# Patient Record
Sex: Male | Born: 1965 | Race: Black or African American | Hispanic: No | Smoking: Former smoker
Health system: Southern US, Community
[De-identification: ages and names within clinical notes are randomized; demographics above are authoritative.]

## PROBLEM LIST (undated history)

## (undated) DIAGNOSIS — I890 Lymphedema, not elsewhere classified: Secondary | ICD-10-CM

## (undated) DIAGNOSIS — I340 Nonrheumatic mitral (valve) insufficiency: Secondary | ICD-10-CM

## (undated) DIAGNOSIS — G4733 Obstructive sleep apnea (adult) (pediatric): Secondary | ICD-10-CM

## (undated) DIAGNOSIS — E785 Hyperlipidemia, unspecified: Secondary | ICD-10-CM

## (undated) DIAGNOSIS — Z954 Presence of other heart-valve replacement: Secondary | ICD-10-CM

## (undated) DIAGNOSIS — I4891 Unspecified atrial fibrillation: Secondary | ICD-10-CM

## (undated) DIAGNOSIS — J449 Chronic obstructive pulmonary disease, unspecified: Secondary | ICD-10-CM

## (undated) DIAGNOSIS — I252 Old myocardial infarction: Secondary | ICD-10-CM

## (undated) DIAGNOSIS — I87099 Postthrombotic syndrome with other complications of unspecified lower extremity: Secondary | ICD-10-CM

## (undated) DIAGNOSIS — I82409 Acute embolism and thrombosis of unspecified deep veins of unspecified lower extremity: Secondary | ICD-10-CM

## (undated) DIAGNOSIS — Z91030 Bee allergy status: Secondary | ICD-10-CM

## (undated) DIAGNOSIS — I5031 Acute diastolic (congestive) heart failure: Secondary | ICD-10-CM

## (undated) DIAGNOSIS — N179 Acute kidney failure, unspecified: Secondary | ICD-10-CM

## (undated) DIAGNOSIS — I2510 Atherosclerotic heart disease of native coronary artery without angina pectoris: Secondary | ICD-10-CM

## (undated) DIAGNOSIS — R911 Solitary pulmonary nodule: Secondary | ICD-10-CM

## (undated) HISTORY — DX: Atherosclerotic heart disease of native coronary artery without angina pectoris: I25.10

## (undated) HISTORY — DX: Postthrombotic syndrome with other complications of unspecified lower extremity: I87.099

## (undated) HISTORY — DX: Chronic obstructive pulmonary disease, unspecified: J44.9

## (undated) HISTORY — DX: Obstructive sleep apnea (adult) (pediatric): G47.33

## (undated) HISTORY — DX: Acute diastolic (congestive) heart failure: I50.31

## (undated) HISTORY — PX: COLONOSCOPY W/ BIOPSIES: SHX1374

## (undated) HISTORY — DX: Unspecified atrial fibrillation: I48.91

## (undated) HISTORY — DX: Hyperlipidemia, unspecified: E78.5

## (undated) HISTORY — DX: Bee allergy status: Z91.030

## (undated) HISTORY — DX: Solitary pulmonary nodule: R91.1

## (undated) HISTORY — DX: Nonrheumatic mitral (valve) insufficiency: I34.0

## (undated) HISTORY — DX: Acute embolism and thrombosis of unspecified deep veins of unspecified lower extremity: I82.409

## (undated) HISTORY — DX: Old myocardial infarction: I25.2

---

## 1998-03-28 ENCOUNTER — Encounter: Admission: RE | Admit: 1998-03-28 | Discharge: 1998-03-28 | Payer: Self-pay | Admitting: Internal Medicine

## 1998-10-31 ENCOUNTER — Encounter: Admission: RE | Admit: 1998-10-31 | Discharge: 1998-10-31 | Payer: Self-pay | Admitting: Internal Medicine

## 1998-12-05 ENCOUNTER — Encounter: Admission: RE | Admit: 1998-12-05 | Discharge: 1998-12-05 | Payer: Self-pay | Admitting: Internal Medicine

## 1999-07-24 ENCOUNTER — Encounter: Admission: RE | Admit: 1999-07-24 | Discharge: 1999-07-24 | Payer: Self-pay | Admitting: Internal Medicine

## 1999-12-07 ENCOUNTER — Encounter: Admission: RE | Admit: 1999-12-07 | Discharge: 1999-12-07 | Payer: Self-pay | Admitting: Internal Medicine

## 2000-01-23 ENCOUNTER — Emergency Department (HOSPITAL_COMMUNITY): Admission: EM | Admit: 2000-01-23 | Discharge: 2000-01-23 | Payer: Self-pay | Admitting: Emergency Medicine

## 2000-02-02 ENCOUNTER — Encounter: Admission: RE | Admit: 2000-02-02 | Discharge: 2000-02-02 | Payer: Self-pay | Admitting: Hematology and Oncology

## 2000-07-05 ENCOUNTER — Encounter: Admission: RE | Admit: 2000-07-05 | Discharge: 2000-07-05 | Payer: Self-pay | Admitting: Internal Medicine

## 2000-08-08 ENCOUNTER — Encounter: Admission: RE | Admit: 2000-08-08 | Discharge: 2000-08-08 | Payer: Self-pay | Admitting: Internal Medicine

## 2000-09-20 ENCOUNTER — Ambulatory Visit (HOSPITAL_COMMUNITY): Admission: RE | Admit: 2000-09-20 | Discharge: 2000-09-20 | Payer: Self-pay | Admitting: *Deleted

## 2000-11-28 ENCOUNTER — Encounter: Admission: RE | Admit: 2000-11-28 | Discharge: 2000-12-22 | Payer: Self-pay | Admitting: Internal Medicine

## 2001-06-23 ENCOUNTER — Encounter: Admission: RE | Admit: 2001-06-23 | Discharge: 2001-06-23 | Payer: Self-pay | Admitting: Internal Medicine

## 2001-06-30 ENCOUNTER — Ambulatory Visit (HOSPITAL_COMMUNITY): Admission: RE | Admit: 2001-06-30 | Discharge: 2001-06-30 | Payer: Self-pay | Admitting: Internal Medicine

## 2001-07-18 ENCOUNTER — Encounter: Admission: RE | Admit: 2001-07-18 | Discharge: 2001-07-18 | Payer: Self-pay | Admitting: Internal Medicine

## 2001-07-26 ENCOUNTER — Encounter: Admission: RE | Admit: 2001-07-26 | Discharge: 2001-07-26 | Payer: Self-pay | Admitting: Internal Medicine

## 2002-03-05 ENCOUNTER — Encounter: Admission: RE | Admit: 2002-03-05 | Discharge: 2002-03-05 | Payer: Self-pay | Admitting: Internal Medicine

## 2002-04-18 ENCOUNTER — Inpatient Hospital Stay (HOSPITAL_COMMUNITY): Admission: AD | Admit: 2002-04-18 | Discharge: 2002-04-20 | Payer: Self-pay | Admitting: Internal Medicine

## 2002-04-18 ENCOUNTER — Encounter: Admission: RE | Admit: 2002-04-18 | Discharge: 2002-04-18 | Payer: Self-pay | Admitting: Internal Medicine

## 2002-04-30 ENCOUNTER — Encounter: Admission: RE | Admit: 2002-04-30 | Discharge: 2002-04-30 | Payer: Self-pay | Admitting: Internal Medicine

## 2002-05-04 ENCOUNTER — Encounter: Admission: RE | Admit: 2002-05-04 | Discharge: 2002-07-17 | Payer: Self-pay | Admitting: Infectious Diseases

## 2002-11-12 ENCOUNTER — Encounter: Admission: RE | Admit: 2002-11-12 | Discharge: 2002-11-12 | Payer: Self-pay | Admitting: Internal Medicine

## 2003-11-21 ENCOUNTER — Encounter: Admission: RE | Admit: 2003-11-21 | Discharge: 2003-11-21 | Payer: Self-pay | Admitting: Internal Medicine

## 2004-03-02 ENCOUNTER — Encounter: Admission: RE | Admit: 2004-03-02 | Discharge: 2004-03-02 | Payer: Self-pay | Admitting: Internal Medicine

## 2004-07-24 ENCOUNTER — Encounter: Admission: RE | Admit: 2004-07-24 | Discharge: 2004-07-24 | Payer: Self-pay | Admitting: Internal Medicine

## 2005-01-22 ENCOUNTER — Ambulatory Visit: Payer: Self-pay | Admitting: Internal Medicine

## 2005-02-26 ENCOUNTER — Ambulatory Visit (HOSPITAL_BASED_OUTPATIENT_CLINIC_OR_DEPARTMENT_OTHER): Admission: RE | Admit: 2005-02-26 | Discharge: 2005-02-26 | Payer: Self-pay | Admitting: Internal Medicine

## 2005-03-07 ENCOUNTER — Ambulatory Visit: Payer: Self-pay | Admitting: Internal Medicine

## 2005-11-02 ENCOUNTER — Ambulatory Visit: Payer: Self-pay | Admitting: Internal Medicine

## 2006-03-10 ENCOUNTER — Ambulatory Visit: Payer: Self-pay | Admitting: Hospitalist

## 2006-04-04 ENCOUNTER — Ambulatory Visit (HOSPITAL_BASED_OUTPATIENT_CLINIC_OR_DEPARTMENT_OTHER): Admission: RE | Admit: 2006-04-04 | Discharge: 2006-04-04 | Payer: Self-pay | Admitting: General Surgery

## 2006-04-05 ENCOUNTER — Encounter (INDEPENDENT_AMBULATORY_CARE_PROVIDER_SITE_OTHER): Payer: Self-pay | Admitting: Specialist

## 2006-12-14 ENCOUNTER — Telehealth (INDEPENDENT_AMBULATORY_CARE_PROVIDER_SITE_OTHER): Payer: Self-pay | Admitting: Hospitalist

## 2006-12-14 DIAGNOSIS — F17200 Nicotine dependence, unspecified, uncomplicated: Secondary | ICD-10-CM

## 2006-12-14 DIAGNOSIS — G4733 Obstructive sleep apnea (adult) (pediatric): Secondary | ICD-10-CM

## 2006-12-14 DIAGNOSIS — I87099 Postthrombotic syndrome with other complications of unspecified lower extremity: Secondary | ICD-10-CM

## 2006-12-14 DIAGNOSIS — I872 Venous insufficiency (chronic) (peripheral): Secondary | ICD-10-CM | POA: Insufficient documentation

## 2006-12-14 DIAGNOSIS — E66811 Obesity, class 1: Secondary | ICD-10-CM | POA: Insufficient documentation

## 2006-12-14 DIAGNOSIS — E669 Obesity, unspecified: Secondary | ICD-10-CM

## 2006-12-14 DIAGNOSIS — L723 Sebaceous cyst: Secondary | ICD-10-CM

## 2006-12-14 HISTORY — DX: Postthrombotic syndrome with other complications of unspecified lower extremity: I87.099

## 2006-12-14 HISTORY — DX: Obstructive sleep apnea (adult) (pediatric): G47.33

## 2007-06-12 ENCOUNTER — Ambulatory Visit: Payer: Self-pay | Admitting: Hospitalist

## 2007-06-12 LAB — CONVERTED CEMR LAB
ALT: 14 units/L (ref 0–53)
AST: 16 units/L (ref 0–37)
Albumin: 3.9 g/dL (ref 3.5–5.2)
Alkaline Phosphatase: 61 units/L (ref 39–117)
BUN: 11 mg/dL (ref 6–23)
CO2: 22 meq/L (ref 19–32)
Calcium: 8.7 mg/dL (ref 8.4–10.5)
Chloride: 104 meq/L (ref 96–112)
Cholesterol: 174 mg/dL (ref 0–200)
Creatinine, Ser: 0.89 mg/dL (ref 0.40–1.50)
Glucose, Bld: 81 mg/dL (ref 70–99)
HDL: 42 mg/dL (ref >39)
LDL Cholesterol: 114 mg/dL — ABNORMAL HIGH (ref 0–99)
Potassium: 4.4 meq/L (ref 3.5–5.3)
Sodium: 139 meq/L (ref 135–145)
Total Bilirubin: 0.7 mg/dL (ref 0.3–1.2)
Total CHOL/HDL Ratio: 4.1
Total Protein: 6.3 g/dL (ref 6.0–8.3)
Triglycerides: 89 mg/dL (ref <150)
VLDL: 18 mg/dL (ref 0–40)

## 2008-07-31 ENCOUNTER — Telehealth: Payer: Self-pay | Admitting: Internal Medicine

## 2008-10-10 ENCOUNTER — Encounter (INDEPENDENT_AMBULATORY_CARE_PROVIDER_SITE_OTHER): Payer: Self-pay | Admitting: *Deleted

## 2008-10-10 ENCOUNTER — Ambulatory Visit: Payer: Self-pay | Admitting: *Deleted

## 2009-05-15 ENCOUNTER — Emergency Department (HOSPITAL_COMMUNITY): Admission: EM | Admit: 2009-05-15 | Discharge: 2009-05-15 | Payer: Self-pay | Admitting: Emergency Medicine

## 2010-02-27 ENCOUNTER — Ambulatory Visit: Payer: Self-pay | Admitting: Internal Medicine

## 2010-03-06 ENCOUNTER — Ambulatory Visit: Payer: Self-pay | Admitting: Internal Medicine

## 2010-03-09 LAB — CONVERTED CEMR LAB
Cholesterol: 180 mg/dL (ref 0–200)
HDL: 48 mg/dL (ref >39)
LDL Cholesterol: 123 mg/dL — ABNORMAL HIGH (ref 0–99)
Total CHOL/HDL Ratio: 3.8
Triglycerides: 44 mg/dL (ref <150)
VLDL: 9 mg/dL (ref 0–40)

## 2010-09-04 ENCOUNTER — Ambulatory Visit: Payer: Self-pay | Admitting: Internal Medicine

## 2010-12-31 NOTE — Assessment & Plan Note (Signed)
Summary: EST-CK/FU/MEDS/CFB   Vital Signs:  Patient profile:   45 year old male Height:      68 inches (172.72 cm) Weight:      270 pounds (122.73 kg) BMI:     41.20 Temp:     97.8 degrees F (36.56 degrees C) oral Pulse rate:   64 / minute BP sitting:   120 / 74  (right arm)  Vitals Entered By: Angelina Ok RN (September 04, 2010 11:22 AM) Is Patient Diabetic? No Pain Assessment Patient in pain? no      Nutritional Status BMI of > 30 = obese  Have you ever been in a relationship where you felt threatened, hurt or afraid?No   Does patient need assistance? Functional Status Self care Ambulation Normal Comments Check up.  Recheck leg.   Primary Care Provider:  Bethel Born MD   History of Present Illness: 45 y/o man wit hHLD,chiornic lymphedema with h/o cellutlits comes to the clinc for follow up  no cmplaints. has not had cellulitis in a while.   Depression History:      The patient denies a depressed mood most of the day and a diminished interest in his usual daily activities.         Preventive Screening-Counseling & Management  Alcohol-Tobacco     Smoking Status: current     Smoking Cessation Counseling: yes     Year Started: 2005 / one pack every two and half days  Comments: Cutting back.  Current Medications (verified): 1)  Futuro Firm Compression Hose  Misc (Elastic Bandages & Supports) .... Compression Stockings, One Medi Open Toe Calf, 40-50 Mm Hg Size 7 and One Medi Open Toe Calf Size 6, 30-40 Mm Hg 2)  Amoxicillin 875 Mg Tabs (Amoxicillin) .... Take One Pill Two Times A Day X 10 Days  Allergies (verified): No Known Drug Allergies  Review of Systems  The patient denies anorexia, fever, weight loss, weight gain, vision loss, decreased hearing, hoarseness, chest pain, syncope, dyspnea on exertion, peripheral edema, prolonged cough, headaches, hemoptysis, abdominal pain, melena, hematochezia, severe indigestion/heartburn, hematuria, incontinence,  genital sores, muscle weakness, suspicious skin lesions, transient blindness, difficulty walking, depression, unusual weight change, abnormal bleeding, enlarged lymph nodes, angioedema, breast masses, and testicular masses.    Physical Exam  General:  NAD Head:  normocephalic and atraumatic.   Ears:  External ear exam shows no significant lesions or deformities.  Otoscopic examination reveals clear canals, tympanic membranes are intact bilaterally without bulging, retraction, inflammation or discharge. Hearing is grossly normal bilaterally.R ear normal and L ear normal.   Nose:  no external deformity.   Neck:  No deformities, masses, or tenderness noted. Lungs:  Normal respiratory effort, chest expands symmetrically. Lungs are clear to auscultation, no crackles or wheezes. Heart:  Normal rate and regular rhythm. S1 and S2 normal without gallop, murmur, click, rub or other extra sounds. Abdomen:  Bowel sounds positive,abdomen soft and non-tender without masses, organomegaly or hernias noted. Msk:  No deformity or scoliosis noted of thoracic or lumbar spine.   Pulses:  R radial normal and L radial normal.   Extremities:  Left lower leg chronic skin changes. Dilated anterior vein on left shin. Slightly larger LLE than RLE. Swollen toes on left foot associated with prior DVT. No current erythema or palpable warmness.  has hose stocking on beth legs Neurologic:  alert & oriented X3, cranial nerves II-XII intact, strength normal in all extremities, sensation intact to light touch, sensation intact to pinprick,  and gait normal.     Impression & Recommendations:  Problem # 1:  OBSTRUCTIVE SLEEP APNEA (ICD-327.23) conitnue to wear the mask. no compaints  Problem # 2:  HYPERLIPIDEMIA (ICD-272.4) not any Rx. acceptable LDL.   plan -FLP next visit ( not fasting today) - diet control and lifestyle changes ( counselled)   Problem # 3:  LYMPHEDEMA (ICD-457.1) conitnue TED hose. no infection at  this time  Complete Medication List: 1)  Futuro Firm Compression Hose Misc (Elastic bandages & supports) .... Compression stockings, one medi open toe calf, 40-50 mm hg size 7 and one medi open toe calf size 6, 30-40 mm hg 2)  Amoxicillin 875 Mg Tabs (Amoxicillin) .... Take one pill two times a day x 10 days  Patient Instructions: 1)  Please schedule a follow-up appointment as needed. 2)  Stop Smoking Tips: Choose a Quit date. Cut down before the Quit date. decide what you will do as a substitute when you feel the urge to smoke(gum,toothpick,exercise). 3)  It is important that you exercise regularly at least 20 minutes 5 times a week. If you develop chest pain, have severe difficulty breathing, or feel very tired , stop exercising immediately and seek medical attention.  Prevention & Chronic Care Immunizations   Influenza vaccine: Historical  (11/02/2005)   Influenza vaccine deferral: Deferred  (02/27/2010)    Tetanus booster: Not documented   Td booster deferral: Refused  (09/04/2010)    Pneumococcal vaccine: Not documented  Other Screening   Smoking status: current  (09/04/2010)   Smoking cessation counseling: yes  (09/04/2010)  Lipids   Total Cholesterol: 180  (03/06/2010)   LDL: 123  (03/06/2010)   LDL Direct: Not documented   HDL: 48  (03/06/2010)   Triglycerides: 44  (03/06/2010)    SGOT (AST): 16  (06/12/2007)   SGPT (ALT): 14  (06/12/2007)   Alkaline phosphatase: 61  (06/12/2007)   Total bilirubin: 0.7  (06/12/2007)    Lipid flowsheet reviewed?: Yes   Progress toward LDL goal: Unchanged  Self-Management Support :   Personal Goals (by the next clinic visit) :      Personal LDL goal: 100  (02/27/2010)    Patient will work on the following items until the next clinic visit to reach self-care goals:     Medications and monitoring: take my medicines every day, bring all of my medications to every visit  (09/04/2010)     Eating: drink diet soda or water instead of  juice or soda, eat more vegetables, use fresh or frozen vegetables, eat foods that are low in salt, eat baked foods instead of fried foods, eat fruit for snacks and desserts, limit or avoid alcohol  (09/04/2010)     Activity: take a 30 minute walk every day  (09/04/2010)    Lipid self-management support: Written self-care plan, Education handout, Pre-printed educational material, Resources for patients handout  (09/04/2010)   Lipid self-care plan printed.   Lipid education handout printed      Resource handout printed.   Nursing Instructions: Give Flu vaccine today     Vital Signs:  Patient profile:   45 year old male Height:      68 inches (172.72 cm) Weight:      270 pounds (122.73 kg) BMI:     41.20 Temp:     97.8 degrees F (36.56 degrees C) oral Pulse rate:   64 / minute BP sitting:   120 / 74  (right arm)  Vitals Entered By: Venita Sheffield  Herbin RN (September 04, 2010 11:22 AM)

## 2010-12-31 NOTE — Assessment & Plan Note (Signed)
Summary: ACUTE-OUT OF MEDICATIONS/NEEDS NEEDS NEW MASK FOR CI PAP MACH...   Vital Signs:  Patient profile:   45 year old male Height:      68 inches (172.72 cm) Weight:      276.2 pounds (123.41 kg) BMI:     42.15 Temp:     96.9 degrees F (36.06 degrees C) oral Pulse rate:   61 / minute BP sitting:   123 / 75  (right arm) Cuff size:   LAREGE  Vitals Entered By: Theotis Barrio NT II (February 27, 2010 2:41 PM) CC: MEDICATION REFILL   /  NEED RX FOR TED HOSE FOR A YEAR Is Patient Diabetic? No Pain Assessment Patient in pain? no      Nutritional Status BMI of > 30 = obese  Have you ever been in a relationship where you felt threatened, hurt or afraid?No   Does patient need assistance? Functional Status Self care Ambulation Normal Comments MEDICATION REFILL AND NEED RX FOR TED HOSE FOR A YEAR   Primary Care Provider:  Bethel Born MD  CC:  MEDICATION REFILL   /  NEED RX FOR TED HOSE FOR A YEAR.  History of Present Illness: 45 yr old is here after long hiatrus for his yearly prescriptions. No complains.  Reports no problem with cellulitis.  He has cut down on smoking but not ready to quit. He is going on vacation to [place] [employer] and he likes to have antibiotic with him in case his cellulitis flares up. He also needs his yearly supply of stocking for leg edema.   Preventive Screening-Counseling & Management  Alcohol-Tobacco     Smoking Status: current     Smoking Cessation Counseling: yes     Year Started: 2005 / one pack every two and half days  Caffeine-Diet-Exercise     Does Patient Exercise: yes     Type of exercise: mowing /walking     Times/week: 2  Current Medications (verified): 1)  Futuro Firm Compression Hose  Misc (Elastic Bandages & Supports) .... Compression Stockings, One Medi Open Toe Calf, 40-50 Mm Hg Size 7 and One Medi Open Toe Calf Size 6, 30-40 Mm Hg 2)  Amoxicillin 875 Mg Tabs (Amoxicillin) .... Take One Pill Two Times A Day X 10 Days  Allergies  (verified): No Known Drug Allergies  Past History:  Past Medical History: Last updated: 12/14/2006 DVT, hx of - left leg - 1993 -  s/p recurrent cellulitis left leg -  s/p chronic lymphedema -  wears compression stockings Hyperlipidemia- diet controlled right foot callus tinea pedis -recurrent. Uses clotrimazole 1%BID x 14-21 days when recurs tobacco abuse- quit 2000 but relapsed and now one pack every 2 1/2 d. Couldn't afford Wellbutrin in past obesity- Ht 5'8 and wt 260- BMI 39.5 obstructive sleep apnea on CPAP sebaceous/epidermoid cyst anterior chest wall 1.5cm- [facility], [facility] 4/07  Risk Factors: Exercise: yes (02/27/2010)  Risk Factors: Smoking Status: current (02/27/2010)  Review of Systems      See HPI  Physical Exam  General:  NAD Head:  normocephalic and atraumatic.   Eyes:  No corneal or conjunctival inflammation noted. EOMI. Perrla.  Vision grossly normal. Ears:  External ear exam shows no significant lesions or deformities.  Otoscopic examination reveals clear canals, tympanic membranes are intact bilaterally without bulging, retraction, inflammation or discharge. Hearing is grossly normal bilaterally. Mouth:  Oral mucosa and oropharynx without lesions or exudates.  Teeth in good repair. Neck:  No deformities, masses, or tenderness  noted. Lungs:  Normal respiratory effort, chest expands symmetrically. Lungs are clear to auscultation, no crackles or wheezes. Heart:  Normal rate and regular rhythm. S1 and S2 normal without gallop, murmur, click, rub or other extra sounds. Abdomen:  Bowel sounds positive,abdomen soft and non-tender without masses, organomegaly or hernias noted. Msk:  No deformity or scoliosis noted of thoracic or lumbar spine.   Extremities:  Left lower leg chronic skin changes. Dilated anterior vein on left shin. Slightly larger LLE than RLE. Swollen toes on left foot associated with prior DVT. No current erythema or palpable warmness. Skin:  erythema  and papular rash particularly over the left side of his back and on his left arm where he was stung by hornets. No sign of infection.  Psych:  Cognition and judgment appear intact. Alert and cooperative with normal attention span and concentration. No apparent delusions, illusions, hallucinations   Impression & Recommendations:  Problem # 1:  CELLULITIS, LEG, LEFT (ICD-682.6) will give amoxicillin prescription for "in case of cellulitis" while away on vacation needs. NO sings of ongoing cellulitis right now.  His updated medication list for this problem includes:    Amoxicillin 875 Mg Tabs (Amoxicillin) .Marland Kitchen... Take one pill two times a day x 10 days  Problem # 2:  TOBACCO ABUSE (ICD-305.1) Assessment: Improved could not afford chantix. Never used. He smokes 8 cig a day. He known he needs to quit cold [place]. but he is not ready.  The following medications were removed from the medication list:    Chantix Starting Month Pak 0.5 Mg X 11 & 1 Mg X 42 Misc (Varenicline tartrate)    Chantix Continuing Month Pak 1 Mg Tabs (Varenicline tartrate)  Problem # 3:  OBSTRUCTIVE SLEEP APNEA (ICD-327.23) wears mask every day. going to refit on this week.   Problem # 4:  HYPERLIPIDEMIA (ICD-272.4) will recheck ldl, he may need to go on statin if still elevated.  Future Orders: T-Lipid Profile [phone]) ... 03/06/2010  Problem # 5:  OBESITY (ICD-278.00) talked about various complication related to obesity and methods to decrease it. He voices understanding.   Complete Medication List: 1)  Futuro Firm Compression Hose Misc (Elastic bandages & supports) .... Compression stockings, one medi open toe calf, 40-50 mm hg size 7 and one medi open toe calf size 6, 30-40 mm hg 2)  Amoxicillin 875 Mg Tabs (Amoxicillin) .... Take one pill two times a day x 10 days  Patient Instructions: 1)  Please schedule a follow-up appointment in 6 months. 2)  Get your fasting lipid panel done on coming friday. Dont eat  or drink any food after midnight of thursday.  3)  Tobacco is very bad for your health and your loved ones! You Should stop smoking!. 4)  Stop Smoking Tips: Choose a Quit date. Cut down before the Quit date. decide what you will do as a substitute when you feel the urge to smoke(gum,toothpick,exercise). Prescriptions: FUTURO FIRM COMPRESSION HOSE  MISC (ELASTIC BANDAGES & SUPPORTS) compression stockings, one Medi open toe calf, 40-50 mm Hg size 7 and one Medi open toe calf size 6, 30-40 mm Hg  #1 yr supply x 0   Entered and Authorized by:   Clerance Lav MD   Signed by:   Clerance Lav MD on 02/27/2010   Method used:   Print then Give to Patient   RxID:   [id number] AMOXICILLIN 875 MG TABS (AMOXICILLIN) Take one pill two times a day x 10 days  #20 x 1  Entered and Authorized by:   Clerance Lav MD   Signed by:   Clerance Lav MD on 02/27/2010   Method used:   Electronically to        [facility] [address] [phone]* (retail)       [address]       [place], [place]  [address]       Ph: [phone] or [phone]       Fax: [phone]   RxID:   [id number] FUTURO FIRM COMPRESSION HOSE  MISC (ELASTIC BANDAGES & SUPPORTS) compression stockings, one Medi open toe calf, 40-50 mm Hg size 7 and one Medi open toe calf size 6, 30-40 mm Hg  #1 yr supply x 0   Entered and Authorized by:   Clerance Lav MD   Signed by:   Clerance Lav MD on 02/27/2010   Method used:   Faxed to ...       [facility] [address] [phone]* (retail)       [address]       [place], [place]  [address]       Ph: [phone] or [phone]       Fax: [phone]   RxID:   [id number]  Process Orders Check Orders Results:     [facility]: ABN not required for this insurance Tests Sent for requisitioning (February 27, 2010 4:44 PM):     03/06/2010: [facility] -- T-Lipid Profile [id number] (signed)    Prevention & Chronic  Care Immunizations   Influenza vaccine: Historical  (11/02/2005)   Influenza vaccine deferral: Deferred  (02/27/2010)    Tetanus booster: Not documented   Td booster deferral: Deferred  (02/27/2010)    Pneumococcal vaccine: Not documented  Other Screening   Smoking status: current  (02/27/2010)   Smoking cessation counseling: yes  (02/27/2010)  Lipids   Total Cholesterol: 174  (06/12/2007)   LDL: 114  (06/12/2007)   LDL Direct: Not documented   HDL: 42  (06/12/2007)   Triglycerides: 89  (06/12/2007)    SGOT (AST): 16  (06/12/2007)   SGPT (ALT): 14  (06/12/2007)   Alkaline phosphatase: 61  (06/12/2007)   Total bilirubin: 0.7  (06/12/2007)    Lipid flowsheet reviewed?: Yes   Progress toward LDL goal: Unchanged  Self-Management Support :   Personal Goals (by the next clinic visit) :      Personal LDL goal: 100  (02/27/2010)    Patient will work on the following items until the next clinic visit to reach self-care goals:     Eating: drink diet soda or water instead of juice or soda, eat more vegetables, use fresh or frozen vegetables, eat foods that are low in salt, eat baked foods instead of fried foods, eat fruit for snacks and desserts, limit or avoid alcohol  (02/27/2010)     Activity: take a 30 minute walk every day  (02/27/2010)    Lipid self-management support: Resources for patients handout, Written self-care plan  (02/27/2010)   Lipid self-care plan printed.      Resource handout printed.

## 2011-01-11 ENCOUNTER — Encounter: Payer: Self-pay | Admitting: Internal Medicine

## 2011-04-16 NOTE — Op Note (Signed)
NAME:  Oscar Brown, Oscar Brown NO.:  [id number]   MEDICAL RECORD NO.:  [id number]          PATIENT TYPE:  AMB   LOCATION:  DSC                          FACILITY:  [facility]   PHYSICIAN:  Angelia Mould. Derrell Lolling, M.D.DATE OF BIRTH:  November 02, 1966   DATE OF PROCEDURE:  04/04/2006  DATE OF DISCHARGE:                                 OPERATIVE REPORT      Angelia Mould. Derrell Lolling, M.D.  Electronically Signed     HMI/MEDQ  D:  04/04/2006  T:  04/05/2006  Job:  [id number]

## 2011-04-16 NOTE — Op Note (Signed)
NAME:  Oscar Brown, Oscar Brown NO.:  [id number]   MEDICAL RECORD NO.:  [id number]          PATIENT TYPE:  AMB   LOCATION:  DSC                          FACILITY:  [facility]   PHYSICIAN:  Angelia Mould. Derrell Lolling, M.D.DATE OF BIRTH:  November 27, 1966   DATE OF PROCEDURE:  04/04/2006  DATE OF DISCHARGE:                                 OPERATIVE REPORT   PREOPERATIVE DIAGNOSIS:  A 1.5 cm soft tissue mass of the right anterior  chest wall.   POSTOPERATIVE DIAGNOSIS:  A 1.5 cm soft tissue mass of the right anterior  chest wall.   OPERATION PERFORMED:  Excision of a 1.5 cm soft tissue mass, right anterior  chest wall.   SURGEON:  Angelia Mould. Derrell Lolling, M.D.   ANESTHESIA:  Local.   INDICATIONS FOR PROCEDURE:  The patient is a 45 year old black man who has  developed an enlarging soft tissue mass of the right anterior chest wall  overlying the pectoralis major muscle.  It is becoming irritated, but has  never become infected.  On exam, this is a 1.5 cm smooth, rounded, firm,  cystic-appearing lesion on the anterior chest wall consistent with an  epidermoid cyst.  He wanted to have this excised and he was brought to the  operating room electively.   DESCRIPTION OF PROCEDURE:  The patient was brought to the minor procedure  room at [facility]. He was placed supine on the operating  table and the right chest was prepped and draped in sterile fashion.  1%  Xylocaine with epinephrine was used as local infiltration anesthetic.  A  transverse elliptical incision was made and the soft tissue mass was  dissected away from the surrounding subcutaneous adipose tissue without any  problem.  The mass was sent for routine histology.  Hemostasis was  excellent.  The wound was irrigated with saline.  The skin was closed with  multiple interrupted sutures of 3-0 and 4-0 nylon.  Clean bandages were  placed and the patient was taken to recovery room in stable condition.   ESTIMATED BLOOD  LOSS:  About 2 mL.   COMPLICATIONS:  None.   Sponge, needle and instrument counts were correct.      Angelia Mould. Derrell Lolling, M.D.  Electronically Signed     HMI/MEDQ  D:  04/04/2006  T:  04/05/2006  Job:  [id number]   cc:   Eliseo Gum, M.D.  Fax: [phone]

## 2011-04-16 NOTE — Discharge Summary (Signed)
[facility]  Patient:    Oscar Brown, Oscar Brown Visit Number: [id number] MRN: [id number]          Service Type: MED Location: [id number] Attending Physician:  Karn Pickler Dictated by:   Jennette Kettle, M.D. Admit Date:  04/18/2002 Discharge Date: 04/20/2002                             Discharge Summary  DISCHARGE DIAGNOSES: 1. Left lower extremity cellulitis. 2. History of chronic left edema, left greater than right dating back    to the 1980s, likely predisposing the patient to #1 above. 3. History of deep venous thrombosis in the left lower extremity approximately    two years ago. 4. History of recurrent cellulitis in the left lower extremity approximately    two years ago. 5. History of left lower leg fracture as a teenager. 6. History of right wrist fracture as a teenager. 7. Leukocytosis secondary to #1 during current hospitalization.  DISCHARGE MEDICATIONS: 1. Augmentin 875 mg 1 tablet p.o. b.i.d. to complete a 10-day course. 2. Tylenol 325 mg 1-2 tablets q.6h. p.r.n. pain.  FOLLOWUP:  The patient has a followup appointment with his primary care doctor, Dr. Debara Pickett, on Monday, June 2, at 2 p.m.  At this time, further re-evaluation of the patients left lower extremity cellulitis/lymphedema.  If Dr. Sharl Ma is not available, the patient may be seen in [facility] with Dr. Jennette Kettle.  Follow up the patients pending blood cultures at the time of discharge.  PROCEDURES/DIAGNOSTIC STUDIES:  On Apr 18, 2002, the patient had lower extremity Dopplers which were negative for DVT, SVT, or Baker cyst.  He was noted to have an incidental finding of a superficial inguinal lymph node enlargement in the groin area.  ADMISSION HISTORY AND PHYSICAL:  Briefly, Oscar Brown is a 45 year old African American male with a history of chronic lymphedema, recurrent DVTs, left lower extremity cellulitis, who presented on May 21, to the [facility] with a 3-4  day history of increased swelling, erythema, and warmth associated with subjective fever at home.  The patient denied any nausea, vomiting, headache, vision changes, abdominal discomfort, or trauma to his lower extremity.  The patient had no associated chest pain or shortness of breath.  The patient did suggest he had positive chills at home.  The has a history of a broken left lower extremity fracture as a teenager, but no recent trauma to that area.  ADMISSION LABORATORIES:  The patients CBC on admission revealed a white count of 26.3, hemoglobin 14.5, platelets of 166 with a left shift at 95% neutrophils with an absolute neutrophil count of 24.9.  The patients electrolytes revealed a sodium of 137, potassium 3.6, chloride 102, bicarb 25, glucose 116, BUN 8, creatinine 1.3.  The patients LFTs revealed a bilirubin 0.9, alkaline phosphatase 53, AST 24, ALT 18, protein 6.0, albumin 3.5, calcium 9.2.  The patient did have blood cultures at the time of admission x2, which were subsequently negative, yet pending evaluation.  HOSPITAL COURSE: #1 - LEFT LOWER EXTREMITY CELLULITIS:  The patient was initially admitted from the [facility] for further treatment of the patients cellulitis.  The patient is nOT a diabetic.  The patient did have lower extremity Dopplers performed with the results being negative for DVT.  The patient was placed on Ancef and later changed to p.o. Augmentin prior to discharge.  The patient did have a white blood cell count elevated  at 26,300 with a left shift.  Blood cultures were obtained x2.  These were subsequently negative, yet pending final evaluation. The patients CBC, at the time of discharge, had a decreased white cell count of 13,700.  The patient initially had a temperature upon admission to the hospital at 102.7.  This later defervesced and the patient was afebrile for 24-48 hours prior to discharge.  The patient was able to ambulate without any assistance.  The  patient was discharged home on Augmentin with followup in the internal medicine clinic with the patient primary care doctor.  If the patients primary care doctor is not available, he may be seen in [facility] by Dr. Erich Montane.  DISCHARGE LABORATORIES:  The patient had electrolytes, at the time of discharge, revealing sodium 140, potassium 3.9, chloride 106, bicarb 27, glucose 113, BUN 9, creatinine 1.0, calcium of 9.3.  The patients CBC revealed a white count of 13.7, hemoglobin 14.4, platelet count of 153. Dictated by:   Jennette Kettle, M.D. Attending Physician:  Karn Pickler DD:  04/20/02 TD:  04/24/02 Job: [id number]

## 2011-04-16 NOTE — Procedures (Signed)
NAME:  Oscar Brown, Oscar Brown NO.:  [id number]   MEDICAL RECORD NO.:  [id number]          PATIENT TYPE:  OUT   LOCATION:  SLEEP CENTER                 FACILITY:  [facility]   PHYSICIAN:  Clinton D. Maple Hudson, M.D. DATE OF BIRTH:  02-23-1966   DATE OF STUDY:  02/26/2005                              NOCTURNAL POLYSOMNOGRAM   DATE OF STUDY:  February 26, 2005   REFERRING PHYSICIAN:  Dr. Lonia Blood   INDICATION FOR STUDY:  Hypersomnia with sleep apnea.  Epworth Sleepiness  Score 14/24, BMI 39, weight 258 pounds.   SLEEP ARCHITECTURE:  Total sleep time 398 minutes with sleep efficiency 90%.  Stage I was 9%, stage II 65%, stages III and IV 7%, REM was 19% of total  sleep time.  Sleep latency 25 minutes, REM latency 131 minutes.  Awake after  sleep onset 18 minutes, arousal index 49.   RESPIRATORY DATA:  Split-study protocol.  Respiratory disturbance index  (RDI) 19.2 obstructive events per hour indicating severe obstructive sleep  apnea/hypopnea before CPAP.  This included 234 obstructive apneas and 17  hypopneas before CPAP.  Events were not positional.  REM RDI 12.5.  CPAP was  titrated to 12 CWP.  Reviewing tracings it appears that 11 CWP is probably  adequate with less potential for nasal congestion.  At 11 CWP the RDI was  7.4 per hour.  A medium ResMed ComfortGel Mask was used with heated  humidifier.   OXYGEN DATA:  Very loud snoring with oxygen desaturation to a nadir of 75%  before CPAP.  After CPAP control oxygen saturation held 94-98% on room air.   CARDIAC DATA:  Sinus rhythm with sinus bradycardia occasionally into the  40s.   MOVEMENT/PARASOMNIA:  A total of 56 limb jerks were recorded of which 26  were associated with arousal or awakening for a periodic limb movement with  arousal index of 3.9 per hour which is mildly increased.   IMPRESSION/RECOMMENDATION:  1.  Severe obstructive sleep apnea/hypopnea syndrome, respiratory      disturbance index 19.2 per hour with  loud snoring and oxygen      desaturation to 75%.  2.  Successful continuous positive airway pressure titration to 11 CWP,      respiratory disturbance index 7.4 per hour, using a medium ResMed      ComfortGel mask with heated humidifier.  3.  Sinus rhythm with sinus bradycardia.  4.  Mild periodic limb movement with arousal, 3.9 per hour.      CDY/MEDQ  D:  03/07/2005 09:42:41  T:  03/07/2005 11:48:48  Job:  [id number]

## 2011-12-31 ENCOUNTER — Encounter: Payer: Self-pay | Admitting: Internal Medicine

## 2012-01-03 ENCOUNTER — Encounter: Payer: Self-pay | Admitting: Internal Medicine

## 2012-01-03 ENCOUNTER — Ambulatory Visit (INDEPENDENT_AMBULATORY_CARE_PROVIDER_SITE_OTHER): Payer: Self-pay | Admitting: Internal Medicine

## 2012-01-03 VITALS — BP 127/77 | HR 56 | Temp 97.8°F | Ht 68.0 in | Wt 261.5 lb

## 2012-01-03 DIAGNOSIS — I890 Lymphedema, not elsewhere classified: Secondary | ICD-10-CM

## 2012-01-03 DIAGNOSIS — F17200 Nicotine dependence, unspecified, uncomplicated: Secondary | ICD-10-CM

## 2012-01-03 DIAGNOSIS — E789 Disorder of lipoprotein metabolism, unspecified: Secondary | ICD-10-CM

## 2012-01-03 DIAGNOSIS — E785 Hyperlipidemia, unspecified: Secondary | ICD-10-CM

## 2012-01-03 MED ORDER — FUTURO FIRM COMPRESSION HOSE MISC: Status: DC

## 2012-01-03 NOTE — Patient Instructions (Signed)
Follow up in 8-12 months Will call you with blood work results if something is abnormal. If you do not hear from me, assume everything is ok

## 2012-01-03 NOTE — Assessment & Plan Note (Signed)
Smoking cessation advised. Patient is currently in the process of quitting. Nicotine alternatives offered but refused at this visit. Continue to encourage

## 2012-01-03 NOTE — Progress Notes (Signed)
Patient ID: Oscar Brown, male   DOB: 11-25-1966, 46 y.o.   MRN: [id number]  46 y/o m with history of chronic venous insufficiency in the left leg after a DVT in 1993, hyperlipidemia, obstructive sleep apnea on CPAP and smoking comes to the clinic for followup. No new complaints today. Requests a refill on compression stocking prescription.  Physical exam   General Appearance:     Filed Vitals:   01/03/12 1459  BP: 127/77  Pulse: 56  Temp: 97.8 F (36.6 C)  TempSrc: Oral  Height: 5\' 8"  (1.727 m)  Weight: 261 lb 8 oz (118.616 kg)  SpO2: 99%     Alert, cooperative, no distress, appears stated age  Head:    Normocephalic, without obvious abnormality, atraumatic  Eyes:    PERRL, conjunctiva/corneas clear, EOM's intact, fundi    benign, both eyes       Neck:   Supple, symmetrical, trachea midline, no adenopathy;       thyroid:  No enlargement/tenderness/nodules; no carotid   bruit or JVD  Lungs:     Clear to auscultation bilaterally, respirations unlabored  Chest wall:    No tenderness or deformity  Heart:    Regular rate and rhythm, S1 and S2 normal, no murmur, rub   or gallop  Abdomen:     Soft, non-tender, bowel sounds active all four quadrants,    no masses, no organomegaly  Extremities:   compression stockings in bilateral extremity with minimal edema at around the knee area. no skin breakdown   Pulses:   2+ and symmetric all extremities  Skin:   Skin color, texture, turgor normal, no rashes or lesions  Neurologic:  nonfocal grossly  Review of system Constitutional: Denies fever, chills, diaphoresis, appetite change and fatigue.  HEENT: Denies photophobia, eye pain, redness, hearing loss, ear pain, congestion, sore throat, rhinorrhea, sneezing, mouth sores, trouble swallowing, neck pain, neck stiffness and tinnitus.   Respiratory: Denies SOB, DOE, cough, chest tightness,  and wheezing.   Cardiovascular: Denies chest pain, palpitations and leg swelling.  Gastrointestinal:  Denies nausea, vomiting, abdominal pain, diarrhea, constipation, blood in stool and abdominal distention.  Genitourinary: Denies dysuria, urgency, frequency, hematuria, flank pain and difficulty urinating.  Musculoskeletal: Denies myalgias, back pain, joint swelling, arthralgias and gait problem.  Skin: Denies pallor, rash and wound.  Neurological: Denies dizziness, seizures, syncope, weakness, light-headedness, numbness and headaches.  Hematological: Denies adenopathy. Easy bruising, personal or family bleeding history  Psychiatric/Behavioral: Denies suicidal ideation, mood changes, confusion, nervousness, sleep disturbance and agitation

## 2012-01-03 NOTE — Assessment & Plan Note (Addendum)
Check lipid profile and LFTs(if need to start on statin). No treatment currently.

## 2012-01-03 NOTE — Assessment & Plan Note (Signed)
Well controlled  Continue current treatment plan 

## 2012-01-04 LAB — COMPREHENSIVE METABOLIC PANEL
ALT: 10 U/L (ref 0–53)
AST: 15 U/L (ref 0–37)
Albumin: 4 g/dL (ref 3.5–5.2)
Alkaline Phosphatase: 60 U/L (ref 39–117)
BUN: 15 mg/dL (ref 6–23)
CO2: 21 mEq/L (ref 19–32)
Calcium: 9.4 mg/dL (ref 8.4–10.5)
Chloride: 107 mEq/L (ref 96–112)
Creat: 0.85 mg/dL (ref 0.50–1.35)
Glucose, Bld: 86 mg/dL (ref 70–99)
Potassium: 4.1 mEq/L (ref 3.5–5.3)
Sodium: 139 mEq/L (ref 135–145)
Total Bilirubin: 0.5 mg/dL (ref 0.3–1.2)
Total Protein: 6.2 g/dL (ref 6.0–8.3)

## 2012-01-04 LAB — LIPID PANEL
Cholesterol: 155 mg/dL (ref 0–200)
HDL: 42 mg/dL (ref >39)
LDL Cholesterol: 102 mg/dL — ABNORMAL HIGH (ref 0–99)
Total CHOL/HDL Ratio: 3.7 Ratio
Triglycerides: 55 mg/dL (ref <150)
VLDL: 11 mg/dL (ref 0–40)

## 2012-03-29 ENCOUNTER — Telehealth: Payer: Self-pay | Admitting: *Deleted

## 2012-03-29 DIAGNOSIS — G4733 Obstructive sleep apnea (adult) (pediatric): Secondary | ICD-10-CM

## 2012-03-29 NOTE — Telephone Encounter (Signed)
Pt is asking for a Rx for a new CPAP machine.  His machine is making a popping sound and will not lite up.  He brough his machine to [facility] and they need a Rx so insurance will cover the cost.  Pt uses [facility] I need to fax rx in  Pt # [phone]

## 2012-10-06 ENCOUNTER — Encounter: Payer: Self-pay | Admitting: Internal Medicine

## 2012-10-06 ENCOUNTER — Ambulatory Visit (INDEPENDENT_AMBULATORY_CARE_PROVIDER_SITE_OTHER): Payer: 59 | Admitting: Internal Medicine

## 2012-10-06 VITALS — BP 135/86 | HR 61 | Temp 97.6°F | Wt 259.9 lb

## 2012-10-06 DIAGNOSIS — Z23 Encounter for immunization: Secondary | ICD-10-CM

## 2012-10-06 DIAGNOSIS — G4733 Obstructive sleep apnea (adult) (pediatric): Secondary | ICD-10-CM

## 2012-10-06 DIAGNOSIS — I059 Rheumatic mitral valve disease, unspecified: Secondary | ICD-10-CM

## 2012-10-06 DIAGNOSIS — I340 Nonrheumatic mitral (valve) insufficiency: Secondary | ICD-10-CM

## 2012-10-06 DIAGNOSIS — I82409 Acute embolism and thrombosis of unspecified deep veins of unspecified lower extremity: Secondary | ICD-10-CM

## 2012-10-06 DIAGNOSIS — E669 Obesity, unspecified: Secondary | ICD-10-CM

## 2012-10-06 DIAGNOSIS — Z0000 Encounter for general adult medical examination without abnormal findings: Secondary | ICD-10-CM | POA: Insufficient documentation

## 2012-10-06 DIAGNOSIS — L0390 Cellulitis, unspecified: Secondary | ICD-10-CM

## 2012-10-06 DIAGNOSIS — I890 Lymphedema, not elsewhere classified: Secondary | ICD-10-CM

## 2012-10-06 HISTORY — DX: Nonrheumatic mitral (valve) insufficiency: I34.0

## 2012-10-06 MED ORDER — FUTURO FIRM COMPRESSION HOSE MISC: 1.0000 [IU] | Freq: Every day | Status: DC

## 2012-10-06 MED ORDER — AMOXICILLIN-POT CLAVULANATE 875-125 MG PO TABS: 1.0000 | ORAL_TABLET | Freq: Two times a day (BID) | ORAL | Status: AC

## 2012-10-06 NOTE — Assessment & Plan Note (Signed)
His chronic left lower extremity edema is stable. He does an excellent job managing his disease by consistent use of his compression stockings. We will continue with the current prescription for his compression stockings.

## 2012-10-06 NOTE — Assessment & Plan Note (Signed)
This was found incidentally on exam today. He denies ever being told he has had a murmur in the past. As it is a 2/6 murmur and there are no additional heart sounds we will follow this clinically. If he were to develop any changes in the cardiac exam in the future or any symptoms that would be consistent with mitral regurgitation, we would refer him for an echocardiogram at that time.

## 2012-10-06 NOTE — Assessment & Plan Note (Signed)
Oscar Brown was given his annual flu exam today.

## 2012-10-06 NOTE — Assessment & Plan Note (Signed)
He states he is compliant with his nocturnal nasal CPAP and that his symptoms are well-controlled with this therapy. We will continue the nocturnal nasal CPAP at 11 cm H2O and follow his symptoms.

## 2012-10-06 NOTE — Assessment & Plan Note (Signed)
He has slowly been losing weight over the last year and a half. He was congratulated on his progress and was encouraged to continue working on his diet.

## 2012-10-06 NOTE — Patient Instructions (Signed)
It was nice to meet you.  I look forward to taking care of you for years to come  1) I renewed your amoxicillin in case you have a flare in your leg.  2) I rewrote for a new set of compression stockings.  3) We gave you a flu shot today.  4) I will see you in 1 year. Sooner if necessary.

## 2012-10-06 NOTE — Progress Notes (Signed)
  Subjective:    Patient ID: Oscar Brown, male    DOB: 12-25-1965, 46 y.o.   MRN: [id number]  HPI  Oscar Brown presents today for his annual visit. He is without any acute complaints. He requests that I write a new prescription for his compression stockings at 40-50 mmHg, size 7 for the left leg and 30-40 mmHg size 6 for the right leg. He also requests a prescription for Augmentin 875 mg by mouth twice a day in case he were to have a flare of his recurrent left lower extremity cellulitis.  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 He has not had any recent episodes of cellulitis or recurrent deep venous thrombosis.  Obstructive sleep apnea He states he is compliant with his nocturnal nasal CPAP at 11 cm H2O. The symptoms are well-controlled.  Obesity He's been watching his diet and his weight has actually decreased by 16 pounds over the last year and a half. He was encouraged to continue this slow progressive weight loss.  Tobacco abuse Oscar Brown successfully quit smoking one month ago. He was congratulated on this great feat and was encouraged to continue smoking cessation as it has a positive impact on his health. He was asked if he had any cravings or with nicotine withdrawal. He denied any such symptoms and was not interested in any pharmacologic therapy at this time.  Preventive health care Oscar Brown was interested in getting a flu vaccine today.  Review of Systems  Constitutional: Negative for activity change, appetite change and unexpected weight change.  Respiratory: Negative for apnea, chest tightness and shortness of breath.   Cardiovascular: Positive for leg swelling. Negative for chest pain and palpitations.  Gastrointestinal: Negative for abdominal pain.  Musculoskeletal: Negative for myalgias, back pain and arthralgias.  Skin: Negative for rash and wound.  Psychiatric/Behavioral:  Negative for behavioral problems, confusion, sleep disturbance, dysphoric mood and agitation. The patient is not nervous/anxious.       Objective:   Physical Exam  Nursing note and vitals reviewed. Constitutional: He is oriented to person, place, and time. He appears well-developed and well-nourished. No distress.  HENT:  Head: Normocephalic and atraumatic.  Eyes: Conjunctivae normal are normal. No scleral icterus.  Neck: Normal range of motion. Neck supple. No thyromegaly present.  Cardiovascular: Normal rate and regular rhythm.  Exam reveals no gallop and no friction rub.   Murmur heard.  Systolic murmur is present with a grade of 2/6       Murmur at LLSB radiating to the axilla consistent with a murmur of mitral regurgitation  Pulmonary/Chest: Effort normal and breath sounds normal. No respiratory distress. He has no wheezes. He has no rales.  Abdominal: Soft. Bowel sounds are normal. He exhibits no distension. There is no tenderness. There is no rebound and no guarding.  Musculoskeletal: Normal range of motion. He exhibits edema. He exhibits no tenderness.  Neurological: He is alert and oriented to person, place, and time. He exhibits normal muscle tone. Coordination normal.  Skin: Skin is warm and dry. No rash noted. He is not diaphoretic. No erythema.  Psychiatric: He has a normal mood and affect. His behavior is normal. Judgment and thought content normal.      Assessment & Plan:   Please see problem oriented charting.

## 2014-06-04 ENCOUNTER — Emergency Department (HOSPITAL_COMMUNITY)
Admission: EM | Admit: 2014-06-04 | Discharge: 2014-06-05 | Disposition: A | Discharge status: Home / Self Care | Payer: Managed Care, Other (non HMO) | Attending: Emergency Medicine | Admitting: Emergency Medicine

## 2014-06-04 ENCOUNTER — Encounter (HOSPITAL_COMMUNITY): Payer: Self-pay | Admitting: Emergency Medicine

## 2014-06-04 DIAGNOSIS — Z86718 Personal history of other venous thrombosis and embolism: Secondary | ICD-10-CM | POA: Insufficient documentation

## 2014-06-04 DIAGNOSIS — Z862 Personal history of diseases of the blood and blood-forming organs and certain disorders involving the immune mechanism: Secondary | ICD-10-CM | POA: Insufficient documentation

## 2014-06-04 DIAGNOSIS — N453 Epididymo-orchitis: Secondary | ICD-10-CM | POA: Insufficient documentation

## 2014-06-04 DIAGNOSIS — N452 Orchitis: Secondary | ICD-10-CM

## 2014-06-04 DIAGNOSIS — Z9981 Dependence on supplemental oxygen: Secondary | ICD-10-CM | POA: Insufficient documentation

## 2014-06-04 DIAGNOSIS — Z79899 Other long term (current) drug therapy: Secondary | ICD-10-CM | POA: Insufficient documentation

## 2014-06-04 DIAGNOSIS — Z8639 Personal history of other endocrine, nutritional and metabolic disease: Secondary | ICD-10-CM | POA: Insufficient documentation

## 2014-06-04 DIAGNOSIS — Z87891 Personal history of nicotine dependence: Secondary | ICD-10-CM | POA: Insufficient documentation

## 2014-06-04 DIAGNOSIS — G4733 Obstructive sleep apnea (adult) (pediatric): Secondary | ICD-10-CM | POA: Insufficient documentation

## 2014-06-04 DIAGNOSIS — K4090 Unilateral inguinal hernia, without obstruction or gangrene, not specified as recurrent: Secondary | ICD-10-CM | POA: Insufficient documentation

## 2014-06-04 MED ORDER — OXYCODONE-ACETAMINOPHEN 5-325 MG PO TABS
1.0000 | ORAL_TABLET | Freq: Once | ORAL | Status: AC
  Administered 2014-06-04: 1 via ORAL
  Filled 2014-06-04: qty 1

## 2014-06-04 NOTE — ED Notes (Signed)
Patient here with complaint of right groin pain with correlating swelling of scrotum. Patient states that it first occurred last week. The groin pain started and then the right side of is scrotum became swollen and tender. The swelling relieved without intervention after a day or so. The pain has returned at this time and patient states that his scrotum feels like its beginning to swell again.

## 2014-06-05 ENCOUNTER — Emergency Department (HOSPITAL_COMMUNITY): Payer: Managed Care, Other (non HMO)

## 2014-06-05 LAB — URINALYSIS, ROUTINE W REFLEX MICROSCOPIC
BILIRUBIN URINE: NEGATIVE
GLUCOSE, UA: NEGATIVE mg/dL
HGB URINE DIPSTICK: NEGATIVE
KETONES UR: NEGATIVE mg/dL
LEUKOCYTES UA: NEGATIVE
Nitrite: NEGATIVE
PH: 7 (ref 5.0–8.0)
PROTEIN: NEGATIVE mg/dL
Specific Gravity, Urine: 1.021 (ref 1.005–1.030)
Urobilinogen, UA: 0.2 mg/dL (ref 0.0–1.0)

## 2014-06-05 MED ORDER — HYDROCODONE-ACETAMINOPHEN 5-325 MG PO TABS: 1.0000 | ORAL_TABLET | ORAL | Status: DC | PRN

## 2014-06-05 MED ORDER — CEFTRIAXONE SODIUM 250 MG IJ SOLR
250.0000 mg | Freq: Once | INTRAMUSCULAR | Status: AC
Start: 2014-06-05 — End: 2014-06-05
  Administered 2014-06-05: 250 mg via INTRAMUSCULAR
  Filled 2014-06-05: qty 250

## 2014-06-05 MED ORDER — LIDOCAINE HCL (PF) 1 % IJ SOLN
2.0000 mL | Freq: Once | INTRAMUSCULAR | Status: AC
  Administered 2014-06-05: 2 mL
  Filled 2014-06-05: qty 5

## 2014-06-05 MED ORDER — DOXYCYCLINE HYCLATE 100 MG PO CAPS: 100.0000 mg | ORAL_CAPSULE | Freq: Two times a day (BID) | ORAL | Status: DC

## 2014-06-05 NOTE — ED Provider Notes (Signed)
CSN: [id number]     Arrival date & time 06/04/14  2044 History   First MD Initiated Contact with Patient 06/04/14 2359     Chief Complaint  Patient presents with  . Groin Pain    (Consider location/radiation/quality/duration/timing/severity/associated sxs/prior Treatment) HPI Comments: 48 year old male with a history of DVT and OSA presents to the emergency department for right groin swelling. Patient states that he noticed a swelling in his right hemiscrotum last week. He states that this swelling improved 2 days ago, but worsened yesterday. Symptoms associated with tenderness to his right testicle. Patient states that pain is worse with activity and palpation to his right hemiscrotum. Patient denies associated fever, abdominal pain, nausea or vomiting, scrotal redness, penile redness or swelling, penile discharge, dysuria or hematuria, or rashes.  Patient is a 48 y.o. male presenting with groin pain. The history is provided by the patient. No language interpreter was used.  Groin Pain This is a new problem. Episode onset: 1 week ago. Resolved 2 days ago, but worsened again today. The problem has been gradually worsening. Pertinent negatives include no abdominal pain, chest pain, diaphoresis, fever, nausea, rash, urinary symptoms or vomiting. Exacerbated by: palpation to R hemiscrotum. He has tried nothing for the symptoms.    Past Medical History  Diagnosis Date  . DVT (deep venous thrombosis)     1993, s/p chronic lymphadenopathy  . Obstructive sleep apnea     on CPAP  . Lipidemia    History reviewed. No pertinent past surgical history. History reviewed. No pertinent family history. History  Substance Use Topics  . Smoking status: Former Smoker -- 0.10 packs/day    Types: Cigarettes    Quit date: 09/05/2012  . Smokeless tobacco: Never Used     Comment: slowing down on his own  . Alcohol Use: No    Review of Systems  Constitutional: Negative for fever and diaphoresis.   Cardiovascular: Negative for chest pain.  Gastrointestinal: Negative for nausea, vomiting and abdominal pain.  Skin: Negative for rash.     Allergies  Review of patient's allergies indicates no known allergies.  Home Medications   Prior to Admission medications   Medication Sig Start Date End Date Taking? Authorizing Provider  Elastic Bandages & Supports (FUTURO FIRM COMPRESSION HOSE) MISC 1 Units by Percutaneous route daily. One medi open toe cal 40-50 mm Hg size 7 and one medi open toe calf 30-40 mmg Hg, sizxe 6 10/06/12  Yes Karren Cobble, MD  doxycycline (VIBRAMYCIN) 100 MG capsule Take 1 capsule (100 mg total) by mouth 2 (two) times daily. 06/05/14   Antonietta Breach, PA-C  HYDROcodone-acetaminophen (NORCO/VICODIN) 5-325 MG per tablet Take 1 tablet by mouth every 4 (four) hours as needed for moderate pain or severe pain. 06/05/14   Antonietta Breach, PA-C   BP 120/77  Pulse 49  Temp(Src) 99 F (37.2 C) (Oral)  Resp 18  SpO2 99%  Physical Exam  Nursing note and vitals reviewed. Constitutional: He is oriented to person, place, and time. He appears well-developed and well-nourished. No distress.  Nontoxic/nonseptic appearing  HENT:  Head: Normocephalic and atraumatic.  Eyes: Conjunctivae and EOM are normal. No scleral icterus.  Neck: Normal range of motion.  Cardiovascular: Normal rate, regular rhythm and intact distal pulses.   Pulses:      Radial pulses are 2+ on the right side.  Pulmonary/Chest: Effort normal. No respiratory distress. He has no wheezes.  Abdominal: Soft. There is no tenderness. There is no rebound and no guarding.  A hernia is present. Hernia confirmed positive in the right inguinal area. Hernia confirmed negative in the left inguinal area.  Soft obese abdomen without tenderness, peritoneal signs, or masses.  Genitourinary: Penis normal. Right testis shows swelling and tenderness. Right testis is descended. Left testis shows no mass, no swelling and no tenderness. Left  testis is descended. Uncircumcised. No penile tenderness. No discharge found.  Chaperoned GU exam significant for enlarged R testicle with associated TTP. No redness, induration, or heat to touch.  Musculoskeletal: Normal range of motion.  Neurological: He is alert and oriented to person, place, and time. He exhibits normal muscle tone. Coordination normal.  GCS 15. Patient moves extremities without ataxia.  Skin: Skin is warm and dry. No rash noted. He is not diaphoretic. No erythema. No pallor.  Psychiatric: He has a normal mood and affect. His behavior is normal.    ED Course  Procedures (including critical care time) Labs Review Labs Reviewed  URINALYSIS, ROUTINE W REFLEX MICROSCOPIC    Imaging Review US Scrotum  06/05/2014   CLINICAL DATA:  Right-sided groin pain and scrotal swelling.  EXAM: ULTRASOUND OF SCROTUM  TECHNIQUE: Complete ultrasound examination of the testicles, epididymis, and other scrotal structures was performed.  COMPARISON:  None.  FINDINGS: Right testicle  Measurements: 4.9 x 2.7 x 3.8 cm. Diffusely heterogeneous and hypervascular appearance to the right testis. No discrete fluid collection or mass is identified. However, orchitis or infiltrating tumor should be excluded.  Left testicle  Measurements: 4.8 x 2.6 x 2.4 cm. No mass or microlithiasis visualized.  Right epididymis:  Normal in size and appearance.  Left epididymis:  Normal in size and appearance.  Hydrocele:  Small right hydrocele.  Varicocele:  Small bilateral varicoceles are present.  Heterogeneous mobile structure in the right inguinal canal increasing with Valsalva. This is consistent with right inguinal hernia containing bowel.  IMPRESSION: 1. Heterogeneous hypervascular appearance to the right testis. This could represent infiltrating tumor or orchitis. Correlation with physical examination is recommended. If this is clinically consistent with orchitis, recommend followup after treatment of the acute process.  Otherwise, infiltrating process or neoplasm should be considered. 2. Right inguinal hernia containing bowel. 3. Small bilateral varicoceles.  Small right hydrocele.   Electronically Signed   By: Lucienne Capers M.D.   On: 06/05/2014 01:12   [place] Art/ven Flow Abd Pelv Doppler  06/05/2014   CLINICAL DATA:  Right-sided groin pain and scrotal swelling.  EXAM: ULTRASOUND OF SCROTUM  TECHNIQUE: Complete ultrasound examination of the testicles, epididymis, and other scrotal structures was performed.  COMPARISON:  None.  FINDINGS: Right testicle  Measurements: 4.9 x 2.7 x 3.8 cm. Diffusely heterogeneous and hypervascular appearance to the right testis. No discrete fluid collection or mass is identified. However, orchitis or infiltrating tumor should be excluded.  Left testicle  Measurements: 4.8 x 2.6 x 2.4 cm. No mass or microlithiasis visualized.  Right epididymis:  Normal in size and appearance.  Left epididymis:  Normal in size and appearance.  Hydrocele:  Small right hydrocele.  Varicocele:  Small bilateral varicoceles are present.  Heterogeneous mobile structure in the right inguinal canal increasing with Valsalva. This is consistent with right inguinal hernia containing bowel.  IMPRESSION: 1. Heterogeneous hypervascular appearance to the right testis. This could represent infiltrating tumor or orchitis. Correlation with physical examination is recommended. If this is clinically consistent with orchitis, recommend followup after treatment of the acute process. Otherwise, infiltrating process or neoplasm should be considered. 2. Right inguinal hernia containing bowel.  3. Small bilateral varicoceles.  Small right hydrocele.   Electronically Signed   By: Lucienne Capers M.D.   On: 06/05/2014 01:12     EKG Interpretation None      MDM   Final diagnoses:  Orchitis of right testicle  Unilateral inguinal hernia without obstruction or gangrene, recurrence not specified    48 year old male presents to the  emergency department for right groin pain and swelling of his right hemiscrotum. Patient with testicular tenderness and testicular swelling on chaperoned GU exam. Urinalysis today does not suggest infection. Scrotal ultrasound ordered for further evaluation. Ultrasound today shows hypervascular heterogeneous appearance of the right testes. DDx includes infiltrating tumor vs orchitis. Right inguinal hernia containing bowel also seen. No evidence of bowel strangulation; area nonindurated, nonerythematous, and nontender.  Given tenderness of testicle, will treat patient for orchitis as pain makes tumor less likely. Patient treated in ED with Rocephin and he will be discharged with prescription for doxycycline. Will also refer to urology for followup. Referral to general surgery given for hernia. Return precautions provided. Patient agreeable to plan with no unaddressed concerns.   Filed Vitals:   06/04/14 2057 06/04/14 2351 06/05/14 0105 06/05/14 0200  BP: 143/89 118/81 110/72 120/77  Pulse: 70 58 56 49  Temp: 99 F (37.2 C)     TempSrc: Oral     Resp: 18 18    SpO2: 99% 99% 98% 99%       Antonietta Breach, PA-C 06/05/14 [phone]

## 2014-06-05 NOTE — ED Notes (Signed)
Pt taken to ulrasound

## 2014-06-05 NOTE — ED Provider Notes (Signed)
Medical screening examination/treatment/procedure(s) were conducted as a shared visit with non-physician practitioner(s) and myself.  I personally evaluated the patient during the encounter.   EKG Interpretation None     Pt with right sided scrotal pain, swelling.  Inguinal hernia noted, right testicular pain/swelling.  U/s with concerns for orchitis vs infiltrating mass.  Pt with tenderness to palpation of right testicle, suspect orchitis.  Will need close f/u with urology and surgery.    Kalman Drape, MD 06/05/14 [phone]

## 2014-06-05 NOTE — ED Notes (Signed)
Pt states he had a pain in his groin that felt like he pulled something, or had a cyst.  Pt states that pain went away but returned today.  Pt states no changes in urinary habits

## 2014-06-05 NOTE — Discharge Instructions (Signed)
Orchitis Orchitis is an infection of the testicle of usually sudden onset (happens quickly). It may be viral or bacterial (caused by germs). Usually with this illness there is generalized malaise (not feeling well) and fever. There is also pain. There is usually tenderness and swelling of the scrotum and testicle. DIAGNOSIS  Your caregiver will perform an exam to make sure there is not another reason for the pain in your testicle. A rectal exam may be done to find out if the prostate is swollen and tender. Blood work may be done to see if your white blood cell count is elevated. This can help determine if an infection is viral or bacterial. A urinalysis can also determine what type of infection is present. Most bacterial infections can be treated with antibiotics (medications which kill germs). LET YOUR CAREGIVER KNOW ABOUT:  Allergies.  Medications taken including herbs, eye drops, over the counter medications, and creams.  Use of steroids (by mouth or creams).  Previous problems with anesthetics or novocaine.  Previous prostate infections.  History of blood clots (thrombophlebitis).  History of bleeding or blood problems.  Previous surgery.  Previous urinary tract infection.  Other health problems. HOME CARE INSTRUCTIONS   Apply cold packs to the scrotal area for twenty minutes, four times per day or as needed.  A scrotal support may be helpful. Keep a small pillow or support under your testicles while lying or sitting down.  Only take over-the-counter or prescription medicines for pain, discomfort, or fever as directed by your caregiver.  Take all medications, including antibiotics, as directed. Take the antibiotics for the full prescribed length of time even if you are feeling better. SEEK IMMEDIATE MEDICAL CARE IF:   Your redness, swelling, or pain in the testicle increases or is not getting better.  You have a fever.  You have pain not relieved with medicines.  You  have any worsening of any symptoms (problems) that originally brought you in for medical care. Document Released: 11/12/2000 Document Revised: 02/07/2012 Document Reviewed: 11/15/2005 [facility] Patient Information 2015 [place], [place]. This information is not intended to replace advice given to you by your health care provider. Make sure you discuss any questions you have with your health care provider.  Hernia A hernia happens when an organ inside your body pushes out through a weak spot in your belly (abdominal) wall. Most hernias get worse over time. They can often be pushed back into place (reduced). Surgery may be needed to repair hernias that cannot be pushed into place. HOME CARE  Keep doing normal activities.  Avoid lifting more than 10 pounds (4.5 kilograms).  Cough gently and avoid straining. Over time, these things will:  Increase your hernia size.  Irritate your hernia.  Break down hernia repairs.  Stop smoking.  Do not wear anything tight over your hernia. Do not keep the hernia in with an outside bandage.  Eat food that is high in fiber (fruit, vegetables, whole grains).  Drink enough fluids to keep your pee (urine) clear or pale yellow.  Take medicines to make your poop soft (stool softeners) if you cannot poop (constipated). GET HELP RIGHT AWAY IF:   You have a fever.  You have belly pain that gets worse.  You feel sick to your stomach (nauseous) and throw up (vomit).  Your skin starts to bulge out.  Your hernia turns a different color, feels hard, or is tender.  You have increased pain or puffiness (swelling) around the hernia.  You poop more  or less often.  Your poop does not look the way normally does.  You have watery poop (diarrhea).  You cannot push the hernia back in place by applying gentle pressure while lying down. MAKE SURE YOU:   Understand these instructions.  Will watch your condition.  Will get help right away if you are not doing  well or get worse. Document Released: 05/05/2010 Document Revised: 02/07/2012 Document Reviewed: 05/05/2010 [facility] Patient Information 2015 [place], [place]. This information is not intended to replace advice given to you by your health care provider. Make sure you discuss any questions you have with your health care provider.

## 2014-06-21 ENCOUNTER — Encounter (INDEPENDENT_AMBULATORY_CARE_PROVIDER_SITE_OTHER): Payer: Self-pay | Admitting: General Surgery

## 2014-06-21 ENCOUNTER — Ambulatory Visit (INDEPENDENT_AMBULATORY_CARE_PROVIDER_SITE_OTHER): Payer: Managed Care, Other (non HMO) | Admitting: General Surgery

## 2014-06-21 VITALS — BP 122/71 | HR 72 | Temp 98.2°F | Resp 16 | Ht 68.0 in | Wt 259.2 lb

## 2014-06-21 DIAGNOSIS — N453 Epididymo-orchitis: Secondary | ICD-10-CM

## 2014-06-21 DIAGNOSIS — N452 Orchitis: Secondary | ICD-10-CM

## 2014-06-21 DIAGNOSIS — K4090 Unilateral inguinal hernia, without obstruction or gangrene, not specified as recurrent: Secondary | ICD-10-CM

## 2014-06-21 NOTE — Progress Notes (Signed)
Patient ID: Oscar Brown, male   DOB: [birth year], 48 y.o.   MRN: [id number]  Chief Complaint  Patient presents with  . Hernia    HPI Oscar Brown is a 48 y.o. male.   HPI 47 year old obese African American male referred by Dr. Sharol Given for evaluation of a possible right inguinal hernia. The patient presented to the emergency room earlier this month with right scrotal swelling and pain. He underwent testicular ultrasound which revealed findings consistent with a probable hernia as well as inflammation of the testicle but could not rule out a testicular mass. Because of the pain and tenderness on exam he was diagnosed with orchitis. He saw Dr. Junious Silk at [facility] urology and was found to have a normal CBC and comprehensive metabolic panel. His urinalysis in the emergency room was normal. He apparently also underwent CT scan of his abdomen and pelvis on the 16th. I have the report only. There is no evidence of an inguinal hernia or lymphadenopathy. It apparently just showed an enlarged right testicle. He denies noticing any lump in his pubic area. He has had one prior episode of scrotal discomfort that he describes as a flare up. Past Medical History  Diagnosis Date  . DVT (deep venous thrombosis)     1993, s/p chronic lymphadenopathy  . Obstructive sleep apnea     on CPAP  . Lipidemia     History reviewed. No pertinent past surgical history.  History reviewed. No pertinent family history.  Social History History  Substance Use Topics  . Smoking status: Former Smoker -- 0.10 packs/day    Types: Cigarettes    Quit date: 09/05/2012  . Smokeless tobacco: Never Used     Comment: slowing down on his own  . Alcohol Use: No    No Known Allergies  No current outpatient prescriptions on file.   No current facility-administered medications for this visit.    Review of Systems Review of Systems  Constitutional: Negative for fever, chills, appetite change and unexpected weight  change.  HENT: Negative for congestion and trouble swallowing.   Eyes: Negative for visual disturbance.  Respiratory: Negative for chest tightness and shortness of breath.        +osa on cpap  Cardiovascular: Negative for chest pain and leg swelling.       No PND, no orthopnea, no DOE  Gastrointestinal: Negative for nausea, vomiting, abdominal pain, diarrhea, constipation and blood in stool.       See HPI  Genitourinary: Positive for scrotal swelling and testicular pain. Negative for dysuria, frequency, hematuria and difficulty urinating.  Musculoskeletal: Negative.   Skin: Negative for rash.  Neurological: Negative for seizures and speech difficulty.  Hematological: Does not bruise/bleed easily.  Psychiatric/Behavioral: Negative for behavioral problems and confusion.    Blood pressure 122/71, pulse 72, temperature 98.2 F (36.8 C), temperature source Oral, resp. rate 16, height 5\' 8"  (1.727 m), weight 259 lb 3.2 oz (117.572 kg).  Physical Exam Physical Exam  Vitals reviewed. Constitutional: He is oriented to person, place, and time. He appears well-developed and well-nourished. No distress.  obese  HENT:  Head: Normocephalic and atraumatic.  Right Ear: External ear normal.  Left Ear: External ear normal.  Eyes: Conjunctivae are normal. No scleral icterus.  Neck: Normal range of motion. Neck supple. No tracheal deviation present. No thyromegaly present.  Cardiovascular: Normal rate, normal heart sounds and intact distal pulses.   Pulmonary/Chest: Effort normal and breath sounds normal. No respiratory distress. He has no wheezes.  Abdominal: Soft. He exhibits no distension. There is no tenderness. There is no rebound and no guarding. Hernia confirmed negative in the left inguinal area.  Genitourinary: Penis normal.  Right testicle slightly larger than Left; feels like small bag of worms superior to Rt testicle -? Varicocele. Examined supine/standing with&without valsalva - small  impulse on palpation of deep R ring with valsalva. No obvious scrotal/testicular masses   Musculoskeletal: Normal range of motion. He exhibits no edema and no tenderness.  Lymphadenopathy:    He has no cervical adenopathy.       Right: No inguinal adenopathy present.       Left: No inguinal adenopathy present.  Neurological: He is alert and oriented to person, place, and time. He exhibits normal muscle tone.  Skin: Skin is warm and dry. No rash noted. He is not diaphoretic. No erythema. No pallor.  Psychiatric: He has a normal mood and affect. His behavior is normal. Judgment and thought content normal.    Data Reviewed ED note [facility] note  Assessment    History of right  Orchitis Asymmetric testicles Very small possible right inguinal hernia     Plan    On physical exam I really do not appreciate any significant inguinal hernia. There is perhaps a small early hernia on the right but I can only be appreciated on deep palpation of the ring with a strong Valsalva maneuver. Therefore have not recommended hernia repair at this time. I believe more his symptoms are consistent with his testicle. We discussed hernias. He was given Neurosurgeon. I recommended reexam in 6 months. We discussed signs and symptoms of what he should be calling me for. Followup in 6 months or sooner if he develops more symptomatic inguinal hernia. We discussed the importance of weight loss  Leighton Ruff. Redmond Pulling, MD, FACS General, Bariatric, & Minimally Invasive Surgery [facility], [place]        [facility] 06/21/2014, 4:51 PM

## 2014-06-21 NOTE — Patient Instructions (Signed)
You have a very small inguinal hernia I will bring you back in 6 months for a recheck  If you notice increase swelling in your pubic area or worsening pain, please call  Hernia A hernia occurs when an internal organ pushes out through a weak spot in the abdominal wall. Hernias most commonly occur in the groin and around the navel. Hernias often can be pushed back into place (reduced). Most hernias tend to get worse over time. Some abdominal hernias can get stuck in the opening (irreducible or incarcerated hernia) and cannot be reduced. An irreducible abdominal hernia which is tightly squeezed into the opening is at risk for impaired blood supply (strangulated hernia). A strangulated hernia is a medical emergency. Because of the risk for an irreducible or strangulated hernia, surgery may be recommended to repair a hernia. CAUSES   Heavy lifting.  Prolonged coughing.  Straining to have a bowel movement.  A cut (incision) made during an abdominal surgery. HOME CARE INSTRUCTIONS   Bed rest is not required. You may continue your normal activities.  Cough gently. If you are a smoker it is best to stop. Even the best hernia repair can break down with the continual strain of coughing. Even if you do not have your hernia repaired, a cough will continue to aggravate the problem.  Do not wear anything tight over your hernia. Do not try to keep it in with an outside bandage or truss. These can damage abdominal contents if they are trapped within the hernia sac.  Eat a normal diet.  Avoid constipation. Straining over long periods of time will increase hernia size and encourage breakdown of repairs. If you cannot do this with diet alone, stool softeners may be used. SEEK IMMEDIATE MEDICAL CARE IF:   You have a fever.  You develop increasing abdominal pain.  You feel nauseous or vomit.  Your hernia is stuck outside the abdomen, looks discolored, feels hard, or is tender.  You have any changes in  your bowel habits or in the hernia that are unusual for you.  You have increased pain or swelling around the hernia.  You cannot push the hernia back in place by applying gentle pressure while lying down. MAKE SURE YOU:   Understand these instructions.  Will watch your condition.  Will get help right away if you are not doing well or get worse. Document Released: 11/15/2005 Document Revised: 02/07/2012 Document Reviewed: 07/04/2008 [facility] Patient Information 2015 [place], [place]. This information is not intended to replace advice given to you by your health care provider. Make sure you discuss any questions you have with your health care provider.

## 2014-07-19 ENCOUNTER — Ambulatory Visit (INDEPENDENT_AMBULATORY_CARE_PROVIDER_SITE_OTHER): Payer: Managed Care, Other (non HMO) | Admitting: Internal Medicine

## 2014-07-19 ENCOUNTER — Encounter: Payer: Self-pay | Admitting: Internal Medicine

## 2014-07-19 VITALS — BP 130/78 | HR 67 | Temp 97.8°F | Wt 245.4 lb

## 2014-07-19 DIAGNOSIS — I059 Rheumatic mitral valve disease, unspecified: Secondary | ICD-10-CM

## 2014-07-19 DIAGNOSIS — Z23 Encounter for immunization: Secondary | ICD-10-CM

## 2014-07-19 DIAGNOSIS — E669 Obesity, unspecified: Secondary | ICD-10-CM

## 2014-07-19 DIAGNOSIS — I872 Venous insufficiency (chronic) (peripheral): Secondary | ICD-10-CM

## 2014-07-19 DIAGNOSIS — Z0000 Encounter for general adult medical examination without abnormal findings: Secondary | ICD-10-CM

## 2014-07-19 DIAGNOSIS — I340 Nonrheumatic mitral (valve) insufficiency: Secondary | ICD-10-CM

## 2014-07-19 DIAGNOSIS — G4733 Obstructive sleep apnea (adult) (pediatric): Secondary | ICD-10-CM

## 2014-07-19 NOTE — Assessment & Plan Note (Signed)
Once again a 2/6 mitral regurgitant murmur was heard at the apex radiating to the axilla. There was no S3 or S4 gallop. As his heart appears to be otherwise structurally intact and it is a 2/6 systolic mitral regurgitant murmur we will continue to follow clinically. Were he to develop signs or symptoms suggesting structural changes we would then proceed to echocardiogram.

## 2014-07-19 NOTE — Progress Notes (Signed)
   Subjective:    Patient ID: Oscar Brown, male    DOB: Jul 03, 1966, 48 y.o.   MRN: [id number]  HPI  Please see the A&P for the status of the pt's chronic medical problems.  Review of Systems  Constitutional: Negative for appetite change and unexpected weight change.  Respiratory: Negative for chest tightness, shortness of breath and wheezing.   Cardiovascular: Negative for chest pain.  Gastrointestinal: Negative for abdominal pain.  Musculoskeletal: Negative for arthralgias and back pain.  Skin: Negative for color change, rash and wound.      Objective:   Physical Exam  Nursing note and vitals reviewed. Constitutional: He is oriented to person, place, and time. He appears well-developed and well-nourished. No distress.  HENT:  Head: Normocephalic and atraumatic.  Eyes: Conjunctivae are normal. Right eye exhibits no discharge. Left eye exhibits no discharge. No scleral icterus.  Cardiovascular: Normal rate and regular rhythm.  Exam reveals no gallop and no friction rub.   Murmur heard. II/VI systolic murmur best heard at the apex and radiating to the axilla.  There were no gallops.  Exam unchanged from the previous cardiac examination.  Pulmonary/Chest: Effort normal and breath sounds normal. No respiratory distress. He has no wheezes. He has no rales.  Abdominal: Soft. Bowel sounds are normal. He exhibits no distension. There is no tenderness. There is no rebound and no guarding.  Musculoskeletal: Normal range of motion. He exhibits no edema and no tenderness.  Neurological: He is alert and oriented to person, place, and time. He exhibits normal muscle tone.  Skin: Skin is warm and dry. No rash noted. He is not diaphoretic. No erythema.  Psychiatric: He has a normal mood and affect. His behavior is normal. Judgment and thought content normal.      Assessment & Plan:   Please see problem oriented charting.

## 2014-07-19 NOTE — Assessment & Plan Note (Signed)
He states he's been compliant with his nocturnal nasal CPAP at 11 cm of water and has had no daytime somnolence or morning headaches. We will therefore continue with this therapy.

## 2014-07-19 NOTE — Assessment & Plan Note (Signed)
He continues to remain active and monitor his portion sizes. This has resulted in an additional 14 pound weight loss over the last year and a half. He was praised for this progress and was encouraged to continue remaining active and watching his diet.

## 2014-07-19 NOTE — Assessment & Plan Note (Signed)
His chronic venous insufficiency has been well controlled with the compression stockings which he uses faithfully. He has had no episodes of cellulitis in his lower extremities since I last saw him one and half years ago. I provided him with a renewal prescription for compression stockings of 40-50 mmHg bilaterally.

## 2014-07-19 NOTE — Assessment & Plan Note (Signed)
He received the seasonal influenza vaccination as well as the T/dap today. He is otherwise up-to-date on his preventative health measures.

## 2014-07-19 NOTE — Patient Instructions (Signed)
It was great to see you again.  Great work on your [employer] and quitting smoking!  1) Keep doing what you are doing with regards to exercise, diet, and portion control.  2) We gave you the flu shot and the Tdap shot today.  3) I gave you a prescription for your compression stockings.  I will see you back in 1 1/2 year, sooner if necessary.

## 2014-07-30 ENCOUNTER — Other Ambulatory Visit (HOSPITAL_COMMUNITY): Payer: Self-pay | Admitting: Urology

## 2014-07-30 DIAGNOSIS — D4959 Neoplasm of unspecified behavior of other genitourinary organ: Secondary | ICD-10-CM

## 2014-08-12 ENCOUNTER — Ambulatory Visit (HOSPITAL_COMMUNITY)
Admission: RE | Admit: 2014-08-12 | Discharge: 2014-08-12 | Disposition: A | Discharge status: Home / Self Care | Payer: Managed Care, Other (non HMO) | Source: Ambulatory Visit | Attending: Urology | Admitting: Urology

## 2014-08-12 DIAGNOSIS — D4959 Neoplasm of unspecified behavior of other genitourinary organ: Secondary | ICD-10-CM | POA: Diagnosis not present

## 2014-12-10 ENCOUNTER — Other Ambulatory Visit (HOSPITAL_COMMUNITY): Payer: Self-pay | Admitting: Urology

## 2014-12-10 DIAGNOSIS — D4959 Neoplasm of unspecified behavior of other genitourinary organ: Secondary | ICD-10-CM

## 2014-12-12 ENCOUNTER — Ambulatory Visit (HOSPITAL_COMMUNITY)
Admission: RE | Admit: 2014-12-12 | Discharge: 2014-12-12 | Disposition: A | Discharge status: Home / Self Care | Payer: Managed Care, Other (non HMO) | Source: Ambulatory Visit | Attending: Urology | Admitting: Urology

## 2014-12-12 DIAGNOSIS — D495 Neoplasm of unspecified behavior of other genitourinary organs: Secondary | ICD-10-CM | POA: Insufficient documentation

## 2014-12-12 DIAGNOSIS — D4959 Neoplasm of unspecified behavior of other genitourinary organ: Secondary | ICD-10-CM

## 2014-12-12 DIAGNOSIS — N433 Hydrocele, unspecified: Secondary | ICD-10-CM | POA: Insufficient documentation

## 2014-12-12 DIAGNOSIS — I861 Scrotal varices: Secondary | ICD-10-CM | POA: Diagnosis not present

## 2015-05-22 ENCOUNTER — Emergency Department (HOSPITAL_COMMUNITY)
Admission: EM | Admit: 2015-05-22 | Discharge: 2015-05-22 | Disposition: A | Discharge status: Home / Self Care | Payer: Managed Care, Other (non HMO) | Attending: Emergency Medicine | Admitting: Emergency Medicine

## 2015-05-22 ENCOUNTER — Encounter (HOSPITAL_COMMUNITY): Payer: Self-pay | Admitting: *Deleted

## 2015-05-22 DIAGNOSIS — Z87891 Personal history of nicotine dependence: Secondary | ICD-10-CM | POA: Diagnosis not present

## 2015-05-22 DIAGNOSIS — Y998 Other external cause status: Secondary | ICD-10-CM | POA: Insufficient documentation

## 2015-05-22 DIAGNOSIS — Y9389 Activity, other specified: Secondary | ICD-10-CM | POA: Insufficient documentation

## 2015-05-22 DIAGNOSIS — Y92410 Unspecified street and highway as the place of occurrence of the external cause: Secondary | ICD-10-CM | POA: Diagnosis not present

## 2015-05-22 DIAGNOSIS — S29012A Strain of muscle and tendon of back wall of thorax, initial encounter: Secondary | ICD-10-CM | POA: Insufficient documentation

## 2015-05-22 DIAGNOSIS — E669 Obesity, unspecified: Secondary | ICD-10-CM | POA: Insufficient documentation

## 2015-05-22 DIAGNOSIS — S199XXA Unspecified injury of neck, initial encounter: Secondary | ICD-10-CM | POA: Diagnosis present

## 2015-05-22 DIAGNOSIS — Z9981 Dependence on supplemental oxygen: Secondary | ICD-10-CM | POA: Insufficient documentation

## 2015-05-22 DIAGNOSIS — Z86718 Personal history of other venous thrombosis and embolism: Secondary | ICD-10-CM | POA: Insufficient documentation

## 2015-05-22 DIAGNOSIS — Z8679 Personal history of other diseases of the circulatory system: Secondary | ICD-10-CM | POA: Insufficient documentation

## 2015-05-22 DIAGNOSIS — G4733 Obstructive sleep apnea (adult) (pediatric): Secondary | ICD-10-CM | POA: Insufficient documentation

## 2015-05-22 DIAGNOSIS — M542 Cervicalgia: Secondary | ICD-10-CM

## 2015-05-22 DIAGNOSIS — M62838 Other muscle spasm: Secondary | ICD-10-CM | POA: Insufficient documentation

## 2015-05-22 DIAGNOSIS — S46812A Strain of other muscles, fascia and tendons at shoulder and upper arm level, left arm, initial encounter: Secondary | ICD-10-CM

## 2015-05-22 MED ORDER — HYDROCODONE-ACETAMINOPHEN 5-325 MG PO TABS: 1.0000 | ORAL_TABLET | Freq: Four times a day (QID) | ORAL | Status: DC | PRN

## 2015-05-22 MED ORDER — NAPROXEN 500 MG PO TABS: 500.0000 mg | ORAL_TABLET | Freq: Two times a day (BID) | ORAL | Status: DC | PRN

## 2015-05-22 MED ORDER — CYCLOBENZAPRINE HCL 10 MG PO TABS: 10.0000 mg | ORAL_TABLET | Freq: Three times a day (TID) | ORAL | Status: DC | PRN

## 2015-05-22 NOTE — ED Notes (Signed)
Pt is in stable condition upon d/c and ambulates from ED. 
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
## 2015-05-28 ENCOUNTER — Encounter (HOSPITAL_COMMUNITY): Payer: Self-pay | Admitting: Emergency Medicine

## 2015-05-28 ENCOUNTER — Inpatient Hospital Stay (HOSPITAL_COMMUNITY)
Admission: EM | Admit: 2015-05-28 | Discharge: 2015-05-30 | DRG: 603 | Disposition: A | Discharge status: Home / Self Care | Payer: Managed Care, Other (non HMO) | Attending: Internal Medicine | Admitting: Internal Medicine

## 2015-05-28 DIAGNOSIS — L03115 Cellulitis of right lower limb: Secondary | ICD-10-CM

## 2015-05-28 DIAGNOSIS — I890 Lymphedema, not elsewhere classified: Secondary | ICD-10-CM | POA: Diagnosis present

## 2015-05-28 DIAGNOSIS — F17210 Nicotine dependence, cigarettes, uncomplicated: Secondary | ICD-10-CM | POA: Diagnosis present

## 2015-05-28 DIAGNOSIS — Z791 Long term (current) use of non-steroidal anti-inflammatories (NSAID): Secondary | ICD-10-CM

## 2015-05-28 DIAGNOSIS — M7989 Other specified soft tissue disorders: Secondary | ICD-10-CM | POA: Diagnosis present

## 2015-05-28 DIAGNOSIS — I340 Nonrheumatic mitral (valve) insufficiency: Secondary | ICD-10-CM | POA: Diagnosis present

## 2015-05-28 DIAGNOSIS — E669 Obesity, unspecified: Secondary | ICD-10-CM | POA: Diagnosis present

## 2015-05-28 DIAGNOSIS — Z86718 Personal history of other venous thrombosis and embolism: Secondary | ICD-10-CM

## 2015-05-28 DIAGNOSIS — Z6832 Body mass index (BMI) 32.0-32.9, adult: Secondary | ICD-10-CM

## 2015-05-28 DIAGNOSIS — M79661 Pain in right lower leg: Secondary | ICD-10-CM | POA: Diagnosis present

## 2015-05-28 DIAGNOSIS — L89892 Pressure ulcer of other site, stage 2: Secondary | ICD-10-CM | POA: Diagnosis present

## 2015-05-28 DIAGNOSIS — S91109A Unspecified open wound of unspecified toe(s) without damage to nail, initial encounter: Secondary | ICD-10-CM

## 2015-05-28 DIAGNOSIS — I872 Venous insufficiency (chronic) (peripheral): Secondary | ICD-10-CM | POA: Diagnosis present

## 2015-05-28 DIAGNOSIS — G4733 Obstructive sleep apnea (adult) (pediatric): Secondary | ICD-10-CM | POA: Diagnosis present

## 2015-05-28 DIAGNOSIS — Z79891 Long term (current) use of opiate analgesic: Secondary | ICD-10-CM

## 2015-05-28 DIAGNOSIS — Z6834 Body mass index (BMI) 34.0-34.9, adult: Secondary | ICD-10-CM

## 2015-05-28 DIAGNOSIS — Z9989 Dependence on other enabling machines and devices: Secondary | ICD-10-CM | POA: Diagnosis present

## 2015-05-28 DIAGNOSIS — D696 Thrombocytopenia, unspecified: Secondary | ICD-10-CM | POA: Diagnosis present

## 2015-05-28 HISTORY — DX: Lymphedema, not elsewhere classified: I89.0

## 2015-05-28 LAB — COMPREHENSIVE METABOLIC PANEL
ALT: 11 U/L — AB (ref 17–63)
AST: 26 U/L (ref 15–41)
Albumin: 3.2 g/dL — ABNORMAL LOW (ref 3.5–5.0)
Alkaline Phosphatase: 57 U/L (ref 38–126)
Anion gap: 11 (ref 5–15)
BUN: 6 mg/dL (ref 6–20)
CALCIUM: 8.5 mg/dL — AB (ref 8.9–10.3)
CO2: 22 mmol/L (ref 22–32)
CREATININE: 1.02 mg/dL (ref 0.61–1.24)
Chloride: 101 mmol/L (ref 101–111)
Glucose, Bld: 148 mg/dL — ABNORMAL HIGH (ref 65–99)
Potassium: 3.6 mmol/L (ref 3.5–5.1)
SODIUM: 134 mmol/L — AB (ref 135–145)
TOTAL PROTEIN: 6.4 g/dL — AB (ref 6.5–8.1)
Total Bilirubin: 0.6 mg/dL (ref 0.3–1.2)

## 2015-05-28 LAB — CBC WITH DIFFERENTIAL/PLATELET
BASOS PCT: 0 % (ref 0–1)
Basophils Absolute: 0 10*3/uL (ref 0.0–0.1)
EOS ABS: 0 10*3/uL (ref 0.0–0.7)
Eosinophils Relative: 0 % (ref 0–5)
HEMATOCRIT: 41.9 % (ref 39.0–52.0)
Hemoglobin: 14.3 g/dL (ref 13.0–17.0)
Lymphocytes Relative: 7 % — ABNORMAL LOW (ref 12–46)
Lymphs Abs: 0.8 10*3/uL (ref 0.7–4.0)
MCH: 30.4 pg (ref 26.0–34.0)
MCHC: 34.1 g/dL (ref 30.0–36.0)
MCV: 89.1 fL (ref 78.0–100.0)
MONO ABS: 0.5 10*3/uL (ref 0.1–1.0)
MONOS PCT: 4 % (ref 3–12)
Neutro Abs: 10 10*3/uL — ABNORMAL HIGH (ref 1.7–7.7)
Neutrophils Relative %: 89 % — ABNORMAL HIGH (ref 43–77)
Platelets: 135 10*3/uL — ABNORMAL LOW (ref 150–400)
RBC: 4.7 MIL/uL (ref 4.22–5.81)
RDW: 13.2 % (ref 11.5–15.5)
WBC: 11.3 10*3/uL — ABNORMAL HIGH (ref 4.0–10.5)

## 2015-05-28 MED ORDER — ACETAMINOPHEN 325 MG PO TABS
650.0000 mg | ORAL_TABLET | Freq: Once | ORAL | Status: AC
  Administered 2015-05-28: 650 mg via ORAL

## 2015-05-28 MED ORDER — ACETAMINOPHEN 325 MG PO TABS
ORAL_TABLET | ORAL | Status: AC
  Filled 2015-05-28: qty 2

## 2015-05-28 NOTE — ED Notes (Signed)
Pt. reports progressing swelling/reddness " mass" at right inner thigh onset this week , denies injury. Febrile at triage.

## 2015-05-29 ENCOUNTER — Encounter (HOSPITAL_COMMUNITY): Payer: Self-pay | Admitting: Internal Medicine

## 2015-05-29 ENCOUNTER — Inpatient Hospital Stay (HOSPITAL_COMMUNITY): Payer: Managed Care, Other (non HMO)

## 2015-05-29 DIAGNOSIS — L03115 Cellulitis of right lower limb: Secondary | ICD-10-CM | POA: Diagnosis present

## 2015-05-29 DIAGNOSIS — M7989 Other specified soft tissue disorders: Secondary | ICD-10-CM | POA: Diagnosis present

## 2015-05-29 DIAGNOSIS — I890 Lymphedema, not elsewhere classified: Secondary | ICD-10-CM | POA: Diagnosis present

## 2015-05-29 DIAGNOSIS — G4733 Obstructive sleep apnea (adult) (pediatric): Secondary | ICD-10-CM | POA: Diagnosis present

## 2015-05-29 DIAGNOSIS — Z9989 Dependence on other enabling machines and devices: Secondary | ICD-10-CM

## 2015-05-29 DIAGNOSIS — L89892 Pressure ulcer of other site, stage 2: Secondary | ICD-10-CM | POA: Diagnosis present

## 2015-05-29 DIAGNOSIS — I340 Nonrheumatic mitral (valve) insufficiency: Secondary | ICD-10-CM | POA: Diagnosis present

## 2015-05-29 DIAGNOSIS — F17210 Nicotine dependence, cigarettes, uncomplicated: Secondary | ICD-10-CM

## 2015-05-29 DIAGNOSIS — B9689 Other specified bacterial agents as the cause of diseases classified elsewhere: Secondary | ICD-10-CM | POA: Diagnosis not present

## 2015-05-29 DIAGNOSIS — Z79891 Long term (current) use of opiate analgesic: Secondary | ICD-10-CM | POA: Diagnosis not present

## 2015-05-29 DIAGNOSIS — Z6832 Body mass index (BMI) 32.0-32.9, adult: Secondary | ICD-10-CM

## 2015-05-29 DIAGNOSIS — D696 Thrombocytopenia, unspecified: Secondary | ICD-10-CM | POA: Diagnosis present

## 2015-05-29 DIAGNOSIS — Z791 Long term (current) use of non-steroidal anti-inflammatories (NSAID): Secondary | ICD-10-CM | POA: Diagnosis not present

## 2015-05-29 DIAGNOSIS — Z86718 Personal history of other venous thrombosis and embolism: Secondary | ICD-10-CM | POA: Diagnosis not present

## 2015-05-29 DIAGNOSIS — Z6834 Body mass index (BMI) 34.0-34.9, adult: Secondary | ICD-10-CM | POA: Diagnosis not present

## 2015-05-29 DIAGNOSIS — M79661 Pain in right lower leg: Secondary | ICD-10-CM | POA: Diagnosis present

## 2015-05-29 DIAGNOSIS — E669 Obesity, unspecified: Secondary | ICD-10-CM | POA: Diagnosis present

## 2015-05-29 DIAGNOSIS — I872 Venous insufficiency (chronic) (peripheral): Secondary | ICD-10-CM | POA: Diagnosis present

## 2015-05-29 LAB — CBC WITH DIFFERENTIAL/PLATELET
Basophils Absolute: 0 10*3/uL (ref 0.0–0.1)
Basophils Relative: 0 % (ref 0–1)
EOS ABS: 0 10*3/uL (ref 0.0–0.7)
Eosinophils Relative: 0 % (ref 0–5)
HCT: 39.4 % (ref 39.0–52.0)
HEMOGLOBIN: 13.2 g/dL (ref 13.0–17.0)
Lymphocytes Relative: 12 % (ref 12–46)
Lymphs Abs: 1.8 10*3/uL (ref 0.7–4.0)
MCH: 30 pg (ref 26.0–34.0)
MCHC: 33.5 g/dL (ref 30.0–36.0)
MCV: 89.5 fL (ref 78.0–100.0)
MONOS PCT: 5 % (ref 3–12)
Monocytes Absolute: 0.7 10*3/uL (ref 0.1–1.0)
NEUTROS ABS: 12.2 10*3/uL — AB (ref 1.7–7.7)
Neutrophils Relative %: 83 % — ABNORMAL HIGH (ref 43–77)
Platelets: 132 10*3/uL — ABNORMAL LOW (ref 150–400)
RBC: 4.4 MIL/uL (ref 4.22–5.81)
RDW: 13.4 % (ref 11.5–15.5)
WBC: 14.8 10*3/uL — ABNORMAL HIGH (ref 4.0–10.5)

## 2015-05-29 LAB — BASIC METABOLIC PANEL
Anion gap: 4 — ABNORMAL LOW (ref 5–15)
BUN: 8 mg/dL (ref 6–20)
CO2: 28 mmol/L (ref 22–32)
Calcium: 8.3 mg/dL — ABNORMAL LOW (ref 8.9–10.3)
Chloride: 102 mmol/L (ref 101–111)
Creatinine, Ser: 0.88 mg/dL (ref 0.61–1.24)
GFR calc Af Amer: >60 mL/min (ref >60)
GLUCOSE: 98 mg/dL (ref 65–99)
POTASSIUM: 3.5 mmol/L (ref 3.5–5.1)
SODIUM: 134 mmol/L — AB (ref 135–145)

## 2015-05-29 LAB — CG4 I-STAT (LACTIC ACID): Lactic Acid, Venous: 1.81 mmol/L (ref 0.5–2.0)

## 2015-05-29 LAB — SEDIMENTATION RATE: Sed Rate: 6 mm/hr (ref 0–16)

## 2015-05-29 LAB — D-DIMER, QUANTITATIVE (NOT AT ARMC): D DIMER QUANT: 0.36 ug{FEU}/mL (ref 0.00–0.48)

## 2015-05-29 MED ORDER — ENOXAPARIN SODIUM 40 MG/0.4ML ~~LOC~~ SOLN
40.0000 mg | SUBCUTANEOUS | Status: DC
  Administered 2015-05-29 – 2015-05-30 (×2): 40 mg via SUBCUTANEOUS
  Filled 2015-05-29: qty 0.4

## 2015-05-29 MED ORDER — NICOTINE 7 MG/24HR TD PT24
7.0000 mg | MEDICATED_PATCH | Freq: Once | TRANSDERMAL | Status: DC
  Filled 2015-05-29: qty 1

## 2015-05-29 MED ORDER — MORPHINE SULFATE 4 MG/ML IJ SOLN
4.0000 mg | Freq: Once | INTRAMUSCULAR | Status: AC
  Administered 2015-05-29: 4 mg via INTRAVENOUS
  Filled 2015-05-29: qty 1

## 2015-05-29 MED ORDER — NICOTINE 14 MG/24HR TD PT24: 14.0000 mg | MEDICATED_PATCH | Freq: Every day | TRANSDERMAL | Status: DC

## 2015-05-29 MED ORDER — MUPIROCIN CALCIUM 2 % EX CREA
TOPICAL_CREAM | Freq: Every day | CUTANEOUS | Status: DC
  Administered 2015-05-29 – 2015-05-30 (×2): via TOPICAL
  Filled 2015-05-29: qty 15

## 2015-05-29 MED ORDER — NICOTINE 14 MG/24HR TD PT24
14.0000 mg | MEDICATED_PATCH | Freq: Every day | TRANSDERMAL | Status: DC
  Filled 2015-05-29: qty 1

## 2015-05-29 MED ORDER — NICOTINE 14 MG/24HR TD PT24
14.0000 mg | MEDICATED_PATCH | Freq: Every day | TRANSDERMAL | Status: DC
  Administered 2015-05-29 – 2015-05-30 (×2): 14 mg via TRANSDERMAL
  Filled 2015-05-29: qty 1

## 2015-05-29 MED ORDER — HYDROCODONE-ACETAMINOPHEN 5-325 MG PO TABS: 1.0000 | ORAL_TABLET | Freq: Three times a day (TID) | ORAL | Status: DC | PRN

## 2015-05-29 MED ORDER — ENOXAPARIN SODIUM 40 MG/0.4ML ~~LOC~~ SOLN: 40.0000 mg | SUBCUTANEOUS | Status: DC

## 2015-05-29 MED ORDER — VANCOMYCIN HCL IN DEXTROSE 1-5 GM/200ML-% IV SOLN
1000.0000 mg | Freq: Once | INTRAVENOUS | Status: AC
  Administered 2015-05-29: 1000 mg via INTRAVENOUS
  Filled 2015-05-29: qty 200

## 2015-05-29 MED ORDER — CEFAZOLIN SODIUM-DEXTROSE 2-3 GM-% IV SOLR
2.0000 g | Freq: Three times a day (TID) | INTRAVENOUS | Status: DC
  Administered 2015-05-29 – 2015-05-30 (×4): 2 g via INTRAVENOUS
  Filled 2015-05-29 (×5): qty 50

## 2015-05-29 MED ORDER — SODIUM CHLORIDE 0.9 % IV SOLN: INTRAVENOUS | Status: AC

## 2015-05-29 MED ORDER — ACETAMINOPHEN 325 MG PO TABS: 325.0000 mg | ORAL_TABLET | Freq: Two times a day (BID) | ORAL | Status: DC | PRN

## 2015-05-29 MED ORDER — ENOXAPARIN SODIUM 100 MG/ML ~~LOC~~ SOLN
1.0000 mg/kg | Freq: Once | SUBCUTANEOUS | Status: AC
  Administered 2015-05-29: 95 mg via SUBCUTANEOUS
  Filled 2015-05-29: qty 1

## 2015-05-29 MED ORDER — SODIUM CHLORIDE 0.9 % IV SOLN
INTRAVENOUS | Status: DC
  Administered 2015-05-29 (×2): via INTRAVENOUS

## 2015-05-29 NOTE — ED Provider Notes (Signed)
CSN: [id number]     Arrival date & time 05/28/15  2201 History  This chart was scribed for Delora Fuel, MD by Eustaquio Maize, ED Scribe. This patient was seen in room D30C/D30C and the patient's care was started at 12:16 AM.  Chief Complaint  Patient presents with  . Cellulitis   The history is provided by the patient. No language interpreter was used.     HPI Comments: Oscar Brown is a 49 y.o. male with hx phlebitis in left lower extremity who presents to the Emergency Department complaining of mass to medial aspect right thigh that began yesterday. He notes increased swelling and redness to the area. He rates the pain as a 10/10 on the pain scale. He mentions that he felt like he may have pulled a muscle while at work yesterday. Pt notes right inguinal hernia that was followed up for by surgeon. He reports that the surgeon did not want to operate due to how small the hernia is. Pt also complains of fever of 103 and lightheadedness that began today. Pt received Tylenol in the ED which he states has given him mild relief from the pain. Denies chest pain, shortness of breath, or any other symptoms. Pt is former cigarette smoker who quit recently.   Past Medical History  Diagnosis Date  . 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  Complicated by chronic left lower extremity edema and occasional recurrent left lower extremity cellulitis   . Obstructive sleep apnea 12/14/2006    AHI 19.2/hr, desaturation to 75% on Polysomnography 02/26/2005.  Uses 11 cm H2O nocturnal nasal CPAP with good symptom control.   . Obesity, Class II, BMI 35-39.9 12/14/2006  . Mitral regurgitation 10/06/2012    On examination   . Lymphedema    History reviewed. No pertinent past surgical history. No family history on file. History  Substance Use Topics  . Smoking status: Former Smoker -- 0.10 packs/day    Types: Cigarettes    Quit date: 09/05/2012  . Smokeless tobacco: Never Used   . Alcohol Use: No    Review of Systems  Constitutional: Positive for fever.  Respiratory: Negative for shortness of breath.   Cardiovascular: Negative for chest pain.  Musculoskeletal: Positive for joint swelling and arthralgias (Left medial thigh pain).  Skin: Positive for color change.  Neurological: Positive for light-headedness.  All other systems reviewed and are negative.  Allergies  Review of patient's allergies indicates no known allergies.  Home Medications   Prior to Admission medications   Medication Sig Start Date End Date Taking? Authorizing Provider  cyclobenzaprine (FLEXERIL) 10 MG tablet Take 1 tablet (10 mg total) by mouth 3 (three) times daily as needed for muscle spasms. 05/22/15   Mercedes Camprubi-Soms, PA-C  HYDROcodone-acetaminophen (NORCO) 5-325 MG per tablet Take 1 tablet by mouth every 6 (six) hours as needed for severe pain. 05/22/15   Mercedes Camprubi-Soms, PA-C  naproxen (NAPROSYN) 500 MG tablet Take 1 tablet (500 mg total) by mouth 2 (two) times daily as needed for mild pain, moderate pain or headache (TAKE WITH MEALS.). 05/22/15   Mercedes Camprubi-Soms, PA-C   Triage Vitals: BP 150/73 mmHg  Pulse 81  Temp(Src) 103.2 F (39.6 C) (Oral)  Resp 16  Ht 5\' 8"  (1.727 m)  Wt 213 lb (96.616 kg)  BMI 32.39 kg/m2  SpO2 98%   Physical Exam  Constitutional: He is oriented to person, place, and time. He appears well-developed and well-nourished. No distress.  HENT:  Head: Normocephalic and atraumatic.  Eyes: Conjunctivae and EOM are normal. Pupils are equal, round, and reactive to light.  Neck: Normal range of motion. Neck supple. No JVD present.  Cardiovascular: Normal rate, regular rhythm and normal heart sounds.   No murmur heard. Pulmonary/Chest: Effort normal and breath sounds normal. He has no wheezes. He has no rales. He exhibits no tenderness.  Abdominal: Soft. Bowel sounds are normal. He exhibits no distension and no mass. There is no tenderness.   Musculoskeletal: Normal range of motion. He exhibits edema.  Erythema, warmth, and slight swelling medial aspect of right knee and distal right thigh Cord palpable medial right thigh with moderate tenderness over the inguinal vessels  1+ edema both legs  Venous stasis changes present on the left   Lymphadenopathy:    He has no cervical adenopathy.  Neurological: He is alert and oriented to person, place, and time. No cranial nerve deficit. He exhibits normal muscle tone. Coordination normal.  Skin: Skin is warm and dry.  Psychiatric: He has a normal mood and affect. His behavior is normal. Judgment and thought content normal.  Nursing note and vitals reviewed.   ED Course  Procedures (including critical care time)  DIAGNOSTIC STUDIES: Oxygen Saturation is 98% on RA, normal by my interpretation.    COORDINATION OF CARE: 12:24 AM-Discussed treatment plan which includes Sedimentation rate, Lactic acid, Blood culture, CBC, CMP with pt at bedside and pt agreed to plan.   Labs Review Results for orders placed or performed during the hospital encounter of 05/28/15  CBC with Differential  Result Value Ref Range   WBC 11.3 (H) 4.0 - 10.5 K/uL   RBC 4.70 4.22 - 5.81 MIL/uL   Hemoglobin 14.3 13.0 - 17.0 g/dL   HCT 41.9 39.0 - 52.0 %   MCV 89.1 78.0 - 100.0 fL   MCH 30.4 26.0 - 34.0 pg   MCHC 34.1 30.0 - 36.0 g/dL   RDW 13.2 11.5 - 15.5 %   Platelets 135 (L) 150 - 400 K/uL   Neutrophils Relative % 89 (H) 43 - 77 %   Neutro Abs 10.0 (H) 1.7 - 7.7 K/uL   Lymphocytes Relative 7 (L) 12 - 46 %   Lymphs Abs 0.8 0.7 - 4.0 K/uL   Monocytes Relative 4 3 - 12 %   Monocytes Absolute 0.5 0.1 - 1.0 K/uL   Eosinophils Relative 0 0 - 5 %   Eosinophils Absolute 0.0 0.0 - 0.7 K/uL   Basophils Relative 0 0 - 1 %   Basophils Absolute 0.0 0.0 - 0.1 K/uL  Comprehensive metabolic panel  Result Value Ref Range   Sodium 134 (L) 135 - 145 mmol/L   Potassium 3.6 3.5 - 5.1 mmol/L   Chloride 101 101 - 111  mmol/L   CO2 22 22 - 32 mmol/L   Glucose, Bld 148 (H) 65 - 99 mg/dL   BUN 6 6 - 20 mg/dL   Creatinine, Ser 1.02 0.61 - 1.24 mg/dL   Calcium 8.5 (L) 8.9 - 10.3 mg/dL   Total Protein 6.4 (L) 6.5 - 8.1 g/dL   Albumin 3.2 (L) 3.5 - 5.0 g/dL   AST 26 15 - 41 U/L   ALT 11 (L) 17 - 63 U/L   Alkaline Phosphatase 57 38 - 126 U/L   Total Bilirubin 0.6 0.3 - 1.2 mg/dL   GFR calc non Af Amer >60 >60 mL/min   GFR calc Af Amer >60 >60 mL/min   Anion gap  11 5 - 15    MDM   Final diagnoses:  Cellulitis of right thigh    Fever with erythema of the right thigh suggestive of cellulitis. However, he also has a palpable cord. There is a history of prior DVT in the other leg. It is possible that this actually represents a septic thrombophlebitis. He is started on Vicente Males biotics of for cellulitis and also on anticoagulation for possible phlebitis. Case is discussed with Dr. Aundra Dubin of internal medicine teaching service who agrees to admit the patient.   I personally performed the services described in this documentation, which was scribed in my presence. The recorded information has been reviewed and is accurate.       Delora Fuel, MD 16/10/96 0454

## 2015-05-29 NOTE — Progress Notes (Addendum)
05/29/2015 02:30 AM  Patient arrived to unit via stretcher with ED nurse. Patient alert and oriented x4. Patient orient to room, unit and staff. IV clean, dry, intact and infusing. Skin assessment completed with Leandro Reasoner, RN. Noted was redness and warmth on the medial side of the knee extending to upper thigh and a white ulcer on the side of the right 2nd toe. Pain denies being in any pain. Safety Fall Prevention Plan was given, discussed and signed by patient. Orders have been reviewed and implemented. Call light has been placed within reach. RN will continue to monitor the patient.  Nena Polio BSN, RN  Phone Number: [phone]

## 2015-05-29 NOTE — Consult Note (Signed)
WOC wound consult note Reason for Consult: Right second metatarsal wound.  Began as a blister, per patient.  He has been treating with an antifungal cream, he states.  Wound type: Ruptured blister, not resolving. Stage II pressure injury.  Pressure Ulcer POA: Yes Measurement:2 cm x 1.2 cm x 0.1 cm Wound bed:100% pale pink Drainage (amount, consistency, odor) Minimal serosanguinous.  No odor.  Periwound:Intact.   Dressing procedure/placement/frequency:Cleanse right second toe with NS and pat gently dry.  Apply Muciprocin ointment to wound bed.  Cover with 2x2 and tape.  Change daily.  Will not follow at this time.  Please re-consult if needed.  Domenic Moras RN BSN [place] Pager [phone]

## 2015-05-29 NOTE — Progress Notes (Addendum)
Subjective: Feels better today, leg improved, redness and swelling decreased. Afebrile. BP, HR stable.   Objective: Vital signs in last 24 hours: Filed Vitals:   05/29/15 0300 05/29/15 0455 05/29/15 0500 05/29/15 1000  BP: 113/64   108/59  Pulse: 68   63  Temp: 100.1 F (37.8 C) 99.8 F (37.7 C)  99.3 F (37.4 C)  TempSrc: Oral Oral  Oral  Resp: 16   18  Height:      Weight:   222 lb 12.8 oz (101.061 kg)   SpO2: 100%   100%   Weight change:   Intake/Output Summary (Last 24 hours) at 05/29/15 1159 Last data filed at 05/29/15 0900  Gross per 24 hour  Intake 856.67 ml  Output      0 ml  Net 856.67 ml   Physical Exam: General: AA male, alert, cooperative, NAD.  HEENT: PERRL, EOMI. Moist mucus membranes Neck: Full range of motion without pain, supple, no lymphadenopathy or carotid bruits Lungs: Clear to ascultation bilaterally, normal work of respiration, no wheezes, rales, rhonchi Heart: RRR, no murmurs, gallops, or rubs Abdomen: Soft, non-tender, non-distended, BS + Extremities: No cyanosis, clubbing, or edema. Right leg erythema overlying the knee medially. Induration noted on superior portion orf cellulitis. Erythematous region tender, warm to the touch, improved per patient.  Neurologic: Alert & oriented x3, cranial nerves II-XII intact, strength grossly intact, sensation intact to light touch.   Lab Results: Basic Metabolic Panel:  Recent Labs Lab 05/28/15 2230 05/29/15 0516  NA 134* 134*  K 3.6 3.5  CL 101 102  CO2 22 28  GLUCOSE 148* 98  BUN 6 8  CREATININE 1.02 0.88  CALCIUM 8.5* 8.3*   Liver Function Tests:  Recent Labs Lab 05/28/15 2230  AST 26  ALT 11*  ALKPHOS 57  BILITOT 0.6  PROT 6.4*  ALBUMIN 3.2*   CBC:  Recent Labs Lab 05/28/15 2230 05/29/15 0516  WBC 11.3* 14.8*  NEUTROABS 10.0* 12.2*  HGB 14.3 13.2  HCT 41.9 39.4  MCV 89.1 89.5  PLT 135* 132*   D-Dimer:  Recent Labs Lab 05/29/15 0518  DDIMER 0.36     Studies/Results: Dg Toe 2nd Right  05/29/2015   CLINICAL DATA:  Open wound, evaluate for osteomyelitis  EXAM: RIGHT SECOND TOE  COMPARISON:  None.  FINDINGS: There is no evidence of fracture or dislocation. There is no evidence of arthropathy or other focal bone abnormality. Soft tissues are unremarkable.  IMPRESSION: Negative.   Electronically Signed   By: Conchita Paris M.D.   On: 05/29/2015 10:04   Medications: I have reviewed the patient's current medications. Scheduled Meds: .  ceFAZolin (ANCEF) IV  2 g Intravenous 3 times per day  . enoxaparin (LOVENOX) injection  40 mg Subcutaneous Q24H  . nicotine  14 mg Transdermal Daily   Continuous Infusions: . sodium chloride 100 mL/hr at 05/29/15 1149   PRN Meds:.acetaminophen, HYDROcodone-acetaminophen   Assessment/Plan: 49 y/o M w/ PMHx of chronic venous stasis and lymphedema (L>R), previous DVT in LLE, OSA, and obesity, admitted for RLE cellulitis.   RLE Cellulitis: Patient presented w/ leukocytosis, fever of 103, erythema, tenderness overlying the right knee, induration, increased warmth. Given dose Vancomycin in the ED, changed to Ancef. Some question of DVT given history, however, D-dimer negative. WBC's slightly increased today, however, clinically patient has improved. Erythema, tenderness, and induration has decreased.  -Continue Ancef for now -NS @ 100 cc/hr -Vicodin q8h prn for pain -Repeat CBC in AM -Tylenol prn  for fever  Tobacco Abuse: Nicoderm CQ  Dispo: Disposition is deferred at this time, awaiting improvement of current medical problems.  Anticipated discharge in approximately 1-2 day(s).   The patient does have a current PCP Oval Linsey, MD) and does need an [facility] hospital follow-up appointment after discharge.  The patient does not have transportation limitations that hinder transportation to clinic appointments.  .Services Needed at time of discharge: Y = Yes, Blank = No PT:   OT:   RN:   Equipment:    Other:     LOS: 0 days   Corky Sox, MD 05/29/2015, 11:59 AM

## 2015-05-29 NOTE — Progress Notes (Signed)
Utilization review completed. Tomothy Eddins, RN, BSN. 

## 2015-05-29 NOTE — H&P (Signed)
Date: 05/29/2015               Patient Name:  Oscar Brown MRN: [id number]  DOB: Mar 30, 1966 Age / Sex: 49 y.o., male   PCP: Oscar Linsey, MD         Medical Service: Internal Medicine Teaching Service         Attending Physician: Dr. Madilyn Fireman, MD    First Contact: Oscar Brown Pager: [phone]  Second Contact: Dr. Ronnald Brown Pager: [phone]       After Hours (After 5p/  First Contact Pager: [phone]  weekends / holidays): Second Contact Pager: [phone]   Chief Complaint: rt leg swelling  History of Present Illness: 49 y/o M w/ PMHx of chronic venous insufficiency 2/2 lt leg DVT in 1993, OSA, mitral regurgitation, and lymphedema who presents with rt thigh swelling and redness. 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 Rt leg pain was 10/10 when it first started and is currently a 5/10 in severity. He had a subjective fever and chills today. He denies calf swelling, night sweats, SOB, chest pain, cough, recent long periods of travevl, recent surgery or falls, or hx of cancer. He smokes 1/2 PPD, and has had intentional weight loss. He has a hx of a left leg DVT in 1993 that he believes was due to trauma when he hit his left shin against his bed. His dad had a history of a blood clot but he is unsure where. After work yesterday he also experienced an episode of dizziness during which he felt the room was spinning that last 7-10 minutes and resolved when he laid down. He admits to poor po intake and being exhausted after work. Denies current dizziness. Started on vanc and given therapeutic dose of lovenox in the ED.    Meds: Current Facility-Administered Medications  Medication Dose Route Frequency Provider Last Rate Last Dose  . enoxaparin (LOVENOX) injection 95 mg  1 mg/kg Subcutaneous Once Oscar Fuel, MD      . nicotine (NICODERM CQ - dosed in mg/24 hr) patch 7 mg  7 mg Transdermal Once  Oscar Fuel, MD      . vancomycin (VANCOCIN) IVPB 1000 mg/200 mL premix  1,000 mg Intravenous Once Oscar Fuel, MD       Current Outpatient Prescriptions  Medication Sig Dispense Refill  . cyclobenzaprine (FLEXERIL) 10 MG tablet Take 1 tablet (10 mg total) by mouth 3 (three) times daily as needed for muscle spasms. 15 tablet 0  . HYDROcodone-acetaminophen (NORCO) 5-325 MG per tablet Take 1 tablet by mouth every 6 (six) hours as needed for severe pain. 6 tablet 0  . naproxen (NAPROSYN) 500 MG tablet Take 1 tablet (500 mg total) by mouth 2 (two) times daily as needed for mild pain, moderate pain or headache (TAKE WITH MEALS.). 20 tablet 0    Allergies: Allergies as of 05/28/2015  . (No Known Allergies)   Past Medical History  Diagnosis Date  . 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  Complicated by chronic left lower extremity edema and occasional recurrent left lower extremity cellulitis   . Obstructive sleep apnea 12/14/2006    AHI 19.2/hr, desaturation to 75% on Polysomnography 02/26/2005.  Uses 11 cm H2O nocturnal nasal CPAP with good symptom control.   . Obesity, Class II, BMI 35-39.9 12/14/2006  . Mitral regurgitation 10/06/2012  On examination   . Lymphedema    History reviewed. No pertinent past surgical history. No family history on file. History   Social History  . Marital Status: Legally Separated    Spouse Name: N/A  . Number of Children: N/A  . Years of Education: N/A   Occupational History  . Not on file.   Social History Main Topics  . Smoking status: Former Smoker -- 0.10 packs/day    Types: Cigarettes    Quit date: 09/05/2012  . Smokeless tobacco: Never Used  . Alcohol Use: No  . Drug Use: No  . Sexual Activity: Not on file   Other Topics Concern  . Not on file   Social History Narrative    Review of Systems: Pertinent items are noted in HPI.  Physical Exam: Blood pressure 150/73, pulse 81, temperature 103.2 F  (39.6 C), temperature source Oral, resp. rate 16, height 5\' 8"  (1.727 m), weight 213 lb (96.616 kg), SpO2 98 %. Physical Exam  Constitutional: He appears well-developed and well-nourished. No distress.  HENT:  Head: Normocephalic.  Mouth/Throat: Oropharynx is clear and moist.  Eyes: Conjunctivae are normal. Pupils are equal, round, and reactive to light. No scleral icterus.  Cardiovascular: Normal rate and regular rhythm.   Murmur (2/6 systolic murmur at left sternal border, could not appreciate any radiation to axilla) heard. Pulmonary/Chest: Breath sounds normal. No respiratory distress. He has no wheezes. He has no rales.  Abdominal: Soft. Bowel sounds are normal. He exhibits no distension and no mass. There is no tenderness. There is no rebound and no guarding.  Musculoskeletal:  Left leg w/ 2+ non pitting edema and non tender to palpation. Rt leg thigh with erythema and warmth on the medial side of knee extending to upper thigh, mildly tender, no palpable cord. Rt 2nd toe on the medial side with a white ulcer  Neurological: He is alert.  Skin: Skin is warm and dry. He is not diaphoretic.        Lab results: Basic Metabolic Panel:  Recent Labs  05/28/15 2230  NA 134*  K 3.6  CL 101  CO2 22  GLUCOSE 148*  BUN 6  CREATININE 1.02  CALCIUM 8.5*   Liver Function Tests:  Recent Labs  05/28/15 2230  AST 26  ALT 11*  ALKPHOS 57  BILITOT 0.6  PROT 6.4*  ALBUMIN 3.2*   CBC:  Recent Labs  05/28/15 2230  WBC 11.3*  NEUTROABS 10.0*  HGB 14.3  HCT 41.9  MCV 89.1  PLT 135*     EKG Interpretation: NSR, HR 68, QTc 419, no prior EKG to compare to  Assessment & Plan by Problem: Active Problems:   Cellulitis of right thigh   Left leg swelling   Rt thigh erythema and swelling -- concerning for DVT vs cellulitis. Wells score for DVT 0. Denies sedentary lifestyle, hx of cancer, calf swelling or tenderness, and recent surgery. He has a hx of a left leg DVT and is  not on [facility] for it, likely due to trauma per patient and would be considered provoked. He has a hx of lt leg cellulitis 2/2 chronic venous stasis due to hx of DVT and has not had any problems with it since. He has warmth and reddness, temp of 103.2 F, and WBC of 11.3. He states swelling and redness have gone down since abx administration. He received one dose of vanc in the ED. - admit to med surg - D-dimer ordered - LE dopplers -  f/u blood cultures - wound consult for 2nd rt toe - ancef per pharm - HgbA1c - CBC in the am - tylenol prn for fever - norco 5-325 mg TID prn for pain - orthostatic vitals   OSA-- CPAP qhs  Tobacco abuse-- smokes 1/2 PPD, cutting down - nictine 14mg  patch  FEN - heart healthy diet - NS at 100 cc/hr  DVT ppx-- received therapeutic lovenox dose in the ED, reassess tomorrow after LE dopplers   Dispo: Disposition is deferred at this time, awaiting improvement of current medical problems.   The patient does have a current PCP Oscar Linsey, MD) and does need an [facility] hospital follow-up appointment after discharge.  The patient does not have transportation limitations that hinder transportation to clinic appointments.  Signed: Norman Herrlich, MD 05/29/2015, 12:50 AM

## 2015-05-29 NOTE — ED Notes (Signed)
Admitting MDs at bedside.

## 2015-05-29 NOTE — Progress Notes (Signed)
Subjective: No acute events overnight. Afebrile. Feels much improved from yesterday. No leg pain, chest pain, dyspnea, sweats or chills.   Objective: Vital signs in last 24 hours: Filed Vitals:   05/29/15 0300 05/29/15 0455 05/29/15 0500 05/29/15 1000  BP: 113/64   108/59  Pulse: 68   63  Temp: 100.1 F (37.8 C) 99.8 F (37.7 C)  99.3 F (37.4 C)  TempSrc: Oral Oral  Oral  Resp: 16   18  Height:      Weight:   101.061 kg (222 lb 12.8 oz)   SpO2: 100%   100%   Weight change:   Intake/Output Summary (Last 24 hours) at 05/29/15 1149 Last data filed at 05/29/15 0900  Gross per 24 hour  Intake 856.67 ml  Output      0 ml  Net 856.67 ml   BP 108/59 mmHg  Pulse 63  Temp(Src) 99.3 F (37.4 C) (Oral)  Resp 18  Ht 5\' 8"  (1.727 m)  Wt 101.061 kg (222 lb 12.8 oz)  BMI 33.88 kg/m2  SpO2 100%  General Appearance:    Alert, cooperative, no distress, appears stated age  HEENT:    EOMI, no sceral icterus, trachea midline   Lungs:     Clear to auscultation bilaterally, respirations unlabored  Heart:    Regular rate and rhythm, S1 and S2 normal, soft 2/6 flow murmur consistent with AS, no rub or gallop  Abdomen:     Soft, non-tender, no masses  Extremities:   Erythema, warmth and mild swelling of distal right thigh with area of induration, improved from yesterday by marker lines, no pain to palpation; second right toe with large, dry, healing callus with no erythema, warmth or other signs of infection; significant lower left leg lymphedema, chronic   Neurologic:   CNs grossly intact. Normal sensation of lower extremities. No focal deficits. AOx3   Lab Results: CBC Latest Ref Rng 05/29/2015 05/28/2015  WBC 4.0 - 10.5 K/uL 14.8(H) 11.3(H)  Hemoglobin 13.0 - 17.0 g/dL 13.2 14.3  Hematocrit 39.0 - 52.0 % 39.4 41.9  Platelets 150 - 400 K/uL 132(L) 135(L)    BMP Latest Ref Rng 05/29/2015 05/28/2015 01/03/2012  Glucose 65 - 99 mg/dL 98 148(H) 86  BUN 6 - 20 mg/dL 8 6 15   Creatinine 0.61 -  1.24 mg/dL 0.88 1.02 0.85  Sodium 135 - 145 mmol/L 134(L) 134(L) 139  Potassium 3.5 - 5.1 mmol/L 3.5 3.6 4.1  Chloride 101 - 111 mmol/L 102 101 107  CO2 22 - 32 mmol/L 28 22 21   Calcium 8.9 - 10.3 mg/dL 8.3(L) 8.5(L) 9.4   D-dimer: 0.36 (neg)  Micro Results: No results found for this or any previous visit (from the past 240 hour(s)). Studies/Results: Dg Toe 2nd Right  05/29/2015   CLINICAL DATA:  Open wound, evaluate for osteomyelitis  EXAM: RIGHT SECOND TOE  COMPARISON:  None.  FINDINGS: There is no evidence of fracture or dislocation. There is no evidence of arthropathy or other focal bone abnormality. Soft tissues are unremarkable.  IMPRESSION: Negative.   Electronically Signed   By: Conchita Paris M.D.   On: 05/29/2015 10:04   Medications: I have reviewed the patient's current medications. Scheduled Meds: .  ceFAZolin (ANCEF) IV  2 g Intravenous 3 times per day  . enoxaparin (LOVENOX) injection  40 mg Subcutaneous Q24H  . nicotine  14 mg Transdermal Daily   Continuous Infusions: . sodium chloride 100 mL/hr at 05/29/15 0244   PRN Meds:.acetaminophen, HYDROcodone-acetaminophen  Assessment/Plan: Principal Problem:   Cellulitis of right thigh Active Problems:   Obstructive sleep apnea   Chronic venous insufficiency    Healing blister of right second toe   Tobacco abuse   BMI 32.0-32.9,adult  Rt thigh erythema and swelling 2/2 cellulitis Fever, sx, and area of erythema and swelling improved with abx. Wells score was 0 and D-dimer negative so not a DVT. WBC did increase from 11.3 to 14.8 on abx, but since clinically improved will continue ancef and monitor with CBC in am.  - f/u blood cultures - continue ancef 2g IV q8h, transition to oral abx if wbc improves - f/u HgbA1c - tylenol prn for fever - norco 5-325 mg TID prn for pain   Healing blister of right second toe - not infected, closed and unlikely to become infection risk - per wound care, appreciated recs -- bactroban  cream to wound bed and cover with 2x2 and tape.   OSA-- CPAP qhs  Tobacco abuse-- smokes 1/2 PPD, cutting down - nicotine 14mg  patch  FEN - heart healthy diet - IV locked - replete electolytes prn  DVT ppx - lovenox 40mg  sq daily  Dispo: Disposition is deferred at this time, awaiting improvement of current medical problems.   This is a Careers information officer Note.  The care of the patient was discussed with Dr. Ronnald Ramp and the assessment and plan formulated with their assistance.  Please see their attached note for official documentation of the daily encounter.   LOS: 0 days   Donnetta Simpers, Med Student 05/29/2015, 11:49 AM

## 2015-05-29 NOTE — Progress Notes (Signed)
ANTIBIOTIC CONSULT NOTE - INITIAL  Pharmacy Consult for Cefazolin Indication: R leg swelling  No Known Allergies  Patient Measurements: Height: 5\' 8"  (172.7 cm) Weight: 213 lb (96.616 kg) IBW/kg (Calculated) : 68.4  Vital Signs: Temp: 97.7 F (36.5 C) (06/30 0229) Temp Source: Oral (06/30 0229) BP: 113/72 mmHg (06/30 0229) Pulse Rate: 79 (06/30 0229) Intake/Output from previous day:   Intake/Output from this shift:    Labs:  Recent Labs  05/28/15 2230  WBC 11.3*  HGB 14.3  PLT 135*  CREATININE 1.02   Estimated Creatinine Clearance: 99.8 mL/min (by C-G formula based on Cr of 1.02). No results for input(s): VANCOTROUGH, VANCOPEAK, VANCORANDOM, GENTTROUGH, GENTPEAK, GENTRANDOM, TOBRATROUGH, TOBRAPEAK, TOBRARND, AMIKACINPEAK, AMIKACINTROU, AMIKACIN in the last 72 hours.   Microbiology: No results found for this or any previous visit (from the past 720 hour(s)).  Medical History: Past Medical History  Diagnosis Date  . 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  Complicated by chronic left lower extremity edema and occasional recurrent left lower extremity cellulitis   . Obstructive sleep apnea 12/14/2006    AHI 19.2/hr, desaturation to 75% on Polysomnography 02/26/2005.  Uses 11 cm H2O nocturnal nasal CPAP with good symptom control.   . Obesity, Class II, BMI 35-39.9 12/14/2006  . Mitral regurgitation 10/06/2012    On examination   . Lymphedema     Medications:  No prescriptions prior to admission   Assessment: 49 y.o. male presents with R leg swelling. Ancef 2gm IV q8h ordered by MD per cellulitis order set. Pt wt ~100kg. Estimated CrCl 100 ml/min.  Goal of Therapy:  Resolution of infection  Plan:  Continue Ancef 2gm IV q8h Pharmacy will sign off - please reconsult if needed  Sherlon Handing, PharmD, BCPS Clinical pharmacist, pager [phone] 05/29/2015,2:41 AM

## 2015-05-30 DIAGNOSIS — L03115 Cellulitis of right lower limb: Principal | ICD-10-CM

## 2015-05-30 DIAGNOSIS — B9689 Other specified bacterial agents as the cause of diseases classified elsewhere: Secondary | ICD-10-CM

## 2015-05-30 LAB — CBC WITH DIFFERENTIAL/PLATELET
BASOS ABS: 0 10*3/uL (ref 0.0–0.1)
Basophils Relative: 0 % (ref 0–1)
EOS PCT: 2 % (ref 0–5)
Eosinophils Absolute: 0.2 10*3/uL (ref 0.0–0.7)
HCT: 39 % (ref 39.0–52.0)
HEMOGLOBIN: 12.7 g/dL — AB (ref 13.0–17.0)
LYMPHS ABS: 1.3 10*3/uL (ref 0.7–4.0)
Lymphocytes Relative: 17 % (ref 12–46)
MCH: 29.5 pg (ref 26.0–34.0)
MCHC: 32.6 g/dL (ref 30.0–36.0)
MCV: 90.5 fL (ref 78.0–100.0)
MONO ABS: 0.8 10*3/uL (ref 0.1–1.0)
Monocytes Relative: 10 % (ref 3–12)
Neutro Abs: 5.3 10*3/uL (ref 1.7–7.7)
Neutrophils Relative %: 71 % (ref 43–77)
Platelets: 123 10*3/uL — ABNORMAL LOW (ref 150–400)
RBC: 4.31 MIL/uL (ref 4.22–5.81)
RDW: 13.7 % (ref 11.5–15.5)
WBC: 7.6 10*3/uL (ref 4.0–10.5)

## 2015-05-30 LAB — HEMOGLOBIN A1C
Hgb A1c MFr Bld: 5.3 % (ref 4.8–5.6)
Mean Plasma Glucose: 105 mg/dL

## 2015-05-30 MED ORDER — CEPHALEXIN 500 MG PO CAPS: 500.0000 mg | ORAL_CAPSULE | Freq: Four times a day (QID) | ORAL | Status: DC

## 2015-05-30 MED ORDER — CEPHALEXIN 500 MG PO CAPS
500.0000 mg | ORAL_CAPSULE | Freq: Four times a day (QID) | ORAL | Status: DC
  Administered 2015-05-30 (×2): 500 mg via ORAL
  Filled 2015-05-30 (×2): qty 1

## 2015-05-30 NOTE — Progress Notes (Addendum)
Patient Discharge: Disposition: Patient discharged home. Education: Reviewed about medications, prescriptions, follow-up appointments and discharge instructions, understood and acknowledged. IV: Discontinued IV before discharge. Telemetry: N/A Transportation: Patient transported out from facility in w/c with staff accompanying him. Belongings: Patient took all his belongings with him.  Patient's dressing on his right toe was changed before discharge.

## 2015-05-30 NOTE — Discharge Instructions (Signed)
1. You have a follow up appointment as follows:  Burgess Estelle, MD  On 06/05/2015 8:30 AM  [place] [address] [phone]  2. Please take all medications as previously prescribed with the following changes:  Take Keflex four times daily until completed (last day 06/04/15). Take only 3 times today.   3. If you have worsening of your symptoms or new symptoms arise, please call the clinic ([phone]), or go to the ER immediately if symptoms are severe.  You have done a great job in taking all your medications. Please continue to do this.

## 2015-05-30 NOTE — Progress Notes (Signed)
Subjective: Leg significantly improved, decreased tenderness, no longer warm to the touch. Still w/ slight induration on the superior border.   Objective: Vital signs in last 24 hours: Filed Vitals:   05/29/15 1000 05/29/15 1804 05/29/15 1959 05/30/15 0607  BP: 108/59 105/58 114/64 106/65  Pulse: 63 58 59 54  Temp: 99.3 F (37.4 C) 98.6 F (37 C) 98.7 F (37.1 C) 97.8 F (36.6 C)  TempSrc: Oral Oral    Resp: 18 18 18 20   Height:      Weight:   226 lb (102.513 kg)   SpO2: 100% 98% 100% 98%   Weight change: 13 lb (5.897 kg)  Intake/Output Summary (Last 24 hours) at 05/30/15 7858 Last data filed at 05/30/15 0600  Gross per 24 hour  Intake 2336.66 ml  Output    200 ml  Net 2136.66 ml   Physical Exam: General: AA male, alert, cooperative, NAD.  HEENT: PERRL, EOMI. Moist mucus membranes Neck: Full range of motion without pain, supple, no lymphadenopathy or carotid bruits Lungs: Clear to ascultation bilaterally, normal work of respiration, no wheezes, rales, rhonchi Heart: RRR, no murmurs, gallops, or rubs Abdomen: Soft, non-tender, non-distended, BS + Extremities: No cyanosis, clubbing, or edema. Right leg erythema improved overlying the knee medially. Still mild induration noted on superior portion of cellulitis. No longer warm to the touch.   Neurologic: Alert & oriented x3, cranial nerves II-XII intact, strength grossly intact, sensation intact to light touch.   Lab Results: Basic Metabolic Panel:  Recent Labs Lab 05/28/15 2230 05/29/15 0516  NA 134* 134*  K 3.6 3.5  CL 101 102  CO2 22 28  GLUCOSE 148* 98  BUN 6 8  CREATININE 1.02 0.88  CALCIUM 8.5* 8.3*   Liver Function Tests:  Recent Labs Lab 05/28/15 2230  AST 26  ALT 11*  ALKPHOS 57  BILITOT 0.6  PROT 6.4*  ALBUMIN 3.2*   CBC:  Recent Labs Lab 05/29/15 0516 05/30/15 0524  WBC 14.8* 7.6  NEUTROABS 12.2* 5.3  HGB 13.2 12.7*  HCT 39.4 39.0  MCV 89.5 90.5  PLT 132* 123*    D-Dimer:  Recent Labs Lab 05/29/15 0518  DDIMER 0.36    Studies/Results: Dg Toe 2nd Right  05/29/2015   CLINICAL DATA:  Open wound, evaluate for osteomyelitis  EXAM: RIGHT SECOND TOE  COMPARISON:  None.  FINDINGS: There is no evidence of fracture or dislocation. There is no evidence of arthropathy or other focal bone abnormality. Soft tissues are unremarkable.  IMPRESSION: Negative.   Electronically Signed   By: Conchita Paris M.D.   On: 05/29/2015 10:04   Medications: I have reviewed the patient's current medications. Scheduled Meds: . cephALEXin  500 mg Oral 4 times per day  . enoxaparin (LOVENOX) injection  40 mg Subcutaneous Q24H  . mupirocin cream   Topical Daily  . nicotine  14 mg Transdermal Daily   Continuous Infusions:   PRN Meds:.acetaminophen, HYDROcodone-acetaminophen   Assessment/Plan: 49 y/o M w/ PMHx of chronic venous stasis and lymphedema (L>R), previous DVT in LLE, OSA, and obesity, admitted for RLE cellulitis.   RLE Cellulitis: Significant improvement. No longer warm to the touch. Leukocytosis resolved.  -Transition to Keflex 500 mg q6h for total of 7 days (end date 06/04/15) -Follow up in clinic in 1 week.   Mild Thrombocytopenia: Platelets 123 today, slightly decreased from admission at 135. Unclear etiology. No signs of bleeding.  -Recheck CBC in clinic.   Tobacco Abuse: Nicoderm CQ  Dispo:  Anticipated discharge today.   The patient does have a current PCP Oval Linsey, MD) and does need an [facility] hospital follow-up appointment after discharge.  The patient does not have transportation limitations that hinder transportation to clinic appointments.  .Services Needed at time of discharge: Y = Yes, Blank = No PT:   OT:   RN:   Equipment:   Other:     LOS: 1 day   Corky Sox, MD 05/30/2015, 8:28 AM

## 2015-05-30 NOTE — Progress Notes (Signed)
Subjective: No acute events overnight. Continues to be afebrile with stable HR and BP. Feels improved and ready to go home. Reported new bumps around area of infection. No leg pain, chest pain, SOB, abd pain, or cough.   Objective: Vital signs in last 24 hours: Filed Vitals:   05/29/15 1000 05/29/15 1804 05/29/15 1959 05/30/15 0607  BP: 108/59 105/58 114/64 106/65  Pulse: 63 58 59 54  Temp: 99.3 F (37.4 C) 98.6 F (37 C) 98.7 F (37.1 C) 97.8 F (36.6 C)  TempSrc: Oral Oral    Resp: 18 18 18 20   Height:      Weight:   102.513 kg (226 lb)   SpO2: 100% 98% 100% 98%   Weight change: 5.897 kg (13 lb)  Intake/Output Summary (Last 24 hours) at 05/30/15 0739 Last data filed at 05/30/15 0600  Gross per 24 hour  Intake 2336.66 ml  Output    200 ml  Net 2136.66 ml    Physical Exam: General: Alert, cooperative, NAD. HEENT: EOMI. Moist mucus membranes Neck: Full range of motion without pain Lungs: Clear to ascultation bilaterally, normal work of respiration, no wheezes, rales, rhonchi Heart: RRR, soft 2/6 systolic flow murmur loudest at L/RUSB, no gallops or rubs Abdomen: Soft, non-tender, non-distended Extremities: Improved erythema and swelling of distal right thigh with area of induration with mild pain w/palpation. No longer warm to touch. New papules next to area of cellulitis on top of thigh, no erythema or discharge. Right second toe with large, dry, healing callus with no erythema, warmth or discharge, improved; significant lower left leg lymphedema, chronic. Dorsalis pedis pulses 2+ bilaterally. Neurologic: Alert & oriented X3, cranial nerves II-XII grossly intact, strength grossly intact, BLE sensation intact to light touch.  Recent Lab Results:  CBC:  Recent Labs Lab 05/28/15 2230 05/29/15 0516 05/30/15 0524  WBC 11.3* 14.8* 7.6  NEUTROABS 10.0* 12.2* 5.3  HGB 14.3 13.2 12.7*  HCT 41.9 39.4 39.0  MCV 89.1 89.5 90.5  PLT 135* 132* 123*    Micro  Results: Bcx x2 pending.   Studies/Results: None.  Other results: None.  Medications: I have reviewed the patient's current medications. Scheduled Meds: . cephALEXin  500 mg Oral 4 times per day  . enoxaparin (LOVENOX) injection  40 mg Subcutaneous Q24H  . mupirocin cream   Topical Daily  . nicotine  14 mg Transdermal Daily   Continuous Infusions: None PRN Meds:.acetaminophen, HYDROcodone-acetaminophen  Assessment/Plan: Oscar Brown is a 49 y.o. yo male with a pertinent PMHx of chronic venous insufficiency, left leg lymphedema 2/2 DVT in 1993, and current tobacco use, who was admitted on 05/28/2015 with right lower thigh and knee erythema, pain and swelling, fever, and an elevated WBC due to cellulitis most likely caused by strep. Wells score was 0 and D-dimer was negative.  Cellulitis of right leg Afebrile with resolution of symptoms and WBC decreased to 7.6 from 14.8 yesterday. Completed 4 doses of IV ancef. HbA1c was 5.3 so no concern of atypical infection associated with diabetes. No long requires pain medication. - Bcx x2 pending - STOP ancef 2g IV q8h - Start PO cephalexin 500mg  q6h x7days - tylenol prn for fever - discontinue norco 5-325 mg TID prn for pain  Stage 2 pressure injury to second right toe - not infected, closed and unlikely to become infection risk - per wound care, appreciated recs -- bactroban cream to wound bed and cover with 2x2 and tape.   Thrombocytopenia Pts 135 on admission and  trended down to 123 today. Likely a dilutional effect, however thrombocytopenia can also be induced by certain medications including ancef and vancomycin, which he received in the ED. Could be response to acute infection, however dropped while improving on abx. Keflex may also lower plt counts. This will most likely resolve with tx of the infection and after abx course is completed.  - Discharge the patient with return precautions. - Check CBC at f/u appt   OSA-- CPAP  qhs  Tobacco abuse-- smokes 1/2 PPD, cutting down - nicotine 14mg  patch  FEN - heart healthy diet - IV locked  - replete electolytes prn  DVT ppx - lovenox 40mg  sq daily  Dispo: Discharge to home today on PO abx.    This is a Careers information officer Note.  The care of the patient was discussed with Dr. Ronnald Ramp and the assessment and plan formulated with their assistance.  Please see their attached note for official documentation of the daily encounter.   LOS: 1 day   Donnetta Simpers, Med Student 05/30/2015, 7:39 AM

## 2015-05-30 NOTE — Discharge Summary (Signed)
Name: Oscar Brown MRN: [id number] DOB: 29-Jun-1966 49 y.o. PCP: Oval Linsey, MD  Date of Admission: 05/28/2015 11:59 PM Date of Discharge: 06/05/2015 Attending Physician: No att. providers found  Discharge Diagnosis: 1. RLE Cellulitis   Discharge Medications:   Medication List    Keflex 500 mg q6h          Disposition and follow-up:   Oscar Brown was discharged from [facility] in Good condition.  At the hospital follow up visit please address:  1.  Cellulitis: Sent home on Keflex. Improved? Doppler negative.   2.  Labs / imaging needed at time of follow-up: None  3.  Pending labs/ test needing follow-up: None  Follow-up Appointments: Follow-up Information    Follow up with Burgess Estelle, MD On 06/05/2015.   Specialty:  Internal Medicine   Why:  8:30 AM   Contact information:   [place] [id number] [phone]       Discharge Instructions:   Consultations:    Procedures Performed:  Dg Toe 2nd Right  05/29/2015   CLINICAL DATA:  Open wound, evaluate for osteomyelitis  EXAM: RIGHT SECOND TOE  COMPARISON:  None.  FINDINGS: There is no evidence of fracture or dislocation. There is no evidence of arthropathy or other focal bone abnormality. Soft tissues are unremarkable.  IMPRESSION: Negative.   Electronically Signed   By: Conchita Paris M.D.   On: 05/29/2015 10:04    Admission HPI: 49 y/o M w/ PMHx of chronic venous insufficiency 2/2 lt leg DVT in 1993, OSA, mitral regurgitation, and lymphedema who presents with rt thigh swelling and redness. 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 Rt leg pain was 10/10 when it first started and is currently a 5/10 in severity. He had a subjective fever and chills today. He denies calf swelling, night sweats, SOB, chest pain, cough, recent long periods of travevl, recent surgery or falls, or  hx of cancer. He smokes 1/2 PPD, and has had intentional weight loss. He has a hx of a left leg DVT in 1993 that he believes was due to trauma when he hit his left shin against his bed. His dad had a history of a blood clot but he is unsure where. After work yesterday he also experienced an episode of dizziness during which he felt the room was spinning that last 7-10 minutes and resolved when he laid down. He admits to poor po intake and being exhausted after work. Denies current dizziness. Started on vanc and given therapeutic dose of lovenox in the ED.  Hospital Course by problem list:  1. RLE cellulitis: Wells score was 0 and D-dimer was negative, so we treated for cellulitis. Following dose of vancomycin in the ED, he received 4 doses of IV ancef. Symptoms and leukocytosis resolved with antibiotics and fluids, and at discharge the area of erythema and swelling had reduced with no warmth. A small area of induration remained, but should improve with oral antibiotics. Blood cultures are pending. He was discharged on Keflex 500mg  PO q6h x7days and follow-up is scheduled for follow-up next week in our clinic. Work note was given to patient.  Discharge Vitals:   BP 117/69 mmHg  Pulse 53  Temp(Src) 97.9 F (36.6 C) (Oral)  Resp 20  Ht 5\' 8"  (1.727 m)  Wt 226 lb (102.513 kg)  BMI 34.37 kg/m2  SpO2  100%  Discharge Labs:  No results found for this or any previous visit (from the past 24 hour(s)).  Signed: Corky Sox, MD 06/05/2015, 8:33 PM    Services Ordered on Discharge: None Equipment Ordered on Discharge: None

## 2015-06-03 LAB — CULTURE, BLOOD (ROUTINE X 2)
CULTURE: NO GROWTH
CULTURE: NO GROWTH

## 2015-06-05 ENCOUNTER — Encounter: Payer: Self-pay | Admitting: Internal Medicine

## 2015-06-05 ENCOUNTER — Ambulatory Visit (INDEPENDENT_AMBULATORY_CARE_PROVIDER_SITE_OTHER): Payer: Managed Care, Other (non HMO) | Admitting: Internal Medicine

## 2015-06-05 VITALS — BP 96/57 | HR 59 | Temp 97.9°F | Ht 68.0 in | Wt 231.9 lb

## 2015-06-05 DIAGNOSIS — D696 Thrombocytopenia, unspecified: Secondary | ICD-10-CM

## 2015-06-05 DIAGNOSIS — X58XXXD Exposure to other specified factors, subsequent encounter: Secondary | ICD-10-CM

## 2015-06-05 DIAGNOSIS — S91104D Unspecified open wound of right lesser toe(s) without damage to nail, subsequent encounter: Secondary | ICD-10-CM

## 2015-06-05 DIAGNOSIS — F17200 Nicotine dependence, unspecified, uncomplicated: Secondary | ICD-10-CM | POA: Insufficient documentation

## 2015-06-05 DIAGNOSIS — L03115 Cellulitis of right lower limb: Secondary | ICD-10-CM | POA: Diagnosis not present

## 2015-06-05 DIAGNOSIS — I872 Venous insufficiency (chronic) (peripheral): Secondary | ICD-10-CM

## 2015-06-05 DIAGNOSIS — B9689 Other specified bacterial agents as the cause of diseases classified elsewhere: Secondary | ICD-10-CM

## 2015-06-05 MED ORDER — CEPHALEXIN 500 MG PO CAPS: 500.0000 mg | ORAL_CAPSULE | Freq: Four times a day (QID) | ORAL | Status: AC

## 2015-06-05 MED ORDER — CEPHALEXIN 500 MG PO CAPS: 500.0000 mg | ORAL_CAPSULE | Freq: Four times a day (QID) | ORAL | Status: DC

## 2015-06-05 NOTE — Assessment & Plan Note (Signed)
Pt uses compression stockings and feels that this is helping his edema related to venous insufficiency.  Continue current compression stockings

## 2015-06-05 NOTE — Patient Instructions (Signed)
Thank you for your visit today.  Please continue your antibiotic cephalexin for 5 more days every 6 hours (4 times a day)  Please return to clinic once you finish your antibiotic.  Please continue to put dressing and antibiotic cream on the toe.  Please call or come back to the clinic if you run fevers or if your symptoms worsen. Please go to the ER if the clinic is closed.  General Instructions:   Please bring your medicines with you each time you come to clinic.  Medicines may include prescription medications, over-the-counter medications, herbal remedies, eye drops, vitamins, or other pills.

## 2015-06-05 NOTE — Assessment & Plan Note (Signed)
Cellulitis of right thigh: improved since hospital admission, however the area is still red, and tender to palpation. Pt is on PO keflex which he has been taking for 5 days. He has only missed one dose and did not experience any side effects like diarrhea.  Plan is to extend PO keflex for 5 more days and pt is asked to come back to the clinic for further evaluation and management.

## 2015-06-05 NOTE — Progress Notes (Signed)
Patient ID: Oscar Brown, male   DOB: 10-27-1966, 49 y.o.   MRN: [id number]   Subjective:   Patient ID: Oscar Brown male   DOB: 06-Feb-1966 49 y.o.   MRN: [id number]  HPI: [name] is a 49 y.o. African American man who is here for a hospital follow up after being admitted last week for cellulitis of right medial thigh region that extended around the knee. He was given IV ancef, and vancomycin and then switched to 5 day course of PO keflex which he has been taking q6h. He has only missed one dose but otherwise is able to tolerate it well and has not experienced any side effects like diarrhea. He says that he is doing much better, has not had any fevers, but still has some redness, and tenderness in the area. In the hospital, he was also found to have right toe infection, and per wound care, he has been applying dressing and bactroban on the right toe, and says that has improved and is closing up. No discharge noted by patient.   Regarding his mild thrombocytopenia, it is most likely due to the recent course of antibiotics, or it can be dilutional. I checked his history, and previously last year, it was WNL. He did not experience any unusual bleeding.  He has notable PMH of chronic venous stasis and lymphedema, h/o DVT in 1993, OSA, and obesity. Since he did have a h/o DVT, D-Dimer test was done in the hospital and was found to be normal.    Past Medical History  Diagnosis Date  . 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  Complicated by chronic left lower extremity edema and occasional recurrent left lower extremity cellulitis   . Obstructive sleep apnea 12/14/2006    AHI 19.2/hr, desaturation to 75% on Polysomnography 02/26/2005.  Uses 11 cm H2O nocturnal nasal CPAP with good symptom control.   . Obesity, Class II, BMI 35-39.9 12/14/2006  . Mitral regurgitation 10/06/2012    On examination   . Lymphedema   . Fatty liver    Current Outpatient  Prescriptions  Medication Sig Dispense Refill  . cephALEXin (KEFLEX) 500 MG capsule Take 1 capsule (500 mg total) by mouth every 6 (six) hours. 28 capsule 0   No current facility-administered medications for this visit.   No family history on file. History   Social History  . Marital Status: Legally Separated    Spouse Name: N/A  . Number of Children: N/A  . Years of Education: N/A   Social History Main Topics  . Smoking status: Former Smoker -- 0.10 packs/day    Types: Cigarettes    Quit date: 09/05/2012  . Smokeless tobacco: Never Used  . Alcohol Use: No  . Drug Use: No  . Sexual Activity: Not on file   Other Topics Concern  . None   Social History Narrative   Review of Systems: Review of Systems  Constitutional: Negative for fever, chills and malaise/fatigue.  HENT: Negative.   Cardiovascular: Negative.        Vascular/lymph: uses compression stockings for his edema   Skin:       Skin/MSK:  On the Right medial thigh region around the knee, some swelling, redness and tenderness noted by patient, but it has improved considerably  Also on his second right toe, pt says it is much better and no discharge noted. Using bactroban and dressing  Endo/Heme/Allergies: Does not bruise/bleed easily.  Objective:  Physical Exam: Filed Vitals:   06/05/15 0843  BP: 96/57  Pulse: 59  Temp: 97.9 F (36.6 C)  TempSrc: Oral  Height: 5\' 8"  (1.727 m)  Weight: 231 lb 14.4 oz (105.189 kg)  SpO2: 100%  Physical Exam  Constitutional: He appears well-developed and well-nourished.  Neck: Neck supple.  Cardiovascular: Normal rate, regular rhythm and intact distal pulses.   Pulmonary/Chest: Effort normal and breath sounds normal.  Skin:  Skin/msk: On his right medial thigh, distinct area of some redness and tenderness/swelling noted. Not warm to touch. Comparing with the picture on hospital admission, it looks much better.    On his right toe: wound is closing up, white patch  noted, no discharge appreciated,    Extremities: 1+ pitting edema b/l, uses compression stockings, intact dorsalis pedis, posterior tibial, and popliteal pulses    Assessment & Plan:  Please see problem based charting for assessment and plan.

## 2015-06-05 NOTE — Assessment & Plan Note (Signed)
Wound of right foot toe: improved. No discharge or erythema noted.  Pt is applying bactroban and dressings.  Continue current regimen and f/u next week

## 2015-06-10 NOTE — Progress Notes (Signed)
Internal Medicine Clinic Attending  I saw and evaluated the patient.  I personally confirmed the key portions of the history and exam documented by Dr. Saraiya and I reviewed pertinent patient test results.  The assessment, diagnosis, and plan were formulated together and I agree with the documentation in the resident's note.  

## 2015-06-11 ENCOUNTER — Ambulatory Visit: Payer: Managed Care, Other (non HMO) | Admitting: Internal Medicine

## 2015-06-12 ENCOUNTER — Encounter: Payer: Self-pay | Admitting: Internal Medicine

## 2015-06-12 ENCOUNTER — Ambulatory Visit (INDEPENDENT_AMBULATORY_CARE_PROVIDER_SITE_OTHER): Payer: Managed Care, Other (non HMO) | Admitting: Internal Medicine

## 2015-06-12 VITALS — BP 109/68 | HR 57 | Temp 98.1°F | Ht 68.0 in | Wt 223.7 lb

## 2015-06-12 DIAGNOSIS — S91104D Unspecified open wound of right lesser toe(s) without damage to nail, subsequent encounter: Secondary | ICD-10-CM | POA: Diagnosis not present

## 2015-06-12 DIAGNOSIS — X58XXXD Exposure to other specified factors, subsequent encounter: Secondary | ICD-10-CM | POA: Diagnosis not present

## 2015-06-12 DIAGNOSIS — L03115 Cellulitis of right lower limb: Secondary | ICD-10-CM | POA: Diagnosis not present

## 2015-06-12 DIAGNOSIS — Z008 Encounter for other general examination: Secondary | ICD-10-CM

## 2015-06-12 NOTE — Patient Instructions (Signed)
Thank you for your visit today.  Please follow up with Dr. Eppie Gibson in March or February of Next Year  Please come back to the clinic as needed.  General Instructions:   Please bring your medicines with you each time you come to clinic.  Medicines may include prescription medications, over-the-counter medications, herbal remedies, eye drops, vitamins, or other pills.   Progress Toward Treatment Goals:  No flowsheet data found.  Self Care Goals & Plans:  No flowsheet data found.  No flowsheet data found.   Care Management & Community Referrals:  Referral 07/19/2014  Referrals made to community resources none

## 2015-06-12 NOTE — Progress Notes (Signed)
Internal Medicine Clinic Attending  I saw and evaluated the patient.  I personally confirmed the key portions of the history and exam documented by Dr. Saraiya and I reviewed pertinent patient test results.  The assessment, diagnosis, and plan were formulated together and I agree with the documentation in the resident's note.  

## 2015-06-12 NOTE — Progress Notes (Signed)
Patient ID: Oscar Brown, male   DOB: 08/19/1966, 49 y.o.   MRN: [id number]   Subjective:   Patient ID: Oscar Brown male   DOB: 01/07/1966 49 y.o.   MRN: [id number]  HPI: [name] is a 49 y.o. man who is here for a followup from last week. He was hospitalized 2 weeks ago for cellulitis of right thigh with systemic symptoms, and was given vanc and ancef, and was sent home on PO keflex. When I saw him for HFU visit last week, the right thigh still had some redness and swelling so I had continued his keflex for 7 more days. He comes in today and is feeling well, no systemic symptoms, no fevers. Says the redness and swelling is no longer there. Is taking his antibiotics- finishing last dose tomorrow, has not missed any dose and is able to tolerate it well.     Past Medical History  Diagnosis Date  . 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  Complicated by chronic left lower extremity edema and occasional recurrent left lower extremity cellulitis   . Obstructive sleep apnea 12/14/2006    AHI 19.2/hr, desaturation to 75% on Polysomnography 02/26/2005.  Uses 11 cm H2O nocturnal nasal CPAP with good symptom control.   . Obesity, Class II, BMI 35-39.9 12/14/2006  . Mitral regurgitation 10/06/2012    On examination   . Lymphedema   . Fatty liver    No current outpatient prescriptions on file.   No current facility-administered medications for this visit.   No family history on file. History   Social History  . Marital Status: Legally Separated    Spouse Name: N/A  . Number of Children: N/A  . Years of Education: N/A   Social History Main Topics  . Smoking status: Former Smoker -- 0.10 packs/day    Types: Cigarettes    Quit date: 09/05/2012  . Smokeless tobacco: Never Used  . Alcohol Use: No  . Drug Use: No  . Sexual Activity: Not on file   Other Topics Concern  . None   Social History Narrative   Review of Systems: Review of  Systems  Constitutional: Negative.  Negative for fever.  Respiratory: Negative.   Cardiovascular: Negative.   Skin: Negative.  Negative for rash.  Foot: says he changed his shoes, has a wound on medial side of right toe and says it has improved and closing up   Objective:  Physical Exam: Filed Vitals:   06/12/15 0847  BP: 109/68  Pulse: 57  Temp: 98.1 F (36.7 C)  TempSrc: Oral  Height: 5\' 8"  (1.727 m)  Weight: 223 lb 11.2 oz (101.47 kg)  SpO2: 100%   Physical Exam  Constitutional: He appears well-developed and well-nourished.  Neck: Normal range of motion. Neck supple.  Cardiovascular: Normal rate, regular rhythm, normal heart sounds and intact distal pulses.   Pulmonary/Chest: Effort normal and breath sounds normal. He has no wheezes.  Skin:  Right thigh: cellulitis has resolved. No redness, swelling or erythema noted. Not warm to touch   Right toe: wound looks better than last week. Pt is taking precautions for foot care.       Assessment & Plan:   Please see problem based charting for assessment and plan

## 2015-06-12 NOTE — Assessment & Plan Note (Signed)
Wound looks better than last time, and is closing up. No discharge or erythema noted. Feet also has calluses.  Pt is advised for good preventative foot care, including not walking on bare feet, and changing his dressings.   Referral made for podiatry.

## 2015-06-12 NOTE — Assessment & Plan Note (Signed)
Cellulitis of right thigh- resolved.  Follow up PRN , and follow up for routine visit in March, appt scheduled

## 2015-07-15 ENCOUNTER — Ambulatory Visit: Payer: Managed Care, Other (non HMO) | Admitting: Podiatry

## 2015-08-06 ENCOUNTER — Encounter: Payer: Self-pay | Admitting: *Deleted

## 2015-08-13 ENCOUNTER — Ambulatory Visit: Payer: Managed Care, Other (non HMO) | Admitting: Podiatry

## 2015-08-14 ENCOUNTER — Encounter: Payer: Managed Care, Other (non HMO) | Admitting: Internal Medicine

## 2015-08-15 ENCOUNTER — Encounter: Payer: Managed Care, Other (non HMO) | Admitting: Internal Medicine

## 2015-09-09 ENCOUNTER — Ambulatory Visit: Payer: Managed Care, Other (non HMO) | Admitting: Podiatry

## 2015-09-09 NOTE — Addendum Note (Signed)
Addended by: Hulan Fray on: 09/09/2015 07:07 PM   Modules accepted: Orders

## 2015-09-19 ENCOUNTER — Encounter: Payer: Self-pay | Admitting: Internal Medicine

## 2015-09-19 ENCOUNTER — Encounter: Payer: Managed Care, Other (non HMO) | Admitting: Internal Medicine

## 2015-09-21 ENCOUNTER — Observation Stay (HOSPITAL_COMMUNITY)
Admission: EM | Admit: 2015-09-21 | Discharge: 2015-09-23 | Disposition: A | Discharge status: Home / Self Care | Payer: Managed Care, Other (non HMO) | Attending: Student in an Organized Health Care Education/Training Program | Admitting: Student in an Organized Health Care Education/Training Program

## 2015-09-21 ENCOUNTER — Encounter (HOSPITAL_COMMUNITY): Payer: Self-pay | Admitting: *Deleted

## 2015-09-21 ENCOUNTER — Emergency Department (HOSPITAL_COMMUNITY): Payer: Managed Care, Other (non HMO)

## 2015-09-21 DIAGNOSIS — I890 Lymphedema, not elsewhere classified: Secondary | ICD-10-CM | POA: Diagnosis not present

## 2015-09-21 DIAGNOSIS — G4733 Obstructive sleep apnea (adult) (pediatric): Secondary | ICD-10-CM | POA: Insufficient documentation

## 2015-09-21 DIAGNOSIS — Z23 Encounter for immunization: Secondary | ICD-10-CM | POA: Insufficient documentation

## 2015-09-21 DIAGNOSIS — R05 Cough: Secondary | ICD-10-CM

## 2015-09-21 DIAGNOSIS — I5031 Acute diastolic (congestive) heart failure: Secondary | ICD-10-CM

## 2015-09-21 DIAGNOSIS — Z86718 Personal history of other venous thrombosis and embolism: Secondary | ICD-10-CM | POA: Insufficient documentation

## 2015-09-21 DIAGNOSIS — Z79899 Other long term (current) drug therapy: Secondary | ICD-10-CM | POA: Diagnosis not present

## 2015-09-21 DIAGNOSIS — R0600 Dyspnea, unspecified: Secondary | ICD-10-CM | POA: Diagnosis present

## 2015-09-21 DIAGNOSIS — I272 Other secondary pulmonary hypertension: Secondary | ICD-10-CM | POA: Insufficient documentation

## 2015-09-21 DIAGNOSIS — I340 Nonrheumatic mitral (valve) insufficiency: Principal | ICD-10-CM | POA: Insufficient documentation

## 2015-09-21 DIAGNOSIS — Z87891 Personal history of nicotine dependence: Secondary | ICD-10-CM | POA: Diagnosis not present

## 2015-09-21 DIAGNOSIS — M7989 Other specified soft tissue disorders: Secondary | ICD-10-CM | POA: Diagnosis not present

## 2015-09-21 DIAGNOSIS — I872 Venous insufficiency (chronic) (peripheral): Secondary | ICD-10-CM | POA: Insufficient documentation

## 2015-09-21 DIAGNOSIS — R059 Cough, unspecified: Secondary | ICD-10-CM

## 2015-09-21 HISTORY — DX: Acute diastolic (congestive) heart failure: I50.31

## 2015-09-21 LAB — CBC
HCT: 39.8 % (ref 39.0–52.0)
Hemoglobin: 13.2 g/dL (ref 13.0–17.0)
MCH: 29.7 pg (ref 26.0–34.0)
MCHC: 33.2 g/dL (ref 30.0–36.0)
MCV: 89.4 fL (ref 78.0–100.0)
PLATELETS: 191 10*3/uL (ref 150–400)
RBC: 4.45 MIL/uL (ref 4.22–5.81)
RDW: 13.2 % (ref 11.5–15.5)
WBC: 5.9 10*3/uL (ref 4.0–10.5)

## 2015-09-21 LAB — BASIC METABOLIC PANEL
Anion gap: 7 (ref 5–15)
BUN: 10 mg/dL (ref 6–20)
CALCIUM: 8.6 mg/dL — AB (ref 8.9–10.3)
CO2: 23 mmol/L (ref 22–32)
CREATININE: 0.8 mg/dL (ref 0.61–1.24)
Chloride: 105 mmol/L (ref 101–111)
GFR calc Af Amer: >60 mL/min (ref >60)
GLUCOSE: 127 mg/dL — AB (ref 65–99)
Potassium: 3.6 mmol/L (ref 3.5–5.1)
Sodium: 135 mmol/L (ref 135–145)

## 2015-09-21 LAB — TROPONIN I

## 2015-09-21 MED ORDER — FUROSEMIDE 10 MG/ML IJ SOLN
40.0000 mg | Freq: Once | INTRAMUSCULAR | Status: AC
  Administered 2015-09-21: 40 mg via INTRAVENOUS
  Filled 2015-09-21: qty 4

## 2015-09-21 NOTE — ED Provider Notes (Signed)
CSN: [id number]     Arrival date & time 09/21/15  1901 History   First MD Initiated Contact with Patient 09/21/15 2053     Chief Complaint  Patient presents with  . Cough     (Consider location/radiation/quality/duration/timing/severity/associated sxs/prior Treatment) Patient is a 49 y.o. male presenting with cough. The history is provided by the patient.  Cough Cough characteristics:  Non-productive Severity:  Moderate Onset quality:  Gradual Timing:  Constant Progression:  Unchanged Chronicity:  New Relieved by:  Nothing Ineffective treatments:  Cough suppressants Associated symptoms: no chest pain, no chills, no fever, no rash, no rhinorrhea and no shortness of breath     Oscar Brown is a 49 yo M PMH obesity, oSA, Mitral regurgitation, and Chronic venous insufficiency that is presenting with cough. He has had a nonproductive cough for the past 3 days. He has some chest tightness and feels like it's hard for him to breathe. Worse at night. Has tried NyQuil but with no improvement. He denies any PND or orthopnea. He denies any sick contacts, fevers, or chills. He denies any new or change in his medications. He denies any travel. He is a social drinker and has no illicit drug use. Is a formal tobacco user. He denies any chest pain, nausea, vomiting, diaphoresis, fever, chills, constipation, diarrhea, or dysuria. He does report working outside in his yard over the past few days.  Past Medical History  Diagnosis Date  . 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  Complicated by chronic left lower extremity edema and occasional recurrent left lower extremity cellulitis   . Obstructive sleep apnea 12/14/2006    AHI 19.2/hr, desaturation to 75% on Polysomnography 02/26/2005.  Uses 11 cm H2O nocturnal nasal CPAP with good symptom control.   . Obesity, Class II, BMI 35-39.9 12/14/2006  . Mitral regurgitation 10/06/2012    On examination   . Lymphedema   . Fatty  liver    History reviewed. No pertinent past surgical history. No family history on file. Social History  Substance Use Topics  . Smoking status: Former Smoker -- 0.10 packs/day    Types: Cigarettes    Quit date: 09/05/2012  . Smokeless tobacco: Never Used  . Alcohol Use: No    Review of Systems  Constitutional: Negative for fever and chills.  HENT: Negative for rhinorrhea.   Respiratory: Positive for cough. Negative for shortness of breath.   Cardiovascular: Negative for chest pain.  Gastrointestinal: Negative for nausea, vomiting, abdominal pain, diarrhea and constipation.  Genitourinary: Negative for dysuria.  Musculoskeletal: Negative for joint swelling.  Skin: Negative for rash.  Neurological: Negative for light-headedness.      Allergies  Review of patient's allergies indicates no known allergies.  Home Medications   Prior to Admission medications   Not on File   BP 122/75 mmHg  Pulse 56  Temp(Src) 98.2 F (36.8 C) (Oral)  Resp 22  Ht 5\' 8"  (1.727 m)  Wt 225 lb (102.059 kg)  BMI 34.22 kg/m2  SpO2 99% Physical Exam  Constitutional: He is oriented to person, place, and time. He appears well-developed and well-nourished.  HENT:  Head: Normocephalic and atraumatic.  Eyes: Conjunctivae and EOM are normal.  Neck: Normal range of motion. Neck supple.  Cardiovascular: Normal rate, regular rhythm and intact distal pulses.   Murmur heard. Pulmonary/Chest: Effort normal. No respiratory distress. He has no wheezes. He has rales in the right lower field and the left lower field.  Abdominal:  Soft. Bowel sounds are normal. He exhibits no distension. There is no tenderness. There is no rebound.  Musculoskeletal: Normal range of motion.  Compression stockings b/l   Neurological: He is alert and oriented to person, place, and time.  Skin: Skin is warm. No rash noted.  Psychiatric: He has a normal mood and affect.    ED Course  Procedures (including critical care  time) Labs Review Labs Reviewed  BASIC METABOLIC PANEL - Abnormal; Notable for the following:    Glucose, Bld 127 (*)    Calcium 8.6 (*)    All other components within normal limits  CBC  TROPONIN I  BRAIN NATRIURETIC PEPTIDE    Imaging Review Dg Chest 2 View  09/21/2015  CLINICAL DATA:  Cough and shortness of breath. EXAM: CHEST  2 VIEW COMPARISON:  CT scan abdomen scout image dated 06/13/2014 FINDINGS: There is borderline cardiomegaly. There is slight pulmonary vascular prominence with diffuse interstitial accentuation consistent with either pulmonary edema or diffuse interstitial pneumonitis. No discrete effusions. Slight thickening of the fissures. No osseous abnormality. IMPRESSION: Interstitial edema bilaterally, most likely pulmonary edema. Electronically Signed   By: Lorriane Shire M.D.   On: 09/21/2015 19:58   I have personally reviewed and evaluated these images and lab results as part of my medical decision-making.   EKG Interpretation None      Medications  furosemide (LASIX) injection 40 mg (40 mg Intravenous Given 09/21/15 2159)    MDM   Final diagnoses:  Cough   Oscar Brown is a 49 yo M that is presenting with persistant non productive cough.  CXR revealing for pulmonary edema. Troponin normal. BNP is pending.  Cough significantly improved with lasix. He has a history of Mitral regurgitation and symptoms most likely related to new onset heart failure. Admit to internal medicine teaching service.    Rosemarie Ax, MD PGY-3, [facility] 09/21/2015, 11:41 PM      Rosemarie Ax, MD 09/21/15 8309  Leonard Schwartz, MD 09/25/15 [phone]

## 2015-09-21 NOTE — ED Notes (Signed)
The pt is c/o a cough and diff breathing for the past 3 days.  He deines any lung problems  He has sleep apnea and he stopped smoking  One month ago he has on a patch.  Non-productive cough

## 2015-09-22 ENCOUNTER — Observation Stay (HOSPITAL_BASED_OUTPATIENT_CLINIC_OR_DEPARTMENT_OTHER): Payer: Managed Care, Other (non HMO)

## 2015-09-22 ENCOUNTER — Other Ambulatory Visit: Payer: Self-pay

## 2015-09-22 DIAGNOSIS — R05 Cough: Secondary | ICD-10-CM

## 2015-09-22 DIAGNOSIS — R0600 Dyspnea, unspecified: Secondary | ICD-10-CM

## 2015-09-22 LAB — LIPID PANEL
CHOLESTEROL: 187 mg/dL (ref 0–200)
HDL: 60 mg/dL (ref >40)
LDL CALC: 116 mg/dL — AB (ref 0–99)
TRIGLYCERIDES: 54 mg/dL (ref <150)
Total CHOL/HDL Ratio: 3.1 RATIO
VLDL: 11 mg/dL (ref 0–40)

## 2015-09-22 LAB — TROPONIN I: Troponin I: <0.03 ng/mL (ref <0.031)

## 2015-09-22 LAB — BRAIN NATRIURETIC PEPTIDE: B Natriuretic Peptide: 282 pg/mL — ABNORMAL HIGH (ref 0.0–100.0)

## 2015-09-22 MED ORDER — NICOTINE 14 MG/24HR TD PT24
14.0000 mg | MEDICATED_PATCH | Freq: Every day | TRANSDERMAL | Status: DC
  Administered 2015-09-22 – 2015-09-23 (×2): 14 mg via TRANSDERMAL
  Filled 2015-09-22 (×2): qty 1

## 2015-09-22 MED ORDER — SODIUM CHLORIDE 0.9 % IJ SOLN
3.0000 mL | Freq: Two times a day (BID) | INTRAMUSCULAR | Status: DC
  Administered 2015-09-22: 3 mL via INTRAVENOUS

## 2015-09-22 MED ORDER — FUROSEMIDE 40 MG PO TABS
40.0000 mg | ORAL_TABLET | Freq: Every day | ORAL | Status: DC
  Administered 2015-09-22 – 2015-09-23 (×2): 40 mg via ORAL
  Filled 2015-09-22 (×2): qty 1

## 2015-09-22 MED ORDER — ENOXAPARIN SODIUM 40 MG/0.4ML ~~LOC~~ SOLN
40.0000 mg | SUBCUTANEOUS | Status: DC
  Administered 2015-09-22 – 2015-09-23 (×2): 40 mg via SUBCUTANEOUS
  Filled 2015-09-22 (×2): qty 0.4

## 2015-09-22 MED ORDER — SODIUM CHLORIDE 0.9 % IJ SOLN
3.0000 mL | Freq: Two times a day (BID) | INTRAMUSCULAR | Status: DC
  Administered 2015-09-22 – 2015-09-23 (×3): 3 mL via INTRAVENOUS

## 2015-09-22 MED ORDER — SODIUM CHLORIDE 0.9 % IV SOLN: 250.0000 mL | INTRAVENOUS | Status: DC | PRN

## 2015-09-22 MED ORDER — ACETAMINOPHEN 325 MG PO TABS: 650.0000 mg | ORAL_TABLET | ORAL | Status: DC | PRN

## 2015-09-22 MED ORDER — ASPIRIN EC 81 MG PO TBEC: 81.0000 mg | DELAYED_RELEASE_TABLET | Freq: Every day | ORAL | Status: DC

## 2015-09-22 MED ORDER — ONDANSETRON HCL 4 MG/2ML IJ SOLN: 4.0000 mg | Freq: Four times a day (QID) | INTRAMUSCULAR | Status: DC | PRN

## 2015-09-22 MED ORDER — SODIUM CHLORIDE 0.9 % IJ SOLN: 3.0000 mL | INTRAMUSCULAR | Status: DC | PRN

## 2015-09-22 NOTE — Procedures (Signed)
Pt has home CPAP and will place on self when ready.  Instructed to notify RT is assistance is needed.

## 2015-09-22 NOTE — Progress Notes (Signed)
  Echocardiogram 2D Echocardiogram has been performed.  Oscar Brown 09/22/2015, 11:28 AM

## 2015-09-22 NOTE — Evaluation (Signed)
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 Pt does report that he likes to eat fried foods and eats canned vegetables. Pt educated for healthy eating, walking program, and decreased sodium intake, pt verbalized understanding. Pt currently at baseline functional status without SOB or need for assist with mobility. All education complete and pt encouraged to continue walking acutely. Will sign off with pt aware and agreeable.     Follow Up Recommendations No PT follow up    Equipment Recommendations  None recommended by PT    Recommendations for Other Services       Precautions / Restrictions Precautions Precautions: None      Mobility  Bed Mobility Overal bed mobility: Modified Independent                Transfers Overall transfer level: Modified independent                  Ambulation/Gait Ambulation/Gait assistance: Independent Ambulation Distance (Feet): 700 Feet Assistive device: None Gait Pattern/deviations: WFL(Within Functional Limits)   Gait velocity interpretation: at or above normal speed for age/gender    Stairs Stairs: Yes Stairs assistance: Independent Stair Management: No rails;Alternating pattern;Forwards Number of Stairs: 11    Wheelchair Mobility    Modified Rankin (Stroke Patients Only)       Balance Overall balance assessment: No apparent balance deficits (not formally assessed)                                           Pertinent Vitals/Pain Pain Assessment: No/denies pain    Home Living Family/patient expects to be  discharged to:: Private residence Living Arrangements: Alone   Type of Home: House Home Access: Stairs to enter Entrance Stairs-Rails: None Entrance Stairs-Number of Steps: 4 Home Layout: One level Home Equipment: None      Prior Function Level of Independence: Independent         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 [employer]        Extremity/Trunk Assessment   Upper Extremity Assessment: Overall WFL for tasks assessed           Lower Extremity Assessment: Overall WFL for tasks assessed      Cervical / Trunk Assessment: Normal  Communication   Communication: No difficulties  Cognition Arousal/Alertness: Awake/alert Behavior During Therapy: WFL for tasks assessed/performed Overall Cognitive Status: Within Functional Limits for tasks assessed                      General Comments      Exercises        Assessment/Plan    PT Assessment Patent does not need any further PT services  PT Diagnosis Other (comment) (asked to evaluate pt for SOB)   PT Problem List    PT Treatment Interventions     PT Goals (Current goals can be found in the Care Plan section) Acute Rehab PT Goals PT Goal Formulation: All assessment and education complete, DC therapy    Frequency  Barriers to discharge        Co-evaluation               End of Session   Activity Tolerance: Patient tolerated treatment well Patient left: in chair;with call bell/phone within reach Nurse Communication: Mobility status    Functional Assessment Tool Used: clinical judgement Functional Limitation: Mobility: Walking and moving around Mobility: Walking and Moving Around Current Status [phone]): 0 percent impaired, limited or restricted Mobility: Walking and Moving Around Goal Status [phone]): 0 percent impaired, limited or restricted Mobility: Walking and Moving Around Discharge Status [phone]): 0 percent impaired, limited or restricted    Time:  1355-1411 PT Time Calculation (min) (ACUTE ONLY): 16 min   Charges:   PT Evaluation $Initial PT Evaluation Tier I: 1 Procedure     PT G Codes:   PT G-Codes **NOT FOR INPATIENT CLASS** Functional Assessment Tool Used: clinical judgement Functional Limitation: Mobility: Walking and moving around Mobility: Walking and Moving Around Current Status (H6314): 0 percent impaired, limited or restricted Mobility: Walking and Moving Around Goal Status (H7026): 0 percent impaired, limited or restricted Mobility: Walking and Moving Around Discharge Status (V7858): 0 percent impaired, limited or restricted    Melford Aase 09/22/2015, 2:22 PM Elwyn Reach, [place]

## 2015-09-22 NOTE — Progress Notes (Signed)
SATURATION QUALIFICATIONS:   Patient Saturations on Room Air at Rest = 100%  Patient Saturations on Room Air while Ambulating = 94%  Patient Saturations on RA 98% at rest.

## 2015-09-22 NOTE — Plan of Care (Signed)
Problem: Phase I Progression Outcomes Goal: EF % per last Echo/documented,Core Reminder form on chart 09/22/15 60%

## 2015-09-22 NOTE — Progress Notes (Signed)
Subjective: Oscar Brown is a 49yo M with PMH OSA, mitral regurgitation and chronic venous insuffiencey and lymphedema from DVT in 1993 who is admitted for shortness of breath. He had no acute events overnight and feels much better after admission yesterday and receiving lasix 40mg  one time. He any recurrence of shortness of breath or any other symptoms at this time.  Objective: Vital signs in last 24 hours: Filed Vitals:   09/22/15 0005 09/22/15 0152 09/22/15 0628 09/22/15 0857  BP: 128/91 133/87 132/80 119/87  Pulse: 62 60 58 61  Temp:  98 F (36.7 C) 98.2 F (36.8 C) 98.6 F (37 C)  TempSrc:  Oral Oral Oral  Resp: 16 18 17 18   Height:  5\' 8"  (1.727 m)    Weight:  213 lb 12.8 oz (96.979 kg)    SpO2: 100% 98% 100% 100%   Weight change:   Intake/Output Summary (Last 24 hours) at 09/22/15 0931 Last data filed at 09/22/15 0705  Gross per 24 hour  Intake    240 ml  Output   1100 ml  Net   -860 ml   BP 119/87 mmHg  Pulse 61  Temp(Src) 98.6 F (37 C) (Oral)  Resp 18  Ht 5\' 8"  (1.727 m)  Wt 213 lb 12.8 oz (96.979 kg)  BMI 32.52 kg/m2  SpO2 100%  General Appearance:    Alert, cooperative, no distress, appears stated age  Lungs:     Clear to auscultation bilaterally, respirations unlabored  Chest wall:    No tenderness or deformity  Heart:    Regular rate and rhythm, S1 and S2 normal, there is a grade 3/6 holosystolic murmur heard best at the apex that radiates to the axilla. No rubs or gallops heard.  Abdomen:     Soft, non-tender, bowel sounds active all four quadrants,    no masses, no organomegaly  Extremities:   Extremities normal, atraumatic, no cyanosis or edema  Pulses:   2+ and symmetric all extremities  Skin:   Skin color, texture, turgor normal, no rashes or lesions  Lymph nodes:   Cervical, supraclavicular, and axillary nodes normal  Neurologic:   CNII-XII intact. Normal strength, sensation and reflexes      throughout   Lab Results: Basic Metabolic  Panel:  Recent Labs Lab 09/21/15 1940  NA 135  K 3.6  CL 105  CO2 23  GLUCOSE 127*  BUN 10  CREATININE 0.80  CALCIUM 8.6*  CBC:  Recent Labs Lab 09/21/15 1940  WBC 5.9  HGB 13.2  HCT 39.8  MCV 89.4  PLT 191   Cardiac Enzymes:  Recent Labs Lab 09/21/15 1940  TROPONINI <0.03   Studies/Results: Dg Chest 2 View  09/21/2015  CLINICAL DATA:  Cough and shortness of breath. EXAM: CHEST  2 VIEW COMPARISON:  CT scan abdomen scout image dated 06/13/2014 FINDINGS: There is borderline cardiomegaly. There is slight pulmonary vascular prominence with diffuse interstitial accentuation consistent with either pulmonary edema or diffuse interstitial pneumonitis. No discrete effusions. Slight thickening of the fissures. No osseous abnormality. IMPRESSION: Interstitial edema bilaterally, most likely pulmonary edema. Electronically Signed   By: Lorriane Shire M.D.   On: 09/21/2015 19:58   Assessment/Plan: Active Problems:   Shortness of breath   Dyspnea  1.Shortness of breath - Patient presents with progressive dyspnea, however not worsened by exertion. CXR findings, LAE on EKG, history of MR and responsiveness to lasix all suggest underlying cause of his dyspnea may be from worsening of his MR,  possibly worsened by non-compliance to his CPAP machine at home as the most likely causes on this broad differential. -Lasix 40mg  PO PRN -TTE today -Ambulate with O2 sats - if patient is able to ambulate without desaturating and getting significantly SOB, may be able to discharge today as patient is much improved clinically  Dispo: Disposition is deferred at this time, awaiting improvement of current medical problems.  Anticipated discharge in approximately 1 day(s).   The patient does have a current PCP Oval Linsey, MD) and does need an [facility] hospital follow-up appointment after discharge.  The patient does not have transportation limitations that hinder transportation to clinic  appointments.   LOS: 1 day   Norval Gable, MD 09/22/2015, 9:31 AM

## 2015-09-22 NOTE — H&P (Signed)
Date: 09/22/2015               Patient Name:  Oscar Brown MRN: [id number]  DOB: 1966-08-10 Age / Sex: 49 y.o., male   PCP: Oval Linsey, MD         Medical Service: Internal Medicine Teaching Service         Attending Physician: Dr. Axel Filler, MD    First Contact: Dr. Gwenlyn Fudge, MD Pager: [phone]  Second Contact: Dr. Michail Jewels, MD Pager: [phone]       After Hours (After 5p/  First Contact Pager: [phone]  weekends / holidays): Second Contact Pager: [phone]   Chief Complaint: Shortness of Breath  History of Present Illness:  Oscar Brown is a 49 year old gentleman with a past medical history of mitral regurgitation, OSA, chronic venous insufficiency (secondary to DVT in 1993), and lymphedema who presents with shortness of breath. 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 Because of this, he is often restless, gets up, and forgets to put his CPAP back on. He requires 3 pillows at night. It is associated with a dry cough, and he reports coughing so much that his chest feels sore. She also complains of a chest "tightness" when he lies down to sleep, but no chest pain. He denies any dyspnea on exertion, saying that he is able to work a full night at [employer] then work in his yard and others' yards for pay. He endorses that he occasionally feels a racing heart that seems out of rhythm - this is longstanding. He has had a significant intentional weight loss in the past year through diet changes from 280 lbs to 214 lbs. He has also had one episode in the last week of light-headedness upon standing. He has chronic swelling in his legs, but this is from longstanding venous insufficiency and lymphedema. Otherwise, he denies any fever, headaches in the morning or otherwise, loss of consciousness, new one-sided weakness, sore throat, PND, wheezing,  nausea, vomiting, diarrhea, or constipation. He takes no medications. He is a long-time smoker, but is currently trying to quite with nicotine patches. He says he still smokes and drink occasionally, denying any illicit drug use. He lives alone. Family history pertinent early death of his father from heart disease. In the ED, a CXR demonstrated pulmonary edema, normal i-Stat troponin, and BNP of 282. His shortness of breath significantly improved after a dose of lasix.  Meds: Current Facility-Administered Medications  Medication Dose Route Frequency Provider Last Rate Last Dose  . 0.9 %  sodium chloride infusion  250 mL Intravenous PRN Rushil Sherrye Payor, MD      . acetaminophen (TYLENOL) tablet 650 mg  650 mg Oral Q4H PRN Rushil Sherrye Payor, MD      . enoxaparin (LOVENOX) injection 40 mg  40 mg Subcutaneous Q24H Rushil Patel V, MD      . nicotine (NICODERM CQ - dosed in mg/24 hours) patch 14 mg  14 mg Transdermal Daily Rushil Patel V, MD      . ondansetron (ZOFRAN) injection 4 mg  4 mg Intravenous Q6H PRN Rushil Patel V, MD      . sodium chloride 0.9 % injection 3 mL  3 mL Intravenous Q12H Rushil Patel V, MD      . sodium chloride 0.9 % injection 3 mL  3 mL  Intravenous PRN Riccardo Dubin, MD        Allergies: Allergies as of 09/21/2015  . (No Known Allergies)   Past Medical History  Diagnosis Date  . 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  Complicated by chronic left lower extremity edema and occasional recurrent left lower extremity cellulitis   . Obstructive sleep apnea 12/14/2006    AHI 19.2/hr, desaturation to 75% on Polysomnography 02/26/2005.  Uses 11 cm H2O nocturnal nasal CPAP with good symptom control.   . Obesity, Class II, BMI 35-39.9 12/14/2006  . Mitral regurgitation 10/06/2012    On examination   . Lymphedema   . Fatty liver    History reviewed. No pertinent past surgical history. No family history on file. Social History   Social History  .  Marital Status: Legally Separated    Spouse Name: N/A  . Number of Children: N/A  . Years of Education: N/A   Occupational History  . Not on file.   Social History Main Topics  . Smoking status: Former Smoker -- 0.10 packs/day    Types: Cigarettes    Quit date: 09/05/2012  . Smokeless tobacco: Never Used  . Alcohol Use: No  . Drug Use: No  . Sexual Activity: Not on file   Other Topics Concern  . Not on file   Social History Narrative    Review of Systems: Negative except per HPI  Physical Exam: Blood pressure 133/87, pulse 60, temperature 98 F (36.7 C), temperature source Oral, resp. rate 18, height 5\' 8"  (1.727 m), weight 213 lb 12.8 oz (96.979 kg), SpO2 98 %. General: Lying in bed, no acute distress HEENT: Moist mucous membranes. No tonsillar erythema or exudates. PERRL, EOMI, No JVD, no cervical adenopathy Cardiovascular/Chest: 2/6 holosystolic murmur heard best at LLS, no extra heart sounds heard. Mild tenderness to palpation of the chest Pulmonary:  Bibasilar crackles. Normal work of breathing. No wheezing Abdomen: Soft, NTND Extremities:  Symmetric 1+ edema bilaterally. No clubbing or cyanosis. No focal calf tenderness. Skin: No rashes. Warm and dry Neurological: Tongue midline, face symmetric. 5/5 strength in all extremities   Lab results: Basic Metabolic Panel:  Recent Labs  09/21/15 1940  NA 135  K 3.6  CL 105  CO2 23  GLUCOSE 127*  BUN 10  CREATININE 0.80  CALCIUM 8.6*   CBC:  Recent Labs  09/21/15 1940  WBC 5.9  HGB 13.2  HCT 39.8  MCV 89.4  PLT 191   Cardiac Enzymes:  Recent Labs  09/21/15 1940  TROPONINI <0.03     Imaging results:  Dg Chest 2 View  09/21/2015  CLINICAL DATA:  Cough and shortness of breath. EXAM: CHEST  2 VIEW COMPARISON:  CT scan abdomen scout image dated 06/13/2014 FINDINGS: There is borderline cardiomegaly. There is slight pulmonary vascular prominence with diffuse interstitial accentuation consistent with  either pulmonary edema or diffuse interstitial pneumonitis. No discrete effusions. Slight thickening of the fissures. No osseous abnormality. IMPRESSION: Interstitial edema bilaterally, most likely pulmonary edema. Electronically Signed   By: Lorriane Shire M.D.   On: 09/21/2015 19:58    EKG: Left atrial enlargement, normal sinus rhythm  Assessment & Plan by Problem:  Shortness of Breath: Differential includes new onset CHF, CPAP non-adherence, worsening mitral regurgitation. Lack of dyspnea on exertion, however, is inconsistent with CHF. Low suspicion for PE with Geneva score of 3 (low risk), only positive factor being a previous DVT. No leukocytosis, fever, consolidation on CXR  so no concern for infection at this time. The differential for pulmonary edema on CXR is broad, cardiogenic (e.g., CHF, MR) or non-cardiogenic. Left atrial enlargement on EKG consistent with MR. His dyspnea seemed to be responsive to Lasix. - TTE - Repeat 2-view CXR - along with clinical assessment, determine need for further diuresis - Strict I/O's, daily weights - Repeat EKG  OSA: CPAP when sleeping  Tobacco Abuse: 14 mg nicotine patch  DVT prophylaxis: Lovenox [place]  Code Status: Full  Dispo: Disposition is deferred at this time, awaiting improvement of current medical problems. Anticipated discharge in approximately 2-3 day(s).   The patient does have a current PCP Oval Linsey, MD) and does need an [facility] hospital follow-up appointment after discharge.  The patient does not have transportation limitations that hinder transportation to clinic appointments.  Signed: Liberty Handy, MD 09/22/2015, 3:00 AM

## 2015-09-23 ENCOUNTER — Encounter: Payer: Self-pay | Admitting: Internal Medicine

## 2015-09-23 DIAGNOSIS — R0602 Shortness of breath: Secondary | ICD-10-CM | POA: Diagnosis not present

## 2015-09-23 DIAGNOSIS — R0600 Dyspnea, unspecified: Secondary | ICD-10-CM

## 2015-09-23 LAB — BASIC METABOLIC PANEL
Anion gap: 4 — ABNORMAL LOW (ref 5–15)
BUN: 11 mg/dL (ref 6–20)
CHLORIDE: 108 mmol/L (ref 101–111)
CO2: 28 mmol/L (ref 22–32)
Calcium: 9.1 mg/dL (ref 8.9–10.3)
Creatinine, Ser: 0.88 mg/dL (ref 0.61–1.24)
GFR calc non Af Amer: >60 mL/min (ref >60)
Glucose, Bld: 98 mg/dL (ref 65–99)
POTASSIUM: 3.9 mmol/L (ref 3.5–5.1)
SODIUM: 140 mmol/L (ref 135–145)

## 2015-09-23 LAB — HIV ANTIBODY (ROUTINE TESTING W REFLEX): HIV Screen 4th Generation wRfx: NONREACTIVE

## 2015-09-23 MED ORDER — INFLUENZA VAC SPLIT QUAD 0.5 ML IM SUSY: 0.5000 mL | PREFILLED_SYRINGE | Freq: Once | INTRAMUSCULAR | Status: DC

## 2015-09-23 MED ORDER — FUROSEMIDE 20 MG PO TABS: 20.0000 mg | ORAL_TABLET | Freq: Every day | ORAL | Status: DC

## 2015-09-23 MED ORDER — INFLUENZA VAC SPLIT QUAD 0.5 ML IM SUSY
0.5000 mL | PREFILLED_SYRINGE | Freq: Once | INTRAMUSCULAR | Status: AC
  Administered 2015-09-23: 0.5 mL via INTRAMUSCULAR
  Filled 2015-09-23: qty 0.5

## 2015-09-23 NOTE — Progress Notes (Signed)
Patient discharge to home accompanied by NT and family members. Discharge instructions given. Patient verbalizes understanding. All personal belongings  Given. Denies any pain. No signs and symptoms of respiratory distress noted.

## 2015-09-23 NOTE — Evaluation (Signed)
Occupational Therapy Evaluation Patient Details Name: Oscar Brown MRN: [id number] DOB: 1966/01/06 Today's Date: 09/23/2015    History of Present Illness 49 year old man with obstructive sleep apnea, mitral regurgitation, lymphedema and overweight, who is admitted because of progressive orthopnea and PND   Clinical Impression   Pt demonstrated ability to perform ADL and ADL transfers at an independent to modified independent level.  Educated pt in energy conservation, importance of daily weights, diet.  Pt verbalizing understanding of all. No further OT needs.    Follow Up Recommendations  No OT follow up    Equipment Recommendations  None recommended by OT    Recommendations for Other Services       Precautions / Restrictions Precautions Precautions: None      Mobility Bed Mobility                  Transfers Overall transfer level: Modified independent                    Balance                                            ADL Overall ADL's : Independent                                             Vision     Perception     Praxis      Pertinent Vitals/Pain Pain Assessment: No/denies pain     Hand Dominance Right   Extremity/Trunk Assessment Upper Extremity Assessment Upper Extremity Assessment: Overall WFL for tasks assessed   Lower Extremity Assessment Lower Extremity Assessment: Overall WFL for tasks assessed   Cervical / Trunk Assessment Cervical / Trunk Assessment: Normal   Communication Communication Communication: No difficulties   Cognition Arousal/Alertness: Awake/alert Behavior During Therapy: WFL for tasks assessed/performed Overall Cognitive Status: Within Functional Limits for tasks assessed                     General Comments       Exercises       Shoulder Instructions      Home Living Family/patient expects to be discharged to:: Private residence Living  Arrangements: Alone   Type of Home: House Home Access: Stairs to enter Technical brewer of Steps: 4 Entrance Stairs-Rails: None Home Layout: One level     Bathroom Shower/Tub: Teacher, early years/pre: Standard     Home Equipment: None          Prior Functioning/Environment Level of Independence: Independent        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 [employer]    OT Diagnosis:  (impaired activity tolerance)   OT Problem List:     OT Treatment/Interventions:      OT Goals(Current goals can be found in the care plan section)    OT Frequency:     Barriers to D/C:            Co-evaluation              End of Session    Activity Tolerance: Patient tolerated treatment well Patient left: in chair;with call bell/phone within reach   Time: 3149-7026 OT Time Calculation (min): 14 min Charges:  OT  General Charges $OT Visit: 1 Procedure OT Evaluation $Initial OT Evaluation Tier I: 1 Procedure G-Codes: OT G-codes **NOT FOR INPATIENT CLASS** Functional Assessment Tool Used: clinical judgement Functional Limitation: Self care Self Care Current Status (E1583): 0 percent impaired, limited or restricted Self Care Discharge Status (E9407): 0 percent impaired, limited or restricted  Malka So 09/23/2015, 10:03 AM  [phone]

## 2015-09-23 NOTE — Progress Notes (Signed)
Subjective: Oscar Brown is a 49yo M with PMH OSA, mitral regurgitation and chronic venous insuffiencey and lymphedema from DVT in 1993 who is admitted for shortness of breath. He had no acute events overnight. His breathing has improved significantly since admission, and he denies any shortness of breath and is breathing comfortably on room air, even with ambulation. The swelling in his legs are improved and at his baseline.  Objective: Vital signs in last 24 hours: Filed Vitals:   09/22/15 2036 09/23/15 0047 09/23/15 0605 09/23/15 1024  BP: 127/92 132/82 130/85 116/75  Pulse: 57 53 46 58  Temp: 98.2 F (36.8 C) 97.5 F (36.4 C) 97.6 F (36.4 C) 97 F (36.1 C)  TempSrc: Oral Axillary Oral Oral  Resp: 20 18 18 18   Height:      Weight:   209 lb 5.4 oz (94.956 kg)   SpO2: 100% 100% 100% 100%   Weight change: -15 lb 10.6 oz (-7.103 kg)  Intake/Output Summary (Last 24 hours) at 09/23/15 1151 Last data filed at 09/23/15 1056  Gross per 24 hour  Intake    823 ml  Output   2000 ml  Net  -1177 ml   BP 116/75 mmHg  Pulse 58  Temp(Src) 97 F (36.1 C) (Oral)  Resp 18  Ht 5\' 8"  (1.727 m)  Wt 209 lb 5.4 oz (94.956 kg)  BMI 31.84 kg/m2  SpO2 100%  General Appearance:    Alert, cooperative, no distress, appears stated age  Lungs:     Clear to auscultation bilaterally, respirations unlabored  Chest wall:    No tenderness or deformity  Heart:    Regular rate and rhythm, S1 and S2 normal, there is a grade 3/6 holosystolic murmur heard best at the apex that radiates to the axilla. No rubs or gallops heard.  Abdomen:     Soft, non-tender, bowel sounds active all four quadrants,    no masses, no organomegaly  Extremities:   Extremities normal, atraumatic, no cyanosis or edema  Pulses:   2+ and symmetric all extremities  Skin:   Skin color, texture, turgor normal, no rashes or lesions  Lymph nodes:   Cervical, supraclavicular, and axillary nodes normal  Neurologic:   CNII-XII intact.  Normal strength, sensation and reflexes      throughout   Lab Results: Basic Metabolic Panel:  Recent Labs Lab 09/21/15 1940 09/23/15 0959  NA 135 140  K 3.6 3.9  CL 105 108  CO2 23 28  GLUCOSE 127* 98  BUN 10 11  CREATININE 0.80 0.88  CALCIUM 8.6* 9.1  CBC:  Recent Labs Lab 09/21/15 1940  WBC 5.9  HGB 13.2  HCT 39.8  MCV 89.4  PLT 191   Cardiac Enzymes:  Recent Labs Lab 09/21/15 1940 09/22/15 1003  TROPONINI <0.03 <0.03   Studies/Results: Dg Chest 2 View  09/21/2015  CLINICAL DATA:  Cough and shortness of breath. EXAM: CHEST  2 VIEW COMPARISON:  CT scan abdomen scout image dated 06/13/2014 FINDINGS: There is borderline cardiomegaly. There is slight pulmonary vascular prominence with diffuse interstitial accentuation consistent with either pulmonary edema or diffuse interstitial pneumonitis. No discrete effusions. Slight thickening of the fissures. No osseous abnormality. IMPRESSION: Interstitial edema bilaterally, most likely pulmonary edema. Electronically Signed   By: Lorriane Shire M.D.   On: 09/21/2015 19:58   Assessment/Plan: Active Problems:   Shortness of breath   Dyspnea  1.Shortness of breath - Patient presents with progressive dyspnea, however not worsened by exertion. CXR  findings, LAE on EKG, history of MR and responsiveness to lasix all suggest underlying cause of his dyspnea may be from worsening of his MR, possibly worsened by non-compliance to his CPAP machine at home as the most likely causes on this broad differential. TTE showed normal EF with moderate-to-severe mitral regurgitation, no LV dilation and mild pulmonary hypertension (PAP 1mmHg). -Lasix 40mg  PO PRN -Consulted cardiology - will follow outpatient for further evaluation of mitral regurgitation for TEE and other workup as needed -Ambulate with O2 sats - if patient is able to ambulate without desaturating and getting significantly SOB, may be able to discharge today as patient is much  improved clinically  Dispo: Disposition is deferred at this time, awaiting improvement of current medical problems.  Anticipated discharge today.  The patient does have a current PCP Oscar Linsey, MD) and does need an [facility] hospital follow-up appointment after discharge.  The patient does not have transportation limitations that hinder transportation to clinic appointments.   LOS: 2 days   Norval Gable, MD 09/23/2015, 11:51 AM

## 2015-09-24 NOTE — Discharge Summary (Signed)
Name: Oscar Brown MRN: [id number] DOB: 18-Jun-1966 49 y.o. PCP: Oval Linsey, MD  Date of Admission: 09/21/2015  8:42 PM Date of Discharge: 09/24/2015 Attending Physician: No att. providers found  Discharge Diagnosis: 1. Shortness of breath 2/2 mitral regurgitation  Active Problems:   Shortness of breath   Dyspnea  Discharge Medications:   Medication List    TAKE these medications        furosemide 20 MG tablet  Commonly known as:  LASIX  Take 1 tablet (20 mg total) by mouth daily.     Influenza vac split quadrivalent PF 0.5 ML injection  Commonly known as:  FLUARIX  Inject 0.5 mLs into the muscle once.        Disposition and follow-up:   OscarLemoine CHAROD SLAWINSKI was discharged from [facility] in Good condition.  At the hospital follow up visit please address:  1.  Respiratory status, response to lasix  2.  Labs / imaging needed at time of follow-up: BMP  3.  Pending labs/ test needing follow-up: None  Follow-up Appointments:     Follow-up Information    Follow up with Osa Craver, MD On 09/30/2015.   Specialty:  Internal Medicine   Why:  10:45AM; for a hospital follow up appointment   Contact information:   [place] [address] [phone]       Follow up with Sharol Harness, MD On 10/09/2015.   Specialty:  Cardiology   Why:  @ 9:30am   Contact information:   [address] [place] [address] [phone]       Discharge Instructions: Discharge Instructions    Diet - low sodium heart healthy    Complete by:  As directed      Diet - low sodium heart healthy    Complete by:  As directed      Increase activity slowly    Complete by:  As directed      Increase activity slowly    Complete by:  As directed            Consultations:    Procedures Performed:  Dg Chest 2 View  09/21/2015  CLINICAL DATA:  Cough and shortness of breath. EXAM: CHEST  2 VIEW COMPARISON:  CT scan abdomen  scout image dated 06/13/2014 FINDINGS: There is borderline cardiomegaly. There is slight pulmonary vascular prominence with diffuse interstitial accentuation consistent with either pulmonary edema or diffuse interstitial pneumonitis. No discrete effusions. Slight thickening of the fissures. No osseous abnormality. IMPRESSION: Interstitial edema bilaterally, most likely pulmonary edema. Electronically Signed   By: Lorriane Shire M.D.   On: 09/21/2015 19:58    2D Echo: Mild diffuse calcification of the mitral valve anterior leaflet with moderate to severe mitral regurgitation. No LV dilation. EF 55-60%. Mild pulmonary hypertension (PAP 58mm Hg)  Cardiac Cath: None  Admission HPI: Oscar Brown is a 49 year old gentleman with a past medical history of mitral regurgitation, OSA, chronic venous insufficiency (secondary to DVT in 1993), and lymphedema who presents with shortness of breath. 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 Because of this, he is often restless, gets up, and forgets to put his CPAP back on. He requires 3 pillows at night. It is associated with a dry cough, and he reports coughing so much that his chest feels sore.  She also complains of a chest "tightness" when he lies down to sleep, but no chest pain. He denies any dyspnea on exertion, saying that he is able to work a full night at [employer] then work in his yard and others' yards for pay. He endorses that he occasionally feels a racing heart that seems out of rhythm - this is longstanding. He has had a significant intentional weight loss in the past year through diet changes from 280 lbs to 214 lbs. He has also had one episode in the last week of light-headedness upon standing. He has chronic swelling in his legs, but this is from longstanding venous insufficiency and lymphedema. Otherwise, he denies any fever,  headaches in the morning or otherwise, loss of consciousness, new one-sided weakness, sore throat, PND, wheezing, nausea, vomiting, diarrhea, or constipation. He takes no medications. He is a long-time smoker, but is currently trying to quite with nicotine patches. He says he still smokes and drink occasionally, denying any illicit drug use. He lives alone. Family history pertinent early death of his father from heart disease. In the ED, a CXR demonstrated pulmonary edema, normal i-Stat troponin, and BNP of 282. His shortness of breath significantly improved after a dose of lasix.  Hospital Course by problem list: Active Problems:   Shortness of breath   Dyspnea   1. Patient presented with progressive dyspnea, however not worsened by exertion. CXR findings, LAE on EKG, history of MR and responsiveness to lasix all suggested underlying cause of his dyspnea may be from worsening of his MR, possibly worsened by non-compliance to his CPAP machine at home as the most likely causes. He responded very well to lasix and his respiratory status was back to his baseline. TTE showed normal EF with moderate-to-severe mitral regurgitation, no LV dilation and mild pulmonary hypertension (PAP 39mmHg). Cardiology was consulted for further evaluation of his MR, who will follow outpatient for TEE and possible intervention.  Discharge Vitals:   BP 116/76 mmHg  Pulse 57  Temp(Src) 98.2 F (36.8 C) (Oral)  Resp 18  Ht 5\' 8"  (1.727 m)  Wt 209 lb 5.4 oz (94.956 kg)  BMI 31.84 kg/m2  SpO2 100%  Discharge Labs:  No results found for this or any previous visit (from the past 24 hour(s)).  Signed: Norval Gable, MD 09/24/2015, 11:23 AM

## 2015-09-30 ENCOUNTER — Ambulatory Visit (INDEPENDENT_AMBULATORY_CARE_PROVIDER_SITE_OTHER): Payer: Managed Care, Other (non HMO) | Admitting: Internal Medicine

## 2015-09-30 ENCOUNTER — Encounter: Payer: Self-pay | Admitting: Internal Medicine

## 2015-09-30 VITALS — BP 111/63 | HR 50 | Temp 98.3°F | Ht 68.0 in | Wt 211.3 lb

## 2015-09-30 DIAGNOSIS — I340 Nonrheumatic mitral (valve) insufficiency: Secondary | ICD-10-CM

## 2015-09-30 NOTE — Progress Notes (Signed)
PSO2 during walking 99 - 100% - room air. Hilda Blades Abdulraheem Pineo RN 09/30/15 11:25AM

## 2015-09-30 NOTE — Progress Notes (Signed)
Internal Medicine Clinic Attending  I saw and evaluated the patient.  I personally confirmed the key portions of the history and exam documented by Dr. Marvel Plan and I reviewed pertinent patient test results.  The assessment, diagnosis, and plan were formulated together and I agree with the documentation in the resident's note.  Hospital follow up from admission with new pulmonary edema likely coming from moderate-severe mitral regurgitation. We managed him medically with low dose diuretic which has resolved the edema, DOE, and PND. He is back to his baseline functional status. I am going to continue low dose lasix to prevent future fluid accumulation until at least he has a TEE to better evaluate the stage of MR. He has an appointment with cardiology next week and I anticipate he would be a good candidate for mitral clipping or replacement.

## 2015-09-30 NOTE — Assessment & Plan Note (Signed)
Patient was hospitalized from 10/23 to 10/26 for dyspnea secondary to severe mitral valve regurgitation. His symptoms improved with lasix and he was discharged on lasix 20 mg daily and instructed to be compliant with his CPAP and to follow up with both [facility] and Cardiology as he will likely need repair of his Mitral Valve. Plans are for a TEE and appointment with cardiology on 10/09/15 at 9:30 am. Patient denies any shortness of breath or DOE. He has been compliant with Lasix 20 mg daily and his CPAP. 100% on room air and on ambulation. He denies any lightheadedness or dizziness. Lungs are CTA b/l. Holosystolic murmur is unchanged from admission.   Plan: -Continue lasix 20 mg daily -Check BMET -Follow up with Cardiology on 10/09/15 at 9:30 am

## 2015-09-30 NOTE — Patient Instructions (Signed)
CONTINUE TAKING LASIX 20 MG DAILY.   FOLLOW UP WITH CARDIOLOGY ON 11/210 AT 9:30 AM.

## 2015-09-30 NOTE — Progress Notes (Signed)
Subjective:    Patient ID: Oscar Brown, male    DOB: Apr 13, 1966, 49 y.o.   MRN: [id number]  HPI Oscar Brown is a 49 y.o. male with PMHx of OSA, MR, and fatty liver who presents to the clinic for hospital follow up for severe mitral valve regurgitation leading to dyspnea. Please see A&P for the status of the patient's chronic medical problems.   Patient was hospitalized from 10/23 to 10/26 for dyspnea secondary to severe mitral valve regurgitation. His symptoms improved with lasix and he was discharged on lasix 20 mg daily and instructed to be compliant with his CPAP and to follow up with both [facility] and Cardiology as he will likely need repair of his Mitral Valve. Plans are for a TEE and appointment with cardiology on 10/09/15 at 9:30 am. Patient denies any shortness of breath or DOE. He has been compliant with Lasix 20 mg daily and his CPAP. 100% on room air and on ambulation. He denies any lightheadedness or dizziness. Lungs are CTA b/l. Holosystolic murmur is unchanged from admission.   Past Medical History  Diagnosis Date  . 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  Complicated by chronic left lower extremity edema and occasional recurrent left lower extremity cellulitis   . Obstructive sleep apnea 12/14/2006    AHI 19.2/hr, desaturation to 75% on Polysomnography 02/26/2005.  Uses 11 cm H2O nocturnal nasal CPAP with good symptom control.   . Obesity, Class II, BMI 35-39.9 12/14/2006  . Mitral regurgitation 10/06/2012    On examination   . Lymphedema   . Fatty liver     Outpatient Encounter Prescriptions as of 09/30/2015  Medication Sig  . furosemide (LASIX) 20 MG tablet Take 1 tablet (20 mg total) by mouth daily.  . Influenza vac split quadrivalent PF (FLUARIX) 0.5 ML injection Inject 0.5 mLs into the muscle once.   No facility-administered encounter medications on file as of 09/30/2015.    FHX: Parents HTN and CAD   Social History   Social  History  . Marital Status: Legally Separated    Spouse Name: N/A  . Number of Children: N/A  . Years of Education: N/A   Occupational History  . Not on file.   Social History Main Topics  . Smoking status: Former Smoker -- 0.10 packs/day    Types: Cigarettes    Quit date: 09/05/2012  . Smokeless tobacco: Never Used  . Alcohol Use: No  . Drug Use: No  . Sexual Activity: Not on file   Other Topics Concern  . Not on file   Social History Narrative    Review of Systems General: Denies fever, chills, fatigue Respiratory: Denies SOB, cough, DOE   Cardiovascular: Denies chest pain and palpitations.  Gastrointestinal: Denies nausea, vomiting, abdominal pain Neurological: Denies dizziness, headaches, weakness, lightheadedness    Objective:   Physical Exam Filed Vitals:   09/30/15 1122  BP: 111/63  Pulse: 50  Temp: 98.3 F (36.8 C)  TempSrc: Oral  Height: 5\' 8"  (1.727 m)  Weight: 211 lb 4.8 oz (95.845 kg)  SpO2: 100%   General: Vital signs reviewed.  Patient is well-developed and well-nourished, in no acute distress and cooperative with exam.  Cardiovascular: Bradycardic, regular rhythm, 2/6 holosystolic murmur loudest at the apex. Pulmonary/Chest: Clear to auscultation bilaterally, no wheezes, rales, or rhonchi. Abdominal: Soft, non-tender, non-distended, BS + Extremities: 2+ pitting edema bilaterally L>R,  pulses symmetric and intact bilaterally. Skin: Warm, dry and intact.  No rashes or erythema. Psychiatric: Normal mood and affect. speech and behavior is normal. Cognition and memory are normal.      Assessment & Plan:   Please see problem based assessment and plan.

## 2015-10-01 LAB — BMP8+ANION GAP
Anion Gap: 15 mmol/L (ref 10.0–18.0)
BUN / CREAT RATIO: 24 — AB (ref 9–20)
BUN: 21 mg/dL (ref 6–24)
CO2: 22 mmol/L (ref 18–29)
CREATININE: 0.89 mg/dL (ref 0.76–1.27)
Calcium: 9.5 mg/dL (ref 8.7–10.2)
Chloride: 99 mmol/L (ref 97–106)
GFR calc Af Amer: 116 mL/min/{1.73_m2} (ref >59)
GFR calc non Af Amer: 100 mL/min/{1.73_m2} (ref >59)
Glucose: 82 mg/dL (ref 65–99)
Potassium: 4.6 mmol/L (ref 3.5–5.2)
Sodium: 136 mmol/L (ref 136–144)

## 2015-10-09 ENCOUNTER — Ambulatory Visit (INDEPENDENT_AMBULATORY_CARE_PROVIDER_SITE_OTHER): Payer: Managed Care, Other (non HMO) | Admitting: Cardiovascular Disease

## 2015-10-09 ENCOUNTER — Encounter: Payer: Self-pay | Admitting: Cardiovascular Disease

## 2015-10-09 VITALS — BP 116/82 | HR 57 | Ht 68.0 in | Wt 217.9 lb

## 2015-10-09 DIAGNOSIS — I340 Nonrheumatic mitral (valve) insufficiency: Secondary | ICD-10-CM

## 2015-10-09 DIAGNOSIS — D689 Coagulation defect, unspecified: Secondary | ICD-10-CM | POA: Diagnosis not present

## 2015-10-09 DIAGNOSIS — Z01818 Encounter for other preprocedural examination: Secondary | ICD-10-CM | POA: Diagnosis not present

## 2015-10-09 LAB — BASIC METABOLIC PANEL
BUN: 20 mg/dL (ref 7–25)
CHLORIDE: 103 mmol/L (ref 98–110)
CO2: 27 mmol/L (ref 20–31)
CREATININE: 0.92 mg/dL (ref 0.60–1.35)
Calcium: 9.5 mg/dL (ref 8.6–10.3)
Glucose, Bld: 92 mg/dL (ref 65–99)
Potassium: 4.3 mmol/L (ref 3.5–5.3)
Sodium: 137 mmol/L (ref 135–146)

## 2015-10-09 LAB — CBC
HCT: 41.2 % (ref 39.0–52.0)
Hemoglobin: 14.2 g/dL (ref 13.0–17.0)
MCH: 30.4 pg (ref 26.0–34.0)
MCHC: 34.5 g/dL (ref 30.0–36.0)
MCV: 88.2 fL (ref 78.0–100.0)
MPV: 10.6 fL (ref 8.6–12.4)
PLATELETS: 179 10*3/uL (ref 150–400)
RBC: 4.67 MIL/uL (ref 4.22–5.81)
RDW: 13.2 % (ref 11.5–15.5)
WBC: 6.1 10*3/uL (ref 4.0–10.5)

## 2015-10-09 NOTE — Progress Notes (Signed)
Cardiology Office Note   Date:  10/09/2015   ID:  Oscar Brown, DOB [birth year], MRN [id number]  PCP:  Karren Cobble, MD  Cardiologist:   Sharol Harness, MD   Chief Complaint  Patient presents with  . New Evaluation    post hosp--pt states he had SOB and dry cough for a couple days, he could not catch his breath, Dr. said leaky heart valve--pt states he has had no SOB since then  . Dizziness    once every other week sometimes--works third shift--if he bends down sometimes  . Edema    bilateral legs, feet, ankles--wearing compression hose--lymphedema on left leg      History of Present Illness: Oscar Brown is a 49 y.o. male with mitral regurgitation, obstructive sleep apnea, tobacco abuse and obesity who presents for follow-up after hospitalization for pulmonary edema and moderate to severe mitral regurgitation.  He was hospitalized October 23 to the 26 dyspnea and volume overload. He was successfully diuresed with Lasix.  Echo his mitral regurgitation appeared to be moderate to severe, however the calculated out to be mild to moderate.  His left ventricle was nondilated and his ejection fraction was normal.  Oscar Brown has known that his mitral valve was leaking for 1-2 years.  For two weeks prior to being admitted to the ED he noted a dry cough, lightheadedness and dizziness.  He thought that it was due to over exerting himself at work.  She has been working the third shift at work and notices that his symptoms are worse later in his shift.  He denies any chest pain but has noted shortness of breath and orthopnea.  He has noted LE edema, but it mostly improved with elevating his legs.  Past Medical History  Diagnosis Date  . 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  Complicated by chronic left lower extremity edema and occasional recurrent left lower extremity cellulitis   . Obstructive sleep apnea 12/14/2006    AHI 19.2/hr,  desaturation to 75% on Polysomnography 02/26/2005.  Uses 11 cm H2O nocturnal nasal CPAP with good symptom control.   . Obesity, Class II, BMI 35-39.9 12/14/2006  . Mitral regurgitation 10/06/2012    On examination   . Lymphedema   . Fatty liver     No past surgical history on file.   Current Outpatient Prescriptions  Medication Sig Dispense Refill  . furosemide (LASIX) 20 MG tablet Take 1 tablet (20 mg total) by mouth daily. 30 tablet 3  . Influenza vac split quadrivalent PF (FLUARIX) 0.5 ML injection Inject 0.5 mLs into the muscle once. 0.5 mL 0   No current facility-administered medications for this visit.    Allergies:   Review of patient's allergies indicates no known allergies.    Social History:  The patient  reports that he quit smoking about 3 years ago. His smoking use included Cigarettes. He smoked 0.10 packs per day. He has never used smokeless tobacco. He reports that he does not drink alcohol or use illicit drugs.   Family History:  The patient's  family history is not on file.    ROS:  Please see the history of present illness.   Otherwise, review of systems are positive for none.   All other systems are reviewed and negative.    PHYSICAL EXAM: VS:  BP 116/82 mmHg  Pulse 57  Ht 5\' 8"  (1.727 m)  Wt 98.839 kg (217 lb 14.4  oz)  BMI 33.14 kg/m2 , BMI Body mass index is 33.14 kg/(m^2). GENERAL:  Well appearing HEENT:  Pupils equal round and reactive, fundi not visualized, oral mucosa unremarkable NECK:  No jugular venous distention, waveform within normal limits, carotid upstroke brisk and symmetric, no bruits, no thyromegaly LYMPHATICS:  No cervical adenopathy LUNGS:  Clear to auscultation bilaterally HEART:  RRR.  PMI not displaced or sustained,S1 and S2 within normal limits, no S3, no S4, no clicks, no rubs, II/VI early-peaking systolic murmur at the RUSB, III/VI holosystolic murmur at the apex. ABD:  Flat, positive bowel sounds normal in frequency in pitch, no  bruits, no rebound, no guarding, no midline pulsatile mass, no hepatomegaly, no splenomegaly EXT:  2 plus pulses throughout, no edema, no cyanosis no clubbing.  Wearing TED hose. SKIN:  No rashes no nodules NEURO:  Cranial nerves II through XII grossly intact, motor grossly intact throughout PSYCH:  Cognitively intact, oriented to person place and time    EKG:  EKG is ordered today. The ekg ordered today demonstrates sinus bradycardia rate 57 bpm.    Echo 09/22/15: Study Conclusions  - Left ventricle: The cavity size was normal. Systolic function was normal. The estimated ejection fraction was in the range of 55% to 60%. Wall motion was normal; there were no regional wall motion abnormalities. - Aortic valve: Moderate diffuse thickening and calcification, consistent with sclerosis. - Mitral valve: Mild diffuse calcification of the anterior leaflet. Visually there appears to be moderate to severe regurgitation directed eccentrically and posteriorly on the apical 4 chamber and 2 chamber views. The MR is calculated as mild to mdoerate using the ERO and MR volume. Consider TEE to assess degree of MR further. - Left atrium: The atrium was severely dilated. - Right ventricle: The cavity size was mildly dilated. Wall thickness was normal. - Pulmonary arteries: PA peak pressure: 35 mm Hg (S).  Impressions:  - The right ventricular systolic pressure was increased consistent with mild pulmonary hypertension.   Recent Labs: 05/28/2015: ALT 11* 09/21/2015: B Natriuretic Peptide 282.0*; Hemoglobin 13.2; Platelets 191 09/30/2015: BUN 21; Creatinine, Ser 0.89; Potassium 4.6; Sodium 136    Lipid Panel    Component Value Date/Time   CHOL 187 09/22/2015 0200   TRIG 54 09/22/2015 0200   HDL 60 09/22/2015 0200   CHOLHDL 3.1 09/22/2015 0200   VLDL 11 09/22/2015 0200   LDLCALC 116* 09/22/2015 0200      Wt Readings from Last 3 Encounters:  10/09/15 98.839 kg (217  lb 14.4 oz)  09/30/15 95.845 kg (211 lb 4.8 oz)  09/23/15 94.956 kg (209 lb 5.4 oz)      ASSESSMENT AND PLAN:  # Mitral regurgitation: Oscar Brown transthoracic echo revealed mild to moderate mitral regurgitation by ERO, but visually appeared more severe and the jet was eccentrically located.  His EF was only 55-60%, though his left ventricle was not dilated. We will obtain a transesophageal echo to further evaluate the severity of his mitral valve regurgitation. It is likely that he may need mitral valve repair/replacement due to symptomatic severe mitral regurgitation. He appears euvolemic on exam today. Continue furosemide 20 mg daily. We'll check a basic metabolic panel to evaluate his renal function and electrolytes on this therapy.  # Tobacco abuse: Oscar Brown quit smoking during his hospitalization. He was congratulated on these efforts. He continues to use a patch for smoking cessation 7 days, but does not need to use it every day.  # CV Disease Prevention:  Lipids  at goal.  Current medicines are reviewed at length with the patient today.  The patient does not have concerns regarding medicines.  The following changes have been made:  no change  Labs/ tests ordered today include:  No orders of the defined types were placed in this encounter.     Disposition:   FU with Oscar Kissling C. Oval Linsey, MD in 3 months.    Signed, Sharol Harness, MD  10/09/2015 10:20 AM    [place]

## 2015-10-09 NOTE — Patient Instructions (Addendum)
Your physician recommends that you schedule a follow-up appointment in 3 month Dr Hubbard Hartshorn  LABS- CBC,BMP,PT,PTT  SCHEDULE AT [facility] physician has requested that you have an echocardiogram. Echocardiography is a painless test that uses sound waves to create images of your heart. It provides your doctor with information about the size and shape of your heart and how well your heart's chambers and valves are working. This procedure takes approximately one hour. There are no restrictions for this procedure.    Transesophageal Echocardiogram Transesophageal echocardiography (TEE) is a special type of test that produces images of the heart by using sound waves (echocardiogram). This type of echocardiography can obtain better images of the heart than standard echocardiography. TEE is done by passing a flexible tube down the esophagus. The heart is located in front of the esophagus. Because the heart and esophagus are close to one another, your health care provider can take very clear, detailed pictures of the heart via ultrasound waves. TEE may be done:  If your health care provider needs more information based on standard echocardiography findings.  If you had a stroke. This might have happened because a clot formed in your heart. TEE can visualize different areas of the heart and check for clots.  To check valve anatomy and function.  To check for infection on the inside of your heart (endocarditis).  To evaluate the dividing wall (septum) of the heart and presence of a hole that did not close after birth (patent foramen ovale or atrial septal defect).  To help diagnose a tear in the wall of the aorta (aortic dissection).  During cardiac valve surgery. This allows the surgeon to assess the valve repair before closing the chest.  During a variety of other cardiac procedures to guide positioning of catheters.  Sometimes before a cardioversion, which is a shock to convert heart  rhythm back to normal. LET [facility] CARE PROVIDER KNOW ABOUT:   Any allergies you have.  All medicines you are taking, including vitamins, herbs, eye drops, creams, and over-the-counter medicines.  Previous problems you or members of your family have had with the use of anesthetics.  Any blood disorders you have.  Previous surgeries you have had.  Medical conditions you have.  Swallowing difficulties.  An esophageal obstruction. RISKS AND COMPLICATIONS  Generally, TEE is a safe procedure. However, as with any procedure, complications can occur. Possible complications include an esophageal tear (rupture). BEFORE THE PROCEDURE   Do not eat or drink for 6 hours before the procedure or as directed by your health care provider.  Arrange for someone to drive you home after the procedure. Do not drive yourself home. During the procedure, you will be given medicines that can continue to make you feel drowsy and can impair your reflexes.  An IV access tube will be started in the arm. PROCEDURE   A medicine to help you relax (sedative) will be given through the IV access tube.  A medicine may be sprayed or gargled to numb the back of the throat.  Your blood pressure, heart rate, and breathing (vital signs) will be monitored during the procedure.  The TEE probe is a long, flexible tube. The tip of the probe is placed into the back of the mouth, and you will be asked to swallow. This helps to pass the tip of the probe into the esophagus. Once the tip of the probe is in the correct area, your health care provider can take pictures of the heart.  TEE is usually not a painful procedure. You may feel the probe press against the back of the throat. The probe does not enter the trachea and does not affect your breathing. AFTER THE PROCEDURE   You will be in bed, resting, until you have fully returned to consciousness.  When you first awaken, your throat may feel slightly sore and will  probably still feel numb. This will improve slowly over time.  You will not be allowed to eat or drink until it is clear that the numbness has improved.  Once you have been able to drink, urinate, and sit on the edge of the bed without feeling sick to your stomach (nausea) or dizzy, you may be cleared to go home.  You should have a friend or family member with you for the next 24 hours after your procedure.   This information is not intended to replace advice given to you by your health care provider. Make sure you discuss any questions you have with your health care provider.   Document Released: 02/05/2003 Document Revised: 11/20/2013 Document Reviewed: 05/17/2013 Elsevier Interactive Patient Education [employer].

## 2015-10-10 LAB — PROTIME-INR
INR: 0.95 (ref <1.50)
Prothrombin Time: 12.8 seconds (ref 11.6–15.2)

## 2015-10-10 LAB — APTT: aPTT: 28 seconds (ref 24–37)

## 2015-10-12 ENCOUNTER — Encounter: Payer: Self-pay | Admitting: Cardiovascular Disease

## 2015-10-13 ENCOUNTER — Other Ambulatory Visit: Payer: Self-pay | Admitting: *Deleted

## 2015-10-13 DIAGNOSIS — Z01818 Encounter for other preprocedural examination: Secondary | ICD-10-CM

## 2015-10-14 ENCOUNTER — Telehealth: Payer: Self-pay | Admitting: *Deleted

## 2015-10-14 NOTE — Telephone Encounter (Signed)
Spoke to patient. Result given . Verbalized understanding  

## 2015-10-14 NOTE — Telephone Encounter (Signed)
-----   Message from Skeet Latch, MD sent at 10/14/2015  8:02 AM EST ----- Constance Holster kidney function, blood counts and clotting factors.  Continue current medications.

## 2015-10-16 ENCOUNTER — Encounter (HOSPITAL_COMMUNITY): Payer: Self-pay

## 2015-10-16 ENCOUNTER — Encounter (HOSPITAL_COMMUNITY)
Admission: RE | Disposition: A | Discharge status: Home / Self Care | Payer: Self-pay | Source: Ambulatory Visit | Attending: Cardiovascular Disease

## 2015-10-16 ENCOUNTER — Ambulatory Visit (HOSPITAL_BASED_OUTPATIENT_CLINIC_OR_DEPARTMENT_OTHER): Payer: Managed Care, Other (non HMO)

## 2015-10-16 ENCOUNTER — Ambulatory Visit (HOSPITAL_COMMUNITY)
Admission: RE | Admit: 2015-10-16 | Discharge: 2015-10-16 | Disposition: A | Discharge status: Home / Self Care | Payer: Managed Care, Other (non HMO) | Source: Ambulatory Visit | Attending: Cardiovascular Disease | Admitting: Cardiovascular Disease

## 2015-10-16 DIAGNOSIS — I340 Nonrheumatic mitral (valve) insufficiency: Secondary | ICD-10-CM | POA: Diagnosis not present

## 2015-10-16 DIAGNOSIS — E669 Obesity, unspecified: Secondary | ICD-10-CM | POA: Diagnosis not present

## 2015-10-16 DIAGNOSIS — Z23 Encounter for immunization: Secondary | ICD-10-CM | POA: Diagnosis not present

## 2015-10-16 DIAGNOSIS — G4733 Obstructive sleep apnea (adult) (pediatric): Secondary | ICD-10-CM | POA: Diagnosis not present

## 2015-10-16 DIAGNOSIS — Z01818 Encounter for other preprocedural examination: Secondary | ICD-10-CM

## 2015-10-16 DIAGNOSIS — F17200 Nicotine dependence, unspecified, uncomplicated: Secondary | ICD-10-CM | POA: Insufficient documentation

## 2015-10-16 DIAGNOSIS — Z79899 Other long term (current) drug therapy: Secondary | ICD-10-CM | POA: Diagnosis not present

## 2015-10-16 DIAGNOSIS — Z87891 Personal history of nicotine dependence: Secondary | ICD-10-CM | POA: Insufficient documentation

## 2015-10-16 HISTORY — PX: TEE WITHOUT CARDIOVERSION: SHX5443

## 2015-10-16 SURGERY — ECHOCARDIOGRAM, TRANSESOPHAGEAL
Anesthesia: Moderate Sedation

## 2015-10-16 MED ORDER — MIDAZOLAM HCL 10 MG/2ML IJ SOLN
INTRAMUSCULAR | Status: DC | PRN
  Administered 2015-10-16 (×2): 2 mg via INTRAVENOUS
  Administered 2015-10-16: 1 mg via INTRAVENOUS

## 2015-10-16 MED ORDER — SODIUM CHLORIDE 0.9 % IV SOLN
INTRAVENOUS | Status: DC
  Administered 2015-10-16: 500 mL via INTRAVENOUS

## 2015-10-16 MED ORDER — FENTANYL CITRATE (PF) 100 MCG/2ML IJ SOLN
INTRAMUSCULAR | Status: AC
  Filled 2015-10-16: qty 2

## 2015-10-16 MED ORDER — MIDAZOLAM HCL 5 MG/ML IJ SOLN
INTRAMUSCULAR | Status: AC
  Filled 2015-10-16: qty 2

## 2015-10-16 MED ORDER — BUTAMBEN-TETRACAINE-BENZOCAINE 2-2-14 % EX AERO
INHALATION_SPRAY | CUTANEOUS | Status: DC | PRN
  Administered 2015-10-16: 2 via TOPICAL

## 2015-10-16 MED ORDER — FENTANYL CITRATE (PF) 100 MCG/2ML IJ SOLN
INTRAMUSCULAR | Status: DC | PRN
  Administered 2015-10-16 (×2): 25 ug via INTRAVENOUS

## 2015-10-16 NOTE — CV Procedure (Signed)
Brief TEE Report  LVEF >55% No LA or LAA appendage Severe mitral regurgitation.  Restricted posterior leaflet. Trivial AR, TR.  For further details see full report.  Oscar Brown C. Oscar Linsey, MD 10/16/2015 8:39 AM

## 2015-10-16 NOTE — Progress Notes (Signed)
  Echocardiogram Echocardiogram Transesophageal has been performed.  Darlina Sicilian M 10/16/2015, 9:03 AM

## 2015-10-16 NOTE — H&P (View-Only) (Signed)
Cardiology Office Note   Date:  10/09/2015   ID:  Oscar Brown, DOB [birth year], MRN [id number]  PCP:  Karren Cobble, MD  Cardiologist:   Sharol Harness, MD   Chief Complaint  Patient presents with  . New Evaluation    post hosp--pt states he had SOB and dry cough for a couple days, he could not catch his breath, Dr. said leaky heart valve--pt states he has had no SOB since then  . Dizziness    once every other week sometimes--works third shift--if he bends down sometimes  . Edema    bilateral legs, feet, ankles--wearing compression hose--lymphedema on left leg      History of Present Illness: Oscar Brown is a 49 y.o. male with mitral regurgitation, obstructive sleep apnea, tobacco abuse and obesity who presents for follow-up after hospitalization for pulmonary edema and moderate to severe mitral regurgitation.  He was hospitalized October 23 to the 26 dyspnea and volume overload. He was successfully diuresed with Lasix.  Echo his mitral regurgitation appeared to be moderate to severe, however the calculated out to be mild to moderate.  His left ventricle was nondilated and his ejection fraction was normal.  Oscar Brown has known that his mitral valve was leaking for 1-2 years.  For two weeks prior to being admitted to the ED he noted a dry cough, lightheadedness and dizziness.  He thought that it was due to over exerting himself at work.  She has been working the third shift at work and notices that his symptoms are worse later in his shift.  He denies any chest pain but has noted shortness of breath and orthopnea.  He has noted LE edema, but it mostly improved with elevating his legs.  Past Medical History  Diagnosis Date  . 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  Complicated by chronic left lower extremity edema and occasional recurrent left lower extremity cellulitis   . Obstructive sleep apnea 12/14/2006    AHI 19.2/hr,  desaturation to 75% on Polysomnography 02/26/2005.  Uses 11 cm H2O nocturnal nasal CPAP with good symptom control.   . Obesity, Class II, BMI 35-39.9 12/14/2006  . Mitral regurgitation 10/06/2012    On examination   . Lymphedema   . Fatty liver     No past surgical history on file.   Current Outpatient Prescriptions  Medication Sig Dispense Refill  . furosemide (LASIX) 20 MG tablet Take 1 tablet (20 mg total) by mouth daily. 30 tablet 3  . Influenza vac split quadrivalent PF (FLUARIX) 0.5 ML injection Inject 0.5 mLs into the muscle once. 0.5 mL 0   No current facility-administered medications for this visit.    Allergies:   Review of patient's allergies indicates no known allergies.    Social History:  The patient  reports that he quit smoking about 3 years ago. His smoking use included Cigarettes. He smoked 0.10 packs per day. He has never used smokeless tobacco. He reports that he does not drink alcohol or use illicit drugs.   Family History:  The patient's  family history is not on file.    ROS:  Please see the history of present illness.   Otherwise, review of systems are positive for none.   All other systems are reviewed and negative.    PHYSICAL EXAM: VS:  BP 116/82 mmHg  Pulse 57  Ht 5\' 8"  (1.727 m)  Wt 98.839 kg (217 lb 14.4  oz)  BMI 33.14 kg/m2 , BMI Body mass index is 33.14 kg/(m^2). GENERAL:  Well appearing HEENT:  Pupils equal round and reactive, fundi not visualized, oral mucosa unremarkable NECK:  No jugular venous distention, waveform within normal limits, carotid upstroke brisk and symmetric, no bruits, no thyromegaly LYMPHATICS:  No cervical adenopathy LUNGS:  Clear to auscultation bilaterally HEART:  RRR.  PMI not displaced or sustained,S1 and S2 within normal limits, no S3, no S4, no clicks, no rubs, II/VI early-peaking systolic murmur at the RUSB, III/VI holosystolic murmur at the apex. ABD:  Flat, positive bowel sounds normal in frequency in pitch, no  bruits, no rebound, no guarding, no midline pulsatile mass, no hepatomegaly, no splenomegaly EXT:  2 plus pulses throughout, no edema, no cyanosis no clubbing.  Wearing TED hose. SKIN:  No rashes no nodules NEURO:  Cranial nerves II through XII grossly intact, motor grossly intact throughout PSYCH:  Cognitively intact, oriented to person place and time    EKG:  EKG is ordered today. The ekg ordered today demonstrates sinus bradycardia rate 57 bpm.    Echo 09/22/15: Study Conclusions  - Left ventricle: The cavity size was normal. Systolic function was normal. The estimated ejection fraction was in the range of 55% to 60%. Wall motion was normal; there were no regional wall motion abnormalities. - Aortic valve: Moderate diffuse thickening and calcification, consistent with sclerosis. - Mitral valve: Mild diffuse calcification of the anterior leaflet. Visually there appears to be moderate to severe regurgitation directed eccentrically and posteriorly on the apical 4 chamber and 2 chamber views. The MR is calculated as mild to mdoerate using the ERO and MR volume. Consider TEE to assess degree of MR further. - Left atrium: The atrium was severely dilated. - Right ventricle: The cavity size was mildly dilated. Wall thickness was normal. - Pulmonary arteries: PA peak pressure: 35 mm Hg (S).  Impressions:  - The right ventricular systolic pressure was increased consistent with mild pulmonary hypertension.   Recent Labs: 05/28/2015: ALT 11* 09/21/2015: B Natriuretic Peptide 282.0*; Hemoglobin 13.2; Platelets 191 09/30/2015: BUN 21; Creatinine, Ser 0.89; Potassium 4.6; Sodium 136    Lipid Panel    Component Value Date/Time   CHOL 187 09/22/2015 0200   TRIG 54 09/22/2015 0200   HDL 60 09/22/2015 0200   CHOLHDL 3.1 09/22/2015 0200   VLDL 11 09/22/2015 0200   LDLCALC 116* 09/22/2015 0200      Wt Readings from Last 3 Encounters:  10/09/15 98.839 kg (217  lb 14.4 oz)  09/30/15 95.845 kg (211 lb 4.8 oz)  09/23/15 94.956 kg (209 lb 5.4 oz)      ASSESSMENT AND PLAN:  # Mitral regurgitation: Oscar Brown transthoracic echo revealed mild to moderate mitral regurgitation by ERO, but visually appeared more severe and the jet was eccentrically located.  His EF was only 55-60%, though his left ventricle was not dilated. We will obtain a transesophageal echo to further evaluate the severity of his mitral valve regurgitation. It is likely that he may need mitral valve repair/replacement due to symptomatic severe mitral regurgitation. He appears euvolemic on exam today. Continue furosemide 20 mg daily. We'll check a basic metabolic panel to evaluate his renal function and electrolytes on this therapy.  # Tobacco abuse: Mr. Corro quit smoking during his hospitalization. He was congratulated on these efforts. He continues to use a patch for smoking cessation 7 days, but does not need to use it every day.  # CV Disease Prevention:  Lipids  at goal.  Current medicines are reviewed at length with the patient today.  The patient does not have concerns regarding medicines.  The following changes have been made:  no change  Labs/ tests ordered today include:  No orders of the defined types were placed in this encounter.     Disposition:   FU with Bora Broner C. Oval Linsey, MD in 3 months.    Signed, Sharol Harness, MD  10/09/2015 10:20 AM    [place]

## 2015-10-16 NOTE — Interval H&P Note (Signed)
History and Physical Interval Note:  10/16/2015 7:35 AM  Oscar Brown  has presented today for surgery, with the diagnosis of mitral regurgitation  The various methods of treatment have been discussed with the patient and family. After consideration of risks, benefits and other options for treatment, the patient has consented to  Procedure(s): TRANSESOPHAGEAL ECHOCARDIOGRAM (TEE) (N/A) as a surgical intervention .  The patient's history has been reviewed, patient examined, no change in status, stable for surgery.  I have reviewed the patient's chart and labs.  Questions were answered to the patient's satisfaction.     Sharol Harness, MD 10/16/2015 7:35 AM

## 2015-10-16 NOTE — Discharge Instructions (Signed)

## 2015-10-20 ENCOUNTER — Encounter (HOSPITAL_COMMUNITY): Payer: Self-pay | Admitting: Cardiovascular Disease

## 2015-10-21 ENCOUNTER — Telehealth: Payer: Self-pay | Admitting: *Deleted

## 2015-10-21 DIAGNOSIS — D689 Coagulation defect, unspecified: Secondary | ICD-10-CM

## 2015-10-21 DIAGNOSIS — I340 Nonrheumatic mitral (valve) insufficiency: Secondary | ICD-10-CM

## 2015-10-21 DIAGNOSIS — I38 Endocarditis, valve unspecified: Secondary | ICD-10-CM

## 2015-10-21 DIAGNOSIS — Z01810 Encounter for preprocedural cardiovascular examination: Secondary | ICD-10-CM

## 2015-10-21 DIAGNOSIS — R931 Abnormal findings on diagnostic imaging of heart and coronary circulation: Secondary | ICD-10-CM

## 2015-10-21 NOTE — Telephone Encounter (Signed)
-----   Message from Skeet Latch, MD sent at 10/17/2015  7:01 AM EST ----- Please schedule Mr. Miazga for an appointment with Dr. Roxy Manns to discuss mitral valve replacement.  Prior to that appointment he will need left and right heart catheterization with Dr. Ellyn Hack.

## 2015-10-21 NOTE — Telephone Encounter (Signed)
Spoke to patient. RN asked patient was he aware of upcomming procedure that was needed.  patient states he was aware but information was given to his girlfriend  RN answered patient question. Order placed for left  and right  Heart cath Order placed for referral to Dr Ricard Dillon

## 2015-10-22 ENCOUNTER — Encounter: Payer: Self-pay | Admitting: Cardiology

## 2015-10-28 LAB — CBC
HCT: 42.4 % (ref 39.0–52.0)
Hemoglobin: 14.7 g/dL (ref 13.0–17.0)
MCH: 30.1 pg (ref 26.0–34.0)
MCHC: 34.7 g/dL (ref 30.0–36.0)
MCV: 86.7 fL (ref 78.0–100.0)
MPV: 10.9 fL (ref 8.6–12.4)
PLATELETS: 188 10*3/uL (ref 150–400)
RBC: 4.89 MIL/uL (ref 4.22–5.81)
RDW: 13 % (ref 11.5–15.5)
WBC: 5.6 10*3/uL (ref 4.0–10.5)

## 2015-10-28 LAB — BASIC METABOLIC PANEL
BUN: 17 mg/dL (ref 7–25)
CALCIUM: 9.6 mg/dL (ref 8.6–10.3)
CO2: 29 mmol/L (ref 20–31)
CREATININE: 0.95 mg/dL (ref 0.60–1.35)
Chloride: 102 mmol/L (ref 98–110)
GLUCOSE: 80 mg/dL (ref 65–99)
Potassium: 4.4 mmol/L (ref 3.5–5.3)
Sodium: 135 mmol/L (ref 135–146)

## 2015-10-28 LAB — APTT: aPTT: 29 seconds (ref 24–37)

## 2015-10-28 LAB — PROTIME-INR
INR: 1.02 (ref <1.50)
Prothrombin Time: 13.5 seconds (ref 11.6–15.2)

## 2015-10-30 ENCOUNTER — Telehealth: Payer: Self-pay | Admitting: Cardiovascular Disease

## 2015-10-30 ENCOUNTER — Encounter (HOSPITAL_COMMUNITY): Payer: Self-pay | Admitting: Pharmacy Technician

## 2015-10-30 NOTE — Telephone Encounter (Signed)
Spoke to patient. Lab  Result given . Verbalized understanding Cath is still a go

## 2015-10-30 NOTE — Telephone Encounter (Signed)
Mr. Ricketts returned a phone call , please call

## 2015-11-03 ENCOUNTER — Encounter (HOSPITAL_COMMUNITY): Payer: Self-pay | Admitting: Cardiology

## 2015-11-03 ENCOUNTER — Encounter (HOSPITAL_COMMUNITY)
Admission: RE | Disposition: A | Discharge status: Home / Self Care | Payer: Self-pay | Source: Ambulatory Visit | Attending: Cardiology

## 2015-11-03 ENCOUNTER — Ambulatory Visit (HOSPITAL_COMMUNITY)
Admission: RE | Admit: 2015-11-03 | Discharge: 2015-11-03 | Disposition: A | Discharge status: Home / Self Care | Payer: Managed Care, Other (non HMO) | Source: Ambulatory Visit | Attending: Cardiology | Admitting: Cardiology

## 2015-11-03 DIAGNOSIS — Z79899 Other long term (current) drug therapy: Secondary | ICD-10-CM | POA: Insufficient documentation

## 2015-11-03 DIAGNOSIS — Z86718 Personal history of other venous thrombosis and embolism: Secondary | ICD-10-CM | POA: Insufficient documentation

## 2015-11-03 DIAGNOSIS — Z6833 Body mass index (BMI) 33.0-33.9, adult: Secondary | ICD-10-CM | POA: Diagnosis not present

## 2015-11-03 DIAGNOSIS — E669 Obesity, unspecified: Secondary | ICD-10-CM | POA: Diagnosis not present

## 2015-11-03 DIAGNOSIS — I2510 Atherosclerotic heart disease of native coronary artery without angina pectoris: Secondary | ICD-10-CM

## 2015-11-03 DIAGNOSIS — Z87891 Personal history of nicotine dependence: Secondary | ICD-10-CM | POA: Diagnosis not present

## 2015-11-03 DIAGNOSIS — I340 Nonrheumatic mitral (valve) insufficiency: Secondary | ICD-10-CM | POA: Diagnosis not present

## 2015-11-03 DIAGNOSIS — G4733 Obstructive sleep apnea (adult) (pediatric): Secondary | ICD-10-CM | POA: Insufficient documentation

## 2015-11-03 HISTORY — PX: CARDIAC CATHETERIZATION: SHX172

## 2015-11-03 LAB — POCT I-STAT 3, ART BLOOD GAS (G3+)
Acid-base deficit: 1 mmol/L (ref 0.0–2.0)
BICARBONATE: 24.8 meq/L — AB (ref 20.0–24.0)
BICARBONATE: 25.1 meq/L — AB (ref 20.0–24.0)
O2 SAT: 92 %
O2 SAT: 96 %
PCO2 ART: 43.2 mmHg (ref 35.0–45.0)
PCO2 ART: 41.8 mmHg (ref 35.0–45.0)
PO2 ART: 67 mmHg — AB (ref 80.0–100.0)
PO2 ART: 86 mmHg (ref 80.0–100.0)
TCO2: 26 mmol/L (ref 0–100)
TCO2: 26 mmol/L (ref 0–100)
pH, Arterial: 7.367 (ref 7.350–7.450)
pH, Arterial: 7.386 (ref 7.350–7.450)

## 2015-11-03 LAB — POCT I-STAT 3, VENOUS BLOOD GAS (G3P V)
ACID-BASE DEFICIT: 1 mmol/L (ref 0.0–2.0)
Acid-base deficit: 1 mmol/L (ref 0.0–2.0)
BICARBONATE: 24.7 meq/L — AB (ref 20.0–24.0)
BICARBONATE: 24.9 meq/L — AB (ref 20.0–24.0)
O2 Saturation: 66 %
O2 Saturation: 70 %
PH VEN: 7.354 — AB (ref 7.250–7.300)
PH VEN: 7.365 — AB (ref 7.250–7.300)
PO2 VEN: 36 mmHg (ref 30.0–45.0)
PO2 VEN: 39 mmHg (ref 30.0–45.0)
TCO2: 26 mmol/L (ref 0–100)
TCO2: 26 mmol/L (ref 0–100)
pCO2, Ven: 43.2 mmHg — ABNORMAL LOW (ref 45.0–50.0)
pCO2, Ven: 44.7 mmHg — ABNORMAL LOW (ref 45.0–50.0)

## 2015-11-03 SURGERY — RIGHT/LEFT HEART CATH AND CORONARY ANGIOGRAPHY

## 2015-11-03 MED ORDER — SODIUM CHLORIDE 0.9 % WEIGHT BASED INFUSION: 3.0000 mL/kg/h | INTRAVENOUS | Status: AC

## 2015-11-03 MED ORDER — LIDOCAINE HCL (PF) 1 % IJ SOLN
INTRAMUSCULAR | Status: DC | PRN
  Administered 2015-11-03: 09:00:00

## 2015-11-03 MED ORDER — FENTANYL CITRATE (PF) 100 MCG/2ML IJ SOLN
INTRAMUSCULAR | Status: DC | PRN
  Administered 2015-11-03: 50 ug via INTRAVENOUS

## 2015-11-03 MED ORDER — ONDANSETRON HCL 4 MG/2ML IJ SOLN: 4.0000 mg | Freq: Four times a day (QID) | INTRAMUSCULAR | Status: DC | PRN

## 2015-11-03 MED ORDER — ASPIRIN 81 MG PO CHEW
81.0000 mg | CHEWABLE_TABLET | ORAL | Status: AC
  Administered 2015-11-03: 81 mg via ORAL

## 2015-11-03 MED ORDER — SODIUM CHLORIDE 0.9 % IV SOLN
INTRAVENOUS | Status: DC
  Administered 2015-11-03: 06:00:00 via INTRAVENOUS

## 2015-11-03 MED ORDER — SODIUM CHLORIDE 0.9 % IV SOLN: 250.0000 mL | INTRAVENOUS | Status: DC | PRN

## 2015-11-03 MED ORDER — HEPARIN SODIUM (PORCINE) 1000 UNIT/ML IJ SOLN
INTRAMUSCULAR | Status: AC
  Filled 2015-11-03: qty 1

## 2015-11-03 MED ORDER — MIDAZOLAM HCL 2 MG/2ML IJ SOLN
INTRAMUSCULAR | Status: AC
  Filled 2015-11-03: qty 2

## 2015-11-03 MED ORDER — NITROGLYCERIN 1 MG/10 ML FOR IR/CATH LAB
INTRA_ARTERIAL | Status: AC
  Filled 2015-11-03: qty 10

## 2015-11-03 MED ORDER — MIDAZOLAM HCL 2 MG/2ML IJ SOLN
INTRAMUSCULAR | Status: DC | PRN
  Administered 2015-11-03: 2 mg via INTRAVENOUS

## 2015-11-03 MED ORDER — FENTANYL CITRATE (PF) 100 MCG/2ML IJ SOLN
INTRAMUSCULAR | Status: AC
  Filled 2015-11-03: qty 2

## 2015-11-03 MED ORDER — IOHEXOL 350 MG/ML SOLN
INTRAVENOUS | Status: DC | PRN
  Administered 2015-11-03: 75 mL via INTRACARDIAC

## 2015-11-03 MED ORDER — VERAPAMIL HCL 2.5 MG/ML IV SOLN
INTRA_ARTERIAL | Status: DC | PRN
  Administered 2015-11-03: 10 mL via INTRA_ARTERIAL

## 2015-11-03 MED ORDER — HEPARIN SODIUM (PORCINE) 1000 UNIT/ML IJ SOLN
INTRAMUSCULAR | Status: DC | PRN
  Administered 2015-11-03: 4000 [IU] via INTRAVENOUS

## 2015-11-03 MED ORDER — SODIUM CHLORIDE 0.9 % IJ SOLN: 3.0000 mL | Freq: Two times a day (BID) | INTRAMUSCULAR | Status: DC

## 2015-11-03 MED ORDER — HEPARIN (PORCINE) IN NACL 2-0.9 UNIT/ML-% IJ SOLN
INTRAMUSCULAR | Status: AC
  Filled 2015-11-03: qty 1000

## 2015-11-03 MED ORDER — ACETAMINOPHEN 325 MG PO TABS: 650.0000 mg | ORAL_TABLET | ORAL | Status: DC | PRN

## 2015-11-03 MED ORDER — LIDOCAINE HCL (PF) 1 % IJ SOLN
INTRAMUSCULAR | Status: AC
  Filled 2015-11-03: qty 30

## 2015-11-03 MED ORDER — SODIUM CHLORIDE 0.9 % IJ SOLN: 3.0000 mL | INTRAMUSCULAR | Status: DC | PRN

## 2015-11-03 MED ORDER — VERAPAMIL HCL 2.5 MG/ML IV SOLN
INTRAVENOUS | Status: AC
  Filled 2015-11-03: qty 2

## 2015-11-03 MED ORDER — ASPIRIN 81 MG PO CHEW
CHEWABLE_TABLET | ORAL | Status: AC
  Administered 2015-11-03: 81 mg via ORAL
  Filled 2015-11-03: qty 1

## 2015-11-03 SURGICAL SUPPLY — 12 items
CATH BALLN WEDGE 5F 110CM (CATHETERS) ×2 IMPLANT
CATH OPTITORQUE TIG 4.0 5F (CATHETERS) ×3 IMPLANT
DEVICE RAD COMP TR BAND LRG (VASCULAR PRODUCTS) ×3 IMPLANT
GLIDESHEATH SLEND A-KIT 6F 22G (SHEATH) ×3 IMPLANT
KIT HEART LEFT (KITS) ×3 IMPLANT
KIT HEART RIGHT NAMIC (KITS) ×3 IMPLANT
PACK CARDIAC CATHETERIZATION (CUSTOM PROCEDURE TRAY) ×3 IMPLANT
SHEATH FAST CATH BRACH 5F 5CM (SHEATH) ×2 IMPLANT
SYR MEDRAD MARK V 150ML (SYRINGE) ×3 IMPLANT
TRANSDUCER W/STOPCOCK (MISCELLANEOUS) ×4 IMPLANT
TUBING CIL FLEX 10 FLL-RA (TUBING) ×3 IMPLANT
WIRE SAFE-T 1.5MM-J .035X260CM (WIRE) ×3 IMPLANT

## 2015-11-03 NOTE — Discharge Instructions (Signed)
Radial Site Care °Refer to this sheet in the next few weeks. These instructions provide you with information about caring for yourself after your procedure. Your health care provider may also give you more specific instructions. Your treatment has been planned according to current medical practices, but problems sometimes occur. Call your health care provider if you have any problems or questions after your procedure. °WHAT TO EXPECT AFTER THE PROCEDURE °After your procedure, it is typical to have the following: °· Bruising at the radial site that usually fades within 1-2 weeks. °· Blood collecting in the tissue (hematoma) that may be painful to the touch. It should usually decrease in size and tenderness within 1-2 weeks. °HOME CARE INSTRUCTIONS °· Take medicines only as directed by your health care provider. °· You may shower 24-48 hours after the procedure or as directed by your health care provider. Remove the bandage (dressing) and gently wash the site with plain soap and water. Pat the area dry with a clean towel. Do not rub the site, because this may cause bleeding. °· Do not take baths, swim, or use a hot tub until your health care provider approves. °· Check your insertion site every day for redness, swelling, or drainage. °· Do not apply powder or lotion to the site. °· Do not flex or bend the affected arm for 24 hours or as directed by your health care provider. °· Do not push or pull heavy objects with the affected arm for 24 hours or as directed by your health care provider. °· Do not lift over 10 lb (4.5 kg) for 5 days after your procedure or as directed by your health care provider. °· Ask your health care provider when it is okay to: °¨ Return to work or school. °¨ Resume usual physical activities or sports. °¨ Resume sexual activity. °· Do not drive home if you are discharged the same day as the procedure. Have someone else drive you. °· You may drive 24 hours after the procedure unless otherwise  instructed by your health care provider. °· Do not operate machinery or power tools for 24 hours after the procedure. °· If your procedure was done as an outpatient procedure, which means that you went home the same day as your procedure, a responsible adult should be with you for the first 24 hours after you arrive home. °· Keep all follow-up visits as directed by your health care provider. This is important. °SEEK MEDICAL CARE IF: °· You have a fever. °· You have chills. °· You have increased bleeding from the radial site. Hold pressure on the site and call 911. °SEEK IMMEDIATE MEDICAL CARE IF: °· You have unusual pain at the radial site. °· You have redness, warmth, or swelling at the radial site. °· You have drainage (other than a small amount of blood on the dressing) from the radial site. °· The radial site is bleeding, and the bleeding does not stop after 30 minutes of holding steady pressure on the site. °· Your arm or hand becomes pale, cool, tingly, or numb. °  °This information is not intended to replace advice given to you by your health care provider. Make sure you discuss any questions you have with your health care provider. °  °Document Released: 12/18/2010 Document Revised: 12/06/2014 Document Reviewed: 06/03/2014 °Elsevier Interactive Patient Education ©2016 [employer] ° °

## 2015-11-03 NOTE — Interval H&P Note (Signed)
History and Physical Interval Note:  11/03/2015 7:17 AM  Oscar Brown  has presented today for surgery, with the diagnosis of abnormal echo - Severe Mitral Regurgitation.   The various methods of treatment have been discussed with the patient and family. After consideration of risks, benefits and other options for treatment, the patient has consented to  Procedure(s): Right/Left Heart Cath and Coronary Angiography (N/A) as a surgical intervention .  The patient's history has been reviewed, patient examined, no change in status, stable for surgery.  I have reviewed the patient's chart and labs.  Questions were answered to the patient's satisfaction.     HARDING, DAVID W

## 2015-11-03 NOTE — H&P (View-Only) (Signed)
Cardiology Office Note   Date:  10/09/2015   ID:  Oscar Brown, DOB June 21, 1966, MRN [id number]  PCP:  Karren Cobble, MD  Cardiologist:   Sharol Harness, MD   Chief Complaint  Patient presents with  . New Evaluation    post hosp--pt states he had SOB and dry cough for a couple days, he could not catch his breath, Dr. said leaky heart valve--pt states he has had no SOB since then  . Dizziness    once every other week sometimes--works third shift--if he bends down sometimes  . Edema    bilateral legs, feet, ankles--wearing compression hose--lymphedema on left leg      History of Present Illness: Oscar Brown is a 49 y.o. male with mitral regurgitation, obstructive sleep apnea, tobacco abuse and obesity who presents for follow-up after hospitalization for pulmonary edema and moderate to severe mitral regurgitation.  He was hospitalized October 23 to the 26 dyspnea and volume overload. He was successfully diuresed with Lasix.  Echo his mitral regurgitation appeared to be moderate to severe, however the calculated out to be mild to moderate.  His left ventricle was nondilated and his ejection fraction was normal.  Oscar Brown has known that his mitral valve was leaking for 1-2 years.  For two weeks prior to being admitted to the ED he noted a dry cough, lightheadedness and dizziness.  He thought that it was due to over exerting himself at work.  She has been working the third shift at work and notices that his symptoms are worse later in his shift.  He denies any chest pain but has noted shortness of breath and orthopnea.  He has noted LE edema, but it mostly improved with elevating his legs.  Past Medical History  Diagnosis Date  . 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  Complicated by chronic left lower extremity edema and occasional recurrent left lower extremity cellulitis   . Obstructive sleep apnea 12/14/2006    AHI 19.2/hr,  desaturation to 75% on Polysomnography 02/26/2005.  Uses 11 cm H2O nocturnal nasal CPAP with good symptom control.   . Obesity, Class II, BMI 35-39.9 12/14/2006  . Mitral regurgitation 10/06/2012    On examination   . Lymphedema   . Fatty liver     No past surgical history on file.   Current Outpatient Prescriptions  Medication Sig Dispense Refill  . furosemide (LASIX) 20 MG tablet Take 1 tablet (20 mg total) by mouth daily. 30 tablet 3  . Influenza vac split quadrivalent PF (FLUARIX) 0.5 ML injection Inject 0.5 mLs into the muscle once. 0.5 mL 0   No current facility-administered medications for this visit.    Allergies:   Review of patient's allergies indicates no known allergies.    Social History:  The patient  reports that he quit smoking about 3 years ago. His smoking use included Cigarettes. He smoked 0.10 packs per day. He has never used smokeless tobacco. He reports that he does not drink alcohol or use illicit drugs.   Family History:  The patient's  family history is not on file.    ROS:  Please see the history of present illness.   Otherwise, review of systems are positive for none.   All other systems are reviewed and negative.    PHYSICAL EXAM: VS:  BP 116/82 mmHg  Pulse 57  Ht 5\' 8"  (1.727 m)  Wt 98.839 kg (217 lb 14.4  oz)  BMI 33.14 kg/m2 , BMI Body mass index is 33.14 kg/(m^2). GENERAL:  Well appearing HEENT:  Pupils equal round and reactive, fundi not visualized, oral mucosa unremarkable NECK:  No jugular venous distention, waveform within normal limits, carotid upstroke brisk and symmetric, no bruits, no thyromegaly LYMPHATICS:  No cervical adenopathy LUNGS:  Clear to auscultation bilaterally HEART:  RRR.  PMI not displaced or sustained,S1 and S2 within normal limits, no S3, no S4, no clicks, no rubs, II/VI early-peaking systolic murmur at the RUSB, III/VI holosystolic murmur at the apex. ABD:  Flat, positive bowel sounds normal in frequency in pitch, no  bruits, no rebound, no guarding, no midline pulsatile mass, no hepatomegaly, no splenomegaly EXT:  2 plus pulses throughout, no edema, no cyanosis no clubbing.  Wearing TED hose. SKIN:  No rashes no nodules NEURO:  Cranial nerves II through XII grossly intact, motor grossly intact throughout PSYCH:  Cognitively intact, oriented to person place and time    EKG:  EKG is ordered today. The ekg ordered today demonstrates sinus bradycardia rate 57 bpm.    Echo 09/22/15: Study Conclusions  - Left ventricle: The cavity size was normal. Systolic function was normal. The estimated ejection fraction was in the range of 55% to 60%. Wall motion was normal; there were no regional wall motion abnormalities. - Aortic valve: Moderate diffuse thickening and calcification, consistent with sclerosis. - Mitral valve: Mild diffuse calcification of the anterior leaflet. Visually there appears to be moderate to severe regurgitation directed eccentrically and posteriorly on the apical 4 chamber and 2 chamber views. The MR is calculated as mild to mdoerate using the ERO and MR volume. Consider TEE to assess degree of MR further. - Left atrium: The atrium was severely dilated. - Right ventricle: The cavity size was mildly dilated. Wall thickness was normal. - Pulmonary arteries: PA peak pressure: 35 mm Hg (S).  Impressions:  - The right ventricular systolic pressure was increased consistent with mild pulmonary hypertension.   Recent Labs: 05/28/2015: ALT 11* 09/21/2015: B Natriuretic Peptide 282.0*; Hemoglobin 13.2; Platelets 191 09/30/2015: BUN 21; Creatinine, Ser 0.89; Potassium 4.6; Sodium 136    Lipid Panel    Component Value Date/Time   CHOL 187 09/22/2015 0200   TRIG 54 09/22/2015 0200   HDL 60 09/22/2015 0200   CHOLHDL 3.1 09/22/2015 0200   VLDL 11 09/22/2015 0200   LDLCALC 116* 09/22/2015 0200      Wt Readings from Last 3 Encounters:  10/09/15 98.839 kg (217  lb 14.4 oz)  09/30/15 95.845 kg (211 lb 4.8 oz)  09/23/15 94.956 kg (209 lb 5.4 oz)      ASSESSMENT AND PLAN:  # Mitral regurgitation: Oscar Brown transthoracic echo revealed mild to moderate mitral regurgitation by ERO, but visually appeared more severe and the jet was eccentrically located.  His EF was only 55-60%, though his left ventricle was not dilated. We will obtain a transesophageal echo to further evaluate the severity of his mitral valve regurgitation. It is likely that he may need mitral valve repair/replacement due to symptomatic severe mitral regurgitation. He appears euvolemic on exam today. Continue furosemide 20 mg daily. We'll check a basic metabolic panel to evaluate his renal function and electrolytes on this therapy.  # Tobacco abuse: Mr. Deal quit smoking during his hospitalization. He was congratulated on these efforts. He continues to use a patch for smoking cessation 7 days, but does not need to use it every day.  # CV Disease Prevention:  Lipids  at goal.  Current medicines are reviewed at length with the patient today.  The patient does not have concerns regarding medicines.  The following changes have been made:  no change  Labs/ tests ordered today include:  No orders of the defined types were placed in this encounter.     Disposition:   FU with Seena Face C. Oval Linsey, MD in 3 months.    Signed, Sharol Harness, MD  10/09/2015 10:20 AM    [place]

## 2015-11-04 ENCOUNTER — Other Ambulatory Visit: Payer: Self-pay | Admitting: *Deleted

## 2015-11-04 ENCOUNTER — Encounter: Payer: Self-pay | Admitting: Thoracic Surgery (Cardiothoracic Vascular Surgery)

## 2015-11-04 ENCOUNTER — Institutional Professional Consult (permissible substitution) (INDEPENDENT_AMBULATORY_CARE_PROVIDER_SITE_OTHER): Payer: Managed Care, Other (non HMO) | Admitting: Thoracic Surgery (Cardiothoracic Vascular Surgery)

## 2015-11-04 VITALS — BP 132/87 | HR 56 | Resp 16 | Ht 68.0 in | Wt 217.0 lb

## 2015-11-04 DIAGNOSIS — I340 Nonrheumatic mitral (valve) insufficiency: Secondary | ICD-10-CM

## 2015-11-04 DIAGNOSIS — I719 Aortic aneurysm of unspecified site, without rupture: Secondary | ICD-10-CM

## 2015-11-04 DIAGNOSIS — I7409 Other arterial embolism and thrombosis of abdominal aorta: Secondary | ICD-10-CM

## 2015-11-04 DIAGNOSIS — I5032 Chronic diastolic (congestive) heart failure: Secondary | ICD-10-CM | POA: Insufficient documentation

## 2015-11-04 NOTE — Patient Instructions (Signed)

## 2015-11-04 NOTE — Progress Notes (Signed)
[address]       [place] [address]             [phone]     CARDIOTHORACIC SURGERY CONSULTATION REPORT  Referring Provider is Skeet Latch, MD PCP is Karren Cobble, MD  Chief Complaint  Patient presents with  . Mitral Regurgitation    eval for MVR.Marland KitchenECHO 09/22/15.Marland KitchenMarland KitchenCATH 11/03/15    HPI:  Patient is a 49 year old moderately obese African-American male with OSA, lymphedema and remote h/o DVT who is referred for surgical consultation of recently discovered severe symptomatic mitral regurgitation. Patient states that he was first noted to have a heart murmur on physical exam to her 3 years ago by his primary care physician. He has remained physically active and asymptomatic until recently. In October he developed fairly rapid onset symptoms of exertional shortness of breath and orthopnea with mild lower extremity edema.  Symptoms progressed until he eventually presented to the hospital where he was hospitalized with acute diastolic congestive heart failure.  Transthoracic echocardiogram revealed mitral regurgitation with normal left ventricular systolic function.  Ejection fraction was estimated 55-60%.  Visually the mitral regurgitation was felt to be moderate to severe, but calculations suggested the presence of only moderate mitral regurgitation with ERO measured 0.19 corresponding to regurgitant volume estimated 39 mL.  The patient responded well to diuretic therapy and was seen in follow-up by Dr. Oval Linsey who performed TEE on 10/16/2015 revealed thickened mitral valve leaflets with severely restricted posterior leaflet and severe mitral regurgitation.  ERO measured 0.48 corresponding to a regurgitant volume estimated 91 mL and regurgitant fraction estimated 60%.  Left ventricular size and systolic function appeared normal. The aortic valve was normal. Right ventricular size and systolic function was normal. There was trivial tricuspid regurgitation. The  patient subsequently underwent left and right heart catheterization on 11/03/2015. The patient was found to have mild nonobstructive coronary artery disease and normal pulmonary artery pressures. The patient was referred for elective surgical consultation.  The patient is separated from his wife and lives locally in [place]. He has 2 adult children and one 69 year old son. 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  He has been physically active all of his life. He reports no significant physical limitations. He states that up until recently has not experience any significant symptoms of exertional shortness of breath. He states that the symptoms progressed fairly rapidly in October, ultimately causing him to present to the hospital as previously stated. Since hospital discharge the patient has done well on medical therapy. He denies any history of exertional chest pain or chest tightness. He had severe exertional shortness of breath, orthopnea, and a dry non-productive cough at the time of his recent hospital admission. The symptoms have improved. He has had one dizzy spell without any history of syncope. He denies any history of palpitations.    Past Medical History  Diagnosis Date  . 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  Complicated by chronic left lower extremity edema and occasional recurrent left lower extremity cellulitis   . Obstructive sleep apnea 12/14/2006    AHI 19.2/hr, desaturation to 75% on Polysomnography 02/26/2005.  Uses 11 cm H2O nocturnal nasal CPAP with good symptom control.   . Obesity, Class II, BMI 35-39.9 12/14/2006  . Mitral regurgitation 10/06/2012  . Lymphedema   . Fatty liver   . Acute diastolic congestive heart  failure ([facility]) 09/21/2015    Past Surgical History  Procedure Laterality Date  . Tee without cardioversion N/A 10/16/2015    Procedure:  TRANSESOPHAGEAL ECHOCARDIOGRAM (TEE);  Surgeon: Skeet Latch, MD;  Location: [facility];  Service: Cardiovascular;  Laterality: N/A;  . Cardiac catheterization N/A 11/03/2015    Procedure: Right/Left Heart Cath and Coronary Angiography;  Surgeon: Leonie Man, MD;  Location: [facility];  Service: Cardiovascular;  Laterality: N/A;    No family history on file.  Social History   Social History  . Marital Status: Legally Separated    Spouse Name: N/A  . Number of Children: N/A  . Years of Education: N/A   Occupational History  . Not on file.   Social History Main Topics  . Smoking status: Former Smoker -- 0.10 packs/day for 9 years    Types: Cigarettes    Quit date: 09/05/2012  . Smokeless tobacco: Never Used  . Alcohol Use: No  . Drug Use: No  . Sexual Activity: Not on file   Other Topics Concern  . Not on file   Social History Narrative    Current Outpatient Prescriptions  Medication Sig Dispense Refill  . furosemide (LASIX) 20 MG tablet Take 1 tablet (20 mg total) by mouth daily. 30 tablet 3   No current facility-administered medications for this visit.    No Known Allergies    Review of Systems:   General:  normal appetite, normal energy, no weight gain, no weight loss, no fever  Cardiac:  no chest pain with exertion, no chest pain at rest, + SOB with exertion, no resting SOB, no PND, + orthopnea, no palpitations, no arrhythmia, no atrial fibrillation, + LE edema, + dizzy spells, no syncope  Respiratory:  + shortness of breath, no home oxygen, no productive cough, + dry cough, no bronchitis, no wheezing, no hemoptysis, no asthma, no pain with inspiration or cough, + sleep apnea, + CPAP at night  GI:   no difficulty swallowing, no reflux, no frequent heartburn, no hiatal hernia, no abdominal pain, no constipation, no diarrhea, no hematochezia, no hematemesis, no melena  GU:   no dysuria,  no frequency, no urinary tract infection, no hematuria, no  enlarged prostate, no kidney stones, no kidney disease  Vascular:  no pain suggestive of claudication, no pain in feet, no leg cramps, no varicose veins, + DVT, no non-healing foot ulcer  Neuro:   no stroke, no TIA's, no seizures, no headaches, no temporary blindness one eye,  no slurred speech, no peripheral neuropathy, no chronic pain, no instability of gait, no memory/cognitive dysfunction  Musculoskeletal: no arthritis, no joint swelling, no myalgias, no difficulty walking, normao mobility   Skin:   no rash, no itching, no skin infections, no pressure sores or ulcerations  Psych:   no anxiety, no depression, no nervousness, no unusual recent stress  Eyes:   occasional blurry vision, no floaters, no recent vision changes, does not wears glasses or contacts  ENT:   no hearing loss, no loose or painful teeth, no dentures, last saw dentist many years ago  Hematologic:  no easy bruising, no abnormal bleeding, no clotting disorder, no frequent epistaxis  Endocrine:  no diabetes, does not check CBG's at home     Physical Exam:   BP 132/87 mmHg  Pulse 56  Resp 16  Ht 5' 8"  (1.727 m)  Wt 217 lb (98.431 kg)  BMI 33.00 kg/m2  SpO2 98%  General:  Moderately obese, o/w  well-appearing  HEENT:  Unremarkable   Neck:   no JVD, no bruits, no adenopathy   Chest:   clear to auscultation, symmetrical breath sounds, no wheezes, no rhonchi   CV:   RRR, grade III/VI holosystolic murmur   Abdomen:  soft, non-tender, no masses   Extremities:  warm, well-perfused, pulses diminished but palpable, + mild LE edema L>R  Rectal/GU  Deferred  Neuro:   Grossly non-focal and symmetrical throughout  Skin:   Clean and dry, no rashes, no breakdown   Diagnostic Tests:  Transthoracic Echocardiography  Patient:  Chesky, Heyer MR #:    [id number] Study Date: 09/22/2015 Gender:   M Age:    25 Height:   172.7 cm Weight:   96.6 kg BSA:    2.18 m^2 Pt. Status: Room:     3E12C  SONOGRAPHER Diamond Nickel PERFORMING  [facility], Inpatient ADMITTING  Axel Filler ATTENDING  Lalla Brothers Halford Chessman, Duncan [facility], Duncan Thomas  cc:  ------------------------------------------------------------------- LV EF: 55% -  60%  ------------------------------------------------------------------- Indications:   Dyspnea 786.09.  ------------------------------------------------------------------- History:  PMH: Obstructive sleep apnea. Obesity. Mitral regurgitation. Lymphedema.  ------------------------------------------------------------------- Study Conclusions  - Left ventricle: The cavity size was normal. Systolic function was normal. The estimated ejection fraction was in the range of 55% to 60%. Wall motion was normal; there were no regional wall motion abnormalities. - Aortic valve: Moderate diffuse thickening and calcification, consistent with sclerosis. - Mitral valve: Mild diffuse calcification of the anterior leaflet. Visually there appears to be moderate to severe regurgitation directed eccentrically and posteriorly on the apical 4 chamber and 2 chamber views. The MR is calculated as mild to mdoerate using the ERO and MR volume. Consider TEE to assess degree of MR further. - Left atrium: The atrium was severely dilated. - Right ventricle: The cavity size was mildly dilated. Wall thickness was normal. - Pulmonary arteries: PA peak pressure: 35 mm Hg (S).  Impressions:  - The right ventricular systolic pressure was increased consistent with mild pulmonary hypertension.  Transthoracic echocardiography. M-mode, complete 2D, spectral Doppler, and color Doppler. Birthdate: Patient birthdate: 07-02-1966. Age: Patient is 49 yr old. Sex: Gender: male. BMI: 32.4 kg/m^2. Blood pressure:   132/80 Patient status: Inpatient. Study date: Study date:  09/22/2015. Study time: 10:45 AM. Location: Echo laboratory.  -------------------------------------------------------------------  ------------------------------------------------------------------- Left ventricle: The cavity size was normal. Systolic function was normal. The estimated ejection fraction was in the range of 55% to 60%. Wall motion was normal; there were no regional wall motion abnormalities.  ------------------------------------------------------------------- Aortic valve:  Trileaflet. Moderate diffuse thickening and calcification, consistent with sclerosis. Mobility was not restricted. Doppler: Transvalvular velocity was within the normal range. There was no stenosis. There was no regurgitation.  ------------------------------------------------------------------- Aorta: Aortic root: The aortic root was normal in size.  ------------------------------------------------------------------- Mitral valve:  Mild diffuse calcification of the anterior leaflet. Mobility was not restricted. Doppler: Transvalvular velocity was within the normal range. There was no evidence for stenosis. Visually there appears to be moderate to severe regurgitation directed eccentrically and posteriorly on the apical 4 chamber and 2 chamber views. The MR is calculated as mild to mdoerate using the ERO and MR volume. Consider TEE to assess degree of MR further Peak gradient (D): 5 mm Hg.  ------------------------------------------------------------------- Left atrium: The atrium was severely dilated.  ------------------------------------------------------------------- Right ventricle: The cavity size was mildly dilated. Wall thickness was normal. Systolic function was normal.  ------------------------------------------------------------------- Pulmonic valve:  Structurally normal valve.  Cusp  separation was normal. Doppler: Transvalvular velocity was within the  normal range. There was no evidence for stenosis. There was no regurgitation.  ------------------------------------------------------------------- Tricuspid valve:  Structurally normal valve.  Doppler: Transvalvular velocity was within the normal range. There was mild regurgitation.  ------------------------------------------------------------------- Pulmonary artery:  The main pulmonary artery was normal-sized. Systolic pressure was within the normal range.  ------------------------------------------------------------------- Right atrium: The atrium was normal in size.  ------------------------------------------------------------------- Pericardium: There was no pericardial effusion.  ------------------------------------------------------------------- Systemic veins: Inferior vena cava: The vessel was normal in size.  ------------------------------------------------------------------- Measurements  Left ventricle             Value    Reference LV ID, ED, PLAX chordal        47.7 mm   43 - 52 LV ID, ES, PLAX chordal        35.4 mm   23 - 38 LV fx shortening, PLAX chordal (L)   26  %   >=29 LV PW thickness, ED          12.3 mm   --------- IVS/LV PW ratio, ED          0.77     <=1.3 LV e&', lateral             11.1 cm/s  --------- LV E/e&', lateral            9.91     --------- LV e&', medial             7.28 cm/s  --------- LV E/e&', medial            15.11    --------- LV e&', average             9.19 cm/s  --------- LV E/e&', average            11.97    ---------  Ventricular septum           Value    Reference IVS thickness, ED           9.43 mm   ---------  LVOT                  Value    Reference LVOT ID, S               20   mm   --------- LVOT area               3.14 cm^2  ---------  Aorta                 Value    Reference Aortic root ID, ED           34  mm   ---------  Left atrium              Value    Reference LA ID, A-P, ES             41  mm   --------- LA ID/bsa, A-P             1.88 cm/m^2 <=2.2 LA volume, S              175  ml   --------- LA volume/bsa, S            80.1 ml/m^2 --------- LA volume, ES, 1-p A4C         181  ml   --------- LA volume/bsa, ES, 1-p A4C       82.8 ml/m^2 --------- LA  volume, ES, 1-p A2C         172  ml   --------- LA volume/bsa, ES, 1-p A2C       78.7 ml/m^2 ---------  Mitral valve              Value    Reference Mitral E-wave peak velocity      110  cm/s  --------- Mitral A-wave peak velocity      108  cm/s  --------- Mitral deceleration time    (H)   532  ms   150 - 230 Mitral peak gradient, D        5   mm Hg --------- Mitral E/A ratio, peak         1      --------- Aliasing velocity, MR PISA       38.5 cm/s  --------- Mitral regurg PISA radius       7   mm   --------- Mitral regurg VTI, PISA        207  cm   --------- Mitral ERO, PISA            0.19 cm^2  --------- Mitral regurg volume, PISA       39  ml   ---------  Pulmonary arteries           Value    Reference PA pressure, S, DP       (H)   35  mm Hg <=30  Tricuspid valve            Value    Reference Tricuspid regurg peak velocity     281  cm/s  --------- Tricuspid peak RV-RA gradient     32  mm Hg ---------  Systemic veins             Value    Reference Estimated CVP             3   mm Hg  ---------  Right ventricle            Value    Reference RV pressure, S, DP       (H)   35  mm Hg <=30 RV s&', lateral, S           15.8 cm/s  ---------  Legend: (L) and (H) mark values outside specified reference range.  ------------------------------------------------------------------- Prepared and Electronically Authenticated by  Fransico Him, MD 2016-10-24T15:51:26    Transesophageal Echocardiography  Patient:  Oscar Brown, Oscar Brown MR #:    [id number] Study Date: 10/16/2015 Gender:   M Age:    49 Height:   172.7 cm Weight:   98.4 kg BSA:    2.21 m^2 Pt. Status: Room:  SONOGRAPHER Darlina Sicilian, RDCS ADMITTING  Skeet Latch, MD ATTENDING  Skeet Latch, MD ORDERING   Skeet Latch, MD PERFORMING  Skeet Latch, MD REFERRING  Skeet Latch, MD  cc:  -------------------------------------------------------------------  ------------------------------------------------------------------- Indications:   Mitral Valve Disorder 424.0/ I05.9.  ------------------------------------------------------------------- History:  PMH: Dyspnea/ Volume Overload. Sleep Apnea. Chronic Venous HTN due to DVT.  ------------------------------------------------------------------- Study Conclusions  - Left ventricle: Systolic function was normal. Wall motion was normal; there were no regional wall motion abnormalities. - Aortic valve: Valve area (VTI): 2.06 cm^2. Valve area (Vmax): 2.35 cm^2. Valve area (Vmean): 2.03 cm^2. - Mitral valve: Mobility of the posterior leaflet was restricted. There was severe regurgitation. Aliasing velocity of regurgitation (PISA): 34.3 cm/s. Radius of regurgitation (PISA): 11 mm. Effective regurgitant orifice (PISA): 0.48 cm^2. Regurgitant  volume (PISA): 90.9 ml. - Left atrium: The atrium was dilated. No evidence of thrombus in the atrial  cavity or appendage. No evidence of thrombus in the atrial cavity or appendage. - Left upper pulmonary vein: There was systolic flow reversal. - Right ventricle: The cavity size was normal. Wall thickness was normal. Systolic function was normal. - Right atrium: No evidence of thrombus in the atrial cavity or appendage. - Atrial septum: No defect or patent foramen ovale was identified by color flow Doppler. - Tricuspid valve: There was trivial regurgitation.  Impressions:  - Severe mitral regurgitation due to a tethered posterior mitral valve leaflet.  Diagnostic transesophageal echocardiography. 2D and color Doppler. Birthdate: Patient birthdate: 08-Oct-1966. Age: Patient is 49 yr old. Sex: Gender: male.  BMI: 33 kg/m^2. Blood pressure: 116/82 Patient status: Inpatient. Study date: Study date: 10/16/2015. Study time: 07:49 AM. Location: Endoscopy.  -------------------------------------------------------------------  ------------------------------------------------------------------- Left ventricle: Systolic function was normal. Wall motion was normal; there were no regional wall motion abnormalities.  ------------------------------------------------------------------- Aortic valve:  Structurally normal valve. Trileaflet; normal thickness leaflets. Cusp separation was normal. Doppler: There was no significant regurgitation.  VTI ratio of LVOT to aortic valve: 0.66. Valve area (VTI): 2.06 cm^2. Indexed valve area (VTI): 0.93 cm^2/m^2. Peak velocity ratio of LVOT to aortic valve: 0.75. Valve area (Vmax): 2.35 cm^2. Indexed valve area (Vmax): 1.06 cm^2/m^2. Mean velocity ratio of LVOT to aortic valve: 0.65. Valve area (Vmean): 2.03 cm^2. Indexed valve area (Vmean): 0.92 cm^2/m^2.  Mean gradient (S): 5 mm Hg. Peak gradient (S): 8 mm Hg.  ------------------------------------------------------------------- Aorta: There was no atheroma. There was no  evidence for dissection. Aortic root: The aortic root was not dilated. Ascending aorta: The ascending aorta was normal in size. Aortic arch: The aortic arch was normal in size. Descending aorta: The descending aorta was normal in size.  ------------------------------------------------------------------- Mitral valve: Leaflet separation was normal. Mobility of the posterior leaflet was restricted. Doppler: There was severe regurgitation.  ------------------------------------------------------------------- Left atrium: The atrium was dilated. No evidence of thrombus in the atrial cavity or appendage. No evidence of thrombus in the atrial cavity or appendage. The appendage was morphologically a left appendage, multilobulated, and of normal size. Emptying velocity was normal.  ------------------------------------------------------------------- Atrial septum: No defect or patent foramen ovale was identified by color flow Doppler.  ------------------------------------------------------------------- Pulmonary veins: Left upper pulmonary vein: There was systolic flow reversal.  ------------------------------------------------------------------- Right ventricle: The cavity size was normal. Wall thickness was normal. Systolic function was normal.  ------------------------------------------------------------------- Pulmonic valve:  Structurally normal valve.  ------------------------------------------------------------------- Tricuspid valve:  Structurally normal valve.  Leaflet separation was normal. Doppler: There was trivial regurgitation.  ------------------------------------------------------------------- Pulmonary artery:  The main pulmonary artery was normal-sized.  ------------------------------------------------------------------- Right atrium: The atrium was normal in size. No evidence of thrombus in the atrial cavity or appendage. The appendage  was morphologically a right appendage.  ------------------------------------------------------------------- Pericardium: There was no pericardial effusion.  ------------------------------------------------------------------- Measurements  Left ventricle              Value Stroke volume, 2D            61   ml Stroke volume/bsa, 2D          28   ml/m^2  LVOT                   Value LVOT ID, S                20   mm LVOT area  3.14  cm^2 LVOT peak velocity, S          104  cm/s LVOT mean velocity, S          66.6  cm/s LVOT VTI, S               19.3  cm Stroke volume (SV), LVOT DP       60.6  ml Stroke index (SV/bsa), LVOT DP      27.5  ml/m^2  Aortic valve               Value Aortic valve peak velocity, S      139  cm/s Aortic valve mean velocity, S      103  cm/s Aortic valve VTI, S           29.4  cm Aortic mean gradient, S         5   mm Hg Aortic peak gradient, S         8   mm Hg VTI ratio, LVOT/AV            0.66 Aortic valve area, VTI          2.06  cm^2 Aortic valve area/bsa, VTI        0.93  cm^2/m^2 Velocity ratio, peak, LVOT/AV      0.75 Aortic valve area, peak velocity     2.35  cm^2 Aortic valve area/bsa, peak velocity   1.06  cm^2/m^2 Velocity ratio, mean, LVOT/AV      0.65 Aortic valve area, mean velocity     2.03  cm^2 Aortic valve area/bsa, mean velocity   0.92  cm^2/m^2  Left atrium               Value LA appendage peak velocity        77.02 cm/s  Mitral valve               Value Mitral annulus diameter, A-P       31.9  mm Mitral maximal inflow velocity, PISA   501.47 cm/s Mitral annulus VTI, D           168.3 cm Mitral transvalvular flow, D       1169.9 ml Aliasing velocity, MR PISA        34.3  cm/s Mitral regurg PISA radius        11   mm Mitral maximal regurg velocity, PISA   539.53 cm/s Mitral regurg VTI, PISA         188  cm Mitral ERO, PISA             0.48  cm^2 Mitral regurg volume, PISA        90.9  ml Mitral regurg fraction, PISA       59.98 %  Legend: (L) and (H) mark values outside specified reference range.  ------------------------------------------------------------------- Prepared and Electronically Authenticated by  Skeet Latch, MD 2016-11-17T10:53:52    CARDIAC CATHETERIZATION  Procedures    Right/Left Heart Cath and Coronary Angiography    Conclusion    1. Angiographically minimal CAD with most significant being 45% distal circumflex-OM 3 stenosis 2. The left ventricular systolic function is normal -hand injection left ventricular angiography did not allow for significant evaluation of MR. 3. Normal right heart cath pressures but no pulmonary hypertension. No large V wave noted on the PCWP tracing. 4. TEE documentation of Severe MR   The patient does not have any significant CAD that would require  bypass grafting. PA pressure stable.  Plan:  Standard post radial cath care with TR band removal. Discharge after Bed Rest & TR Band removal.  He should be able to return to work within 48 hours.  Will refer back to Dr. Oval Linsey to schedule CT surgical consultation.   Leonie Man, M.D., M.S. Interventional Cardiologist   Pager # [phone]      Indications    Severe mitral regurgitation [I34.0 (ICD-10-CM)]    Technique and Indications    PCP: Karren Cobble, MD Cardiologist: Sharol Harness, MD \   49 year old gentleman with recently diagnosed severe MR by TEE. He has had an admission for acute heart failure in the past  few months. He was evaluated initially with an echocardiogram which had conflicting data. TEE confirmed severe MR and he is now referred for right heart catheterization for potential preoperative valve repair.  PROCEDURES: Estimated blood loss <50 mL. There were no immediate complications during the procedure.  Time Out: Verified patient identification, verified procedure, site/side was marked, verified correct patient position, special equipment/implants available, medications/allergies/relevent history reviewed, required imaging and test results available. Performed.  The patient was sedated with 50 g IV fentanyl, 2 mgIV Versed  Access:  RIGHT Radial Artery: 6 Fr sheath -- Seldinger technique using Angiocath Micropuncture Kit * 5 mL radial cocktail IA (Radial Cocktail: 5 mg Verapamil, 400 mcg NTG, 2 ml 2% Lidocaine in 10 ml NS -- 5 mL given * 4000 Units IV Heparin  Right Brachial/Antecubital Vein: The existing 18-gauge IV was exchanged over a wire for a 5Fr short sheath  Right Heart Catheterization: 5 Fr Swan Ganz catheter advanced under fluoroscopy with balloon inflated to the RA, RV, then PCWP-PA for hemodynamic measurement.  Simultaneous FA & PA blood gases checked for SaO2% to calculate FICK CO/CI  Simultaneous PCWP/LV & RV/LV pressures monitored with Angled Pigtail in LV.  Catheter removed completely out of the body with balloon deflated.  Left Heart Catheterization: 5Fr TIG Catheter advanced or exchanged over a J-wire under direct fluoroscopic guidance into the ascending aorta - used to engage the coronary arteries & across the Aortic Valve for LV Hemodynamics & had injection LV Gram). Left & Right Coronary Artery Cineangiography, LV Hemodynamics & LV gram: TIG 4.0 Catheter   Brachial Sheath(s) removed in the PACU holding area with manual pressure for hemostasis.   Radial sheath removed in the Cardiac Catheterization lab with TR Band placed for hemostasis.  TR Band: 0845 Hours; 12  mL air    Coronary Findings    Dominance: Right   Left Main  Vessel was injected. Vessel is large. Vessel is angiographically normal. TR 4 Catheter     Left Anterior Descending  . Vessel is large. The vessel exhibits minimal luminal irregularities.   . First Diagonal Branch   The vessel is normal in size.   . Lateral First Diagonal Branch   The vessel is moderate in size.   . First Septal Branch   The vessel is small in size.   Marland Kitchen Second Diagonal Branch   The vessel is moderate in size.   Marland Kitchen Second Septal Branch   The vessel is small in size.   . Third Diagonal Branch   The vessel is small in size.     Left Circumflex   . Mid Cx lesion, 45% stenosed. Tubular eccentric.   . First Obtuse Marginal Branch   The vessel is moderate in size and exhibits minimal luminal irregularities.   . Second  Obtuse Marginal Branch   The vessel is small in size.   . Lateral Third Obtuse Marginal Branch   The vessel is small in size.     Right Coronary Artery  . Vessel is large.   . Prox RCA lesion, 30% stenosed. Diffuse tubular eccentric.   Marland Kitchen Acute Marginal Branch   The vessel is moderate in size.   . Right Posterior Descending Artery   The vessel is moderate in size and exhibits minimal luminal irregularities.   . Inferior Septal   The vessel is small in size.   . Right Posterior Atrioventricular Branch   The vessel is moderate in size.   . First Right Posterolateral   The vessel is small in size.       Right Heart Pressures Hemodynamic findings consistent with mitral valve regurgitation. LV EDP is normal. There does not appear to be a large V wave in PA waveform.    Left Heart    Left Ventricle The left ventricular size is normal. The left ventricular systolic function is normal. The left ventricular ejection fraction is 55-65% by visual estimate. The ejection fraction could not be assessed due to underfilling.   Mitral Valve There is mild (2+) mitral regurgitation. Hard to  truly visualize   Aortic Valve There is no aortic valve stenosis, and no aortic valve regurgitation.    Coronary Diagrams    Diagnostic Diagram            Implants    Name ID Temporary Type Supply   No information to display    PACS Images    Show images for Cardiac catheterization     Link to Procedure Log    Procedure Log      Hemo Data       Most Recent Value   Fick Cardiac Output  5.45 L/min   Fick Cardiac Output Index  2.56 (L/min)/BSA   RA A Wave  8 mmHg   RA V Wave  8 mmHg   RA Mean  7 mmHg   RV Systolic Pressure  26 mmHg   RV Diastolic Pressure  7 mmHg   RV EDP  9 mmHg   PA Systolic Pressure  20 mmHg   PA Diastolic Pressure  4 mmHg   PA Mean  13 mmHg   PW A Wave  10 mmHg   PW V Wave  9 mmHg   PW Mean  7 mmHg   AO Systolic Pressure  503 mmHg   AO Diastolic Pressure  70 mmHg   AO Mean  84 mmHg   LV Systolic Pressure  96 mmHg   LV Diastolic Pressure  1 mmHg   LV EDP  5 mmHg   Arterial Occlusion Pressure Extended Systolic Pressure  546 mmHg   Arterial Occlusion Pressure Extended Diastolic Pressure  72 mmHg   Arterial Occlusion Pressure Extended Mean Pressure  87 mmHg   Left Ventricular Apex Extended Systolic Pressure  568 mmHg   Left Ventricular Apex Extended Diastolic Pressure  1 mmHg   Left Ventricular Apex Extended EDP Pressure  7 mmHg   QP/QS  1   TPVR Index  5.08 HRUI   TSVR Index  32.83 HRUI   PVR SVR Ratio  0.08   TPVR/TSVR Ratio  0.15        Impression:  Patient has stage D severe symptomatic primary mitral regurgitation.  He was recently hospitalized with symptoms of acute diastolic congestive heart failure that have improved considerably with medical  therapy.  I have personally reviewed the patient's recent echocardiograms and diagnostic cardiac catheterization.  Transesophageal echocardiogram demonstrates the presence of moderately thickened leaflets of the mitral valve with severely restricted  leaflet mobility involving the posterior leaflet and moderate foreshortening of the subvalvular apparatus causing type IIIA dysfunction with severe mitral regurgitation.   Left ventricular systolic function appears reasonably well preserved. Diagnostic cardiac catheterization is notable for absence of significant coronary artery disease and relatively normal pulmonary artery pressures.  I agree the patient would best be treated with elective mitral valve repair or replacement. There remains a possibility that his valve might be repairable, although in the setting of likely rheumatic disease the likelihood of repair is reduced and the potential durability following successful repair probably muted. The patient appears to be relatively good candidate for minimally invasive approach for surgery. He has not seen a dentist in many years and will need dental clearance prior to surgery.   Plan:  The patient was counseled at length regarding the indications, risks and potential benefits of mitral valve repair.  The rationale for elective surgery has been explained, including a comparison between surgery and continued medical therapy with close follow-up.  The likelihood of successful and durable valve repair has been discussed with particular reference to the findings of their recent echocardiogram.  Based upon these findings and previous experience, I have quoted them a 50 percent likelihood of successful valve repair.  In the event that their valve cannot be successfully repaired, we discussed the possibility of replacing the mitral valve using a mechanical prosthesis with the attendant need for long-term anticoagulation versus the alternative of replacing it using a bioprosthetic tissue valve with its potential for late structural valve deterioration and failure, depending upon the patient's longevity.  The patient specifically requests that if the mitral valve must be replaced that it be done using a mechanical  valve.   The patient understands and accepts all potential risks of surgery including but not limited to risk of death, stroke or other neurologic complication, myocardial infarction, congestive heart failure, respiratory failure, renal failure, bleeding requiring transfusion and/or reexploration, arrhythmia, infection or other wound complications, pneumonia, pleural and/or pericardial effusion, pulmonary embolus, aortic dissection or other major vascular complication, or delayed complications related to valve repair or replacement including but not limited to structural valve deterioration and failure, thrombosis, embolization, endocarditis, or paravalvular leak.  Alternative surgical approaches have been discussed including a comparison between conventional sternotomy and minimally-invasive techniques.  The relative risks and benefits of each have been reviewed as they pertain to the patient's specific circumstances, and all of their questions have been addressed.  Specific risks potentially related to the minimally-invasive approach were discussed at length, including but not limited to risk of conversion to full or partial sternotomy, aortic dissection or other major vascular complication, unilateral acute lung injury or pulmonary edema, phrenic nerve dysfunction or paralysis, rib fracture, chronic pain, lung hernia, or lymphocele.  All of their questions have been answered.  We tentatively plan to proceed with surgery on Thursday, 11/13/2015. The patient will be referred for dental service evaluation prior to surgery. He will return for follow-up on Tuesday, 11/11/2015.   I spent in excess of 90 minutes during the conduct of this office consultation and >50% of this time involved direct face-to-face encounter with the patient for counseling and/or coordination of their care.   Valentina Gu. Roxy Manns, MD 11/04/2015 3:15 PM

## 2015-11-05 ENCOUNTER — Encounter (INDEPENDENT_AMBULATORY_CARE_PROVIDER_SITE_OTHER): Payer: Self-pay

## 2015-11-05 ENCOUNTER — Other Ambulatory Visit (HOSPITAL_COMMUNITY): Payer: Self-pay | Admitting: Dentistry

## 2015-11-05 ENCOUNTER — Encounter (HOSPITAL_COMMUNITY): Payer: Self-pay | Admitting: Dentistry

## 2015-11-05 ENCOUNTER — Ambulatory Visit (HOSPITAL_COMMUNITY): Payer: Self-pay | Admitting: Dentistry

## 2015-11-05 VITALS — BP 112/61 | HR 56 | Temp 98.6°F

## 2015-11-05 DIAGNOSIS — K0889 Other specified disorders of teeth and supporting structures: Secondary | ICD-10-CM

## 2015-11-05 DIAGNOSIS — K0530 Chronic periodontitis, unspecified: Secondary | ICD-10-CM

## 2015-11-05 DIAGNOSIS — K045 Chronic apical periodontitis: Secondary | ICD-10-CM

## 2015-11-05 DIAGNOSIS — I340 Nonrheumatic mitral (valve) insufficiency: Secondary | ICD-10-CM

## 2015-11-05 DIAGNOSIS — IMO0002 Reserved for concepts with insufficient information to code with codable children: Secondary | ICD-10-CM

## 2015-11-05 DIAGNOSIS — M264 Malocclusion, unspecified: Secondary | ICD-10-CM

## 2015-11-05 DIAGNOSIS — K083 Retained dental root: Secondary | ICD-10-CM

## 2015-11-05 DIAGNOSIS — K08409 Partial loss of teeth, unspecified cause, unspecified class: Secondary | ICD-10-CM

## 2015-11-05 DIAGNOSIS — Z01818 Encounter for other preprocedural examination: Secondary | ICD-10-CM

## 2015-11-05 DIAGNOSIS — K036 Deposits [accretions] on teeth: Secondary | ICD-10-CM

## 2015-11-05 DIAGNOSIS — K0401 Reversible pulpitis: Secondary | ICD-10-CM

## 2015-11-05 DIAGNOSIS — K029 Dental caries, unspecified: Secondary | ICD-10-CM

## 2015-11-05 NOTE — Progress Notes (Signed)
DENTAL CONSULTATION  Date of Consultation:  11/05/2015 Patient Name:   Oscar Brown Date of Birth:   22-Apr-1966 Medical Record Number: [id number]  VITALS: BP 112/61 mmHg  Pulse 56  Temp(Src) 98.6 F (37 C) (Oral)  CHIEF COMPLAINT: Patient was referred by Dr. Roxy Manns for a dental consultation.  HPI: Oscar Brown is a 49 year old male recently diagnosed with severe mitral regurgitation. Patient with anticipated mitral valve repair or replacement as indicated with Dr. Roxy Manns. Patient now seen as a medically necessary pre-heart valve surgery dental protocol examination.  Patient has a history of upper left quadrant toothaches. Patient describes the pain as being sharp and last minutes to hours at a time. When it hurts it hurts at a 10 out of 10 in intensity. Patient indicates the pain is currently 3 out of 10. Patient indicates that it primarily occurs with brushing and chewing in the area. Patient points to the upper left molar area #15 and 16. Patient denies spontaneous problems.  The patient last saw a dentist approximately 1-2 years ago to have a tooth extracted.  Patient denies any complications from that dental extraction. Patient indicates that the extraction was performed by an oral surgeon on [employer]. Patient denies having a regular primary dentist.  PROBLEM LIST: Patient Active Problem List   Diagnosis Date Noted  . Severe mitral regurgitation 10/06/2012    Priority: High  . Chronic diastolic heart failure ([place])   . Dyspnea 09/22/2015  . Shortness of breath 09/21/2015  . Acute on chronic diastolic heart failure ([place]) 09/21/2015  . Thrombocytopenia ([place]) 06/05/2015  . Tobacco use disorder 06/05/2015  . Cellulitis of right thigh 05/29/2015  . Pain and swelling of right lower leg 05/29/2015  . Open wound of second toe of right foot 05/29/2015  . Preventative health care 10/06/2012  . Obesity, Class II, BMI 35-39.9 12/14/2006  . Obstructive sleep apnea 12/14/2006  .  Chronic venous insufficiency 12/14/2006    PMH: Past Medical History  Diagnosis Date  . 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  Complicated by chronic left lower extremity edema and occasional recurrent left lower extremity cellulitis   . Obstructive sleep apnea 12/14/2006    AHI 19.2/hr, desaturation to 75% on Polysomnography 02/26/2005.  Uses 11 cm H2O nocturnal nasal CPAP with good symptom control.   . Obesity, Class II, BMI 35-39.9 12/14/2006  . Mitral regurgitation 10/06/2012  . Lymphedema   . Fatty liver   . Acute diastolic congestive heart failure ([place]) 09/21/2015  . DVT (deep venous thrombosis) ([place]) 1982    PSH: Past Surgical History  Procedure Laterality Date  . Tee without cardioversion N/A 10/16/2015    Procedure: TRANSESOPHAGEAL ECHOCARDIOGRAM (TEE);  Surgeon: Skeet Latch, MD;  Location: [facility];  Service: Cardiovascular;  Laterality: N/A;  . Cardiac catheterization N/A 11/03/2015    Procedure: Right/Left Heart Cath and Coronary Angiography;  Surgeon: Leonie Man, MD;  Location: [facility];  Service: Cardiovascular;  Laterality: N/A;    ALLERGIES: No Known Allergies  MEDICATIONS: Current Outpatient Prescriptions  Medication Sig Dispense Refill  . furosemide (LASIX) 20 MG tablet Take 1 tablet (20 mg total) by mouth daily. 30 tablet 3   No current facility-administered medications for this visit.    LABS: Lab Results  Component Value Date   WBC 5.6 10/27/2015   HGB 14.7 10/27/2015   HCT 42.4 10/27/2015   MCV 86.7 10/27/2015   PLT 188 10/27/2015  Component Value Date/Time   NA 135 10/27/2015 1544   NA 136 09/30/2015 1150   K 4.4 10/27/2015 1544   CL 102 10/27/2015 1544   CO2 29 10/27/2015 1544   GLUCOSE 80 10/27/2015 1544   GLUCOSE 82 09/30/2015 1150   BUN 17 10/27/2015 1544   BUN 21 09/30/2015 1150   CREATININE 0.95 10/27/2015 1544   CREATININE 0.89 09/30/2015 1150   CALCIUM 9.6 10/27/2015  1544   GFRNONAA 100 09/30/2015 1150   GFRAA 116 09/30/2015 1150   Lab Results  Component Value Date   INR 1.02 10/27/2015   INR 0.95 10/09/2015   No results found for: PTT  SOCIAL HISTORY: Social History   Social History  . Marital Status: Legally Separated    Spouse Name: N/A  . Number of Children: 3  . Years of Education: N/A   Occupational History  . Not on file.   Social History Main Topics  . Smoking status: Former Smoker -- 0.10 packs/day for 9 years    Types: Cigarettes    Quit date: 09/05/2012  . Smokeless tobacco: Never Used  . Alcohol Use: No  . Drug Use: No  . Sexual Activity: Not on file   Other Topics Concern  . Not on file   Social History Narrative   FAMILY HISTORY: History reviewed. No pertinent family history. Mother passed away at age 31 from cancer. Father passed away at age 80 for complications of a back surgery.  REVIEW OF SYSTEMS:  General:normal appetite, normal energy, no weight gain, no weight loss, no fever Cardiac:no chest pain with exertion, no chest pain at rest, + SOB with exertion, no resting SOB, no PND, + orthopnea, no palpitations, no arrhythmia, no atrial fibrillation, + LE edema, + dizzy spells, no syncope Respiratory:+ shortness of breath, no home oxygen, no productive cough, + dry cough, no bronchitis, no wheezing, no hemoptysis, no asthma, no pain with inspiration or cough, + sleep apnea, + CPAP at night GI:no difficulty swallowing, no reflux, no frequent heartburn, no hiatal hernia, no abdominal pain, no constipation, no diarrhea, no hematochezia, no hematemesis, no melena GU:no dysuria, no frequency, no urinary tract infection, no hematuria, no enlarged prostate, no kidney stones, no kidney disease Vascular:no pain suggestive of claudication, no pain in feet, no leg cramps, no varicose veins, + History of DVT, no non-healing foot ulcer Neuro: no stroke, no TIA's, no seizures, no headaches, no slurred speech, no  peripheral neuropathy, no chronic pain, no instability of gait, no memory/cognitive dysfunction Musculoskeletal:no arthritis, no joint swelling, no myalgias, no difficulty walking, normal mobility  Skin:no rash, no itching, no skin infections, no pressure sores or ulcerations Psych:no anxiety, no depression, no nervousness, no unusual recent stress Eyes:occasional blurry vision, no recent vision changes, does not wears glasses or contacts ENT:no hearing loss, + toothaches, last saw dentist 2 years ago for extraction Hematologic:+ History of thrombocytopenia, no easy bruising, no abnormal bleeding, no clotting disorder, no frequent epistaxis Endocrine:no diabetes, no thyroid problems  DENTAL HISTORY: CHIEF COMPLAINT: Patient was referred by Dr. Roxy Manns for a dental consultation.  HPI: Oscar Brown is a 49 year old male recently diagnosed with severe mitral regurgitation. Patient with anticipated mitral valve repair or replacement as indicated with Dr. Roxy Manns. Patient now seen as a medically necessary pre-heart valve surgery dental protocol examination.  Patient has a history of upper left quadrant toothaches. Patient describes the pain as being sharp and last minutes to hours at a time. When it hurts it hurts at  a 10 out of 10 in intensity. Patient indicates the pain is currently 3 out of 10. Patient indicates that it primarily occurs with brushing and chewing in the area. Patient points to the upper left molar area #15 and 16. Patient denies spontaneous problems.  The patient last saw a dentist approximately 1-2 years ago to have a tooth extracted.  Patient denies any complications from that dental extraction. Patient indicates that the extraction was performed by an oral surgeon on [employer]. Patient denies having a regular primary dentist.  DENTAL EXAMINATION: GENERAL: The patient is a well-developed, well-nourished male in no acute distress. HEAD AND NECK: There is no palpable  submandibular lymphadenopathy. The patient denies acute TMJ symptoms. Patient has a maximum interincisal opening of 42 mm INTRAORAL EXAM: The patient has normal saliva. I do not see any evidence of oral abscess formation. Cancer exam was negative. DENTITION: Patient is missing tooth #4, #19, and #31. There are retained root segments in the area of tooth numbers 15 and 16. PERIODONTAL: Patient has chronic periodontitis with plaque and calculus accumulations, gingival recession, and tooth mobility as charted. There is incipient to moderate bone loss noted. Periodontal charting is performed and is in the dental chart. DENTAL CARIES/SUBOPTIMAL RESTORATIONS: There are multiple dental caries as noted per dental charting form. Patient has multiple abfraction lesions.  ENDODONTIC: Patient has a history of acute pulpitis symptoms. Patient has multiple areas of chronic apical periodontitis and radiolucency. CROWN AND BRIDGE: There are no crown or bridge restorations. PROSTHODONTIC: Patient denies having partial dentures. OCCLUSION: The patient has a poor occlusal scheme secondary to multiple missing teeth, multiple retained root segments, significant occlusal and incisal attrition, and an end-to-end occlusion.  RADIOGRAPHIC INTERPRETATION: An orthopantogram was taken and supplemented with a full series of dental radiographs. There are multiple missing teeth. There are multiple retained root segments. There are multiple dental caries noted. There are multiple areas of periapical pathology and radiolucency. There is supra-eruption and drifting of the unopposed teeth into the edentulous areas.  ASSESSMENTS: 1. Severe mitral regurgitation 2. Pre-heart valve surgery dental protocol 3. History of acute pulpitis 4. Chronic apical periodontitis 5. Multiple retained root segments 6. Multiple dental caries 7. Multiple flexure lesions 8. Chronic periodontitis with bone loss 9. Gingival recession 10.  Accretions 11. Tooth mobility 12. Multiple missing teeth 13. Supra-eruption and drifting of the unopposed teeth into the edentulous areas 14. Generalized occlusal and incisal attrition 15. 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 Risk for complications up to and including death with anticipated invasive dental procedures due to severe cardiovascular compromise.   PLAN/RECOMMENDATIONS: 1. I discussed the risks, benefits, and complications of various treatment options with the patient in relationship to his medical and dental conditions, dysphagia heart valve surgery, and risk for endocarditis. We discussed various treatment options to include no treatment, multiple extractions with alveoloplasty, pre-prosthetic surgery as indicated, periodontal therapy, dental restorations, root canal therapy, crown and bridge therapy, implant therapy, and replacement of missing teeth as indicated. The patient currently wishes to proceed with multiple extractions with alveoloplasty and gross debridement of remaining dentition in the operating room with general anesthesia on this Friday, 11/07/2015 at [facility]. The patient will then follow with heart valve surgery with Dr. Roxy Manns per his discretion. Patient will also need to follow-up with a primary dentist for regular periodontal maintenance and other dental restorations and treatment as indicated once he is medically stable from the anticipated heart valve surgery.  2. Discussion of findings with medical  team and coordination of future medical and dental care as needed.  I spent in excess of  120 minutes during the conduct of this consultation and >50% of this time involved direct face-to-face encounter for counseling and/or coordination of the patient's care.    [name], DDS

## 2015-11-05 NOTE — Patient Instructions (Signed)
[place]    Department of Dental Medicine     DR. KULINSKI      HEART VALVES AND MOUTH CARE:  FACTS:   If you have any infection in your mouth, it can infect your heart valve.  If you heart valve is infected, you will be seriously ill.  Infections in the mouth can be SILENT and do not always cause pain.  Examples of infections in the mouth are gum disease, dental cavities, and abscesses.  Some possible signs of infection are: Bad breath, bleeding gums, or teeth that are sensitive to sweets, hot, and/or cold. There are many other signs as well.  WHAT YOU HAVE TO DO:   Brush your teeth after meals and at bedtime. Spend at least 2 minutes brushing well, especially behind your back teeth and all around your teeth that stand alone. Brush at the gumline also.  Do not go to bed without brushing your teeth and flossing.  If you gums bleed when you brush or floss, do NOT stop brushing or flossing. It usually means that your gums need more attention and better cleaning.   If your Dentist or Dr. Kulinski gave you a prescription mouthwash to use, make sure to use it as directed. If you run out of the medication, get a refill at the pharmacy.   If you were given any other medications or directions by your Dentist, please follow them. If you did not understand the directions or forget what you were told, please call. We will be happy to refresh her memory.  If you need antibiotics before dental procedures, make sure you take them one hour prior to every dental visit as directed.   Get a dental checkup every 4-6 months in order to keep your mouth healthy, or to find and treat any new infection. You will most likely need your teeth cleaned or gums treated at the same time.  If you are not able to come in for your scheduled appointment, call your Dentist as soon as possible to reschedule.  If you have a problem in between dental visits, call your Dentist.  

## 2015-11-06 ENCOUNTER — Encounter (HOSPITAL_COMMUNITY): Payer: Self-pay | Admitting: *Deleted

## 2015-11-06 ENCOUNTER — Other Ambulatory Visit: Payer: Self-pay | Admitting: Thoracic Surgery (Cardiothoracic Vascular Surgery)

## 2015-11-06 ENCOUNTER — Ambulatory Visit
Admission: RE | Admit: 2015-11-06 | Discharge: 2015-11-06 | Disposition: A | Discharge status: Home / Self Care | Payer: Managed Care, Other (non HMO) | Source: Ambulatory Visit | Attending: Thoracic Surgery (Cardiothoracic Vascular Surgery) | Admitting: Thoracic Surgery (Cardiothoracic Vascular Surgery)

## 2015-11-06 DIAGNOSIS — I719 Aortic aneurysm of unspecified site, without rupture: Secondary | ICD-10-CM

## 2015-11-06 DIAGNOSIS — I7409 Other arterial embolism and thrombosis of abdominal aorta: Secondary | ICD-10-CM

## 2015-11-06 MED ORDER — CEFAZOLIN SODIUM-DEXTROSE 2-3 GM-% IV SOLR
2.0000 g | INTRAVENOUS | Status: AC
  Administered 2015-11-07: 2 g via INTRAVENOUS
  Filled 2015-11-06: qty 50

## 2015-11-06 NOTE — Progress Notes (Signed)
Pt denies any chest pain or sob. Pt has severe mitral regurgitation and is scheduled for Mitral valve repair/replacement next week. Coming in tomorrow for dental surgery.   EKG - 10/09/15 in EPIC  Echo - 10/16/15 in EPIC  Cath - 11/03/15  Made Dr. Kalman Shan (anesthesiologist) aware that pt is a same day workup pt, he states they will evaluate him tomorrow.

## 2015-11-07 ENCOUNTER — Ambulatory Visit (HOSPITAL_COMMUNITY)
Admission: RE | Admit: 2015-11-07 | Discharge: 2015-11-07 | Disposition: A | Discharge status: Home / Self Care | Payer: Managed Care, Other (non HMO) | Source: Ambulatory Visit | Attending: Dentistry | Admitting: Dentistry

## 2015-11-07 ENCOUNTER — Ambulatory Visit (HOSPITAL_COMMUNITY): Payer: Managed Care, Other (non HMO) | Admitting: Certified Registered Nurse Anesthetist

## 2015-11-07 ENCOUNTER — Encounter (HOSPITAL_COMMUNITY): Payer: Self-pay | Admitting: Certified Registered Nurse Anesthetist

## 2015-11-07 ENCOUNTER — Inpatient Hospital Stay: Admission: RE | Admit: 2015-11-07 | Payer: Self-pay | Source: Ambulatory Visit

## 2015-11-07 ENCOUNTER — Other Ambulatory Visit: Payer: Self-pay

## 2015-11-07 ENCOUNTER — Encounter (HOSPITAL_COMMUNITY)
Admission: RE | Disposition: A | Discharge status: Home / Self Care | Payer: Self-pay | Source: Ambulatory Visit | Attending: Dentistry

## 2015-11-07 DIAGNOSIS — I509 Heart failure, unspecified: Secondary | ICD-10-CM | POA: Insufficient documentation

## 2015-11-07 DIAGNOSIS — E669 Obesity, unspecified: Secondary | ICD-10-CM | POA: Insufficient documentation

## 2015-11-07 DIAGNOSIS — G4733 Obstructive sleep apnea (adult) (pediatric): Secondary | ICD-10-CM | POA: Diagnosis not present

## 2015-11-07 DIAGNOSIS — K045 Chronic apical periodontitis: Secondary | ICD-10-CM | POA: Diagnosis not present

## 2015-11-07 DIAGNOSIS — K0401 Reversible pulpitis: Secondary | ICD-10-CM

## 2015-11-07 DIAGNOSIS — Z6833 Body mass index (BMI) 33.0-33.9, adult: Secondary | ICD-10-CM | POA: Insufficient documentation

## 2015-11-07 DIAGNOSIS — Z79899 Other long term (current) drug therapy: Secondary | ICD-10-CM | POA: Insufficient documentation

## 2015-11-07 DIAGNOSIS — K0530 Chronic periodontitis, unspecified: Secondary | ICD-10-CM | POA: Diagnosis not present

## 2015-11-07 DIAGNOSIS — Z87891 Personal history of nicotine dependence: Secondary | ICD-10-CM | POA: Diagnosis not present

## 2015-11-07 DIAGNOSIS — K036 Deposits [accretions] on teeth: Secondary | ICD-10-CM | POA: Insufficient documentation

## 2015-11-07 DIAGNOSIS — I340 Nonrheumatic mitral (valve) insufficiency: Secondary | ICD-10-CM | POA: Diagnosis not present

## 2015-11-07 DIAGNOSIS — I890 Lymphedema, not elsewhere classified: Secondary | ICD-10-CM | POA: Diagnosis not present

## 2015-11-07 DIAGNOSIS — K083 Retained dental root: Secondary | ICD-10-CM

## 2015-11-07 DIAGNOSIS — K029 Dental caries, unspecified: Secondary | ICD-10-CM

## 2015-11-07 DIAGNOSIS — Z86718 Personal history of other venous thrombosis and embolism: Secondary | ICD-10-CM | POA: Diagnosis not present

## 2015-11-07 DIAGNOSIS — I2510 Atherosclerotic heart disease of native coronary artery without angina pectoris: Secondary | ICD-10-CM | POA: Insufficient documentation

## 2015-11-07 DIAGNOSIS — M264 Malocclusion, unspecified: Secondary | ICD-10-CM | POA: Insufficient documentation

## 2015-11-07 DIAGNOSIS — K0889 Other specified disorders of teeth and supporting structures: Secondary | ICD-10-CM | POA: Insufficient documentation

## 2015-11-07 HISTORY — PX: MULTIPLE EXTRACTIONS WITH ALVEOLOPLASTY: SHX5342

## 2015-11-07 LAB — BASIC METABOLIC PANEL WITH GFR
Anion gap: 4 — ABNORMAL LOW (ref 5–15)
BUN: 9 mg/dL (ref 6–20)
CO2: 28 mmol/L (ref 22–32)
Calcium: 9.1 mg/dL (ref 8.9–10.3)
Chloride: 106 mmol/L (ref 101–111)
Creatinine, Ser: 0.83 mg/dL (ref 0.61–1.24)
GFR calc Af Amer: >60 mL/min
GFR calc non Af Amer: >60 mL/min
Glucose, Bld: 87 mg/dL (ref 65–99)
Potassium: 4.4 mmol/L (ref 3.5–5.1)
Sodium: 138 mmol/L (ref 135–145)

## 2015-11-07 LAB — HEMOGLOBIN: Hemoglobin: 13.7 g/dL (ref 13.0–17.0)

## 2015-11-07 SURGERY — MULTIPLE EXTRACTION WITH ALVEOLOPLASTY
Anesthesia: General | Site: Mouth

## 2015-11-07 MED ORDER — LIDOCAINE-EPINEPHRINE 2 %-1:100000 IJ SOLN
INTRAMUSCULAR | Status: DC | PRN
  Administered 2015-11-07 (×5): 1.7 mL via INTRADERMAL

## 2015-11-07 MED ORDER — LACTATED RINGERS IV SOLN: INTRAVENOUS | Status: DC

## 2015-11-07 MED ORDER — HYDROMORPHONE HCL 1 MG/ML IJ SOLN
0.2500 mg | INTRAMUSCULAR | Status: DC | PRN
  Administered 2015-11-07: 0.25 mg via INTRAVENOUS

## 2015-11-07 MED ORDER — LIDOCAINE-EPINEPHRINE 2 %-1:100000 IJ SOLN
INTRAMUSCULAR | Status: AC
  Filled 2015-11-07: qty 10.2

## 2015-11-07 MED ORDER — HYDROMORPHONE HCL 1 MG/ML IJ SOLN
INTRAMUSCULAR | Status: AC
  Administered 2015-11-07: 0.25 mg via INTRAVENOUS
  Filled 2015-11-07: qty 1

## 2015-11-07 MED ORDER — OXYCODONE-ACETAMINOPHEN 5-325 MG PO TABS: 1.0000 | ORAL_TABLET | ORAL | Status: DC | PRN

## 2015-11-07 MED ORDER — MIDAZOLAM HCL 2 MG/2ML IJ SOLN
INTRAMUSCULAR | Status: AC
  Filled 2015-11-07: qty 2

## 2015-11-07 MED ORDER — ONDANSETRON HCL 4 MG/2ML IJ SOLN
INTRAMUSCULAR | Status: AC
  Filled 2015-11-07: qty 2

## 2015-11-07 MED ORDER — PROPOFOL 10 MG/ML IV BOLUS
INTRAVENOUS | Status: AC
  Filled 2015-11-07: qty 20

## 2015-11-07 MED ORDER — FENTANYL CITRATE (PF) 100 MCG/2ML IJ SOLN
INTRAMUSCULAR | Status: DC | PRN
  Administered 2015-11-07 (×2): 25 ug via INTRAVENOUS
  Administered 2015-11-07: 50 ug via INTRAVENOUS
  Administered 2015-11-07: 100 ug via INTRAVENOUS

## 2015-11-07 MED ORDER — PHENYLEPHRINE HCL 10 MG/ML IJ SOLN
INTRAMUSCULAR | Status: DC | PRN
  Administered 2015-11-07: 80 ug via INTRAVENOUS

## 2015-11-07 MED ORDER — FENTANYL CITRATE (PF) 250 MCG/5ML IJ SOLN
INTRAMUSCULAR | Status: AC
  Filled 2015-11-07: qty 5

## 2015-11-07 MED ORDER — ACETAMINOPHEN 325 MG PO TABS: 325.0000 mg | ORAL_TABLET | ORAL | Status: DC | PRN

## 2015-11-07 MED ORDER — LIDOCAINE HCL (CARDIAC) 20 MG/ML IV SOLN
INTRAVENOUS | Status: AC
  Filled 2015-11-07: qty 5

## 2015-11-07 MED ORDER — LACTATED RINGERS IV SOLN
INTRAVENOUS | Status: DC | PRN
  Administered 2015-11-07 (×2): via INTRAVENOUS

## 2015-11-07 MED ORDER — DEXAMETHASONE SODIUM PHOSPHATE 4 MG/ML IJ SOLN
INTRAMUSCULAR | Status: DC | PRN
  Administered 2015-11-07: 10 mg via INTRAVENOUS

## 2015-11-07 MED ORDER — EPHEDRINE SULFATE 50 MG/ML IJ SOLN
INTRAMUSCULAR | Status: DC | PRN
  Administered 2015-11-07 (×4): 10 mg via INTRAVENOUS

## 2015-11-07 MED ORDER — LIDOCAINE HCL (CARDIAC) 20 MG/ML IV SOLN
INTRAVENOUS | Status: DC | PRN
  Administered 2015-11-07: 70 mg via INTRAVENOUS

## 2015-11-07 MED ORDER — BUPIVACAINE-EPINEPHRINE (PF) 0.5% -1:200000 IJ SOLN
INTRAMUSCULAR | Status: AC
  Filled 2015-11-07: qty 3.6

## 2015-11-07 MED ORDER — HEMOSTATIC AGENTS (NO CHARGE) OPTIME
TOPICAL | Status: DC | PRN
  Administered 2015-11-07: 1 via TOPICAL

## 2015-11-07 MED ORDER — OXYCODONE HCL 5 MG PO TABS: 5.0000 mg | ORAL_TABLET | Freq: Once | ORAL | Status: DC | PRN

## 2015-11-07 MED ORDER — LACTATED RINGERS IV SOLN
INTRAVENOUS | Status: DC
  Administered 2015-11-07: 09:00:00 via INTRAVENOUS

## 2015-11-07 MED ORDER — MIDAZOLAM HCL 5 MG/5ML IJ SOLN
INTRAMUSCULAR | Status: DC | PRN
  Administered 2015-11-07: 2 mg via INTRAVENOUS

## 2015-11-07 MED ORDER — DEXTROSE 5 % IV SOLN
10.0000 mg | INTRAVENOUS | Status: DC | PRN
  Administered 2015-11-07: 10 ug/min via INTRAVENOUS

## 2015-11-07 MED ORDER — OXYCODONE-ACETAMINOPHEN 5-325 MG PO TABS: ORAL_TABLET | ORAL | Status: DC

## 2015-11-07 MED ORDER — 0.9 % SODIUM CHLORIDE (POUR BTL) OPTIME
TOPICAL | Status: DC | PRN
  Administered 2015-11-07: 1000 mL

## 2015-11-07 MED ORDER — ACETAMINOPHEN 160 MG/5ML PO SOLN
325.0000 mg | ORAL | Status: DC | PRN
  Filled 2015-11-07: qty 20.3

## 2015-11-07 MED ORDER — OXYCODONE HCL 5 MG/5ML PO SOLN: 5.0000 mg | Freq: Once | ORAL | Status: DC | PRN

## 2015-11-07 MED ORDER — SUCCINYLCHOLINE CHLORIDE 20 MG/ML IJ SOLN
INTRAMUSCULAR | Status: DC | PRN
  Administered 2015-11-07: 40 mg via INTRAVENOUS
  Administered 2015-11-07: 60 mg via INTRAVENOUS

## 2015-11-07 MED ORDER — PROPOFOL 10 MG/ML IV BOLUS
INTRAVENOUS | Status: DC | PRN
  Administered 2015-11-07: 40 mg via INTRAVENOUS
  Administered 2015-11-07: 30 mg via INTRAVENOUS
  Administered 2015-11-07: 130 mg via INTRAVENOUS

## 2015-11-07 MED ORDER — ONDANSETRON HCL 4 MG/2ML IJ SOLN
INTRAMUSCULAR | Status: DC | PRN
  Administered 2015-11-07: 4 mg via INTRAVENOUS

## 2015-11-07 MED ORDER — BUPIVACAINE-EPINEPHRINE 0.5% -1:200000 IJ SOLN
INTRAMUSCULAR | Status: DC | PRN
  Administered 2015-11-07 (×2): 1.8 mL

## 2015-11-07 SURGICAL SUPPLY — 35 items
ALCOHOL 70% 16 OZ (MISCELLANEOUS) ×3 IMPLANT
ATTRACTOMAT 16X20 MAGNETIC DRP (DRAPES) ×3 IMPLANT
BLADE SURG 15 STRL LF DISP TIS (BLADE) ×2 IMPLANT
BLADE SURG 15 STRL SS (BLADE) ×6
COVER SURGICAL LIGHT HANDLE (MISCELLANEOUS) ×3 IMPLANT
GAUZE PACKING FOLDED 2  STR (GAUZE/BANDAGES/DRESSINGS) ×2
GAUZE PACKING FOLDED 2 STR (GAUZE/BANDAGES/DRESSINGS) ×1 IMPLANT
GAUZE SPONGE 4X4 16PLY XRAY LF (GAUZE/BANDAGES/DRESSINGS) ×3 IMPLANT
GLOVE BIOGEL PI IND STRL 6 (GLOVE) ×1 IMPLANT
GLOVE BIOGEL PI INDICATOR 6 (GLOVE) ×2
GLOVE SURG ORTHO 8.0 STRL STRW (GLOVE) ×3 IMPLANT
GLOVE SURG SS PI 6.0 STRL IVOR (GLOVE) ×3 IMPLANT
GOWN STRL REUS W/ TWL LRG LVL3 (GOWN DISPOSABLE) ×1 IMPLANT
GOWN STRL REUS W/TWL 2XL LVL3 (GOWN DISPOSABLE) ×3 IMPLANT
GOWN STRL REUS W/TWL LRG LVL3 (GOWN DISPOSABLE) ×3
HEMOSTAT SURGICEL 2X14 (HEMOSTASIS) ×3 IMPLANT
KIT BASIN OR (CUSTOM PROCEDURE TRAY) ×3 IMPLANT
KIT ROOM TURNOVER OR (KITS) ×3 IMPLANT
MANIFOLD NEPTUNE WASTE (CANNULA) ×3 IMPLANT
NDL BLUNT 16X1.5 OR ONLY (NEEDLE) ×1 IMPLANT
NEEDLE BLUNT 16X1.5 OR ONLY (NEEDLE) ×3 IMPLANT
NS IRRIG 1000ML POUR BTL (IV SOLUTION) ×3 IMPLANT
PACK EENT II TURBAN DRAPE (CUSTOM PROCEDURE TRAY) ×3 IMPLANT
PAD ARMBOARD 7.5X6 YLW CONV (MISCELLANEOUS) ×5 IMPLANT
SPONGE SURGIFOAM ABS GEL 100 (HEMOSTASIS) ×2 IMPLANT
SPONGE SURGIFOAM ABS GEL 12-7 (HEMOSTASIS) IMPLANT
SPONGE SURGIFOAM ABS GEL SZ50 (HEMOSTASIS) IMPLANT
SUCTION FRAZIER TIP 10 FR DISP (SUCTIONS) ×3 IMPLANT
SUT CHROMIC 3 0 PS 2 (SUTURE) ×10 IMPLANT
SUT CHROMIC 4 0 P 3 18 (SUTURE) IMPLANT
SYR 50ML SLIP (SYRINGE) ×3 IMPLANT
TOWEL OR 17X26 10 PK STRL BLUE (TOWEL DISPOSABLE) ×3 IMPLANT
TUBE CONNECTING 12'X1/4 (SUCTIONS) ×1
TUBE CONNECTING 12X1/4 (SUCTIONS) ×2 IMPLANT
YANKAUER SUCT BULB TIP NO VENT (SUCTIONS) ×3 IMPLANT

## 2015-11-07 NOTE — Anesthesia Preprocedure Evaluation (Addendum)
Anesthesia Evaluation  Patient identified by MRN, date of birth, ID band Patient awake    Reviewed: Allergy & Precautions, NPO status , Patient's Chart, lab work & pertinent test results  History of Anesthesia Complications Negative for: history of anesthetic complications  Airway Mallampati: II  TM Distance: >3 FB Neck ROM: Full    Dental  (+) Teeth Intact, Dental Advisory Given   Pulmonary shortness of breath, sleep apnea , former smoker,    breath sounds clear to auscultation       Cardiovascular + Peripheral Vascular Disease and +CHF  + Valvular Problems/Murmurs MR  Rhythm:Regular + Systolic murmurs    Neuro/Psych negative neurological ROS  negative psych ROS   GI/Hepatic negative GI ROS, Neg liver ROS,   Endo/Other  Morbid obesity  Renal/GU negative Renal ROS     Musculoskeletal negative musculoskeletal ROS (+)   Abdominal   Peds  Hematology negative hematology ROS (+)   Anesthesia Other Findings   Reproductive/Obstetrics                           Anesthesia Physical Anesthesia Plan  ASA: III  Anesthesia Plan: General   Post-op Pain Management:    Induction: Intravenous  Airway Management Planned: Oral ETT  Additional Equipment: None  Intra-op Plan:   Post-operative Plan: Extubation in OR  Informed Consent: I have reviewed the patients History and Physical, chart, labs and discussed the procedure including the risks, benefits and alternatives for the proposed anesthesia with the patient or authorized representative who has indicated his/her understanding and acceptance.   Dental advisory given  Plan Discussed with: CRNA and Surgeon  Anesthesia Plan Comments:         Anesthesia Quick Evaluation

## 2015-11-07 NOTE — Anesthesia Procedure Notes (Signed)
Procedure Name: Intubation Date/Time: 11/07/2015 10:16 AM Performed by: Ollen Bowl Pre-anesthesia Checklist: Patient identified, Timeout performed, Emergency Drugs available, Suction available and Patient being monitored Patient Re-evaluated:Patient Re-evaluated prior to inductionOxygen Delivery Method: Circle system utilized and Simple face mask Preoxygenation: Pre-oxygenation with 100% oxygen Intubation Type: IV induction Ventilation: Mask ventilation without difficulty Laryngoscope Size: Mac and 4 Grade View: Grade II Nasal Tubes: Right, Magill forceps- large, utilized, Nasal prep performed and Nasal Rae Tube size: 7.5 mm Number of attempts: 2 Airway Equipment and Method: Patient positioned with wedge pillow Placement Confirmation: ETT inserted through vocal cords under direct vision,  positive ETCO2 and breath sounds checked- equal and bilateral Tube secured with: Tape Dental Injury: Teeth and Oropharynx as per pre-operative assessment

## 2015-11-07 NOTE — H&P (Signed)
11/07/2015  Patient:            Oscar Brown Date of Birth:  05/28/1966 MRN:                [id number]   BP 110/73 mmHg  Pulse 56  Temp(Src) 99.1 F (37.3 C) (Oral)  Resp 16  Wt 217 lb (98.431 kg)  SpO2 100%   Oscar Brown is a 49 year old male that presents for multiple dental extractions with alveoloplasty and gross debridement of remaining dentition the operative room general anesthesia. The patient denies any acute medical or dental changes. Please use consult note from Dr. Roxy Brown on 11/04/2015 to act as the H&P for the dental operating room procedure.  Oscar Brown, DDS   CARDIOTHORACIC SURGERY CONSULTATION REPORT  Referring Provider is Oscar Latch, MD PCP is Oscar Cobble, MD  Chief Complaint  Patient presents with  . Mitral Regurgitation    eval for MVR.Marland KitchenECHO 09/22/15.Marland KitchenMarland KitchenCATH 11/03/15    HPI:  Patient is a 49 year old moderately obese African-American male with OSA, lymphedema and remote h/o DVT who is referred for surgical consultation of recently discovered severe symptomatic mitral regurgitation. Patient states that he was first noted to have a heart murmur on physical exam to her 3 years ago by his primary care physician. He has remained physically active and asymptomatic until recently. In October he developed fairly rapid onset symptoms of exertional shortness of breath and orthopnea with mild lower extremity edema. Symptoms progressed until he eventually presented to the hospital where he was hospitalized with acute diastolic congestive heart failure. Transthoracic echocardiogram revealed mitral regurgitation with normal left ventricular systolic function. Ejection fraction was estimated 55-60%. Visually the mitral regurgitation was felt to be moderate to severe, but calculations suggested the presence of only moderate mitral regurgitation with ERO measured 0.19 corresponding to regurgitant volume estimated 39 mL. The patient responded well  to diuretic therapy and was seen in follow-up by Oscar Brown who performed TEE on 10/16/2015 revealed thickened mitral valve leaflets with severely restricted posterior leaflet and severe mitral regurgitation. ERO measured 0.48 corresponding to a regurgitant volume estimated 91 mL and regurgitant fraction estimated 60%. Left ventricular size and systolic function appeared normal. The aortic valve was normal. Right ventricular size and systolic function was normal. There was trivial tricuspid regurgitation. The patient subsequently underwent left and right heart catheterization on 11/03/2015. The patient was found to have mild nonobstructive coronary artery disease and normal pulmonary artery pressures. The patient was referred for elective surgical consultation.  The patient is separated from his wife and lives locally in [place]. He has 2 adult children and one 81 year old son. 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 He has been physically active all of his life. He reports no significant physical limitations. He states that up until recently has not experience any significant symptoms of exertional shortness of breath. He states that the symptoms progressed fairly rapidly in October, ultimately causing him to present to the hospital as previously stated. Since hospital discharge the patient has done well on medical therapy. He denies any history of exertional chest pain or chest tightness. He had severe exertional shortness of breath, orthopnea, and a dry non-productive cough at the time of his recent hospital admission. The symptoms have improved. He has had one dizzy spell without any history of syncope. He denies any history of palpitations.    Past Medical History  Diagnosis Date  .  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 Complicated by chronic left lower extremity edema and  occasional recurrent left lower extremity cellulitis   . Obstructive sleep apnea 12/14/2006    AHI 19.2/hr, desaturation to 75% on Polysomnography 02/26/2005. Uses 11 cm H2O nocturnal nasal CPAP with good symptom control.   . Obesity, Class II, BMI 35-39.9 12/14/2006  . Mitral regurgitation 10/06/2012  . Lymphedema   . Fatty liver   . Acute diastolic congestive heart failure ([place]) 09/21/2015    Past Surgical History  Procedure Laterality Date  . Tee without cardioversion N/A 10/16/2015    Procedure: TRANSESOPHAGEAL ECHOCARDIOGRAM (TEE); Surgeon: Oscar Latch, MD; Location: [facility]; Service: Cardiovascular; Laterality: N/A;  . Cardiac catheterization N/A 11/03/2015    Procedure: Right/Left Heart Cath and Coronary Angiography; Surgeon: Oscar Man, MD; Location: [facility]; Service: Cardiovascular; Laterality: N/A;    No family history on file.  Social History   Social History  . Marital Status: Legally Separated    Spouse Name: N/A  . Number of Children: N/A  . Years of Education: N/A   Occupational History  . Not on file.   Social History Main Topics  . Smoking status: Former Smoker -- 0.10 packs/day for 9 years    Types: Cigarettes    Quit date: 09/05/2012  . Smokeless tobacco: Never Used  . Alcohol Use: No  . Drug Use: No  . Sexual Activity: Not on file   Other Topics Concern  . Not on file   Social History Narrative    Current Outpatient Prescriptions  Medication Sig Dispense Refill  . furosemide (LASIX) 20 MG tablet Take 1 tablet (20 mg total) by mouth daily. 30 tablet 3   No current facility-administered medications for this visit.    No Known Allergies    Review of Systems:  General:normal appetite, normal energy, no weight gain, no weight loss, no  fever Cardiac:no chest pain with exertion, no chest pain at rest, + SOB with exertion, no resting SOB, no PND, + orthopnea, no palpitations, no arrhythmia, no atrial fibrillation, + LE edema, + dizzy spells, no syncope Respiratory:+ shortness of breath, no home oxygen, no productive cough, + dry cough, no bronchitis, no wheezing, no hemoptysis, no asthma, no pain with inspiration or cough, + sleep apnea, + CPAP at night GI:no difficulty swallowing, no reflux, no frequent heartburn, no hiatal hernia, no abdominal pain, no constipation, no diarrhea, no hematochezia, no hematemesis, no melena GU:no dysuria, no frequency, no urinary tract infection, no hematuria, no enlarged prostate, no kidney stones, no kidney disease Vascular:no pain suggestive of claudication, no pain in feet, no leg cramps, no varicose veins, + DVT, no non-healing foot ulcer Neuro:no stroke, no TIA's, no seizures, no headaches, no temporary blindness one eye, no slurred speech, no peripheral neuropathy, no chronic pain, no instability of gait, no memory/cognitive dysfunction Musculoskeletal:no arthritis, no joint swelling, no myalgias, no difficulty walking, normao mobility  Skin:no rash, no itching, no skin infections, no pressure sores or ulcerations Psych:no anxiety, no depression, no nervousness, no unusual recent stress Eyes:occasional blurry vision, no floaters, no recent vision changes, does not wears glasses or contacts ENT:no hearing loss, no loose or painful teeth, no dentures, last saw dentist many years  ago Hematologic:no easy bruising, no abnormal bleeding, no clotting disorder, no frequent epistaxis Endocrine:no diabetes, does not check CBG's at home   Physical Exam:  BP 132/87 mmHg  Pulse 56  Resp 16  Ht  5' 8"  (1.727 m)  Wt 217 lb (98.431 kg)  BMI 33.00 kg/m2  SpO2 98% General:Moderately obese, o/w well-appearing HEENT:Unremarkable  Neck:no JVD, no bruits, no adenopathy  Chest:clear to auscultation, symmetrical breath sounds, no wheezes, no rhonchi  CV:RRR, grade III/VI holosystolic murmur  Abdomen:soft, non-tender, no masses  Extremities:warm, well-perfused, pulses diminished but palpable, + mild LE edema L>R Rectal/GUDeferred Neuro:Grossly non-focal and symmetrical throughout Skin:Clean and dry, no rashes, no breakdown   Diagnostic Tests:  Transthoracic Echocardiography  Patient:  Oscar, Brown MR #:    [id number] Study Date: 09/22/2015 Gender:   M Age:    29 Height:   172.7 cm Weight:   96.6 kg BSA:    2.18 m^2 Pt. Status: Room:    3E12C  SONOGRAPHER Diamond Nickel PERFORMING  [facility], Inpatient ADMITTING  Axel Filler ATTENDING  Lalla Brothers Halford Chessman, [facility], [facility]  cc:  ------------------------------------------------------------------- LV EF: 55% -  60%  ------------------------------------------------------------------- Indications:   Dyspnea  786.09.  ------------------------------------------------------------------- History:  PMH: Obstructive sleep apnea. Obesity. Mitral regurgitation. Lymphedema.  ------------------------------------------------------------------- Study Conclusions  - Left ventricle: The cavity size was normal. Systolic function was normal. The estimated ejection fraction was in the range of 55% to 60%. Wall motion was normal; there were no regional wall motion abnormalities. - Aortic valve: Moderate diffuse thickening and calcification, consistent with sclerosis. - Mitral valve: Mild diffuse calcification of the anterior leaflet. Visually there appears to be moderate to severe regurgitation directed eccentrically and posteriorly on the apical 4 chamber and 2 chamber views. The MR is calculated as mild to mdoerate using the ERO and MR volume. Consider TEE to assess degree of MR further. - Left atrium: The atrium was severely dilated. - Right ventricle: The cavity size was mildly dilated. Wall thickness was normal. - Pulmonary arteries: PA peak pressure: 35 mm Hg (S).  Impressions:  - The right ventricular systolic pressure was increased consistent with mild pulmonary hypertension.  Transthoracic echocardiography. M-mode, complete 2D, spectral Doppler, and color Doppler. Birthdate: Patient birthdate: 08/26/1966. Age: Patient is 49 yr old. Sex: Gender: male. BMI: 32.4 kg/m^2. Blood pressure:   132/80 Patient status: Inpatient. Study date: Study date: 09/22/2015. Study time: 10:45 AM. Location: Echo laboratory.  -------------------------------------------------------------------  ------------------------------------------------------------------- Left ventricle: The cavity size was normal. Systolic function was normal. The estimated ejection fraction was in the range of 55% to 60%. Wall motion was normal; there were no regional wall  motion abnormalities.  ------------------------------------------------------------------- Aortic valve:  Trileaflet. Moderate diffuse thickening and calcification, consistent with sclerosis. Mobility was not restricted. Doppler: Transvalvular velocity was within the normal range. There was no stenosis. There was no regurgitation.  ------------------------------------------------------------------- Aorta: Aortic root: The aortic root was normal in size.  ------------------------------------------------------------------- Mitral valve:  Mild diffuse calcification of the anterior leaflet. Mobility was not restricted. Doppler: Transvalvular velocity was within the normal range. There was no evidence for stenosis. Visually there appears to be moderate to severe regurgitation directed eccentrically and posteriorly on the apical 4 chamber and 2 chamber views. The MR is calculated as mild to mdoerate using the ERO and MR volume. Consider TEE to assess degree of MR further Peak gradient (D): 5 mm Hg.  ------------------------------------------------------------------- Left atrium: The atrium was severely dilated.  ------------------------------------------------------------------- Right ventricle: The cavity size was mildly dilated. Wall thickness was normal. Systolic function was normal.  ------------------------------------------------------------------- Pulmonic valve:  Structurally normal valve.  Cusp separation was normal. Doppler: Transvalvular velocity was within the normal  range. There was no evidence for stenosis. There was no regurgitation.  ------------------------------------------------------------------- Tricuspid valve:  Structurally normal valve.  Doppler: Transvalvular velocity was within the normal range. There was mild regurgitation.  ------------------------------------------------------------------- Pulmonary artery:  The main pulmonary artery  was normal-sized. Systolic pressure was within the normal range.  ------------------------------------------------------------------- Right atrium: The atrium was normal in size.  ------------------------------------------------------------------- Pericardium: There was no pericardial effusion.  ------------------------------------------------------------------- Systemic veins: Inferior vena cava: The vessel was normal in size.  ------------------------------------------------------------------- Measurements  Left ventricle             Value    Reference LV ID, ED, PLAX chordal        47.7 mm   43 - 52 LV ID, ES, PLAX chordal        35.4 mm   23 - 38 LV fx shortening, PLAX chordal (L)   26  %   >=29 LV PW thickness, ED          12.3 mm   --------- IVS/LV PW ratio, ED          0.77     <=1.3 LV e&', lateral             11.1 cm/s  --------- LV E/e&', lateral            9.91     --------- LV e&', medial             7.28 cm/s  --------- LV E/e&', medial            15.11    --------- LV e&', average             9.19 cm/s  --------- LV E/e&', average            11.97    ---------  Ventricular septum           Value    Reference IVS thickness, ED           9.43 mm   ---------  LVOT                  Value    Reference LVOT ID, S               20  mm   --------- LVOT area               3.14 cm^2  ---------  Aorta                 Value    Reference Aortic root ID, ED           34  mm   ---------  Left atrium              Value    Reference LA ID, A-P, ES             41  mm   --------- LA ID/bsa, A-P             1.88 cm/m^2 <=2.2 LA  volume, S              175  ml   --------- LA volume/bsa, S            80.1 ml/m^2 --------- LA volume, ES, 1-p A4C         181  ml   --------- LA volume/bsa, ES, 1-p A4C       82.8 ml/m^2 --------- LA volume, ES, 1-p A2C  172  ml   --------- LA volume/bsa, ES, 1-p A2C       78.7 ml/m^2 ---------  Mitral valve              Value    Reference Mitral E-wave peak velocity      110  cm/s  --------- Mitral A-wave peak velocity      108  cm/s  --------- Mitral deceleration time    (H)   532  ms   150 - 230 Mitral peak gradient, D        5   mm Hg --------- Mitral E/A ratio, peak         1      --------- Aliasing velocity, MR PISA       38.5 cm/s  --------- Mitral regurg PISA radius       7   mm   --------- Mitral regurg VTI, PISA        207  cm   --------- Mitral ERO, PISA            0.19 cm^2  --------- Mitral regurg volume, PISA       39  ml   ---------  Pulmonary arteries           Value    Reference PA pressure, S, DP       (H)   35  mm Hg <=30  Tricuspid valve            Value    Reference Tricuspid regurg peak velocity     281  cm/s  --------- Tricuspid peak RV-RA gradient     32  mm Hg ---------  Systemic veins             Value    Reference Estimated CVP             3   mm Hg ---------  Right ventricle            Value    Reference RV pressure, S, DP       (H)   35  mm Hg <=30 RV s&', lateral, S           15.8 cm/s  ---------  Legend: (L) and (H) mark values outside specified reference range.  ------------------------------------------------------------------- Prepared and Electronically Authenticated by  Fransico Him,  MD 2016-10-24T15:51:26    Transesophageal Echocardiography  Patient:  [place]scar, [place] MR #:    [id number] Study Date: 10/16/2015 Gender:   M Age:    49 Height:   172.7 cm Weight:   98.4 kg BSA:    2.21 m^2 Pt. Status: Room:  SONOGRAPHER Darlina Sicilian, RDCS ADMITTING  [place]atch, MD ATTENDING  Oscar Latch, MD ORDERING   Oscar Latch, MD PERFORMING  Oscar Latch, MD REFERRING  Oscar Latch, MD  cc:  -------------------------------------------------------------------  ------------------------------------------------------------------- Indications:   Mitral Valve Disorder 424.0/ I05.9.  ------------------------------------------------------------------- History:  PMH: Dyspnea/ Volume Overload. Sleep Apnea. Chronic Venous HTN due to DVT.  ------------------------------------------------------------------- Study Conclusions  - Left ventricle: Systolic function was normal. Wall motion was normal; there were no regional wall motion abnormalities. - Aortic valve: Valve area (VTI): 2.06 cm^2. Valve area (Vmax): 2.35 cm^2. Valve area (Vmean): 2.03 cm^2. - Mitral valve: Mobility of the posterior leaflet was restricted. There was severe regurgitation. Aliasing velocity of regurgitation (PISA): 34.3 cm/s. Radius of regurgitation (PISA): 11 mm. Effective regurgitant orifice (PISA): 0.48 cm^2. Regurgitant volume (PISA): 90.9 ml. - Left atrium: The atrium was dilated. No  evidence of thrombus in the atrial cavity or appendage. No evidence of thrombus in the atrial cavity or appendage. - Left upper pulmonary vein: There was systolic flow reversal. - Right ventricle: The cavity size was normal. Wall thickness was normal. Systolic function was normal. - Right atrium: No evidence of thrombus in the atrial cavity or appendage. - Atrial septum: No defect or patent foramen ovale was identified by  color flow Doppler. - Tricuspid valve: There was trivial regurgitation.  Impressions:  - Severe mitral regurgitation due to a tethered posterior mitral valve leaflet.  Diagnostic transesophageal echocardiography. 2D and color Doppler. Birthdate: Patient birthdate: 1966/01/18. Age: Patient is 49 yr old. Sex: Gender: male.  BMI: 33 kg/m^2. Blood pressure: 116/82 Patient status: Inpatient. Study date: Study date: 10/16/2015. Study time: 07:49 AM. Location: Endoscopy.  -------------------------------------------------------------------  ------------------------------------------------------------------- Left ventricle: Systolic function was normal. Wall motion was normal; there were no regional wall motion abnormalities.  ------------------------------------------------------------------- Aortic valve:  Structurally normal valve. Trileaflet; normal thickness leaflets. Cusp separation was normal. Doppler: There was no significant regurgitation.  VTI ratio of LVOT to aortic valve: 0.66. Valve area (VTI): 2.06 cm^2. Indexed valve area (VTI): 0.93 cm^2/m^2. Peak velocity ratio of LVOT to aortic valve: 0.75. Valve area (Vmax): 2.35 cm^2. Indexed valve area (Vmax): 1.06 cm^2/m^2. Mean velocity ratio of LVOT to aortic valve: 0.65. Valve area (Vmean): 2.03 cm^2. Indexed valve area (Vmean): 0.92 cm^2/m^2.  Mean gradient (S): 5 mm Hg. Peak gradient (S): 8 mm Hg.  ------------------------------------------------------------------- Aorta: There was no atheroma. There was no evidence for dissection. Aortic root: The aortic root was not dilated. Ascending aorta: The ascending aorta was normal in size. Aortic arch: The aortic arch was normal in size. Descending aorta: The descending aorta was normal in size.  ------------------------------------------------------------------- Mitral valve: Leaflet separation was normal. Mobility of the posterior leaflet was  restricted. Doppler: There was severe regurgitation.  ------------------------------------------------------------------- Left atrium: The atrium was dilated. No evidence of thrombus in the atrial cavity or appendage. No evidence of thrombus in the atrial cavity or appendage. The appendage was morphologically a left appendage, multilobulated, and of normal size. Emptying velocity was normal.  ------------------------------------------------------------------- Atrial septum: No defect or patent foramen ovale was identified by color flow Doppler.  ------------------------------------------------------------------- Pulmonary veins: Left upper pulmonary vein: There was systolic flow reversal.  ------------------------------------------------------------------- Right ventricle: The cavity size was normal. Wall thickness was normal. Systolic function was normal.  ------------------------------------------------------------------- Pulmonic valve:  Structurally normal valve.  ------------------------------------------------------------------- Tricuspid valve:  Structurally normal valve.  Leaflet separation was normal. Doppler: There was trivial regurgitation.  ------------------------------------------------------------------- Pulmonary artery:  The main pulmonary artery was normal-sized.  ------------------------------------------------------------------- Right atrium: The atrium was normal in size. No evidence of thrombus in the atrial cavity or appendage. The appendage was morphologically a right appendage.  ------------------------------------------------------------------- Pericardium: There was no pericardial effusion.  ------------------------------------------------------------------- Measurements  Left ventricle              Value Stroke volume, 2D            61   ml Stroke volume/bsa, 2D          28    ml/m^2  LVOT                   Value LVOT ID, S                20   mm LVOT area  3.14  cm^2 LVOT peak velocity, S          104  cm/s LVOT mean velocity, S          66.6  cm/s LVOT VTI, S               19.3  cm Stroke volume (SV), LVOT DP       60.6  ml Stroke index (SV/bsa), LVOT DP      27.5  ml/m^2  Aortic valve               Value Aortic valve peak velocity, S      139  cm/s Aortic valve mean velocity, S      103  cm/s Aortic valve VTI, S           29.4  cm Aortic mean gradient, S         5   mm Hg Aortic peak gradient, S         8   mm Hg VTI ratio, LVOT/AV            0.66 Aortic valve area, VTI          2.06  cm^2 Aortic valve area/bsa, VTI        0.93  cm^2/m^2 Velocity ratio, peak, LVOT/AV      0.75 Aortic valve area, peak velocity     2.35  cm^2 Aortic valve area/bsa, peak velocity   1.06  cm^2/m^2 Velocity ratio, mean, LVOT/AV      0.65 Aortic valve area, mean velocity     2.03  cm^2 Aortic valve area/bsa, mean velocity   0.92  cm^2/m^2  Left atrium               Value LA appendage peak velocity        77.02 cm/s  Mitral valve               Value Mitral annulus diameter, A-P       31.9  mm Mitral maximal inflow velocity, PISA   501.47 cm/s Mitral annulus VTI, D          168.3 cm Mitral transvalvular flow, D       1169.9 ml Aliasing velocity, MR PISA        34.3  cm/s Mitral regurg PISA radius        11   mm Mitral maximal regurg velocity, PISA   539.53 cm/s Mitral regurg VTI, PISA         188  cm Mitral ERO, PISA             0.48  cm^2 Mitral regurg volume, PISA        90.9   ml Mitral regurg fraction, PISA       59.98 %  Legend: (L) and (H) mark values outside specified reference range.  ------------------------------------------------------------------- Prepared and Electronically Authenticated by  [place]atch, MD 2016-11-17T10:53:52    CARDIAC CATHETERIZATION  Procedures    Right/Left Heart Cath and Coronary Angiography    Conclusion    1. Angiographically minimal CAD with most significant being 45% distal circumflex-OM 3 stenosis 2. The left ventricular systolic function is normal -hand injection left ventricular angiography did not allow for significant evaluation of MR. 3. Normal right heart cath pressures but no pulmonary hypertension. No large V wave noted on the PCWP tracing. 4. TEE documentation of Severe MR   The patient does not have any significant CAD that would require  bypass grafting. PA pressure stable.  Plan:  Standard post radial cath care with TR band removal. Discharge after Bed Rest & TR Band removal.  He should be able to return to work within 48 hours.  Will refer back to [place] to schedule CT surgical consultation.   [place], M.D., M.S. Interventional Cardiologist   Pager # [phone]      Indications    Severe mitral regurgitation [I34.0 (ICD-10-CM)]    Technique and Indications    PCP: [place] Cobble, MD Cardiologist: Sharol Harness, MD \   49 year old gentleman with recently diagnosed severe MR by TEE. He has had an admission for acute heart failure in the past few months. He was evaluated initially with an echocardiogram which had conflicting data. TEE confirmed severe MR and he is now referred for right heart catheterization for potential preoperative valve repair.  PROCEDURES: Estimated blood loss <50 mL. There were no immediate complications during the procedure.  Time Out: Verified patient identification, verified procedure,  site/side was marked, verified correct patient position, special equipment/implants available, medications/allergies/relevent history reviewed, required imaging and test results available. Performed.  The patient was sedated with 50 g IV fentanyl, 2 mgIV Versed  Access:  RIGHT Radial Artery: 6 Fr sheath -- Seldinger technique using Angiocath Micropuncture Kit * 5 mL radial cocktail IA (Radial Cocktail: 5 mg Verapamil, 400 mcg NTG, 2 ml 2% Lidocaine in 10 ml NS -- 5 mL given * 4000 Units IV Heparin  Right Brachial/Antecubital Vein: The existing 18-gauge IV was exchanged over a wire for a 5Fr short sheath  Right Heart Catheterization: 5 Fr Swan Ganz catheter advanced under fluoroscopy with balloon inflated to the RA, RV, then PCWP-PA for hemodynamic measurement.  Simultaneous FA & PA blood gases checked for SaO2% to calculate FICK CO/CI  Simultaneous PCWP/LV & RV/LV pressures monitored with Angled Pigtail in LV.  Catheter removed completely out of the body with balloon deflated.  Left Heart Catheterization: 5Fr TIG Catheter advanced or exchanged over a J-wire under direct fluoroscopic guidance into the ascending aorta - used to engage the coronary arteries & across the Aortic Valve for LV Hemodynamics & had injection LV Gram). Left & Right Coronary Artery Cineangiography, LV Hemodynamics & LV gram: TIG 4.0 Catheter   Brachial Sheath(s) removed in the PACU holding area with manual pressure for hemostasis.   Radial sheath removed in the Cardiac Catheterization lab with TR Band placed for hemostasis.  TR Band: 0845 Hours; 12 mL air    Coronary Findings    Dominance: Right   Left Main  Vessel was injected. Vessel is large. Vessel is angiographically normal. TR 4 Catheter     Left Anterior Descending  . Vessel is large. The vessel exhibits minimal luminal irregularities.   . First Diagonal Branch   The vessel is normal in size.   . Lateral First Diagonal Branch    The vessel is moderate in size.   . First Septal Branch   The vessel is small in size.   Marland Kitchen Second Diagonal Branch   The vessel is moderate in size.   Marland Kitchen Second Septal Branch   The vessel is small in size.   . Third Diagonal Branch   The vessel is small in size.     Left Circumflex   . Mid Cx lesion, 45% stenosed. Tubular eccentric.   . First Obtuse Marginal Branch   The vessel is moderate in size and exhibits minimal luminal irregularities.   . Second  Obtuse Marginal Branch   The vessel is small in size.   . Lateral Third Obtuse Marginal Branch   The vessel is small in size.     Right Coronary Artery  . Vessel is large.   . Prox RCA lesion, 30% stenosed. Diffuse tubular eccentric.   Marland Kitchen Acute Marginal Branch   The vessel is moderate in size.   . Right Posterior Descending Artery   The vessel is moderate in size and exhibits minimal luminal irregularities.   . Inferior Septal   The vessel is small in size.   . Right Posterior Atrioventricular Branch   The vessel is moderate in size.   . First Right Posterolateral   The vessel is small in size.       Right Heart Pressures Hemodynamic findings consistent with mitral valve regurgitation. LV EDP is normal. There does not appear to be a large V wave in PA waveform.    Left Heart    Left Ventricle The left ventricular size is normal. The left ventricular systolic function is normal. The left ventricular ejection fraction is 55-65% by visual estimate. The ejection fraction could not be assessed due to underfilling.   Mitral Valve There is mild (2+) mitral regurgitation. Hard to truly visualize   Aortic Valve There is no aortic valve stenosis, and no aortic valve regurgitation.    Coronary Diagrams    Diagnostic Diagram            Implants    Name ID Temporary Type Supply   No information to display     PACS Images    Show images for Cardiac catheterization     Link to Procedure Log    Procedure Log      Hemo Data       Most Recent Value   Fick Cardiac Output  5.45 L/min   Fick Cardiac Output Index  2.56 (L/min)/BSA   RA A Wave  8 mmHg   RA V Wave  8 mmHg   RA Mean  7 mmHg   RV Systolic Pressure  26 mmHg   RV Diastolic Pressure  7 mmHg   RV EDP  9 mmHg   PA Systolic Pressure  20 mmHg   PA Diastolic Pressure  4 mmHg   PA Mean  13 mmHg   PW A Wave  10 mmHg   PW V Wave  9 mmHg   PW Mean  7 mmHg   AO Systolic Pressure  646 mmHg   AO Diastolic Pressure  70 mmHg   AO Mean  84 mmHg   LV Systolic Pressure  96 mmHg   LV Diastolic Pressure  1 mmHg   LV EDP  5 mmHg   Arterial Occlusion Pressure Extended Systolic Pressure  803 mmHg   Arterial Occlusion Pressure Extended Diastolic Pressure  72 mmHg   Arterial Occlusion Pressure Extended Mean Pressure  87 mmHg   Left Ventricular Apex Extended Systolic Pressure  212 mmHg   Left Ventricular Apex Extended Diastolic Pressure  1 mmHg   Left Ventricular Apex Extended EDP Pressure  7 mmHg   QP/QS  1   TPVR Index  5.08 HRUI   TSVR Index  32.83 HRUI   PVR SVR Ratio  0.08   TPVR/TSVR Ratio  0.15        Impression:  Patient has stage D severe symptomatic primary mitral regurgitation. He was recently hospitalized with symptoms of acute diastolic congestive heart failure that have improved considerably with medical therapy.  I have personally reviewed the patient's recent echocardiograms and diagnostic cardiac catheterization. Transesophageal echocardiogram demonstrates the presence of moderately thickened leaflets of the mitral valve with severely restricted leaflet mobility involving the posterior leaflet and moderate foreshortening of the subvalvular apparatus  causing type IIIA dysfunction with severe mitral regurgitation. Left ventricular systolic function appears reasonably well preserved. Diagnostic cardiac catheterization is notable for absence of significant coronary artery disease and relatively normal pulmonary artery pressures. I agree the patient would best be treated with elective mitral valve repair or replacement. There remains a possibility that his valve might be repairable, although in the setting of likely rheumatic disease the likelihood of repair is reduced and the potential durability following successful repair probably muted. The patient appears to be relatively good candidate for minimally invasive approach for surgery. He has not seen a dentist in many years and will need dental clearance prior to surgery.   Plan:  The patient was counseled at length regarding the indications, risks and potential benefits of mitral valve repair. The rationale for elective surgery has been explained, including a comparison between surgery and continued medical therapy with close follow-up. The likelihood of successful and durable valve repair has been discussed with particular reference to the findings of their recent echocardiogram. Based upon these findings and previous experience, I have quoted them a 50 percent likelihood of successful valve repair. In the event that their valve cannot be successfully repaired, we discussed the possibility of replacing the mitral valve using a mechanical prosthesis with the attendant need for long-term anticoagulation versus the alternative of replacing it using a bioprosthetic tissue valve with its potential for late structural valve deterioration and failure, depending upon the patient's longevity. The patient specifically requests that if the mitral valve must be replaced that it be done using a mechanical valve. The patient understands and accepts all potential risks of surgery including but not limited to risk  of death, stroke or other neurologic complication, myocardial infarction, congestive heart failure, respiratory failure, renal failure, bleeding requiring transfusion and/or reexploration, arrhythmia, infection or other wound complications, pneumonia, pleural and/or pericardial effusion, pulmonary embolus, aortic dissection or other major vascular complication, or delayed complications related to valve repair or replacement including but not limited to structural valve deterioration and failure, thrombosis, embolization, endocarditis, or paravalvular leak. Alternative surgical approaches have been discussed including a comparison between conventional sternotomy and minimally-invasive techniques. The relative risks and benefits of each have been reviewed as they pertain to the patient's specific circumstances, and all of their questions have been addressed. Specific risks potentially related to the minimally-invasive approach were discussed at length, including but not limited to risk of conversion to full or partial sternotomy, aortic dissection or other major vascular complication, unilateral acute lung injury or pulmonary edema, phrenic nerve dysfunction or paralysis, rib fracture, chronic pain, lung hernia, or lymphocele. All of their questions have been answered.  We tentatively plan to proceed with surgery on Thursday, 11/13/2015. The patient will be referred for dental service evaluation prior to surgery. He will return for follow-up on Tuesday, 11/11/2015.   I spent in excess of 90 minutes during the conduct of this office consultation and >50% of this time involved direct face-to-face encounter with the patient for counseling and/or coordination of their care.   Valentina Gu. Oscar Manns, MD 11/04/2015 3:15 PM

## 2015-11-07 NOTE — Progress Notes (Signed)
PRE-OPERATIVE NOTE:  11/07/2015 Oscar Brown [id number]  VITALS: BP 110/73 mmHg  Pulse 56  Temp(Src) 99.1 F (37.3 C) (Oral)  Resp 16  Wt 217 lb (98.431 kg)  SpO2 100%  Lab Results  Component Value Date   WBC 5.6 10/27/2015   HGB 14.7 10/27/2015   HCT 42.4 10/27/2015   MCV 86.7 10/27/2015   PLT 188 10/27/2015   BMET    Component Value Date/Time   NA 135 10/27/2015 1544   NA 136 09/30/2015 1150   K 4.4 10/27/2015 1544   CL 102 10/27/2015 1544   CO2 29 10/27/2015 1544   GLUCOSE 80 10/27/2015 1544   GLUCOSE 82 09/30/2015 1150   BUN 17 10/27/2015 1544   BUN 21 09/30/2015 1150   CREATININE 0.95 10/27/2015 1544   CREATININE 0.89 09/30/2015 1150   CALCIUM 9.6 10/27/2015 1544   GFRNONAA 100 09/30/2015 1150   GFRAA 116 09/30/2015 1150    Lab Results  Component Value Date   INR 1.02 10/27/2015   INR 0.95 10/09/2015   No results found for: PTT   Oscar Brown presents for multiple dental extractions with alveoloplasty and gross debridement of remaining dentition as indicated in the operating room with general anesthesia. This is part of a medically necessary pre-heart valve surgery dental protocol.  SUBJECTIVE: The patient denies any acute medical or dental changes and agrees to proceed with treatment as planned.  EXAM: No sign of acute dental changes.  ASSESSMENT: Patient is affected by history of acute pulpitis, chronic apical periodontitis, multiple retained root segments, dental caries, chronic periodontitis, malocclusion, loose teeth, and accretions.  PLAN: Patient agrees to proceed with treatment as planned in the operating room as previously discussed and accepts the risks, benefits, and complications of the proposed treatment. Patient is aware of the risk for bleeding, bruising, swelling, infection, pain, nerve damage, soft tissue damage, damage to adjacent teeth, sinus involvement, root tip fracture, mandible fracture, and the risks of complications  associated with the anesthesia. Patient also is aware of the potential for other complications up to and including death due to his overall cardiovascular and respiratory compromise.    [name], DDS

## 2015-11-07 NOTE — Op Note (Signed)
OPERATIVE REPORT  Patient:            Oscar Brown Date of Birth:  Feb 04, 1966 MRN:                [id number]   DATE OF PROCEDURE:  11/07/2015  PREOPERATIVE DIAGNOSES: 1. Severe mitral regurgitation 2. Pre-heart valve surgery dental protocol 3. History of acute pulpitis 4. Chronic apical periodontitis 5. Dental caries 6. Chronic periodontitis 7. Accretions 8. Loose teeth  POSTOPERATIVE DIAGNOSES: 1. Severe mitral regurgitation 2. Pre-heart valve surgery dental protocol 3. History of acute pulpitis 4. Chronic apical periodontitis 5. Dental caries 6. Chronic periodontitis 7. Accretions 8. Loose teeth  OPERATIONS: 1. Multiple extraction of tooth numbers 1, 2, 8, 9, 12, 15, 16, 17, and 32 2. 3 Quadrants of alveoloplasty 3. Gross debridement of remaining dentition   SURGEON: [name], DDS  ASSISTANT: Camie Patience, (dental assistant)  ANESTHESIA: General anesthesia via nasoendotracheal tube.  MEDICATIONS: 1. Ancef 2 g IV prior to invasive dental procedures. 2. Local anesthesia with a total utilization of 5 carpules each containing 34 mg of lidocaine with 0.017 mg of epinephrine as well as 2 carpules each containing 9 mg of bupivacaine with 0.009 mg of epinephrine.  SPECIMENS: There are 9 teeth that were discarded.  DRAINS: None  CULTURES: None  COMPLICATIONS: None   ESTIMATED BLOOD LOSS: 100 mLs.  INTRAVENOUS FLUIDS: 1300 mLs of Lactated ringers solution.  INDICATIONS: The patient was recently diagnosed with severe mitral regurgitation.  A medically necessary dental consultation was then requested to value a poor dentition.  The patient was examined and treatment planned for multiple extractions with alveoloplasty and gross debridement of remaining dentition.  This treatment plan was formulated to decrease the risks and complications associated with dental infection from affecting the patient's systemic health and anticipated heart valve  surgery.  OPERATIVE FINDINGS: Patient was examined operating room number 10.  The teeth were identified for extraction. The patient was noted be affected by history of acute pulpitis, chronic apical periodontitis, dental caries, chronic periodontitis, accretions, and loose teeth.   DESCRIPTION OF PROCEDURE: Patient was brought to the main operating room number 10. Patient was then placed in the supine position on the operating table. General anesthesia was then induced per the anesthesia team. The patient was then prepped and draped in the usual manner for dental medicine procedure. A timeout was performed. The patient was identified and procedures were verified. A throat pack was placed at this time. The oral cavity was then thoroughly examined with the findings noted above. The patient was then ready for dental medicine procedure as follows:  Local anesthesia was then administered sequentially with a total utilization of 5 carpules each containing 34 mg of lidocaine with 0.017 mg of epinephrine as well as 2 carpules  each containing 9 mg bupivacaine with 0.009 mg of epinephrine.  The Maxillary left and right quadrants first approached. Anesthesia was then delivered utilizing infiltration with lidocaine with epinephrine. A #15 blade incision was then made from the maxillary right tuberosity and extended to the mesial #3.  A  surgical flap was then carefully reflected. Appropriate amounts of buccal and interseptal bone were then removed utilizing a surgical handpiece and bur and copious amounts of sterile water around tooth numbers 1 and 2.  Tooth numbers 1 and 2 were then subluxated with a series of straight elevators. Tooth numbers 1 and 2 were then removed with a 53R forceps without complications. Alveoloplasty was then performed utilizing a  ronguers and bone file. The surgical site was then irrigated with copious amounts of sterile saline. The tissues were approximated and trimmed appropriately. A  piece of Surgifoam was placed in each extraction socket appropriately. The surgical site was then closed from the maxillary right tuberosity and extended to the distal of #3 utilizing 3-0 chromic gut suture in a continuous interrupted suture technique 1.    At this point time tooth numbers 8 and 9 were approached and removed with a 150 forceps without complications.  15 blade incision was then made from the mesial numbers 7 and extended to the mesial of #10. A surgical flap was then carefully reflected. Alveoloplasty was then performed utilizing a rongeur and bone file. The tissues were approximated and trimmed appropriately. The surgical site was then irrigated with copious amounts sterile saline. A piece of Surgifoam was placed in each extraction socket appropriately. The surgical site was then closed from the mesial #10 extended to the mesial numbers 7 utilizing 3-0 chromic gut suture in a continuous interrupted suture technique 1.  This point time the maxillary left quadrant was approached. 15 blade incision was made from the maxillary left tuberosity and extended to the mesial of #14. A second 15 blade incision was made around tooth #12 on the buccal and palatal aspects. An envelope flap was then reflected around tooth #12. The surgical flap in the area of 15 and 16 was then elevated appropriately.  The retained roots in the area of tooth numbers 15 and 16 and tooth #12 was then removed with a 150 forceps without complications. Alveoloplasty was then performed utilizing a rongeurs and bone file in the area of tooth numbers 12, 15, and 16. The tissues were approximated and trimmed appropriately. The surgical site was then irrigated with copious amounts sterile saline.  A piece of Surgifoam was placed in the extraction sockets as needed. The surgical site was then closed from the maxillary left tuberosity and extended the distal of #14 utilizing 3-0 chromic gut suture in a continuous interrupted suture  technique 1. The extraction site in the area of tooth #12 was then closed utilizing 3-0 chromic gut material in a figure-of-eight suture technique 1.  At this point time, the mandibular quadrants were approached. The patient was given bilateral inferior alveolar nerve blocks and long buccal nerve blocks utilizing the bupivacaine with epinephrine. Further infiltration was then achieved utilizing the lidocaine with epinephrine. A 15 blade incision was then made from the distal of number 17 and extended to the mesial #18.  A second 15 blade incision was made from the distal of #32 and extended to the distal of #30.  Surgical flaps were then carefully reflected.  Tooth numbers 17 was then approached and elevated with a series of straight elevators. Tooth #17 was then removed with a 17 forceps without complications. The socket was curetted and compressed appropriately.  The surgical site was then irrigated with copious amounts of sterile saline. Surgifoam was placed the extraction sockets. The surgical site was then closed from the distal of #17 and extended the distal of #18 utilizing 3-0 chromic gut suture in a continuous interrupted suture technique 1.  Tooth #32 was then approached. Appropriate amounts of buccal and interseptal bone were then removed utilizing a surgical handpiece and copious amount of sterile water. Tooth #32 was then subluxated with a series of straight elevators. At this point time the coronal aspect was removed with a 23 forceps leaving roots remaining. The surgical handpiece and bur and  copious amounts sterile water were then utilized to section the roots from the buccal to the lingaul. Further interproximal bone was then removed as indicated. The roots were then elevated out with a series of cryers elevators without complication. Alveoloplasty was then performed utilizing a rongeur and bone file. The surgical site was then irrigated with copious amounts sterile saline. The tissues were  approximated and trimmed appropriately. The surgical site was then closed from the distal of #32 and extended to the distal of #30 utilizing 3-0 chromic gut suture in a continuous interrupted suture technique 1.   At this point time a gross debridement procedure was performed utilizing a sonic scaler. Accretions were then removed with a series of hand curettes. A sonic scaler was then again used to further refine removal of accretions. At this point time, the entire mouth was irrigated with copious amounts of sterile saline. The patient was examined for complications, seeing none, the dental medicine procedure was deemed to be complete. The throat pack was removed at this time. An oral airway was then placed at the request of the anesthesia team. A series of 4 x 4 gauze were placed in the mouth to aid hemostasis. The patient was then handed over to the anesthesia team for final disposition. After an appropriate amount of time, the patient was extubated and taken to the postanesthsia care unit in good condition. All counts were correct for the dental medicine procedure.  Patient was given a prescription for Percocet 5/325. Patient is take 1-2 tablets every 6 hours as needed for pain. Patient is to follow with Dr. Roxy Manns for evaluation for mitral valve repair or replacement as indicated per his discretion.  Patient will be scheduled for follow-up once the date of the heart surgery is known. Sutures will dissolve on their own, however.   [name], DDS.

## 2015-11-07 NOTE — Transfer of Care (Signed)
Immediate Anesthesia Transfer of Care Note  Patient: Oscar Brown  Procedure(s) Performed: Procedure(s): Extraction of tooth #'s 1,2,8,9,12,15,16,17, 32 with alveoloplasty and gross debridement of remaining teeth. (N/A)  Patient Location: PACU  Anesthesia Type:General  Level of Consciousness: awake and alert   Airway & Oxygen Therapy: Patient Spontanous Breathing and Patient connected to face mask oxygen  Post-op Assessment: Report given to RN and Post -op Vital signs reviewed and stable  Post vital signs: Reviewed and stable  Last Vitals:  Filed Vitals:   11/07/15 0810  BP: 110/73  Pulse: 56  Temp: 37.3 C  Resp: 16    Complications: No apparent anesthesia complications

## 2015-11-10 ENCOUNTER — Encounter (HOSPITAL_COMMUNITY): Payer: Self-pay | Admitting: Dentistry

## 2015-11-10 ENCOUNTER — Ambulatory Visit (INDEPENDENT_AMBULATORY_CARE_PROVIDER_SITE_OTHER): Payer: Managed Care, Other (non HMO) | Admitting: Thoracic Surgery (Cardiothoracic Vascular Surgery)

## 2015-11-10 ENCOUNTER — Ambulatory Visit
Admission: RE | Admit: 2015-11-10 | Discharge: 2015-11-10 | Disposition: A | Discharge status: Home / Self Care | Payer: Managed Care, Other (non HMO) | Source: Ambulatory Visit | Attending: Thoracic Surgery (Cardiothoracic Vascular Surgery) | Admitting: Thoracic Surgery (Cardiothoracic Vascular Surgery)

## 2015-11-10 VITALS — BP 114/76 | HR 54 | Resp 16 | Ht 68.0 in | Wt 216.0 lb

## 2015-11-10 DIAGNOSIS — I340 Nonrheumatic mitral (valve) insufficiency: Secondary | ICD-10-CM | POA: Diagnosis not present

## 2015-11-10 DIAGNOSIS — R911 Solitary pulmonary nodule: Secondary | ICD-10-CM

## 2015-11-10 DIAGNOSIS — I5032 Chronic diastolic (congestive) heart failure: Secondary | ICD-10-CM

## 2015-11-10 DIAGNOSIS — I7409 Other arterial embolism and thrombosis of abdominal aorta: Secondary | ICD-10-CM

## 2015-11-10 DIAGNOSIS — I719 Aortic aneurysm of unspecified site, without rupture: Secondary | ICD-10-CM

## 2015-11-10 HISTORY — DX: Solitary pulmonary nodule: R91.1

## 2015-11-10 MED ORDER — AMIODARONE HCL 200 MG PO TABS: 200.0000 mg | ORAL_TABLET | Freq: Two times a day (BID) | ORAL | Status: DC

## 2015-11-10 MED ORDER — IOPAMIDOL (ISOVUE-370) INJECTION 76%
75.0000 mL | Freq: Once | INTRAVENOUS | Status: AC | PRN
  Administered 2015-11-10: 75 mL via INTRAVENOUS

## 2015-11-10 NOTE — Progress Notes (Signed)
[address]       [place] [address]             [phone]     CARDIOTHORACIC SURGERY OFFICE NOTE  Referring Provider is Skeet Latch, MD PCP is Karren Cobble, MD   HPI:  Patient returns to the office today for follow-up of severe symptomatic mitral regurgitation. He was originally seen in consultation on 11/04/2015. Since then he underwent consultation and dental extraction by Dr. Lawana Chambers.  He returns to our office today with hopes to proceed with elective mitral valve repair or replacement later this week. He reports no new problems or complaints since he was last seen in the office. He did well following dental extraction and reports only mild residual soreness in his mouth.   Current Outpatient Prescriptions  Medication Sig Dispense Refill  . furosemide (LASIX) 20 MG tablet Take 1 tablet (20 mg total) by mouth daily. 30 tablet 3   No current facility-administered medications for this visit.      Physical Exam:   BP 114/76 mmHg  Pulse 54  Resp 16  Ht 5\' 8"  (1.727 m)  Wt 216 lb (97.977 kg)  BMI 32.85 kg/m2  SpO2 97%  General:  Well-appearing  Chest:   Clear to auscultation  CV:   Regular rate and rhythm with systolic murmur  Incisions:  n/a  Abdomen:  Soft and nontender  Extremities:  Warm and well-perfused  Diagnostic Tests:  CT ANGIOGRAPHY CHEST, ABDOMEN AND PELVIS  TECHNIQUE: Multidetector CT imaging through the chest, abdomen and pelvis was performed using the standard protocol during bolus administration of intravenous contrast. Multiplanar reconstructed images and MIPs were obtained and reviewed to evaluate the vascular anatomy.  CONTRAST: 75 mL Isovue 370  COMPARISON: Abdominal CT 06/13/2014  FINDINGS: CTA CHEST FINDINGS  Vascular structures: The mid ascending thoracic aorta measures up to 3.7 cm without evidence of a dissection. Great vessels are patent. Normal caliber of the descending thoracic aorta.  No significant atherosclerotic disease in the thoracic aorta. Pulmonary arteries are not opacified. Main pulmonary artery is prominent for size measuring up to 3.9 cm. Heart is slightly enlarged. No pericardial effusion.  Nonvascular structures: No gross abnormality to the visualized thyroid tissue. Prevascular lymph node tissue measures 0.9 cm. Overall, no significant chest lymphadenopathy. Small axillary lymph nodes bilaterally. Trachea and mainstem bronchi are patent. Mild centrilobular emphysema at the lung apices. 3 mm nodule along the anterior right minor fissure on sequence 5, image 29. The lungs are clear without airspace disease or large areas of consolidation. No acute bone abnormality.  Review of the MIP images confirms the above findings.  CTA ABDOMEN AND PELVIS FINDINGS  Vascular structures: Normal caliber of the abdominal aorta without aneurysm. There is minimal calcified plaque involving the infrarenal abdominal aorta. The celiac trunk, SMA and IMA are patent. Main branch vessels to the celiac trunk are patent. Bilateral renal arteries are patent. There are accessory inferior renal arteries bilaterally. Normal caliber of the iliac arteries bilaterally without stenosis. There is minimal calcified plaque in the right internal iliac artery. Proximal femoral arteries are widely patent bilaterally. Mild tortuosity of the common and external iliac arteries.  Nonvascular structures: No acute abnormality to the liver or gallbladder. Normal appearance of the spleen and pancreas. Chronic fullness of the adrenal glands could represent hyperplasia. 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 There is an extrarenal pelvis involving both kidneys. Normal appearance of the bowel structures without obstruction. No  gross abnormality to the prostate, seminal vesicles or urinary bladder. There is a small amount of free fluid in the pelvis which is new from the  prior examination. No acute abnormality to the appendix. Grade 1 anterolisthesis at L4-L5 appears to be secondary to severe facet arthropathy. Mild deformity along the superior endplate of L1 appears chronic.  Review of the MIP images confirms the above findings.  IMPRESSION: Minimal atherosclerotic disease in the chest, abdomen or pelvis. Mild tortuosity of the iliac vessels.  Small amount of free fluid in the pelvis of unknown etiology. An inflammatory source in the abdomen and pelvis is not clearly identified.  Accessory renal arteries as described.  3 mm nodule along the right minor fissure is nonspecific. Given risk factors for bronchogenic carcinoma, follow-up chest CT at 1 year is recommended. This recommendation follows the consensus statement: Guidelines for Management of Small Pulmonary Nodules Detected on CT Scans: A Statement from the [place] as published in Radiology 2005; 237:395-400.   Electronically Signed  By: Markus Daft M.D.  On: 11/10/2015 14:20   Impression:  Patient has stage D severe symptomatic primary mitral regurgitation. He was recently hospitalized with symptoms of acute diastolic congestive heart failure that have improved considerably with medical therapy. I have personally reviewed the patient's recent echocardiograms and diagnostic cardiac catheterization. Transesophageal echocardiogram demonstrates the presence of moderately thickened leaflets of the mitral valve with severely restricted leaflet mobility involving the posterior leaflet and moderate foreshortening of the subvalvular apparatus causing type IIIA dysfunction with severe mitral regurgitation. Left ventricular systolic function appears reasonably well preserved. Diagnostic cardiac catheterization is notable for absence of significant coronary artery disease and relatively normal pulmonary artery pressures. I agree the patient would best be treated with elective  mitral valve repair or replacement. There remains a possibility that his valve might be repairable, although in the setting of likely rheumatic disease the likelihood of repair is reduced and the potential durability following successful repair probably muted. The patient appears to be relatively good candidate for minimally invasive approach for surgery. Preoperative CT scan of the chest reveals a 3 mm nodule in the right lung that appears relatively low risk but probably requires follow-up CT scan in 1 year to ensure stability.   Plan:  The patient was again counseled at length regarding the indications, risks and potential benefits of mitral valve repair. The rationale for elective surgery has been explained, including a comparison between surgery and continued medical therapy with close follow-up. The likelihood of successful and durable valve repair has been discussed with particular reference to the findings of their recent echocardiogram. Based upon these findings and previous experience, I have quoted them a 50 percent likelihood of successful valve repair. In the event that their valve cannot be successfully repaired, we discussed the possibility of replacing the mitral valve using a mechanical prosthesis with the attendant need for long-term anticoagulation versus the alternative of replacing it using a bioprosthetic tissue valve with its potential for late structural valve deterioration and failure, depending upon the patient's longevity. The patient specifically requests that if the mitral valve must be replaced that it be done using a mechanical valve. The patient understands and accepts all potential risks of surgery including but not limited to risk of death, stroke or other neurologic complication, myocardial infarction, congestive heart failure, respiratory failure, renal failure, bleeding requiring transfusion and/or reexploration, arrhythmia, infection or other wound complications,  pneumonia, pleural and/or pericardial effusion, pulmonary embolus, aortic dissection or other major vascular complication,  or delayed complications related to valve repair or replacement including but not limited to structural valve deterioration and failure, thrombosis, embolization, endocarditis, or paravalvular leak. Alternative surgical approaches have been discussed including a comparison between conventional sternotomy and minimally-invasive techniques. The relative risks and benefits of each have been reviewed as they pertain to the patient's specific circumstances, and all of their questions have been addressed. Specific risks potentially related to the minimally-invasive approach were discussed at length, including but not limited to risk of conversion to full or partial sternotomy, aortic dissection or other major vascular complication, unilateral acute lung injury or pulmonary edema, phrenic nerve dysfunction or paralysis, rib fracture, chronic pain, lung hernia, or lymphocele. All of their questions have been answered.  I spent in excess of 15 minutes during the conduct of this office consultation and >50% of this time involved direct face-to-face encounter with the patient for counseling and/or coordination of their care.   Valentina Gu. Roxy Manns, MD 11/10/2015 2:37 PM

## 2015-11-10 NOTE — H&P (Signed)
[address]       [place] [address]             [phone]          CARDIOTHORACIC SURGERY HISTORY AND PHYSICAL EXAM  Referring Provider is Skeet Latch, MD PCP is Karren Cobble, MD  Chief Complaint  Patient presents with  . Mitral Regurgitation    eval for MVR.Marland KitchenECHO 09/22/15.Marland KitchenMarland KitchenCATH 11/03/15    HPI:  Patient is a 49 year old moderately obese African-American male with OSA, lymphedema and remote h/o DVT who is referred for surgical consultation of recently discovered severe symptomatic mitral regurgitation. Patient states that he was first noted to have a heart murmur on physical exam to her 3 years ago by his primary care physician. He has remained physically active and asymptomatic until recently. In October he developed fairly rapid onset symptoms of exertional shortness of breath and orthopnea with mild lower extremity edema. Symptoms progressed until he eventually presented to the hospital where he was hospitalized with acute diastolic congestive heart failure. Transthoracic echocardiogram revealed mitral regurgitation with normal left ventricular systolic function. Ejection fraction was estimated 55-60%. Visually the mitral regurgitation was felt to be moderate to severe, but calculations suggested the presence of only moderate mitral regurgitation with ERO measured 0.19 corresponding to regurgitant volume estimated 39 mL. The patient responded well to diuretic therapy and was seen in follow-up by Dr. Oval Linsey who performed TEE on 10/16/2015 revealed thickened mitral valve leaflets with severely restricted posterior leaflet and severe mitral regurgitation. ERO measured 0.48 corresponding to a regurgitant volume estimated 91 mL and regurgitant fraction estimated 60%. Left ventricular size and systolic function appeared normal. The aortic valve was normal. Right ventricular size and systolic function was normal. There was trivial tricuspid  regurgitation. The patient subsequently underwent left and right heart catheterization on 11/03/2015. The patient was found to have mild nonobstructive coronary artery disease and normal pulmonary artery pressures. The patient was referred for elective surgical consultation.  The patient is separated from his wife and lives locally in [place]. He has 2 adult children and one 11 year old son. 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 He has been physically active all of his life. He reports no significant physical limitations. He states that up until recently has not experience any significant symptoms of exertional shortness of breath. He states that the symptoms progressed fairly rapidly in October, ultimately causing him to present to the hospital as previously stated. Since hospital discharge the patient has done well on medical therapy. He denies any history of exertional chest pain or chest tightness. He had severe exertional shortness of breath, orthopnea, and a dry non-productive cough at the time of his recent hospital admission. The symptoms have improved. He has had one dizzy spell without any history of syncope. He denies any history of palpitations.  Patient returns to the office today for follow-up of severe symptomatic mitral regurgitation. He was originally seen in consultation on 11/04/2015. Since then he underwent consultation and dental extraction by Dr. Lawana Chambers. He returns to our office today with hopes to proceed with elective mitral valve repair or replacement later this week. He reports no new problems or complaints since he was last seen in the office. He did well following dental extraction and reports only mild residual soreness in his mouth.         Past Medical History  Diagnosis Date  .  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  Complicated by chronic left lower  extremity edema and occasional recurrent left lower extremity cellulitis   . Obstructive sleep apnea 12/14/2006    AHI 19.2/hr, desaturation to 75% on Polysomnography 02/26/2005.  Uses 11 cm H2O nocturnal nasal CPAP with good symptom control.   . Obesity, Class II, BMI 35-39.9 12/14/2006  . Mitral regurgitation 10/06/2012  . Lymphedema   . Fatty liver   . Acute diastolic congestive heart failure ([facility]) 09/21/2015  . DVT (deep venous thrombosis) ([facility]) 1982  . Incidental lung nodule, less than or equal to 60m 11/10/2015    3 mm nodule right lung    Past Surgical History  Procedure Laterality Date  . Tee without cardioversion N/A 10/16/2015    Procedure: TRANSESOPHAGEAL ECHOCARDIOGRAM (TEE);  Surgeon: TSkeet Latch MD;  [facility]  Service: Cardiovascular;  Laterality: N/A;  . Cardiac catheterization N/A 11/03/2015    Procedure: Right/Left Heart Cath and Coronary Angiography;  Surgeon: DLeonie Man MD;  [facility];  Service: Cardiovascular;  Laterality: N/A;  . Multiple extractions with alveoloplasty N/A 11/07/2015    Procedure: Extraction of tooth #'s 1,2,8,9,12,15,16,17, 32 with alveoloplasty and gross debridement of remaining teeth.;  Surgeon: RLenn Cal DDS;  [facility]  Service: Oral Surgery;  Laterality: N/A;    No family history on file.  Social History Social History  Substance Use Topics  . Smoking status: Former Smoker -- 0.10 packs/day for 9 years    Types: Cigarettes    Quit date: 09/05/2012  . Smokeless tobacco: Never Used  . Alcohol Use: No    Prior to Admission medications   Medication Sig Start Date End Date Taking? Authorizing Provider  furosemide (LASIX) 20 MG tablet Take 1 tablet (20 mg total) by mouth daily. 09/23/15  Yes WNorval Gable MD  amiodarone (PACERONE) 200 MG tablet Take 1 tablet (200 mg total) by mouth 2 (two) times daily. 11/10/15   CRexene Alberts MD    Allergies  Allergen Reactions  . Iohexol  Hives and Itching    Patient needs 13 hour prep per Dr. WPascal Lux after cath he broke out in hives and had itching     Review of Systems:  General:normal appetite, normal energy, no weight gain, no weight loss, no fever Cardiac:no chest pain with exertion, no chest pain at rest, + SOB with exertion, no resting SOB, no PND, + orthopnea, no palpitations, no arrhythmia, no atrial fibrillation, + LE edema, + dizzy spells, no syncope Respiratory:+ shortness of breath, no home oxygen, no productive cough, + dry cough, no bronchitis, no wheezing, no hemoptysis, no asthma, no pain with inspiration or cough, + sleep apnea, + CPAP at night GI:no difficulty swallowing, no reflux, no frequent heartburn, no hiatal hernia, no abdominal pain, no constipation, no diarrhea, no hematochezia, no hematemesis, no melena GU:no dysuria, no frequency, no urinary tract infection, no hematuria, no enlarged prostate, no kidney stones, no kidney disease Vascular:no pain suggestive of claudication, no pain in feet, no leg cramps, no varicose veins, + DVT, no non-healing foot ulcer Neuro:no stroke, no TIA's, no seizures, no headaches, no temporary blindness one eye, no slurred speech, no peripheral neuropathy, no chronic pain, no instability of gait, no memory/cognitive dysfunction Musculoskeletal:no arthritis, no joint swelling, no myalgias, no difficulty walking, normao mobility  Skin:no rash, no itching, no skin infections, no pressure sores or ulcerations Psych:no anxiety, no depression, no nervousness,  no unusual recent stress Eyes:occasional  blurry vision, no floaters, no recent vision changes, does not wears glasses or contacts ENT:no hearing loss, no loose or painful teeth, no dentures, last saw dentist many years ago Hematologic:no easy bruising, no abnormal bleeding, no clotting disorder, no frequent epistaxis Endocrine:no diabetes, does not check CBG's at home   Physical Exam:  BP 132/87 mmHg  Pulse 56  Resp 16  Ht _0  (1.727 m)  Wt 217 lb (98.431 kg)  BMI 33.00 kg/m2  SpO2 98% General:Moderately obese, o/w well-appearing HEENT:Unremarkable  Neck:no JVD, no bruits, no adenopathy  Chest:clear to auscultation, symmetrical breath sounds, no wheezes, no rhonchi  CV:RRR, grade III/VI holosystolic murmur  Abdomen:soft, non-tender, no masses  Extremities:warm, well-perfused, pulses diminished but palpable, + mild LE edema L>R Rectal/GUDeferred Neuro:Grossly non-focal and symmetrical throughout Skin:Clean and dry, no rashes, no breakdown   Diagnostic Tests:  Transthoracic Echocardiography  Patient:  Luciano, Cinquemani MR #:    [id number] Study Date: 09/22/2015 Gender:   M Age:    37 Height:   172.7 cm Weight:   96.6 kg BSA:    2.18 m^2 Pt. Status: Room:    3E12C  SONOGRAPHER Diamond Nickel PERFORMING  [facility], Inpatient ADMITTING  Axel Filler ATTENDING  Lalla Brothers Halford Chessman, [facility], [place]  [place]  cc:  ------------------------------------------------------------------- LV EF: 55% -  60%  ------------------------------------------------------------------- Indications:   Dyspnea 786.09.  ------------------------------------------------------------------- History:  PMH: Obstructive sleep apnea. Obesity. Mitral regurgitation. Lymphedema.  ------------------------------------------------------------------- Study Conclusions  - Left ventricle: The cavity size was normal. Systolic function was normal. The estimated ejection fraction was in the range of 55% to 60%. Wall motion was normal; there were no regional wall motion abnormalities. - Aortic valve: Moderate diffuse thickening and calcification, consistent with sclerosis. - Mitral valve: Mild diffuse calcification of the anterior leaflet. Visually there appears to be moderate to severe regurgitation directed eccentrically and posteriorly on the apical 4 chamber and 2 chamber views. The MR is calculated as mild to mdoerate using the ERO and MR volume. Consider TEE to assess degree of MR further. - Left atrium: The atrium was severely dilated. - Right ventricle: The cavity size was mildly dilated. Wall thickness was normal. - Pulmonary arteries: PA peak pressure: 35 mm Hg (S).  Impressions:  - The right ventricular systolic pressure was increased consistent with mild pulmonary hypertension.  Transthoracic echocardiography. M-mode, complete 2D, spectral Doppler, and color Doppler. Birthdate: Patient birthdate: 1966-05-20. Age: Patient is 49 yr old. Sex: Gender: male. BMI: 32.4 kg/m^2. Blood pressure:   132/80 Patient status: Inpatient. Study date: Study date: 09/22/2015. Study time: 10:45 AM. Location: Echo laboratory.  -------------------------------------------------------------------  ------------------------------------------------------------------- Left  ventricle: The cavity size was normal. Systolic function was normal. The estimated ejection fraction was in the range of 55% to 60%. Wall motion was normal; there were no regional wall motion abnormalities.  ------------------------------------------------------------------- Aortic valve:  Trileaflet. Moderate diffuse thickening and calcification, consistent with sclerosis. Mobility was not restricted. Doppler: Transvalvular velocity was within the normal range. There was no stenosis. There was no regurgitation.  ------------------------------------------------------------------- Aorta: Aortic root: The aortic root was normal in size.  ------------------------------------------------------------------- Mitral valve:  Mild diffuse calcification of the anterior leaflet. Mobility was not restricted. Doppler: Transvalvular velocity was within the normal range. There was no evidence for stenosis. Visually there appears to be moderate to severe regurgitation directed eccentrically and posteriorly on the apical 4 chamber and 2 chamber views. The MR is  calculated as mild to mdoerate using the ERO and MR volume. Consider TEE to assess degree of MR further Peak gradient (D): 5 mm Hg.  ------------------------------------------------------------------- Left atrium: The atrium was severely dilated.  ------------------------------------------------------------------- Right ventricle: The cavity size was mildly dilated. Wall thickness was normal. Systolic function was normal.  ------------------------------------------------------------------- Pulmonic valve:  Structurally normal valve.  Cusp separation was normal. Doppler: Transvalvular velocity was within the normal range. There was no evidence for stenosis. There was no regurgitation.  ------------------------------------------------------------------- Tricuspid valve:  Structurally normal valve.  Doppler: Transvalvular  velocity was within the normal range. There was mild regurgitation.  ------------------------------------------------------------------- Pulmonary artery:  The main pulmonary artery was normal-sized. Systolic pressure was within the normal range.  ------------------------------------------------------------------- Right atrium: The atrium was normal in size.  ------------------------------------------------------------------- Pericardium: There was no pericardial effusion.  ------------------------------------------------------------------- Systemic veins: Inferior vena cava: The vessel was normal in size.  ------------------------------------------------------------------- Measurements  Left ventricle             Value    Reference LV ID, ED, PLAX chordal        47.7 mm   43 - 52 LV ID, ES, PLAX chordal        35.4 mm   23 - 38 LV fx shortening, PLAX chordal (L)   26  %   >=29 LV PW thickness, ED          12.3 mm   --------- IVS/LV PW ratio, ED          0.77     <=1.3 LV e&', lateral             11.1 cm/s  --------- LV E/e&', lateral            9.91     --------- LV e&', medial             7.28 cm/s  --------- LV E/e&', medial            15.11    --------- LV e&', average             9.19 cm/s  --------- LV E/e&', average            11.97    ---------  Ventricular septum           Value    Reference IVS thickness, ED           9.43 mm   ---------  LVOT                  Value    Reference LVOT ID, S               20  mm   --------- LVOT area               3.14 cm^2  ---------  Aorta                 Value    Reference Aortic root ID, ED           34  mm   ---------  Left  atrium              Value    Reference LA ID, A-P, ES             41  mm   --------- LA ID/bsa, A-P             1.88 cm/m^2 <=2.2 LA volume, S  175  ml   --------- LA volume/bsa, S            80.1 ml/m^2 --------- LA volume, ES, 1-p A4C         181  ml   --------- LA volume/bsa, ES, 1-p A4C       82.8 ml/m^2 --------- LA volume, ES, 1-p A2C         172  ml   --------- LA volume/bsa, ES, 1-p A2C       78.7 ml/m^2 ---------  Mitral valve              Value    Reference Mitral E-wave peak velocity      110  cm/s  --------- Mitral A-wave peak velocity      108  cm/s  --------- Mitral deceleration time    (H)   532  ms   150 - 230 Mitral peak gradient, D        5   mm Hg --------- Mitral E/A ratio, peak         1      --------- Aliasing velocity, MR PISA       38.5 cm/s  --------- Mitral regurg PISA radius       7   mm   --------- Mitral regurg VTI, PISA        207  cm   --------- Mitral ERO, PISA            0.19 cm^2  --------- Mitral regurg volume, PISA       39  ml   ---------  Pulmonary arteries           Value    Reference PA pressure, S, DP       (H)   35  mm Hg <=30  Tricuspid valve            Value    Reference Tricuspid regurg peak velocity     281  cm/s  --------- Tricuspid peak RV-RA gradient     32  mm Hg ---------  Systemic veins             Value    Reference Estimated CVP             3   mm Hg ---------  Right ventricle            Value    Reference RV pressure, S, DP       (H)   35  mm Hg <=30 RV s&', lateral, S           15.8 cm/s  ---------  Legend: (L) and (H) mark  values outside specified reference range.  ------------------------------------------------------------------- Prepared and Electronically Authenticated by  Fransico Him, MD 2016-10-24T15:51:26    Transesophageal Echocardiography  Patient:  Briscoe, Daniello MR #:    [id number] Study Date: 10/16/2015 Gender:   M Age:    49 Height:   172.7 cm Weight:   98.4 kg BSA:    2.21 m^2 Pt. Status: Room:  SONOGRAPHER Darlina Sicilian, RDCS ADMITTING  Skeet Latch, MD ATTENDING  Skeet Latch, MD ORDERING   Skeet Latch, MD PERFORMING  Skeet Latch, MD REFERRING  Skeet Latch, MD  cc:  -------------------------------------------------------------------  ------------------------------------------------------------------- Indications:   Mitral Valve Disorder 424.0/ I05.9.  ------------------------------------------------------------------- History:  PMH: Dyspnea/ Volume Overload. Sleep Apnea. Chronic Venous HTN due to DVT.  ------------------------------------------------------------------- Study Conclusions  - Left ventricle: Systolic function was normal. Wall motion was normal; there were  no regional wall motion abnormalities. - Aortic valve: Valve area (VTI): 2.06 cm^2. Valve area (Vmax): 2.35 cm^2. Valve area (Vmean): 2.03 cm^2. - Mitral valve: Mobility of the posterior leaflet was restricted. There was severe regurgitation. Aliasing velocity of regurgitation (PISA): 34.3 cm/s. Radius of regurgitation (PISA): 11 mm. Effective regurgitant orifice (PISA): 0.48 cm^2. Regurgitant volume (PISA): 90.9 ml. - Left atrium: The atrium was dilated. No evidence of thrombus in the atrial cavity or appendage. No evidence of thrombus in the atrial cavity or appendage. - Left upper pulmonary vein: There was systolic flow reversal. - Right ventricle: The cavity size was normal. Wall thickness was normal. Systolic  function was normal. - Right atrium: No evidence of thrombus in the atrial cavity or appendage. - Atrial septum: No defect or patent foramen ovale was identified by color flow Doppler. - Tricuspid valve: There was trivial regurgitation.  Impressions:  - Severe mitral regurgitation due to a tethered posterior mitral valve leaflet.  Diagnostic transesophageal echocardiography. 2D and color Doppler. Birthdate: Patient birthdate: 28-May-1966. Age: Patient is 49 yr old. Sex: Gender: male.  BMI: 33 kg/m^2. Blood pressure: 116/82 Patient status: Inpatient. Study date: Study date: 10/16/2015. Study time: 07:49 AM. Location: Endoscopy.  -------------------------------------------------------------------  ------------------------------------------------------------------- Left ventricle: Systolic function was normal. Wall motion was normal; there were no regional wall motion abnormalities.  ------------------------------------------------------------------- Aortic valve:  Structurally normal valve. Trileaflet; normal thickness leaflets. Cusp separation was normal. Doppler: There was no significant regurgitation.  VTI ratio of LVOT to aortic valve: 0.66. Valve area (VTI): 2.06 cm^2. Indexed valve area (VTI): 0.93 cm^2/m^2. Peak velocity ratio of LVOT to aortic valve: 0.75. Valve area (Vmax): 2.35 cm^2. Indexed valve area (Vmax): 1.06 cm^2/m^2. Mean velocity ratio of LVOT to aortic valve: 0.65. Valve area (Vmean): 2.03 cm^2. Indexed valve area (Vmean): 0.92 cm^2/m^2.  Mean gradient (S): 5 mm Hg. Peak gradient (S): 8 mm Hg.  ------------------------------------------------------------------- Aorta: There was no atheroma. There was no evidence for dissection. Aortic root: The aortic root was not dilated. Ascending aorta: The ascending aorta was normal in size. Aortic arch: The aortic arch was normal in size. Descending aorta: The descending aorta was normal in  size.  ------------------------------------------------------------------- Mitral valve: Leaflet separation was normal. Mobility of the posterior leaflet was restricted. Doppler: There was severe regurgitation.  ------------------------------------------------------------------- Left atrium: The atrium was dilated. No evidence of thrombus in the atrial cavity or appendage. No evidence of thrombus in the atrial cavity or appendage. The appendage was morphologically a left appendage, multilobulated, and of normal size. Emptying velocity was normal.  ------------------------------------------------------------------- Atrial septum: No defect or patent foramen ovale was identified by color flow Doppler.  ------------------------------------------------------------------- Pulmonary veins: Left upper pulmonary vein: There was systolic flow reversal.  ------------------------------------------------------------------- Right ventricle: The cavity size was normal. Wall thickness was normal. Systolic function was normal.  ------------------------------------------------------------------- Pulmonic valve:  Structurally normal valve.  ------------------------------------------------------------------- Tricuspid valve:  Structurally normal valve.  Leaflet separation was normal. Doppler: There was trivial regurgitation.  ------------------------------------------------------------------- Pulmonary artery:  The main pulmonary artery was normal-sized.  ------------------------------------------------------------------- Right atrium: The atrium was normal in size. No evidence of thrombus in the atrial cavity or appendage. The appendage was morphologically a right appendage.  ------------------------------------------------------------------- Pericardium: There was no pericardial  effusion.  ------------------------------------------------------------------- Measurements  Left ventricle              Value Stroke volume, 2D            61   ml Stroke volume/bsa, 2D  28   ml/m^2  LVOT                   Value LVOT ID, S                20   mm LVOT area                3.14  cm^2 LVOT peak velocity, S          104  cm/s LVOT mean velocity, S          66.6  cm/s LVOT VTI, S               19.3  cm Stroke volume (SV), LVOT DP       60.6  ml Stroke index (SV/bsa), LVOT DP      27.5  ml/m^2  Aortic valve               Value Aortic valve peak velocity, S      139  cm/s Aortic valve mean velocity, S      103  cm/s Aortic valve VTI, S           29.4  cm Aortic mean gradient, S         5   mm Hg Aortic peak gradient, S         8   mm Hg VTI ratio, LVOT/AV            0.66 Aortic valve area, VTI          2.06  cm^2 Aortic valve area/bsa, VTI        0.93  cm^2/m^2 Velocity ratio, peak, LVOT/AV      0.75 Aortic valve area, peak velocity     2.35  cm^2 Aortic valve area/bsa, peak velocity   1.06  cm^2/m^2 Velocity ratio, mean, LVOT/AV      0.65 Aortic valve area, mean velocity     2.03  cm^2 Aortic valve area/bsa, mean velocity   0.92  cm^2/m^2  Left atrium               Value LA appendage peak velocity        77.02 cm/s  Mitral valve               Value Mitral annulus diameter, A-P       31.9  mm Mitral maximal inflow velocity, PISA   501.47 cm/s Mitral annulus VTI, D          168.3 cm Mitral transvalvular flow, D       1169.9 ml Aliasing velocity, MR PISA        34.3  cm/s Mitral regurg  PISA radius        11   mm Mitral maximal regurg velocity, PISA   539.53 cm/s Mitral regurg VTI, PISA         188  cm Mitral ERO, PISA             0.48  cm^2 Mitral regurg volume, PISA        90.9  ml Mitral regurg fraction, PISA       59.98 %  Legend: (L) and (H) mark values outside specified reference range.  ------------------------------------------------------------------- Prepared and Electronically Authenticated by  Skeet Latch, MD 2016-11-17T10:53:52    CARDIAC CATHETERIZATION  Procedures    Right/Left Heart Cath and Coronary Angiography    Conclusion    1. Angiographically minimal CAD with most significant being  45% distal circumflex-OM 3 stenosis 2. The left ventricular systolic function is normal -hand injection left ventricular angiography did not allow for significant evaluation of MR. 3. Normal right heart cath pressures but no pulmonary hypertension. No large V wave noted on the PCWP tracing. 4. TEE documentation of Severe MR   The patient does not have any significant CAD that would require bypass grafting. PA pressure stable.  Plan:  Standard post radial cath care with TR band removal. Discharge after Bed Rest & TR Band removal.  He should be able to return to work within 48 hours.  Will refer back to Dr. Oval Linsey to schedule CT surgical consultation.   Leonie Man, M.D., M.S. Interventional Cardiologist   Pager # [phone]      Indications    Severe mitral regurgitation [I34.0 (ICD-10-CM)]    Technique and Indications    PCP: Karren Cobble, MD Cardiologist: Sharol Harness, MD \   49 year old gentleman with recently diagnosed severe MR by TEE. He has had an admission for acute heart failure in the past few months. He was evaluated initially with an echocardiogram which had conflicting data. TEE confirmed severe MR and he is now  referred for right heart catheterization for potential preoperative valve repair.  PROCEDURES: Estimated blood loss <50 mL. There were no immediate complications during the procedure.  Time Out: Verified patient identification, verified procedure, site/side was marked, verified correct patient position, special equipment/implants available, medications/allergies/relevent history reviewed, required imaging and test results available. Performed.  The patient was sedated with 50 g IV fentanyl, 2 mgIV Versed  Access:  RIGHT Radial Artery: 6 Fr sheath -- Seldinger technique using Angiocath Micropuncture Kit * 5 mL radial cocktail IA (Radial Cocktail: 5 mg Verapamil, 400 mcg NTG, 2 ml 2% Lidocaine in 10 ml NS -- 5 mL given * 4000 Units IV Heparin  Right Brachial/Antecubital Vein: The existing 18-gauge IV was exchanged over a wire for a 5Fr short sheath  Right Heart Catheterization: 5 Fr Swan Ganz catheter advanced under fluoroscopy with balloon inflated to the RA, RV, then PCWP-PA for hemodynamic measurement.  Simultaneous FA & PA blood gases checked for SaO2% to calculate FICK CO/CI  Simultaneous PCWP/LV & RV/LV pressures monitored with Angled Pigtail in LV.  Catheter removed completely out of the body with balloon deflated.  Left Heart Catheterization: 5Fr TIG Catheter advanced or exchanged over a J-wire under direct fluoroscopic guidance into the ascending aorta - used to engage the coronary arteries & across the Aortic Valve for LV Hemodynamics & had injection LV Gram). Left & Right Coronary Artery Cineangiography, LV Hemodynamics & LV gram: TIG 4.0 Catheter   Brachial Sheath(s) removed in the PACU holding area with manual pressure for hemostasis.   Radial sheath removed in the Cardiac Catheterization lab with TR Band placed for hemostasis.  TR Band: 0845 Hours; 12 mL air    Coronary Findings    Dominance: Right   Left Main  Vessel was injected. Vessel is large. Vessel is  angiographically normal. TR 4 Catheter     Left Anterior Descending  . Vessel is large. The vessel exhibits minimal luminal irregularities.   . First Diagonal Branch   The vessel is normal in size.   . Lateral First Diagonal Branch   The vessel is moderate in size.   . First Septal Branch   The vessel is small in size.   Marland Kitchen Second Diagonal Branch   The vessel is moderate in size.   Marland Kitchen Second  Septal Branch   The vessel is small in size.   . Third Diagonal Branch   The vessel is small in size.     Left Circumflex   . Mid Cx lesion, 45% stenosed. Tubular eccentric.   . First Obtuse Marginal Branch   The vessel is moderate in size and exhibits minimal luminal irregularities.   . Second Obtuse Marginal Branch   The vessel is small in size.   . Lateral Third Obtuse Marginal Branch   The vessel is small in size.     Right Coronary Artery  . Vessel is large.   . Prox RCA lesion, 30% stenosed. Diffuse tubular eccentric.   Marland Kitchen Acute Marginal Branch   The vessel is moderate in size.   . Right Posterior Descending Artery   The vessel is moderate in size and exhibits minimal luminal irregularities.   . Inferior Septal   The vessel is small in size.   . Right Posterior Atrioventricular Branch   The vessel is moderate in size.   . First Right Posterolateral   The vessel is small in size.       Right Heart Pressures Hemodynamic findings consistent with mitral valve regurgitation. LV EDP is normal. There does not appear to be a large V wave in PA waveform.    Left Heart    Left Ventricle The left ventricular size is normal. The left ventricular systolic function is normal. The left ventricular ejection fraction is 55-65% by visual estimate. The ejection fraction could not be assessed due to underfilling.   Mitral Valve There is mild (2+) mitral regurgitation. Hard to truly visualize    Aortic Valve There is no aortic valve stenosis, and no aortic valve regurgitation.    Coronary Diagrams    Diagnostic Diagram            Implants    Name ID Temporary Type Supply   No information to display    PACS Images    Show images for Cardiac catheterization     Link to Procedure Log    Procedure Log      Hemo Data       Most Recent Value   Fick Cardiac Output  5.45 L/min   Fick Cardiac Output Index  2.56 (L/min)/BSA   RA A Wave  8 mmHg   RA V Wave  8 mmHg   RA Mean  7 mmHg   RV Systolic Pressure  26 mmHg   RV Diastolic Pressure  7 mmHg   RV EDP  9 mmHg   PA Systolic Pressure  20 mmHg   PA Diastolic Pressure  4 mmHg   PA Mean  13 mmHg   PW A Wave  10 mmHg   PW V Wave  9 mmHg   PW Mean  7 mmHg   AO Systolic Pressure  017 mmHg   AO Diastolic Pressure  70 mmHg   AO Mean  84 mmHg   LV Systolic Pressure  96 mmHg   LV Diastolic Pressure  1 mmHg   LV EDP  5 mmHg   Arterial Occlusion Pressure Extended Systolic Pressure  494 mmHg   Arterial Occlusion Pressure Extended Diastolic Pressure  72 mmHg   Arterial Occlusion Pressure Extended Mean Pressure  87 mmHg   Left Ventricular Apex Extended Systolic Pressure  496 mmHg   Left Ventricular Apex Extended Diastolic Pressure  1 mmHg   Left Ventricular Apex Extended EDP Pressure  7 mmHg   QP/QS  1  TPVR Index  5.08 HRUI   TSVR Index  32.83 HRUI   PVR SVR Ratio  0.08   TPVR/TSVR Ratio  0.15     CT ANGIOGRAPHY CHEST, ABDOMEN AND PELVIS  TECHNIQUE: Multidetector CT imaging through the chest, abdomen and pelvis was performed using the standard protocol during bolus administration of intravenous contrast. Multiplanar reconstructed images and MIPs were obtained and reviewed to evaluate the vascular  anatomy.  CONTRAST: 75 mL Isovue 370  COMPARISON: Abdominal CT 06/13/2014  FINDINGS: CTA CHEST FINDINGS  Vascular structures: The mid ascending thoracic aorta measures up to 3.7 cm without evidence of a dissection. Great vessels are patent. Normal caliber of the descending thoracic aorta. No significant atherosclerotic disease in the thoracic aorta. Pulmonary arteries are not opacified. Main pulmonary artery is prominent for size measuring up to 3.9 cm. Heart is slightly enlarged. No pericardial effusion.  Nonvascular structures: No gross abnormality to the visualized thyroid tissue. Prevascular lymph node tissue measures 0.9 cm. Overall, no significant chest lymphadenopathy. Small axillary lymph nodes bilaterally. Trachea and mainstem bronchi are patent. Mild centrilobular emphysema at the lung apices. 3 mm nodule along the anterior right minor fissure on sequence 5, image 29. The lungs are clear without airspace disease or large areas of consolidation. No acute bone abnormality.  Review of the MIP images confirms the above findings.  CTA ABDOMEN AND PELVIS FINDINGS  Vascular structures: Normal caliber of the abdominal aorta without aneurysm. There is minimal calcified plaque involving the infrarenal abdominal aorta. The celiac trunk, SMA and IMA are patent. Main branch vessels to the celiac trunk are patent. Bilateral renal arteries are patent. There are accessory inferior renal arteries bilaterally. Normal caliber of the iliac arteries bilaterally without stenosis. There is minimal calcified plaque in the right internal iliac artery. Proximal femoral arteries are widely patent bilaterally. Mild tortuosity of the common and external iliac arteries.  Nonvascular structures: No acute abnormality to the liver or gallbladder. Normal appearance of the spleen and pancreas. Chronic fullness of the adrenal glands could represent hyperplasia. Mild malrotation of both  kidneys without hydronephrosis. There is an extrarenal pelvis involving both kidneys. Normal appearance of the bowel structures without obstruction. No gross abnormality to the prostate, seminal vesicles or urinary bladder. There is a small amount of free fluid in the pelvis which is new from the prior examination. No acute abnormality to the appendix. Grade 1 anterolisthesis at L4-L5 appears to be secondary to severe facet arthropathy. Mild deformity along the superior endplate of L1 appears chronic.  Review of the MIP images confirms the above findings.  IMPRESSION: Minimal atherosclerotic disease in the chest, abdomen or pelvis. Mild tortuosity of the iliac vessels.  Small amount of free fluid in the pelvis of unknown etiology. An inflammatory source in the abdomen and pelvis is not clearly identified.  Accessory renal arteries as described.  3 mm nodule along the right minor fissure is nonspecific. Given risk factors for bronchogenic carcinoma, follow-up chest CT at 1 year is recommended. This recommendation follows the consensus statement: Guidelines for Management of Small Pulmonary Nodules Detected on CT Scans: A Statement from the [place] as published in Radiology 2005; 237:395-400.   Electronically Signed  By: Markus Daft M.D.  On: 11/10/2015 14:20          Impression:  Patient has stage D severe symptomatic primary mitral regurgitation. He was recently hospitalized with symptoms of acute diastolic congestive heart failure that have improved considerably with medical therapy. I have personally reviewed the  patient's recent echocardiograms and diagnostic cardiac catheterization. Transesophageal echocardiogram demonstrates the presence of moderately thickened leaflets of the mitral valve with severely restricted leaflet mobility involving the posterior leaflet and moderate foreshortening of the subvalvular apparatus causing type IIIA dysfunction  with severe mitral regurgitation. Left ventricular systolic function appears reasonably well preserved. Diagnostic cardiac catheterization is notable for absence of significant coronary artery disease and relatively normal pulmonary artery pressures. I agree the patient would best be treated with elective mitral valve repair or replacement. There remains a possibility that his valve might be repairable, although in the setting of likely rheumatic disease the likelihood of repair is reduced and the potential durability following successful repair probably muted. The patient appears to be relatively good candidate for minimally invasive approach for surgery. Preoperative CT scan of the chest reveals a 3 mm nodule in the right lung that appears relatively low risk but probably requires follow-up CT scan in 1 year to ensure stability.   Plan:  The patient was again counseled at length regarding the indications, risks and potential benefits of mitral valve repair. The rationale for elective surgery has been explained, including a comparison between surgery and continued medical therapy with close follow-up. The likelihood of successful and durable valve repair has been discussed with particular reference to the findings of their recent echocardiogram. Based upon these findings and previous experience, I have quoted them a 50 percent likelihood of successful valve repair. In the event that their valve cannot be successfully repaired, we discussed the possibility of replacing the mitral valve using a mechanical prosthesis with the attendant need for long-term anticoagulation versus the alternative of replacing it using a bioprosthetic tissue valve with its potential for late structural valve deterioration and failure, depending upon the patient's longevity. The patient specifically requests that if the mitral valve must be replaced that it be done using a mechanical valve. The patient understands and accepts  all potential risks of surgery including but not limited to risk of death, stroke or other neurologic complication, myocardial infarction, congestive heart failure, respiratory failure, renal failure, bleeding requiring transfusion and/or reexploration, arrhythmia, infection or other wound complications, pneumonia, pleural and/or pericardial effusion, pulmonary embolus, aortic dissection or other major vascular complication, or delayed complications related to valve repair or replacement including but not limited to structural valve deterioration and failure, thrombosis, embolization, endocarditis, or paravalvular leak. Alternative surgical approaches have been discussed including a comparison between conventional sternotomy and minimally-invasive techniques. The relative risks and benefits of each have been reviewed as they pertain to the patient's specific circumstances, and all of their questions have been addressed. Specific risks potentially related to the minimally-invasive approach were discussed at length, including but not limited to risk of conversion to full or partial sternotomy, aortic dissection or other major vascular complication, unilateral acute lung injury or pulmonary edema, phrenic nerve dysfunction or paralysis, rib fracture, chronic pain, lung hernia, or lymphocele. All of their questions have been answered.  I spent in excess of 15 minutes during the conduct of this office consultation and >50% of this time involved direct face-to-face encounter with the patient for counseling and/or coordination of their care.   Valentina Gu. Roxy Manns, MD 11/10/2015 2:37 PM

## 2015-11-10 NOTE — Patient Instructions (Addendum)
Begin taking amiodarone 1 tablet by mouth twice daily with meals  Continue taking all other medications without change through the day before surgery.  Have nothing to eat or drink after midnight the night before surgery.  On the morning of surgery take only amiodarone with a sip of water.  Do not take Lasix (furosemide) on the morning of surgery

## 2015-11-10 NOTE — Pre-Procedure Instructions (Addendum)
Oscar Brown  11/10/2015      [place], [place] - [address] [place] [address] Phone: [phone] Fax: [phone]    Your procedure is scheduled on Thurs, Dec 15 @ 7:30 AM  Report to [facility] Admitting at 5:30 AM  Call this number if you have problems the morning of surgery:  [phone]   Remember:  Do not eat food or drink liquids after midnight.  Take these medicines the morning of surgery with A SIP OF WATER Amiodarone(Pacerone)              No Goody's,BC's,Aleve,Aspirin,Ibuprofen,Motrin,Advil,Fish Oil,or any Herbal Medications.    Do not wear jewelry.  Do not wear lotions, powders, or colognes.    Men may shave face and neck.  Do not bring valuables to the hospital.  [facility] is not responsible for any belongings or valuables.  Contacts, dentures or bridgework may not be worn into surgery.  Leave your suitcase in the car.  After surgery it may be brought to your room.  For patients admitted to the hospital, discharge time will be determined by your treatment team.  Patients discharged the day of surgery will not be allowed to drive home.    Special instructions:  [facility] - Preparing for Surgery  Before surgery, you can play an important role.  Because skin is not sterile, your skin needs to be as free of germs as possible.  You can reduce the number of germs on you skin by washing with CHG (chlorahexidine gluconate) soap before surgery.  CHG is an antiseptic cleaner which kills germs and bonds with the skin to continue killing germs even after washing.  Please DO NOT use if you have an allergy to CHG or antibacterial soaps.  If your skin becomes reddened/irritated stop using the CHG and inform your nurse when you arrive at Short Stay.  Do not shave (including legs and underarms) for at least 48 hours prior to the first CHG shower.  You may shave your face.  Please  follow these instructions carefully:   1.  Shower with CHG Soap the night before surgery and the                                morning of Surgery.  2.  If you choose to wash your hair, wash your hair first as usual with your       normal shampoo.  3.  After you shampoo, rinse your hair and body thoroughly to remove the                      Shampoo.  4.  Use CHG as you would any other liquid soap.  You can apply chg directly       to the skin and wash gently with scrungie or a clean washcloth.  5.  Apply the CHG Soap to your body ONLY FROM THE NECK DOWN.        Do not use on open wounds or open sores.  Avoid contact with your eyes,       ears, mouth and genitals (private parts).  Wash genitals (private parts)       with your normal soap.  6.  Wash thoroughly, paying special attention to the area where your surgery  will be performed.  7.  Thoroughly rinse your body with warm water from the neck down.  8.  DO NOT shower/wash with your normal soap after using and rinsing off       the CHG Soap.  9.  Pat yourself dry with a clean towel.            10.  Wear clean pajamas.            11.  Place clean sheets on your bed the night of your first shower and do not        sleep with pets.  Day of Surgery  Do not apply any lotions/deoderants the morning of surgery.  Please wear clean clothes to the hospital/surgery center.    Please read over the following fact sheets that you were given. Pain Booklet, Coughing and Deep Breathing, Blood Transfusion Information, MRSA Information and Surgical Site Infection Prevention

## 2015-11-11 ENCOUNTER — Encounter (HOSPITAL_COMMUNITY): Payer: Self-pay

## 2015-11-11 ENCOUNTER — Ambulatory Visit (HOSPITAL_BASED_OUTPATIENT_CLINIC_OR_DEPARTMENT_OTHER)
Admission: RE | Admit: 2015-11-11 | Discharge: 2015-11-11 | Disposition: A | Discharge status: Home / Self Care | Payer: Managed Care, Other (non HMO) | Source: Ambulatory Visit | Attending: Thoracic Surgery (Cardiothoracic Vascular Surgery) | Admitting: Thoracic Surgery (Cardiothoracic Vascular Surgery)

## 2015-11-11 ENCOUNTER — Encounter (HOSPITAL_COMMUNITY)
Admission: RE | Admit: 2015-11-11 | Discharge: 2015-11-11 | Disposition: A | Discharge status: Home / Self Care | Payer: Managed Care, Other (non HMO) | Source: Ambulatory Visit | Attending: Thoracic Surgery (Cardiothoracic Vascular Surgery) | Admitting: Thoracic Surgery (Cardiothoracic Vascular Surgery)

## 2015-11-11 ENCOUNTER — Ambulatory Visit: Payer: Managed Care, Other (non HMO) | Admitting: Thoracic Surgery (Cardiothoracic Vascular Surgery)

## 2015-11-11 ENCOUNTER — Ambulatory Visit (HOSPITAL_COMMUNITY)
Admission: RE | Admit: 2015-11-11 | Discharge: 2015-11-11 | Disposition: A | Discharge status: Home / Self Care | Payer: Managed Care, Other (non HMO) | Source: Ambulatory Visit | Attending: Thoracic Surgery (Cardiothoracic Vascular Surgery) | Admitting: Thoracic Surgery (Cardiothoracic Vascular Surgery)

## 2015-11-11 VITALS — BP 109/65 | HR 52 | Temp 98.4°F | Resp 18 | Ht 68.0 in | Wt 219.0 lb

## 2015-11-11 DIAGNOSIS — Z0183 Encounter for blood typing: Secondary | ICD-10-CM | POA: Diagnosis not present

## 2015-11-11 DIAGNOSIS — I340 Nonrheumatic mitral (valve) insufficiency: Secondary | ICD-10-CM

## 2015-11-11 DIAGNOSIS — Z01818 Encounter for other preprocedural examination: Secondary | ICD-10-CM | POA: Diagnosis not present

## 2015-11-11 DIAGNOSIS — Z01812 Encounter for preprocedural laboratory examination: Secondary | ICD-10-CM | POA: Diagnosis not present

## 2015-11-11 DIAGNOSIS — I6523 Occlusion and stenosis of bilateral carotid arteries: Secondary | ICD-10-CM | POA: Insufficient documentation

## 2015-11-11 LAB — URINALYSIS, ROUTINE W REFLEX MICROSCOPIC
BILIRUBIN URINE: NEGATIVE
Glucose, UA: NEGATIVE mg/dL
Hgb urine dipstick: NEGATIVE
Ketones, ur: NEGATIVE mg/dL
LEUKOCYTES UA: NEGATIVE
NITRITE: NEGATIVE
Protein, ur: NEGATIVE mg/dL
SPECIFIC GRAVITY, URINE: 1.015 (ref 1.005–1.030)
pH: 6 (ref 5.0–8.0)

## 2015-11-11 LAB — BLOOD GAS, ARTERIAL
Acid-Base Excess: 1.7 mmol/L (ref 0.0–2.0)
BICARBONATE: 25.7 meq/L — AB (ref 20.0–24.0)
Drawn by: 449841
FIO2: 0.21
O2 Saturation: 96.9 %
PATIENT TEMPERATURE: 98.6
PCO2 ART: 39.6 mmHg (ref 35.0–45.0)
PH ART: 7.428 (ref 7.350–7.450)
TCO2: 26.9 mmol/L (ref 0–100)
pO2, Arterial: 92.5 mmHg (ref 80.0–100.0)

## 2015-11-11 LAB — COMPREHENSIVE METABOLIC PANEL
ALBUMIN: 3.3 g/dL — AB (ref 3.5–5.0)
ALK PHOS: 57 U/L (ref 38–126)
ALT: 13 U/L — ABNORMAL LOW (ref 17–63)
AST: 16 U/L (ref 15–41)
Anion gap: 8 (ref 5–15)
BILIRUBIN TOTAL: 0.8 mg/dL (ref 0.3–1.2)
BUN: 20 mg/dL (ref 6–20)
CALCIUM: 9.3 mg/dL (ref 8.9–10.3)
CO2: 25 mmol/L (ref 22–32)
CREATININE: 1.05 mg/dL (ref 0.61–1.24)
Chloride: 103 mmol/L (ref 101–111)
GFR calc Af Amer: >60 mL/min (ref >60)
GFR calc non Af Amer: >60 mL/min (ref >60)
GLUCOSE: 95 mg/dL (ref 65–99)
Potassium: 3.8 mmol/L (ref 3.5–5.1)
Sodium: 136 mmol/L (ref 135–145)
TOTAL PROTEIN: 6.3 g/dL — AB (ref 6.5–8.1)

## 2015-11-11 LAB — PULMONARY FUNCTION TEST
DL/VA % PRED: 109 %
DL/VA: 4.93 ml/min/mmHg/L
DLCO COR % PRED: 95 %
DLCO cor: 28.4 ml/min/mmHg
DLCO unc % pred: 93 %
DLCO unc: 27.83 ml/min/mmHg
FEF 25-75 POST: 3.49 L/s
FEF 25-75 Pre: 3.15 L/sec
FEF2575-%Change-Post: 10 %
FEF2575-%Pred-Post: 109 %
FEF2575-%Pred-Pre: 99 %
FEV1-%CHANGE-POST: 5 %
FEV1-%Pred-Post: 107 %
FEV1-%Pred-Pre: 102 %
FEV1-Post: 3.38 L
FEV1-Pre: 3.2 L
FEV1FVC-%Change-Post: 0 %
FEV1FVC-%PRED-PRE: 99 %
FEV6-%Change-Post: 5 %
FEV6-%PRED-PRE: 105 %
FEV6-%Pred-Post: 111 %
FEV6-POST: 4.25 L
FEV6-Pre: 4.02 L
FEV6FVC-%Change-Post: 0 %
FEV6FVC-%PRED-POST: 102 %
FEV6FVC-%Pred-Pre: 102 %
FVC-%CHANGE-POST: 6 %
FVC-%PRED-POST: 109 %
FVC-%PRED-PRE: 102 %
FVC-POST: 4.27 L
FVC-PRE: 4.03 L
PRE FEV6/FVC RATIO: 100 %
Post FEV1/FVC ratio: 79 %
Post FEV6/FVC ratio: 100 %
Pre FEV1/FVC ratio: 80 %
RV % pred: 93 %
RV: 1.79 L
TLC % PRED: 89 %
TLC: 5.9 L

## 2015-11-11 LAB — PROTIME-INR
INR: 1.11 (ref 0.00–1.49)
Prothrombin Time: 14.5 seconds (ref 11.6–15.2)

## 2015-11-11 LAB — CBC
HEMATOCRIT: 41.2 % (ref 39.0–52.0)
HEMOGLOBIN: 13.9 g/dL (ref 13.0–17.0)
MCH: 29.6 pg (ref 26.0–34.0)
MCHC: 33.7 g/dL (ref 30.0–36.0)
MCV: 87.7 fL (ref 78.0–100.0)
Platelets: 171 10*3/uL (ref 150–400)
RBC: 4.7 MIL/uL (ref 4.22–5.81)
RDW: 12.4 % (ref 11.5–15.5)
WBC: 9 10*3/uL (ref 4.0–10.5)

## 2015-11-11 LAB — SURGICAL PCR SCREEN
MRSA, PCR: NEGATIVE
STAPHYLOCOCCUS AUREUS: NEGATIVE

## 2015-11-11 LAB — ABO/RH: ABO/RH(D): B POS

## 2015-11-11 LAB — APTT: aPTT: 27 seconds (ref 24–37)

## 2015-11-11 MED ORDER — ALBUTEROL SULFATE (2.5 MG/3ML) 0.083% IN NEBU
2.5000 mg | INHALATION_SOLUTION | Freq: Once | RESPIRATORY_TRACT | Status: AC
  Administered 2015-11-11: 2.5 mg via RESPIRATORY_TRACT

## 2015-11-11 MED ORDER — CHLORHEXIDINE GLUCONATE 4 % EX LIQD: 30.0000 mL | CUTANEOUS | Status: DC

## 2015-11-11 NOTE — Progress Notes (Signed)
VASCULAR LAB PRELIMINARY  PRELIMINARY  PRELIMINARY  PRELIMINARY  Pre-op Cardiac Surgery  Carotid Findings:  Bilateral:  1-39% ICA stenosis.  Vertebral artery flow is antegrade.      Upper Extremity Right Left  Brachial Pressures 127  Triphasic  129  Triphasic   Radial Waveforms Triphasic  Triphasic   Ulnar Waveforms Triphasic  Triphasic   Palmar Arch (Allen's Test) Within normal limits  Within normal limits      Deone Omahoney, RVT 11/11/2015, 11:50 AM

## 2015-11-11 NOTE — Progress Notes (Addendum)
Cardiologist is [facility] is Dr.Lawrence Kilma  Echo and heart cath report in epic from 2016  Stress test denies ever having one

## 2015-11-11 NOTE — Anesthesia Postprocedure Evaluation (Signed)
Anesthesia Post Note  Patient: Oscar Brown  Procedure(s) Performed: Procedure(s) (LRB): Extraction of tooth #'s 1,2,8,9,12,15,16,17, 32 with alveoloplasty and gross debridement of remaining teeth. (N/A)  Patient location during evaluation: PACU Anesthesia Type: General Level of consciousness: awake Pain management: pain level controlled Vital Signs Assessment: post-procedure vital signs reviewed and stable Respiratory status: spontaneous breathing Cardiovascular status: stable Postop Assessment: no signs of nausea or vomiting Anesthetic complications: no    Last Vitals:  Filed Vitals:   11/07/15 1412 11/07/15 1415  BP: 132/72 125/72  Pulse: 93 56  Temp:    Resp: 15 24    Last Pain:  Filed Vitals:   11/07/15 1425  PainSc: 0-No pain                 Deyanna Mctier

## 2015-11-12 LAB — TYPE AND SCREEN
ABO/RH(D): B POS
Antibody Screen: NEGATIVE

## 2015-11-12 LAB — HEMOGLOBIN A1C
HEMOGLOBIN A1C: 5.7 % — AB (ref 4.8–5.6)
Mean Plasma Glucose: 117 mg/dL

## 2015-11-12 MED ORDER — CHLORHEXIDINE GLUCONATE 0.12 % MT SOLN
15.0000 mL | OROMUCOSAL | Status: AC
  Administered 2015-11-13: 15 mL via OROMUCOSAL
  Filled 2015-11-12: qty 15

## 2015-11-12 MED ORDER — NITROGLYCERIN IN D5W 200-5 MCG/ML-% IV SOLN
2.0000 ug/min | INTRAVENOUS | Status: AC
  Administered 2015-11-13: 5 ug/min via INTRAVENOUS
  Filled 2015-11-12: qty 250

## 2015-11-12 MED ORDER — SODIUM CHLORIDE 0.9 % IV SOLN
INTRAVENOUS | Status: DC
  Filled 2015-11-12: qty 30

## 2015-11-12 MED ORDER — SODIUM CHLORIDE 0.9 % IV SOLN
INTRAVENOUS | Status: AC
  Administered 2015-11-13: 69.8 mL/h via INTRAVENOUS
  Filled 2015-11-12: qty 40

## 2015-11-12 MED ORDER — METOPROLOL TARTRATE 12.5 MG HALF TABLET
12.5000 mg | ORAL_TABLET | Freq: Once | ORAL | Status: AC
  Administered 2015-11-13: 12.5 mg via ORAL
  Filled 2015-11-12: qty 1

## 2015-11-12 MED ORDER — SODIUM CHLORIDE 0.9 % IV SOLN
INTRAVENOUS | Status: AC
  Administered 2015-11-13: 1000 mL
  Filled 2015-11-12: qty 1000

## 2015-11-12 MED ORDER — PHENYLEPHRINE HCL 10 MG/ML IJ SOLN
30.0000 ug/min | INTRAVENOUS | Status: AC
  Administered 2015-11-13: 10 ug/min via INTRAVENOUS
  Filled 2015-11-12: qty 2

## 2015-11-12 MED ORDER — DEXTROSE 5 % IV SOLN
750.0000 mg | INTRAVENOUS | Status: DC
  Filled 2015-11-12: qty 750

## 2015-11-12 MED ORDER — DEXMEDETOMIDINE HCL IN NACL 400 MCG/100ML IV SOLN
0.1000 ug/kg/h | INTRAVENOUS | Status: AC
Start: 2015-11-13 — End: 2015-11-13
  Administered 2015-11-13: .3 ug/kg/h via INTRAVENOUS
  Filled 2015-11-12: qty 100

## 2015-11-12 MED ORDER — SODIUM CHLORIDE 0.9 % IV SOLN
INTRAVENOUS | Status: AC
  Administered 2015-11-13: 1 [IU]/h via INTRAVENOUS
  Filled 2015-11-12: qty 2.5

## 2015-11-12 MED ORDER — MAGNESIUM SULFATE 50 % IJ SOLN
40.0000 meq | INTRAMUSCULAR | Status: DC
  Filled 2015-11-12: qty 10

## 2015-11-12 MED ORDER — EPINEPHRINE HCL 1 MG/ML IJ SOLN
0.0000 ug/min | INTRAVENOUS | Status: DC
  Filled 2015-11-12: qty 4

## 2015-11-12 MED ORDER — GLUTARALDEHYDE 0.625% SOAKING SOLUTION
TOPICAL | Status: DC | PRN
  Administered 2015-11-13: 1 via TOPICAL
  Filled 2015-11-12 (×2): qty 50

## 2015-11-12 MED ORDER — POTASSIUM CHLORIDE 2 MEQ/ML IV SOLN
80.0000 meq | INTRAVENOUS | Status: DC
  Filled 2015-11-12: qty 40

## 2015-11-12 MED ORDER — VANCOMYCIN HCL 10 G IV SOLR
1500.0000 mg | INTRAVENOUS | Status: AC
  Administered 2015-11-13: 1500 mg via INTRAVENOUS
  Filled 2015-11-12: qty 1500

## 2015-11-12 MED ORDER — PLASMA-LYTE 148 IV SOLN
INTRAVENOUS | Status: AC
  Administered 2015-11-13: 500 mL
  Filled 2015-11-12: qty 2.5

## 2015-11-12 MED ORDER — DEXTROSE 5 % IV SOLN
1.5000 g | INTRAVENOUS | Status: AC
  Administered 2015-11-13: .75 g via INTRAVENOUS
  Administered 2015-11-13: 1.5 g via INTRAVENOUS
  Filled 2015-11-12 (×2): qty 1.5

## 2015-11-12 MED ORDER — DOPAMINE-DEXTROSE 3.2-5 MG/ML-% IV SOLN
0.0000 ug/kg/min | INTRAVENOUS | Status: DC
  Filled 2015-11-12: qty 250

## 2015-11-13 ENCOUNTER — Encounter (HOSPITAL_COMMUNITY): Payer: Self-pay | Admitting: Thoracic Surgery (Cardiothoracic Vascular Surgery)

## 2015-11-13 ENCOUNTER — Inpatient Hospital Stay (HOSPITAL_COMMUNITY): Payer: Managed Care, Other (non HMO)

## 2015-11-13 ENCOUNTER — Encounter (HOSPITAL_COMMUNITY)
Admission: RE | Disposition: A | Discharge status: Home / Self Care | Payer: Managed Care, Other (non HMO) | Source: Ambulatory Visit | Attending: Thoracic Surgery (Cardiothoracic Vascular Surgery)

## 2015-11-13 ENCOUNTER — Inpatient Hospital Stay (HOSPITAL_COMMUNITY)
Admission: RE | Admit: 2015-11-13 | Discharge: 2015-12-01 | DRG: 219 | Disposition: A | Discharge status: Home / Self Care | Payer: Managed Care, Other (non HMO) | Source: Ambulatory Visit | Attending: Thoracic Surgery (Cardiothoracic Vascular Surgery) | Admitting: Thoracic Surgery (Cardiothoracic Vascular Surgery)

## 2015-11-13 ENCOUNTER — Inpatient Hospital Stay (HOSPITAL_COMMUNITY): Payer: Managed Care, Other (non HMO) | Admitting: Anesthesiology

## 2015-11-13 ENCOUNTER — Inpatient Hospital Stay (HOSPITAL_COMMUNITY)
Admission: RE | Admit: 2015-11-13 | Discharge: 2015-11-13 | Disposition: A | Discharge status: Home / Self Care | Payer: Managed Care, Other (non HMO) | Source: Ambulatory Visit | Attending: Thoracic Surgery (Cardiothoracic Vascular Surgery) | Admitting: Thoracic Surgery (Cardiothoracic Vascular Surgery)

## 2015-11-13 DIAGNOSIS — J9811 Atelectasis: Secondary | ICD-10-CM | POA: Diagnosis not present

## 2015-11-13 DIAGNOSIS — I340 Nonrheumatic mitral (valve) insufficiency: Secondary | ICD-10-CM | POA: Diagnosis present

## 2015-11-13 DIAGNOSIS — R569 Unspecified convulsions: Secondary | ICD-10-CM | POA: Diagnosis not present

## 2015-11-13 DIAGNOSIS — D62 Acute posthemorrhagic anemia: Secondary | ICD-10-CM | POA: Diagnosis not present

## 2015-11-13 DIAGNOSIS — I4891 Unspecified atrial fibrillation: Secondary | ICD-10-CM | POA: Diagnosis not present

## 2015-11-13 DIAGNOSIS — Z86718 Personal history of other venous thrombosis and embolism: Secondary | ICD-10-CM

## 2015-11-13 DIAGNOSIS — I4892 Unspecified atrial flutter: Secondary | ICD-10-CM | POA: Diagnosis not present

## 2015-11-13 DIAGNOSIS — I2119 ST elevation (STEMI) myocardial infarction involving other coronary artery of inferior wall: Secondary | ICD-10-CM | POA: Diagnosis not present

## 2015-11-13 DIAGNOSIS — Z8674 Personal history of sudden cardiac arrest: Secondary | ICD-10-CM | POA: Diagnosis present

## 2015-11-13 DIAGNOSIS — E874 Mixed disorder of acid-base balance: Secondary | ICD-10-CM | POA: Diagnosis not present

## 2015-11-13 DIAGNOSIS — J189 Pneumonia, unspecified organism: Secondary | ICD-10-CM

## 2015-11-13 DIAGNOSIS — G4733 Obstructive sleep apnea (adult) (pediatric): Secondary | ICD-10-CM | POA: Diagnosis present

## 2015-11-13 DIAGNOSIS — I5021 Acute systolic (congestive) heart failure: Secondary | ICD-10-CM | POA: Diagnosis not present

## 2015-11-13 DIAGNOSIS — N170 Acute kidney failure with tubular necrosis: Secondary | ICD-10-CM | POA: Diagnosis not present

## 2015-11-13 DIAGNOSIS — E873 Alkalosis: Secondary | ICD-10-CM | POA: Diagnosis not present

## 2015-11-13 DIAGNOSIS — I890 Lymphedema, not elsewhere classified: Secondary | ICD-10-CM | POA: Diagnosis present

## 2015-11-13 DIAGNOSIS — I4901 Ventricular fibrillation: Secondary | ICD-10-CM | POA: Diagnosis not present

## 2015-11-13 DIAGNOSIS — N179 Acute kidney failure, unspecified: Secondary | ICD-10-CM | POA: Diagnosis not present

## 2015-11-13 DIAGNOSIS — I509 Heart failure, unspecified: Secondary | ICD-10-CM

## 2015-11-13 DIAGNOSIS — I5032 Chronic diastolic (congestive) heart failure: Secondary | ICD-10-CM | POA: Diagnosis not present

## 2015-11-13 DIAGNOSIS — I462 Cardiac arrest due to underlying cardiac condition: Secondary | ICD-10-CM | POA: Diagnosis not present

## 2015-11-13 DIAGNOSIS — I5033 Acute on chronic diastolic (congestive) heart failure: Secondary | ICD-10-CM | POA: Diagnosis present

## 2015-11-13 DIAGNOSIS — E876 Hypokalemia: Secondary | ICD-10-CM | POA: Diagnosis not present

## 2015-11-13 DIAGNOSIS — J9601 Acute respiratory failure with hypoxia: Secondary | ICD-10-CM | POA: Diagnosis not present

## 2015-11-13 DIAGNOSIS — Z952 Presence of prosthetic heart valve: Secondary | ICD-10-CM

## 2015-11-13 DIAGNOSIS — I10 Essential (primary) hypertension: Secondary | ICD-10-CM | POA: Diagnosis present

## 2015-11-13 DIAGNOSIS — K7200 Acute and subacute hepatic failure without coma: Secondary | ICD-10-CM | POA: Diagnosis not present

## 2015-11-13 DIAGNOSIS — Z954 Presence of other heart-valve replacement: Secondary | ICD-10-CM | POA: Diagnosis not present

## 2015-11-13 DIAGNOSIS — I272 Other secondary pulmonary hypertension: Secondary | ICD-10-CM | POA: Diagnosis present

## 2015-11-13 DIAGNOSIS — Z6832 Body mass index (BMI) 32.0-32.9, adult: Secondary | ICD-10-CM

## 2015-11-13 DIAGNOSIS — E871 Hypo-osmolality and hyponatremia: Secondary | ICD-10-CM | POA: Diagnosis not present

## 2015-11-13 DIAGNOSIS — I469 Cardiac arrest, cause unspecified: Secondary | ICD-10-CM | POA: Diagnosis not present

## 2015-11-13 DIAGNOSIS — Z87891 Personal history of nicotine dependence: Secondary | ICD-10-CM | POA: Diagnosis not present

## 2015-11-13 DIAGNOSIS — R911 Solitary pulmonary nodule: Secondary | ICD-10-CM | POA: Diagnosis present

## 2015-11-13 DIAGNOSIS — I442 Atrioventricular block, complete: Secondary | ICD-10-CM | POA: Diagnosis not present

## 2015-11-13 DIAGNOSIS — I255 Ischemic cardiomyopathy: Secondary | ICD-10-CM | POA: Diagnosis present

## 2015-11-13 DIAGNOSIS — I2510 Atherosclerotic heart disease of native coronary artery without angina pectoris: Secondary | ICD-10-CM | POA: Diagnosis present

## 2015-11-13 DIAGNOSIS — Z978 Presence of other specified devices: Secondary | ICD-10-CM

## 2015-11-13 DIAGNOSIS — D696 Thrombocytopenia, unspecified: Secondary | ICD-10-CM | POA: Diagnosis present

## 2015-11-13 DIAGNOSIS — E669 Obesity, unspecified: Secondary | ICD-10-CM | POA: Diagnosis present

## 2015-11-13 DIAGNOSIS — J690 Pneumonitis due to inhalation of food and vomit: Secondary | ICD-10-CM | POA: Diagnosis not present

## 2015-11-13 DIAGNOSIS — I480 Paroxysmal atrial fibrillation: Secondary | ICD-10-CM | POA: Diagnosis not present

## 2015-11-13 HISTORY — PX: TEE WITHOUT CARDIOVERSION: SHX5443

## 2015-11-13 HISTORY — DX: Presence of other heart-valve replacement: Z95.4

## 2015-11-13 HISTORY — DX: Acute kidney failure, unspecified: N17.9

## 2015-11-13 HISTORY — PX: MITRAL VALVE REPLACEMENT: SHX147

## 2015-11-13 LAB — CBC
HCT: 36.8 % — ABNORMAL LOW (ref 39.0–52.0)
HEMATOCRIT: 37.7 % — AB (ref 39.0–52.0)
HEMOGLOBIN: 13 g/dL (ref 13.0–17.0)
Hemoglobin: 12.4 g/dL — ABNORMAL LOW (ref 13.0–17.0)
MCH: 29.8 pg (ref 26.0–34.0)
MCH: 30.4 pg (ref 26.0–34.0)
MCHC: 33.7 g/dL (ref 30.0–36.0)
MCHC: 34.5 g/dL (ref 30.0–36.0)
MCV: 88.5 fL (ref 78.0–100.0)
MCV: 88.1 fL (ref 78.0–100.0)
PLATELETS: 115 10*3/uL — AB (ref 150–400)
Platelets: 136 10*3/uL — ABNORMAL LOW (ref 150–400)
RBC: 4.16 MIL/uL — AB (ref 4.22–5.81)
RBC: 4.28 MIL/uL (ref 4.22–5.81)
RDW: 12.7 % (ref 11.5–15.5)
RDW: 12.8 % (ref 11.5–15.5)
WBC: 8.6 10*3/uL (ref 4.0–10.5)
WBC: 8.2 10*3/uL (ref 4.0–10.5)

## 2015-11-13 LAB — POCT I-STAT, CHEM 8
BUN: 15 mg/dL (ref 6–20)
BUN: 15 mg/dL (ref 6–20)
BUN: 14 mg/dL (ref 6–20)
BUN: 14 mg/dL (ref 6–20)
BUN: 14 mg/dL (ref 6–20)
CALCIUM ION: 1.18 mmol/L (ref 1.12–1.23)
CHLORIDE: 102 mmol/L (ref 101–111)
CHLORIDE: 104 mmol/L (ref 101–111)
CHLORIDE: 103 mmol/L (ref 101–111)
CHLORIDE: 103 mmol/L (ref 101–111)
CHLORIDE: 103 mmol/L (ref 101–111)
CREATININE: 0.6 mg/dL — AB (ref 0.61–1.24)
CREATININE: 0.8 mg/dL (ref 0.61–1.24)
Calcium, Ion: 1.28 mmol/L — ABNORMAL HIGH (ref 1.12–1.23)
Calcium, Ion: 1.19 mmol/L (ref 1.12–1.23)
Calcium, Ion: 1.19 mmol/L (ref 1.12–1.23)
Calcium, Ion: 1.2 mmol/L (ref 1.12–1.23)
Creatinine, Ser: 0.7 mg/dL (ref 0.61–1.24)
Creatinine, Ser: 0.7 mg/dL (ref 0.61–1.24)
Creatinine, Ser: 0.6 mg/dL — ABNORMAL LOW (ref 0.61–1.24)
GLUCOSE: 132 mg/dL — AB (ref 65–99)
Glucose, Bld: 87 mg/dL (ref 65–99)
Glucose, Bld: 106 mg/dL — ABNORMAL HIGH (ref 65–99)
Glucose, Bld: 126 mg/dL — ABNORMAL HIGH (ref 65–99)
Glucose, Bld: 127 mg/dL — ABNORMAL HIGH (ref 65–99)
HCT: 39 % (ref 39.0–52.0)
HCT: 34 % — ABNORMAL LOW (ref 39.0–52.0)
HEMATOCRIT: 39 % (ref 39.0–52.0)
HEMATOCRIT: 33 % — AB (ref 39.0–52.0)
HEMATOCRIT: 33 % — AB (ref 39.0–52.0)
HEMOGLOBIN: 13.3 g/dL (ref 13.0–17.0)
HEMOGLOBIN: 11.2 g/dL — AB (ref 13.0–17.0)
Hemoglobin: 13.3 g/dL (ref 13.0–17.0)
Hemoglobin: 11.6 g/dL — ABNORMAL LOW (ref 13.0–17.0)
Hemoglobin: 11.2 g/dL — ABNORMAL LOW (ref 13.0–17.0)
POTASSIUM: 4.1 mmol/L (ref 3.5–5.1)
POTASSIUM: 5.1 mmol/L (ref 3.5–5.1)
POTASSIUM: 4.4 mmol/L (ref 3.5–5.1)
POTASSIUM: 5 mmol/L (ref 3.5–5.1)
Potassium: 4.4 mmol/L (ref 3.5–5.1)
SODIUM: 139 mmol/L (ref 135–145)
SODIUM: 138 mmol/L (ref 135–145)
SODIUM: 137 mmol/L (ref 135–145)
SODIUM: 138 mmol/L (ref 135–145)
Sodium: 138 mmol/L (ref 135–145)
TCO2: 27 mmol/L (ref 0–100)
TCO2: 24 mmol/L (ref 0–100)
TCO2: 29 mmol/L (ref 0–100)
TCO2: 25 mmol/L (ref 0–100)
TCO2: 27 mmol/L (ref 0–100)

## 2015-11-13 LAB — POCT I-STAT 3, ART BLOOD GAS (G3+)
ACID-BASE EXCESS: 1 mmol/L (ref 0.0–2.0)
Acid-Base Excess: 2 mmol/L (ref 0.0–2.0)
BICARBONATE: 25.1 meq/L — AB (ref 20.0–24.0)
BICARBONATE: 26.4 meq/L — AB (ref 20.0–24.0)
Bicarbonate: 27.9 mEq/L — ABNORMAL HIGH (ref 20.0–24.0)
O2 SAT: 100 %
O2 SAT: 99 %
O2 Saturation: 95 %
PCO2 ART: 48.1 mmHg — AB (ref 35.0–45.0)
PCO2 ART: 41.7 mmHg (ref 35.0–45.0)
PH ART: 7.372 (ref 7.350–7.450)
PH ART: 7.364 (ref 7.350–7.450)
PO2 ART: 457 mmHg — AB (ref 80.0–100.0)
PO2 ART: 67 mmHg — AB (ref 80.0–100.0)
Patient temperature: 35.1
TCO2: 29 mmol/L (ref 0–100)
TCO2: 26 mmol/L (ref 0–100)
TCO2: 28 mmol/L (ref 0–100)
pCO2 arterial: 44 mmHg (ref 35.0–45.0)
pH, Arterial: 7.4 (ref 7.350–7.450)
pO2, Arterial: 122 mmHg — ABNORMAL HIGH (ref 80.0–100.0)

## 2015-11-13 LAB — PROTIME-INR
INR: 1.36 (ref 0.00–1.49)
Prothrombin Time: 16.9 seconds — ABNORMAL HIGH (ref 11.6–15.2)

## 2015-11-13 LAB — HEMOGLOBIN AND HEMATOCRIT, BLOOD
HEMATOCRIT: 33.8 % — AB (ref 39.0–52.0)
Hemoglobin: 11.4 g/dL — ABNORMAL LOW (ref 13.0–17.0)

## 2015-11-13 LAB — GLUCOSE, CAPILLARY
GLUCOSE-CAPILLARY: 114 mg/dL — AB (ref 65–99)
Glucose-Capillary: 120 mg/dL — ABNORMAL HIGH (ref 65–99)
Glucose-Capillary: 86 mg/dL (ref 65–99)
Glucose-Capillary: 96 mg/dL (ref 65–99)

## 2015-11-13 LAB — PLATELET COUNT: PLATELETS: 141 10*3/uL — AB (ref 150–400)

## 2015-11-13 LAB — CREATININE, SERUM
Creatinine, Ser: 0.96 mg/dL (ref 0.61–1.24)
GFR calc Af Amer: >60 mL/min (ref >60)
GFR calc non Af Amer: >60 mL/min (ref >60)

## 2015-11-13 LAB — MAGNESIUM: Magnesium: 2.6 mg/dL — ABNORMAL HIGH (ref 1.7–2.4)

## 2015-11-13 LAB — POCT I-STAT 4, (NA,K, GLUC, HGB,HCT)
Glucose, Bld: 85 mg/dL (ref 65–99)
HEMATOCRIT: 37 % — AB (ref 39.0–52.0)
HEMOGLOBIN: 12.6 g/dL — AB (ref 13.0–17.0)
POTASSIUM: 4.4 mmol/L (ref 3.5–5.1)
SODIUM: 139 mmol/L (ref 135–145)

## 2015-11-13 LAB — APTT: aPTT: 32 seconds (ref 24–37)

## 2015-11-13 SURGERY — ECHOCARDIOGRAM, TRANSESOPHAGEAL
Anesthesia: General | Site: Chest | Laterality: Right

## 2015-11-13 MED ORDER — LIDOCAINE HCL (CARDIAC) 20 MG/ML IV SOLN
INTRAVENOUS | Status: DC | PRN
  Administered 2015-11-13: 100 mg via INTRAVENOUS

## 2015-11-13 MED ORDER — PANTOPRAZOLE SODIUM 40 MG PO TBEC
40.0000 mg | DELAYED_RELEASE_TABLET | Freq: Every day | ORAL | Status: DC
  Administered 2015-11-14 – 2015-11-16 (×3): 40 mg via ORAL
  Filled 2015-11-13 (×3): qty 1

## 2015-11-13 MED ORDER — ACETAMINOPHEN 160 MG/5ML PO SOLN: 650.0000 mg | Freq: Once | ORAL | Status: AC

## 2015-11-13 MED ORDER — FAMOTIDINE IN NACL 20-0.9 MG/50ML-% IV SOLN
20.0000 mg | Freq: Two times a day (BID) | INTRAVENOUS | Status: DC
  Administered 2015-11-13: 20 mg via INTRAVENOUS

## 2015-11-13 MED ORDER — EPHEDRINE SULFATE 50 MG/ML IJ SOLN
INTRAMUSCULAR | Status: AC
  Filled 2015-11-13: qty 1

## 2015-11-13 MED ORDER — METOPROLOL TARTRATE 12.5 MG HALF TABLET: 12.5000 mg | ORAL_TABLET | Freq: Two times a day (BID) | ORAL | Status: DC

## 2015-11-13 MED ORDER — ASPIRIN 81 MG PO CHEW: 324.0000 mg | CHEWABLE_TABLET | Freq: Every day | ORAL | Status: DC

## 2015-11-13 MED ORDER — FENTANYL CITRATE (PF) 250 MCG/5ML IJ SOLN
INTRAMUSCULAR | Status: AC
  Filled 2015-11-13: qty 5

## 2015-11-13 MED ORDER — FENTANYL CITRATE (PF) 250 MCG/5ML IJ SOLN
INTRAMUSCULAR | Status: AC
  Filled 2015-11-13: qty 25

## 2015-11-13 MED ORDER — SODIUM CHLORIDE 0.9 % IJ SOLN
INTRAMUSCULAR | Status: AC
  Filled 2015-11-13: qty 10

## 2015-11-13 MED ORDER — ACETAMINOPHEN 160 MG/5ML PO SOLN: 1000.0000 mg | Freq: Four times a day (QID) | ORAL | Status: DC

## 2015-11-13 MED ORDER — LACTATED RINGERS IV SOLN
INTRAVENOUS | Status: DC | PRN
  Administered 2015-11-13 (×2): via INTRAVENOUS

## 2015-11-13 MED ORDER — 0.9 % SODIUM CHLORIDE (POUR BTL) OPTIME
TOPICAL | Status: DC | PRN
  Administered 2015-11-13: 6000 mL

## 2015-11-13 MED ORDER — PROPOFOL 10 MG/ML IV BOLUS
INTRAVENOUS | Status: AC
  Filled 2015-11-13: qty 20

## 2015-11-13 MED ORDER — BISACODYL 5 MG PO TBEC
10.0000 mg | DELAYED_RELEASE_TABLET | Freq: Every day | ORAL | Status: DC
  Administered 2015-11-14 – 2015-11-17 (×4): 10 mg via ORAL
  Filled 2015-11-13 (×4): qty 2

## 2015-11-13 MED ORDER — ACETAMINOPHEN 500 MG PO TABS
1000.0000 mg | ORAL_TABLET | Freq: Four times a day (QID) | ORAL | Status: DC
  Administered 2015-11-13 – 2015-11-16 (×10): 1000 mg via ORAL
  Filled 2015-11-13 (×9): qty 2

## 2015-11-13 MED ORDER — VANCOMYCIN HCL IN DEXTROSE 1-5 GM/200ML-% IV SOLN
1000.0000 mg | Freq: Once | INTRAVENOUS | Status: AC
  Administered 2015-11-13: 1000 mg via INTRAVENOUS
  Filled 2015-11-13: qty 200

## 2015-11-13 MED ORDER — HEPARIN SODIUM (PORCINE) 1000 UNIT/ML IJ SOLN
INTRAMUSCULAR | Status: AC
  Filled 2015-11-13: qty 4

## 2015-11-13 MED ORDER — ONDANSETRON HCL 4 MG/2ML IJ SOLN
4.0000 mg | Freq: Four times a day (QID) | INTRAMUSCULAR | Status: DC | PRN
  Administered 2015-11-13 – 2015-11-19 (×3): 4 mg via INTRAVENOUS
  Filled 2015-11-13 (×3): qty 2

## 2015-11-13 MED ORDER — PROTAMINE SULFATE 10 MG/ML IV SOLN
INTRAVENOUS | Status: AC
  Filled 2015-11-13: qty 5

## 2015-11-13 MED ORDER — MIDAZOLAM HCL 2 MG/2ML IJ SOLN: 2.0000 mg | INTRAMUSCULAR | Status: DC | PRN

## 2015-11-13 MED ORDER — HEPARIN SODIUM (PORCINE) 1000 UNIT/ML IJ SOLN
INTRAMUSCULAR | Status: DC | PRN
  Administered 2015-11-13: 43000 [IU] via INTRAVENOUS

## 2015-11-13 MED ORDER — METOPROLOL TARTRATE 1 MG/ML IV SOLN
2.5000 mg | INTRAVENOUS | Status: DC | PRN
Start: 2015-11-13 — End: 2015-11-16

## 2015-11-13 MED ORDER — MORPHINE SULFATE (PF) 2 MG/ML IV SOLN
2.0000 mg | INTRAVENOUS | Status: DC | PRN
  Administered 2015-11-13: 2 mg via INTRAVENOUS
  Filled 2015-11-13 (×2): qty 1

## 2015-11-13 MED ORDER — ALBUMIN HUMAN 5 % IV SOLN: 250.0000 mL | INTRAVENOUS | Status: DC | PRN

## 2015-11-13 MED ORDER — INSULIN REGULAR BOLUS VIA INFUSION
0.0000 [IU] | Freq: Three times a day (TID) | INTRAVENOUS | Status: DC
  Filled 2015-11-13: qty 10

## 2015-11-13 MED ORDER — MIDAZOLAM HCL 5 MG/5ML IJ SOLN
INTRAMUSCULAR | Status: DC | PRN
  Administered 2015-11-13: 1 mg via INTRAVENOUS
  Administered 2015-11-13: 3 mg via INTRAVENOUS
  Administered 2015-11-13: 1 mg via INTRAVENOUS
  Administered 2015-11-13: 3 mg via INTRAVENOUS
  Administered 2015-11-13: 2 mg via INTRAVENOUS

## 2015-11-13 MED ORDER — LACTATED RINGERS IV SOLN: 500.0000 mL | Freq: Once | INTRAVENOUS | Status: DC | PRN

## 2015-11-13 MED ORDER — SODIUM CHLORIDE 0.9 % IJ SOLN
3.0000 mL | Freq: Two times a day (BID) | INTRAMUSCULAR | Status: DC
  Administered 2015-11-14 – 2015-11-16 (×5): 3 mL via INTRAVENOUS

## 2015-11-13 MED ORDER — ROCURONIUM BROMIDE 100 MG/10ML IV SOLN
INTRAVENOUS | Status: DC | PRN
  Administered 2015-11-13: 60 mg via INTRAVENOUS
  Administered 2015-11-13 (×2): 50 mg via INTRAVENOUS
  Administered 2015-11-13: 30 mg via INTRAVENOUS
  Administered 2015-11-13: 40 mg via INTRAVENOUS

## 2015-11-13 MED ORDER — ARTIFICIAL TEARS OP OINT
TOPICAL_OINTMENT | OPHTHALMIC | Status: AC
  Filled 2015-11-13: qty 3.5

## 2015-11-13 MED ORDER — OXYCODONE HCL 5 MG PO TABS
5.0000 mg | ORAL_TABLET | ORAL | Status: DC | PRN
  Administered 2015-11-15 (×3): 10 mg via ORAL
  Administered 2015-11-15: 5 mg via ORAL
  Administered 2015-11-15 – 2015-11-16 (×3): 10 mg via ORAL
  Filled 2015-11-13 (×2): qty 2
  Filled 2015-11-13: qty 1
  Filled 2015-11-13 (×5): qty 2

## 2015-11-13 MED ORDER — LACTATED RINGERS IV SOLN
INTRAVENOUS | Status: DC | PRN
  Administered 2015-11-13: 07:00:00 via INTRAVENOUS

## 2015-11-13 MED ORDER — MORPHINE SULFATE (PF) 2 MG/ML IV SOLN: 1.0000 mg | INTRAVENOUS | Status: DC | PRN

## 2015-11-13 MED ORDER — PROPOFOL 10 MG/ML IV BOLUS
INTRAVENOUS | Status: DC | PRN
  Administered 2015-11-13 (×2): 20 mg via INTRAVENOUS
  Administered 2015-11-13: 50 mg via INTRAVENOUS
  Administered 2015-11-13: 80 mg via INTRAVENOUS

## 2015-11-13 MED ORDER — DOCUSATE SODIUM 100 MG PO CAPS
200.0000 mg | ORAL_CAPSULE | Freq: Every day | ORAL | Status: DC
  Administered 2015-11-14 – 2015-11-16 (×3): 200 mg via ORAL
  Filled 2015-11-13 (×4): qty 2

## 2015-11-13 MED ORDER — SODIUM CHLORIDE 0.9 % IV SOLN
250.0000 mL | INTRAVENOUS | Status: DC
Start: 2015-11-14 — End: 2015-11-24
  Administered 2015-11-14: 250 mL via INTRAVENOUS

## 2015-11-13 MED ORDER — ASPIRIN EC 325 MG PO TBEC
325.0000 mg | DELAYED_RELEASE_TABLET | Freq: Every day | ORAL | Status: DC
  Administered 2015-11-14 – 2015-11-17 (×4): 325 mg via ORAL
  Filled 2015-11-13 (×4): qty 1

## 2015-11-13 MED ORDER — ACETAMINOPHEN 650 MG RE SUPP
650.0000 mg | Freq: Once | RECTAL | Status: AC
  Administered 2015-11-13: 650 mg via RECTAL

## 2015-11-13 MED ORDER — LIDOCAINE HCL (CARDIAC) 20 MG/ML IV SOLN
INTRAVENOUS | Status: AC
  Filled 2015-11-13: qty 5

## 2015-11-13 MED ORDER — PHENYLEPHRINE 40 MCG/ML (10ML) SYRINGE FOR IV PUSH (FOR BLOOD PRESSURE SUPPORT)
PREFILLED_SYRINGE | INTRAVENOUS | Status: AC
  Filled 2015-11-13: qty 10

## 2015-11-13 MED ORDER — MAGNESIUM SULFATE 4 GM/100ML IV SOLN
4.0000 g | Freq: Once | INTRAVENOUS | Status: AC
  Administered 2015-11-13: 4 g via INTRAVENOUS
  Filled 2015-11-13: qty 100

## 2015-11-13 MED ORDER — BUPIVACAINE HCL (PF) 0.5 % IJ SOLN
INTRAMUSCULAR | Status: AC
  Filled 2015-11-13: qty 10

## 2015-11-13 MED ORDER — NITROGLYCERIN IN D5W 200-5 MCG/ML-% IV SOLN: 0.0000 ug/min | INTRAVENOUS | Status: DC

## 2015-11-13 MED ORDER — CHLORHEXIDINE GLUCONATE 0.12 % MT SOLN
15.0000 mL | OROMUCOSAL | Status: AC
  Administered 2015-11-13: 15 mL via OROMUCOSAL

## 2015-11-13 MED ORDER — PHENYLEPHRINE HCL 10 MG/ML IJ SOLN
INTRAMUSCULAR | Status: DC | PRN
  Administered 2015-11-13 (×4): 80 ug via INTRAVENOUS

## 2015-11-13 MED ORDER — PROTAMINE SULFATE 10 MG/ML IV SOLN
INTRAVENOUS | Status: DC | PRN
  Administered 2015-11-13: 350 mg via INTRAVENOUS

## 2015-11-13 MED ORDER — BUPIVACAINE 0.5 % ON-Q PUMP SINGLE CATH 400 ML
400.0000 mL | INJECTION | Status: DC
  Administered 2015-11-13: 400 mL
  Filled 2015-11-13: qty 400

## 2015-11-13 MED ORDER — LACTATED RINGERS IV SOLN: INTRAVENOUS | Status: DC

## 2015-11-13 MED ORDER — DEXTROSE 5 % IV SOLN
1.5000 g | Freq: Two times a day (BID) | INTRAVENOUS | Status: AC
  Administered 2015-11-13 – 2015-11-15 (×4): 1.5 g via INTRAVENOUS
  Filled 2015-11-13 (×4): qty 1.5

## 2015-11-13 MED ORDER — POTASSIUM CHLORIDE 10 MEQ/50ML IV SOLN: 10.0000 meq | INTRAVENOUS | Status: AC

## 2015-11-13 MED ORDER — MIDAZOLAM HCL 2 MG/2ML IJ SOLN
INTRAMUSCULAR | Status: AC
  Filled 2015-11-13: qty 2

## 2015-11-13 MED ORDER — PROTAMINE SULFATE 10 MG/ML IV SOLN
INTRAVENOUS | Status: AC
  Filled 2015-11-13: qty 50

## 2015-11-13 MED ORDER — SODIUM CHLORIDE 0.45 % IV SOLN: INTRAVENOUS | Status: DC | PRN

## 2015-11-13 MED ORDER — BISACODYL 10 MG RE SUPP: 10.0000 mg | Freq: Every day | RECTAL | Status: DC

## 2015-11-13 MED ORDER — SODIUM CHLORIDE 0.9 % IV SOLN
INTRAVENOUS | Status: DC | PRN
  Administered 2015-11-13 (×2): via INTRAVENOUS

## 2015-11-13 MED ORDER — PHENYLEPHRINE HCL 10 MG/ML IJ SOLN
0.0000 ug/min | INTRAVENOUS | Status: DC
  Filled 2015-11-13: qty 2

## 2015-11-13 MED ORDER — SODIUM CHLORIDE 0.9 % IV SOLN
INTRAVENOUS | Status: DC
  Filled 2015-11-13: qty 40

## 2015-11-13 MED ORDER — MIDAZOLAM HCL 10 MG/2ML IJ SOLN
INTRAMUSCULAR | Status: AC
  Filled 2015-11-13: qty 2

## 2015-11-13 MED ORDER — DEXMEDETOMIDINE HCL IN NACL 200 MCG/50ML IV SOLN
0.0000 ug/kg/h | INTRAVENOUS | Status: DC
  Administered 2015-11-13: 0.7 ug/kg/h via INTRAVENOUS
  Filled 2015-11-13: qty 50

## 2015-11-13 MED ORDER — SUCCINYLCHOLINE CHLORIDE 20 MG/ML IJ SOLN
INTRAMUSCULAR | Status: AC
  Filled 2015-11-13: qty 1

## 2015-11-13 MED ORDER — SODIUM CHLORIDE 0.9 % IV SOLN
INTRAVENOUS | Status: DC
  Administered 2015-11-13: 10 mL via INTRAVENOUS

## 2015-11-13 MED ORDER — BUPIVACAINE HCL 0.5 % IJ SOLN
INTRAMUSCULAR | Status: DC | PRN
  Administered 2015-11-13: 10 mL

## 2015-11-13 MED ORDER — HEPARIN SODIUM (PORCINE) 1000 UNIT/ML IJ SOLN
INTRAMUSCULAR | Status: AC
  Filled 2015-11-13: qty 1

## 2015-11-13 MED ORDER — FENTANYL CITRATE (PF) 100 MCG/2ML IJ SOLN
INTRAMUSCULAR | Status: DC | PRN
  Administered 2015-11-13 (×2): 150 ug via INTRAVENOUS
  Administered 2015-11-13: 25 ug via INTRAVENOUS
  Administered 2015-11-13: 250 ug via INTRAVENOUS
  Administered 2015-11-13: 125 ug via INTRAVENOUS
  Administered 2015-11-13: 100 ug via INTRAVENOUS
  Administered 2015-11-13: 50 ug via INTRAVENOUS
  Administered 2015-11-13: 150 ug via INTRAVENOUS
  Administered 2015-11-13 (×2): 100 ug via INTRAVENOUS
  Administered 2015-11-13: 50 ug via INTRAVENOUS
  Administered 2015-11-13: 250 ug via INTRAVENOUS

## 2015-11-13 MED ORDER — FUROSEMIDE 10 MG/ML IJ SOLN
20.0000 mg | Freq: Once | INTRAMUSCULAR | Status: AC
  Administered 2015-11-13: 20 mg via INTRAVENOUS

## 2015-11-13 MED ORDER — SODIUM CHLORIDE 0.9 % IV SOLN
INTRAVENOUS | Status: DC
  Administered 2015-11-13: 100 mL via INTRAVENOUS

## 2015-11-13 MED ORDER — ROCURONIUM BROMIDE 50 MG/5ML IV SOLN
INTRAVENOUS | Status: AC
  Filled 2015-11-13: qty 5

## 2015-11-13 MED ORDER — TRAMADOL HCL 50 MG PO TABS: 50.0000 mg | ORAL_TABLET | ORAL | Status: DC | PRN

## 2015-11-13 MED ORDER — EPHEDRINE SULFATE 50 MG/ML IJ SOLN
INTRAMUSCULAR | Status: DC | PRN
Start: 2015-11-13 — End: 2015-11-13
  Administered 2015-11-13: 5 mg via INTRAVENOUS

## 2015-11-13 MED ORDER — INSULIN ASPART 100 UNIT/ML ~~LOC~~ SOLN
0.0000 [IU] | SUBCUTANEOUS | Status: DC
  Administered 2015-11-14: 2 [IU] via SUBCUTANEOUS

## 2015-11-13 MED ORDER — SODIUM CHLORIDE 0.9 % IJ SOLN: 3.0000 mL | INTRAMUSCULAR | Status: DC | PRN

## 2015-11-13 MED ORDER — ALBUMIN HUMAN 5 % IV SOLN
INTRAVENOUS | Status: DC | PRN
  Administered 2015-11-13 (×2): via INTRAVENOUS

## 2015-11-13 MED ORDER — METOPROLOL TARTRATE 25 MG/10 ML ORAL SUSPENSION: 12.5000 mg | Freq: Two times a day (BID) | ORAL | Status: DC

## 2015-11-13 MED ORDER — SODIUM CHLORIDE 0.9 % IV SOLN
INTRAVENOUS | Status: DC
  Filled 2015-11-13: qty 2.5

## 2015-11-13 MED ORDER — ARTIFICIAL TEARS OP OINT
TOPICAL_OINTMENT | OPHTHALMIC | Status: DC | PRN
  Administered 2015-11-13: 1 via OPHTHALMIC

## 2015-11-13 MED FILL — Sodium Chloride IV Soln 0.9%: INTRAVENOUS | Qty: 1000 | Status: AC

## 2015-11-13 MED FILL — Heparin Sodium (Porcine) Inj 1000 Unit/ML: INTRAMUSCULAR | Qty: 30 | Status: AC

## 2015-11-13 MED FILL — Electrolyte-R (PH 7.4) Solution: INTRAVENOUS | Qty: 3000 | Status: AC

## 2015-11-13 MED FILL — Magnesium Sulfate Inj 50%: INTRAMUSCULAR | Qty: 10 | Status: AC

## 2015-11-13 MED FILL — Potassium Chloride Inj 2 mEq/ML: INTRAVENOUS | Qty: 40 | Status: AC

## 2015-11-13 MED FILL — Sodium Bicarbonate IV Soln 8.4%: INTRAVENOUS | Qty: 50 | Status: AC

## 2015-11-13 MED FILL — Heparin Sodium (Porcine) Inj 1000 Unit/ML: INTRAMUSCULAR | Qty: 20 | Status: AC

## 2015-11-13 MED FILL — Lidocaine HCl IV Inj 20 MG/ML: INTRAVENOUS | Qty: 5 | Status: AC

## 2015-11-13 MED FILL — Mannitol IV Soln 20%: INTRAVENOUS | Qty: 500 | Status: AC

## 2015-11-13 SURGICAL SUPPLY — 119 items
ADAPTER CARDIO PERF ANTE/RETRO (ADAPTER) ×3 IMPLANT
ADH SKN CLS APL DERMABOND .7 (GAUZE/BANDAGES/DRESSINGS) ×4
ADPR PRFSN 84XANTGRD RTRGD (ADAPTER) ×2
BAG DECANTER FOR FLEXI CONT (MISCELLANEOUS) ×6 IMPLANT
BLADE SURG 11 STRL SS (BLADE) ×3 IMPLANT
CABLE PACING FASLOC BIEGE (MISCELLANEOUS) ×1 IMPLANT
CANISTER SUCTION 2500CC (MISCELLANEOUS) ×6 IMPLANT
CANNULA FEM VENOUS REMOTE 22FR (CANNULA) ×1 IMPLANT
CANNULA FEMORAL ART 14 SM (MISCELLANEOUS) ×3 IMPLANT
CANNULA GUNDRY RCSP 15FR (MISCELLANEOUS) ×3 IMPLANT
CANNULA OPTISITE PERFUSION 16F (CANNULA) IMPLANT
CANNULA OPTISITE PERFUSION 18F (CANNULA) ×1 IMPLANT
CANNULA SUMP PERICARDIAL (CANNULA) ×6 IMPLANT
CATH KIT ON Q 5IN SLV (PAIN MANAGEMENT) IMPLANT
CATH KIT ON-Q SILVERSOAK 5 (CATHETERS) IMPLANT
CATH KIT ON-Q SILVERSOAK 5IN (CATHETERS) ×3 IMPLANT
CONN ST 1/4X3/8  BEN (MISCELLANEOUS) ×2
CONN ST 1/4X3/8 BEN (MISCELLANEOUS) ×4 IMPLANT
CONNECTOR 1/2X3/8X1/2 3 WAY (MISCELLANEOUS) ×1
CONNECTOR 1/2X3/8X1/2 3WAY (MISCELLANEOUS) ×2 IMPLANT
CONT SPEC 4OZ CLIKSEAL STRL BL (MISCELLANEOUS) ×1 IMPLANT
CONT SPEC STER OR (MISCELLANEOUS) ×3 IMPLANT
COVER BACK TABLE 24X17X13 BIG (DRAPES) ×3 IMPLANT
COVER PROBE W GEL 5X96 (DRAPES) ×1 IMPLANT
CRADLE DONUT ADULT HEAD (MISCELLANEOUS) ×3 IMPLANT
DERMABOND ADVANCED (GAUZE/BANDAGES/DRESSINGS) ×2
DERMABOND ADVANCED .7 DNX12 (GAUZE/BANDAGES/DRESSINGS) ×4 IMPLANT
DEVICE PMI PUNCTURE CLOSURE (MISCELLANEOUS) ×3 IMPLANT
DEVICE SUT CK QUICK LOAD MINI (Prosthesis & Implant Heart) ×2 IMPLANT
DEVICE TROCAR PUNCTURE CLOSURE (ENDOMECHANICALS) ×3 IMPLANT
DRAIN CHANNEL 28F RND 3/8 FF (WOUND CARE) ×6 IMPLANT
DRAPE BILATERAL SPLIT (DRAPES) ×3 IMPLANT
DRAPE C-ARM 42X72 X-RAY (DRAPES) ×3 IMPLANT
DRAPE CV SPLIT W-CLR ANES SCRN (DRAPES) ×3 IMPLANT
DRAPE INCISE IOBAN 66X45 STRL (DRAPES) ×10 IMPLANT
DRAPE PROXIMA HALF (DRAPES) ×1 IMPLANT
DRAPE SLUSH/WARMER DISC (DRAPES) ×3 IMPLANT
DRSG COVADERM 4X8 (GAUZE/BANDAGES/DRESSINGS) ×3 IMPLANT
ELECT BLADE 6.5 EXT (BLADE) ×3 IMPLANT
ELECT REM PT RETURN 9FT ADLT (ELECTROSURGICAL) ×6
ELECTRODE REM PT RTRN 9FT ADLT (ELECTROSURGICAL) ×4 IMPLANT
FEMORAL VENOUS CANN RAP (CANNULA) IMPLANT
GLOVE BIO SURGEON STRL SZ 6.5 (GLOVE) ×4 IMPLANT
GLOVE ORTHO TXT STRL SZ7.5 (GLOVE) ×9 IMPLANT
GOWN STRL REUS W/ TWL LRG LVL3 (GOWN DISPOSABLE) ×8 IMPLANT
GOWN STRL REUS W/TWL LRG LVL3 (GOWN DISPOSABLE) ×12
GRAFT PTCH CORMATRIX 7X10 4PLY (Prosthesis & Implant Heart) ×1 IMPLANT
IV NS 1000ML (IV SOLUTION) ×3
IV NS 1000ML BAXH (IV SOLUTION) IMPLANT
IV NS IRRIG 3000ML ARTHROMATIC (IV SOLUTION) ×1 IMPLANT
KIT BASIN OR (CUSTOM PROCEDURE TRAY) ×3 IMPLANT
KIT DILATOR VASC 18G NDL (KITS) ×3 IMPLANT
KIT DRAINAGE VACCUM ASSIST (KITS) ×1 IMPLANT
KIT ROOM TURNOVER OR (KITS) ×3 IMPLANT
KIT SUCTION CATH 14FR (SUCTIONS) ×3 IMPLANT
KIT SUT CK MINI COMBO 4X17 (Prosthesis & Implant Heart) ×1 IMPLANT
LEAD PACING MYOCARDI (MISCELLANEOUS) ×3 IMPLANT
LINE VENT (MISCELLANEOUS) ×1 IMPLANT
NDL AORTIC ROOT 14G 7F (CATHETERS) ×2 IMPLANT
NEEDLE AORTIC ROOT 14G 7F (CATHETERS) ×3 IMPLANT
NS IRRIG 1000ML POUR BTL (IV SOLUTION) ×16 IMPLANT
PACK OPEN HEART (CUSTOM PROCEDURE TRAY) ×3 IMPLANT
PAD ARMBOARD 7.5X6 YLW CONV (MISCELLANEOUS) ×6 IMPLANT
PAD ELECT DEFIB RADIOL ZOLL (MISCELLANEOUS) ×3 IMPLANT
RETRACTOR TRL SOFT TISSUE LG (INSTRUMENTS) IMPLANT
RETRACTOR TRM SOFT TISSUE 7.5 (INSTRUMENTS) IMPLANT
SET CANNULATION TOURNIQUET (MISCELLANEOUS) ×3 IMPLANT
SET CARDIOPLEGIA MPS 5001102 (MISCELLANEOUS) ×1 IMPLANT
SET IRRIG TUBING LAPAROSCOPIC (IRRIGATION / IRRIGATOR) ×3 IMPLANT
SOLUTION ANTI FOG 6CC (MISCELLANEOUS) ×3 IMPLANT
SPONGE GAUZE 4X4 12PLY STER LF (GAUZE/BANDAGES/DRESSINGS) ×3 IMPLANT
SUT BONE WAX W31G (SUTURE) ×3 IMPLANT
SUT E-PACK MINIMALLY INVASIVE (SUTURE) ×3 IMPLANT
SUT ETHIBON 2 0 V 52N 30 (SUTURE) ×1 IMPLANT
SUT ETHIBOND 2 0 SH (SUTURE) ×2 IMPLANT
SUT ETHIBOND NAB MH 2-0 36IN (SUTURE) ×1 IMPLANT
SUT ETHIBOND X763 2 0 SH 1 (SUTURE) ×3 IMPLANT
SUT GORETEX CV 4 TH 22 36 (SUTURE) ×3 IMPLANT
SUT GORETEX CV4 TH-18 (SUTURE) ×6 IMPLANT
SUT MNCRL AB 3-0 PS2 18 (SUTURE) ×2 IMPLANT
SUT PROLENE 3 0 SH 1 (SUTURE) ×4 IMPLANT
SUT PROLENE 3 0 SH DA (SUTURE) ×4 IMPLANT
SUT PROLENE 3 0 SH1 36 (SUTURE) ×12 IMPLANT
SUT PROLENE 4 0 RB 1 (SUTURE) ×15
SUT PROLENE 4-0 RB1 .5 CRCL 36 (SUTURE) IMPLANT
SUT PROLENE 5 0 C 1 36 (SUTURE) ×2 IMPLANT
SUT PROLENE 6 0 C 1 30 (SUTURE) ×2 IMPLANT
SUT SILK  1 MH (SUTURE) ×8
SUT SILK 1 MH (SUTURE) IMPLANT
SUT SILK 1 TIES 10X30 (SUTURE) ×1 IMPLANT
SUT SILK 2 0 SH CR/8 (SUTURE) ×1 IMPLANT
SUT SILK 2 0 TIES 10X30 (SUTURE) ×1 IMPLANT
SUT SILK 2 0SH CR/8 30 (SUTURE) ×2 IMPLANT
SUT SILK 3 0 (SUTURE) ×3
SUT SILK 3 0 SH CR/8 (SUTURE) ×1 IMPLANT
SUT SILK 3 0SH CR/8 30 (SUTURE) ×1 IMPLANT
SUT SILK 3-0 18XBRD TIE 12 (SUTURE) IMPLANT
SUT TEM PAC WIRE 2 0 SH (SUTURE) ×2 IMPLANT
SUT VIC AB 2-0 CTX 27 (SUTURE) ×1 IMPLANT
SUT VIC AB 2-0 CTX 36 (SUTURE) ×2 IMPLANT
SUT VIC AB 2-0 UR6 27 (SUTURE) ×2 IMPLANT
SUT VIC AB 3-0 SH 8-18 (SUTURE) ×3 IMPLANT
SUT VICRYL 2 TP 1 (SUTURE) ×1 IMPLANT
SYR BULB IRRIGATION 50ML (SYRINGE) ×3 IMPLANT
SYRINGE 10CC LL (SYRINGE) ×3 IMPLANT
SYSTEM SAHARA CHEST DRAIN ATS (WOUND CARE) ×3 IMPLANT
TAPE CLOTH SURG 4X10 WHT LF (GAUZE/BANDAGES/DRESSINGS) ×1 IMPLANT
TAPE PAPER 2X10 WHT MICROPORE (GAUZE/BANDAGES/DRESSINGS) ×1 IMPLANT
TOWEL OR 17X24 6PK STRL BLUE (TOWEL DISPOSABLE) ×6 IMPLANT
TOWEL OR 17X26 10 PK STRL BLUE (TOWEL DISPOSABLE) ×6 IMPLANT
TRAY FOLEY IC TEMP SENS 16FR (CATHETERS) ×3 IMPLANT
TROCAR XCEL BLADELESS 5X75MML (TROCAR) ×3 IMPLANT
TROCAR XCEL NON-BLD 11X100MML (ENDOMECHANICALS) ×6 IMPLANT
TUBE SUCT INTRACARD DLP 20F (MISCELLANEOUS) ×3 IMPLANT
TUNNELER SHEATH ON-Q 11GX8 DSP (PAIN MANAGEMENT) ×1 IMPLANT
UNDERPAD 30X30 INCONTINENT (UNDERPADS AND DIAPERS) ×3 IMPLANT
VALVE MITRAL 33MM (Prosthesis & Implant Heart) ×1 IMPLANT
WATER STERILE IRR 1000ML POUR (IV SOLUTION) ×6 IMPLANT
WIRE BENTSON .035X145CM (WIRE) ×3 IMPLANT

## 2015-11-13 NOTE — Progress Notes (Signed)
[facility] BRIEF SICU PROGRESS NOTE  Day of Surgery  S/P Procedure(s) (LRB): TRANSESOPHAGEAL ECHOCARDIOGRAM (TEE) (N/A) MINIMALLY INVASIVE MITRAL VALVE (MV) REPLACEMENT (Right)   Looks great Extubated uneventfully Denies pain, SOB AAI paced w/ stable hemodynamics on IV NTG for hypertension PA pressures low Diuresing very well Low chest tube output  Plan: Continue routine early postop  [name], MD 11/13/2015 7:46 PM

## 2015-11-13 NOTE — Anesthesia Preprocedure Evaluation (Signed)
Anesthesia Evaluation  Patient identified by MRN, date of birth, ID band Patient awake    Reviewed: Allergy & Precautions, NPO status , Patient's Chart, lab work & pertinent test results  History of Anesthesia Complications Negative for: history of anesthetic complications  Airway Mallampati: II  TM Distance: >3 FB Neck ROM: Full    Dental  (+) Dental Advisory Given, Edentulous Upper, Edentulous Lower   Pulmonary shortness of breath, sleep apnea , former smoker,    breath sounds clear to auscultation       Cardiovascular + Peripheral Vascular Disease and +CHF  + Valvular Problems/Murmurs MR  Rhythm:Regular Rate:Normal + Systolic murmurs    Neuro/Psych negative neurological ROS  negative psych ROS   GI/Hepatic negative GI ROS, Neg liver ROS,   Endo/Other  Morbid obesity  Renal/GU negative Renal ROS     Musculoskeletal negative musculoskeletal ROS (+)   Abdominal (+) + obese,   Peds  Hematology negative hematology ROS (+)   Anesthesia Other Findings   Reproductive/Obstetrics                             Anesthesia Physical Anesthesia Plan  ASA: III  Anesthesia Plan: General   Post-op Pain Management:    Induction: Intravenous  Airway Management Planned: Double Lumen EBT  Additional Equipment: Arterial line, PA Cath, 3D TEE and Ultrasound Guidance Line Placement  Intra-op Plan:   Post-operative Plan: Post-operative intubation/ventilation  Informed Consent: I have reviewed the patients History and Physical, chart, labs and discussed the procedure including the risks, benefits and alternatives for the proposed anesthesia with the patient or authorized representative who has indicated his/her understanding and acceptance.   Dental advisory given  Plan Discussed with: CRNA and Surgeon  Anesthesia Plan Comments:         Anesthesia Quick Evaluation

## 2015-11-13 NOTE — Progress Notes (Signed)
  Echocardiogram Echocardiogram Transesophageal has been performed.  Oscar Brown 11/13/2015, 9:19 AM

## 2015-11-13 NOTE — Anesthesia Postprocedure Evaluation (Signed)
Anesthesia Post Note  Patient: Oscar Brown  Procedure(s) Performed: Procedure(s) (LRB): TRANSESOPHAGEAL ECHOCARDIOGRAM (TEE) (N/A) MINIMALLY INVASIVE MITRAL VALVE (MV) REPLACEMENT (Right)  Patient location during evaluation: SICU Anesthesia Type: General Level of consciousness: sedated and patient remains intubated per anesthesia plan Pain management: pain level controlled Vital Signs Assessment: post-procedure vital signs reviewed and stable Respiratory status: patient remains intubated per anesthesia plan and patient on ventilator - see flowsheet for VS Cardiovascular status: stable Anesthetic complications: no    Last Vitals:  Filed Vitals:   11/13/15 1600 11/13/15 1615  BP: 103/80   Pulse: 80 80  Temp: 35.7 C 36 C  Resp: 12 12    Last Pain: There were no vitals filed for this visit.               Machell Wirthlin,JAMES TERRILL

## 2015-11-13 NOTE — Interval H&P Note (Signed)
History and Physical Interval Note:  11/13/2015 6:22 AM  Oscar Brown  has presented today for surgery, with the diagnosis of MR  The various methods of treatment have been discussed with the patient and family. After consideration of risks, benefits and other options for treatment, the patient has consented to  Procedure(s): MINIMALLY INVASIVE MITRAL VALVE REPAIR OR REPLACEMENT (MVR) (Right) TRANSESOPHAGEAL ECHOCARDIOGRAM (TEE) (N/A) as a surgical intervention .  The patient's history has been reviewed, patient examined, no change in status, stable for surgery.  I have reviewed the patient's chart and labs.  Questions were answered to the patient's satisfaction.     [name]

## 2015-11-13 NOTE — Anesthesia Procedure Notes (Addendum)
Central Venous Catheter Insertion Performed by: anesthesiologist Patient location: Pre-op. Preanesthetic checklist: patient identified, IV checked, site marked, risks and benefits discussed, surgical consent, monitors and equipment checked, pre-op evaluation, timeout performed and anesthesia consent Position: Trendelenburg Landmarks identified Catheter size: 8.5 Fr PA cath was placed.Sheath introducer Swan type and PA catheter depth:thermodilationProcedure performed using ultrasound guided technique. Attempts: 1 Following insertion, line sutured. Post procedure assessment: blood return through all ports. Patient tolerated the procedure well with no immediate complications.    Procedure Name: Intubation Date/Time: 11/13/2015 9:05 AM Performed by: Merdis Delay Pre-anesthesia Checklist: Patient identified, Timeout performed, Emergency Drugs available, Suction available and Patient being monitored Patient Re-evaluated:Patient Re-evaluated prior to inductionOxygen Delivery Method: Circle system utilized Preoxygenation: Pre-oxygenation with 100% oxygen Intubation Type: IV induction Ventilation: Mask ventilation without difficulty Laryngoscope Size: Mac and 3 Grade View: Grade I Tube type: Oral Endobronchial tube: Left and Double lumen EBT and 37 Fr Number of attempts: 2 Airway Equipment and Method: Stylet and Video-laryngoscopy Placement Confirmation: positive ETCO2,  CO2 detector,  ETT inserted through vocal cords under direct vision and breath sounds checked- equal and bilateral Tube secured with: Tape Dental Injury: Teeth and Oropharynx as per pre-operative assessment  Comments: DLT x1 with MAC 3 by SNRA- grade 1 view but limitied oral space to pass/bloody airway from previous oral surgery. DL x1 by CRNA- same view with tight oral space. Shoulder roll placed. Massagee then placed DLT in mouth followed by glidescope blade. DLT placed. Right maintstem noted. DLT then retracted and  readvanced to the left side with ease. +BBS/etco2.    Procedure Name: Intubation Date/Time: 11/13/2015 2:18 PM Performed by: Merdis Delay Pre-anesthesia Checklist: Patient identified, Emergency Drugs available, Patient being monitored, Suction available and Timeout performed Patient Re-evaluated:Patient Re-evaluated prior to inductionOxygen Delivery Method: Circle system utilized Preoxygenation: Pre-oxygenation with 100% oxygen Tube size: 7.5 mm Number of attempts: 1 Airway Equipment and Method: Video-laryngoscopy Placement Confirmation: ETT inserted through vocal cords under direct vision,  breath sounds checked- equal and bilateral,  positive ETCO2 and CO2 detector Secured at: 24 cm Tube secured with: Tape Dental Injury: Teeth and Oropharynx as per pre-operative assessment  Difficulty Due To: Difficulty was anticipated

## 2015-11-13 NOTE — Op Note (Signed)
CARDIOTHORACIC SURGERY OPERATIVE NOTE  Date of Procedure:  11/13/2015  Preoperative Diagnosis: Severe Mitral Regurgitation  Postoperative Diagnosis: Same  Procedure:    Minimally-Invasive Mitral Valve Replacement  Sorin [employer]omed[employer] Optiform bileaflet mechanical prosthesis (size 57mm, catalog # [id number], serial # [id number])    Surgeon: Valentina Gu. 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 Patient states that he was first noted to have a heart murmur on physical exam to her 3 years ago by his primary care physician. He has remained physically active and asymptomatic until recently. In October he developed fairly rapid onset symptoms of exertional shortness of breath and orthopnea with mild lower extremity edema. Symptoms progressed until he eventually presented to the hospital where he was hospitalized with acute diastolic congestive heart failure. Transthoracic echocardiogram revealed mitral regurgitation with normal left ventricular systolic function. Ejection fraction was estimated 55-60%. Visually the mitral regurgitation was felt to be moderate to severe, but calculations suggested the presence of only moderate mitral regurgitation with ERO measured 0.19 corresponding to regurgitant volume estimated 39 mL. The patient responded well to diuretic therapy and was seen in follow-up by Dr. Oval Linsey who performed TEE on 10/16/2015 revealed thickened mitral valve leaflets with severely restricted posterior leaflet and severe mitral  regurgitation. ERO measured 0.48 corresponding to a regurgitant volume estimated 91 mL and regurgitant fraction estimated 60%. Left ventricular size and systolic function appeared normal. The aortic valve was normal. Right ventricular size and systolic function was normal. There was trivial tricuspid regurgitation. The patient subsequently underwent left and right heart catheterization on 11/03/2015. The patient was found to have mild nonobstructive coronary artery disease and normal pulmonary artery pressures. The patient was referred for elective surgical consultation. The patient has been seen in consultation and counseled at length regarding the indications, risks and potential benefits of surgery.  All questions have been answered, and the patient provides full informed consent for the operation as described.    DETAILS OF THE OPERATIVE PROCEDURE  Preparation:  The patient is brought to the operating room on the above mentioned date and central monitoring was established by the anesthesia team including placement of Swan-Ganz catheter through the left internal jugular vein.  There was moderate-severe pulmonary hypertension.  A radial arterial line is placed. The patient is placed in the supine position on the operating table.  Intravenous antibiotics are administered. General endotracheal anesthesia is induced uneventfully. The patient is initially intubated using a dual lumen endotracheal tube.  A Foley catheter is placed.  Baseline transesophageal echocardiogram was performed.  Findings were notable for classical rheumatic appearance of the mitral valve with severe thickening of both leaflets, type IIIA dysfunction with severe restriction of both leaflets, thickening and some foreshortening of the subvalvular apparatus, moderate calcification, and severe (4+) mitral regurgitation.   There was no mitral stenosis. There was normal left ventricular systolic function. There was moderate left atrial  enlargement. Right ventricular size and function appeared normal. The tricuspid annulus was not dilated. There was mild tricuspid regurgitation. Aortic valve is normal.  A soft roll is placed behind the  patient's left scapula and the neck gently extended and turned to the left.   The patient's right neck, chest, abdomen, both groins, and both lower extremities are prepared and draped in a sterile manner. A time out procedure is performed.  Surgical Approach:  A right miniature anterolateral thoracotomy incision is performed. The incision is placed just lateral to and superior to the right nipple. The pectoralis major muscle is retracted medially and completely preserved. The right pleural space is entered through the 3rd intercostal space. A soft tissue retractor is placed.  Two 11 mm ports are placed through separate stab incisions inferiorly. The right pleural space is insufflated continuously with carbon dioxide gas through the posterior port during the remainder of the operation.  A pledgeted sutures placed through the dome of the right hemidiaphragm and retracted inferiorly to facilitate exposure.  A longitudinal incision is made in the pericardium 3 cm anterior to the phrenic nerve and silk traction sutures are placed on either side of the incision for exposure.   Extracorporeal Cardiopulmonary Bypass and Myocardial Protection:  A small incision is made in the right inguinal crease and the anterior surface of the right common femoral artery and right common femoral vein are identified.  The patient is placed in Trendelenburg position. The right internal jugular vein is cannulated with Seldinger technique and a guidewire advanced into the right atrium. The patient is heparinized systemically. The right internal jugular vein is cannulated with a 14 [place] pediatric femoral venous cannula. Pursestring sutures are placed on the anterior surface of the right common femoral vein and right common femoral  artery. The right common femoral vein is cannulated with the Seldinger technique and a guidewire is advanced under transesophageal echocardiogram guidance through the right atrium. The femoral vein is cannulated with a long 22 French femoral venous cannula. The right common femoral artery is cannulated with Seldinger technique and a flexible guidewire is advanced until it can be appreciated intraluminally in the descending thoracic aorta on transesophageal echocardiogram. The femoral artery is cannulated with an 18 French femoral arterial cannula.  Adequate heparinization is verified.     The entire pre-bypass portion of the operation was notable for stable hemodynamics.  Cardiopulmonary bypass was begun.  Vacuum assist venous drainage is utilized. The incision in the pericardium is extended in both directions. Venous drainage and exposure are notably excellent. Attempts to place a retrograde cardioplegia cannula through the right atrium into the coronary sinus using transesophageal echocardiogram guidance are unsuccessful.  An antegrade cardioplegia cannula is placed in the ascending aorta.    The patient is cooled to 28C systemic temperature.  The aortic cross clamp is applied and cold blood cardioplegia is delivered initially in an antegrade fashion through the aortic root.  The initial cardioplegic arrest is rapid with early diastolic arrest.  Repeat doses of cardioplegia are administered intermittently every 20 to 30 minutes throughout the entire cross clamp portion of the operation through the aortic root in order to maintain completely flat electrocardiogram.  Myocardial protection was felt to be excellent.   Mitral Valve Replacement:  A left atriotomy incision was performed through the interatrial groove and extended partially across the back wall of the left atrium after opening the oblique sinus inferiorly.  The mitral valve is exposed using a self-retaining retractor.  The mitral valve was  inspected and notable for classical rheumatic features with thickening and scarring involving both the anterior and the posterior leaflets.  There there is severe thickening, fusion, and foreshortening of  the majority of the subvalvular apparatus. The posterior leaflet is essentially completely fixed and immobile. There is moderate calcification in the subvalvular apparatus and the posterior annulus.  The entire anterior leaflet of the mitral valve and its associated subvalvular apparatus or excised sharply and the posterior leaflet is split in the midline and preserved. Chordae tendineae to the anterior leaflet are not preserved because of the severe foreshortening, fusion, and thickening of the majority of the cords. Mitral valve replacements performed using erupted 2-0 Ethibond horizontal mattress pledgeted sutures with pledgets in the supra-annular position.  The valve was sized to accept a 33 mm mechanical valve.  A Sorin [employer] Optiform bileaflet mechanical valve (size 33 mm, catalog [id number], serial [id number]) was secured in place uneventfully. All valve sutures were secured using a Cor-knot device.  The valve was tested with saline and appeared to function normally with no restriction of leaflet function.   Rewarming is begun.   Procedure Completion:  The atriotomy was closed using a 2-layer closure of running 3-0 Prolene suture after placing a sump drain across the mitral valve to serve as a left ventricular vent.  One final dose of warm "hot shot" cardioplegia was administered through the aortic root.  The aortic cross clamp was removed after a total cross clamp time of 87 minutes.  Epicardial pacing wires are fixed to the inferior wall of the right ventricule and to the right atrial appendage. The patient is rewarmed to 37C temperature. The left ventricular vent is removed.  The antegrade cardioplegia cannula is removed. The patient is weaned and disconnected from cardiopulmonary bypass.   The patient's rhythm at separation from bypass was AV paced.  The patient was weaned from bypass without any inotropic support. Total cardiopulmonary bypass time for the operation was 142 minutes.  Followup transesophageal echocardiogram performed after separation from bypass revealed a well-seated bileaflet mechanical prosthesis in the mitral position that was functioning normally. There was no perivalvular leak. Left ventricular function appeared normal. No other significant abnormalities were noted.   The femoral arterial and venous cannulae were removed uneventfully. There was a palpable pulse in the distal right common femoral artery after removal of the cannula. Protamine was administered to reverse the anticoagulation. The right internal jugular cannula was removed and manual pressure held on the neck for 15 minutes.  Single lung ventilation was begun. The atriotomy closure was inspected for hemostasis. The pericardial sac was drained using a 28 French Bard drain placed through the anterior port incision.  The pericardium was closed using a patch of core matrix bovine submucosal tissue patch. The right pleural space is irrigated with saline solution and inspected for hemostasis. The right pleural space was drained using a 28 French Bard drain placed through the posterior port incision. The miniature thoracotomy incision was closed in multiple layers in routine fashion. The right groin incision was inspected for hemostasis and closed in multiple layers in routine fashion.  The post-bypass portion of the operation was notable for stable rhythm and hemodynamics.  No blood products were administered during the operation.   Disposition:  The patient tolerated the procedure well.  The patient was reintubated using a single lumen endotracheal tube and subsequently transported to the surgical intensive care unit in stable condition. There were no intraoperative complications. All sponge instrument and  needle counts are verified correct at completion of the operation.     Valentina Gu. Roxy Manns MD 11/13/2015 1:57 PM

## 2015-11-13 NOTE — Procedures (Signed)
Extubation Procedure Note  Patient Details:   Name: Oscar Brown DOB: [birth year] MRN: [id number]   Airway Documentation:  Airway 7.5 mm (Active)  Secured at (cm) 24 cm 11/13/2015  2:50 PM  Measured From Lips 11/13/2015  2:50 PM  Secured Location Right 11/13/2015  2:50 PM  Secured By Pink Tape 11/13/2015  2:50 PM  Site Condition Dry 11/13/2015  2:50 PM  Pt extubated to 4lpm , nif -20, vc 1.3L, incentive spirometer achieved 750-1000cc's x10breaths.   Evaluation  O2 sats: stable throughout Complications: No apparent complications Patient did tolerate procedure well. Bilateral Breath Sounds: Clear, Diminished   Yes  Kerry Hough 11/13/2015, 6:34 PM

## 2015-11-13 NOTE — OR Nursing (Signed)
13:15 - 40 minute call to SICU, 14:00 - 2nd call to SICU

## 2015-11-13 NOTE — Transfer of Care (Signed)
Immediate Anesthesia Transfer of Care Note  Patient: Oscar Brown  Procedure(s) Performed: Procedure(s): TRANSESOPHAGEAL ECHOCARDIOGRAM (TEE) (N/A) MINIMALLY INVASIVE MITRAL VALVE (MV) REPLACEMENT (Right)  Patient Location: SICU  Anesthesia Type:General  Level of Consciousness: sedated and Patient remains intubated per anesthesia plan  Airway & Oxygen Therapy: Patient remains intubated per anesthesia plan and Patient placed on Ventilator (see vital sign flow sheet for setting)  Post-op Assessment: Report given to RN and Post -op Vital signs reviewed and stable  Post vital signs: Reviewed and stable  Last Vitals:  Filed Vitals:   11/13/15 0620  BP: 130/79  Pulse: 65  Temp: 37.2 C  Resp: 16    Complications: No apparent anesthesia complications   Pt VSS during transport to ICU. Pt met my ICU RN and RT. Report given to RN/RT and all questions answered. Pt connected to ICU monitors and continued stability confirmed.

## 2015-11-13 NOTE — Brief Op Note (Addendum)
11/13/2015  12:25 PM  PATIENT:  Oscar Brown  49 y.o. 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  MV Stenosis: No mitral valve stenosis.  MV Disease Functional Class: MV Disease Functional Class: Type IIIa.   Etiology (Choose at least one and up to five): Rheumatic.  MV Lesions (Choose at least one): No additional lesions.    Mitral/Tricuspid/Pulmonary Valve Procedure  Mitral Valve Procedure Performed:  Replacement  Posterior Mitral Chords preserved.  Implant: Mechanical Valve: Implant model number F7-033, Size 33, Unique Device Identifier [id number].       COMPLICATIONS: None  BASELINE WEIGHT: 99 kg  PATIENT DISPOSITION:   TO SICU IN STABLE CONDITION  [name], MD 11/13/2015 1:52 PM

## 2015-11-14 ENCOUNTER — Inpatient Hospital Stay (HOSPITAL_COMMUNITY): Payer: Managed Care, Other (non HMO)

## 2015-11-14 ENCOUNTER — Encounter (HOSPITAL_COMMUNITY): Payer: Self-pay | Admitting: Thoracic Surgery (Cardiothoracic Vascular Surgery)

## 2015-11-14 LAB — POCT I-STAT 3, ART BLOOD GAS (G3+)
ACID-BASE DEFICIT: 1 mmol/L (ref 0.0–2.0)
ACID-BASE EXCESS: 2 mmol/L (ref 0.0–2.0)
BICARBONATE: 27.7 meq/L — AB (ref 20.0–24.0)
BICARBONATE: 24.7 meq/L — AB (ref 20.0–24.0)
O2 SAT: 99 %
O2 Saturation: 97 %
PO2 ART: 137 mmHg — AB (ref 80.0–100.0)
TCO2: 29 mmol/L (ref 0–100)
TCO2: 26 mmol/L (ref 0–100)
pCO2 arterial: 42.4 mmHg (ref 35.0–45.0)
pCO2 arterial: 42.2 mmHg (ref 35.0–45.0)
pH, Arterial: 7.419 (ref 7.350–7.450)
pH, Arterial: 7.375 (ref 7.350–7.450)
pO2, Arterial: 83 mmHg (ref 80.0–100.0)

## 2015-11-14 LAB — CBC
HCT: 37.4 % — ABNORMAL LOW (ref 39.0–52.0)
HCT: 39.1 % (ref 39.0–52.0)
HEMOGLOBIN: 12.7 g/dL — AB (ref 13.0–17.0)
HEMOGLOBIN: 12.8 g/dL — AB (ref 13.0–17.0)
MCH: 30.2 pg (ref 26.0–34.0)
MCH: 29.4 pg (ref 26.0–34.0)
MCHC: 34 g/dL (ref 30.0–36.0)
MCHC: 32.7 g/dL (ref 30.0–36.0)
MCV: 88.8 fL (ref 78.0–100.0)
MCV: 89.7 fL (ref 78.0–100.0)
PLATELETS: 133 10*3/uL — AB (ref 150–400)
Platelets: 146 10*3/uL — ABNORMAL LOW (ref 150–400)
RBC: 4.21 MIL/uL — ABNORMAL LOW (ref 4.22–5.81)
RBC: 4.36 MIL/uL (ref 4.22–5.81)
RDW: 12.9 % (ref 11.5–15.5)
RDW: 12.8 % (ref 11.5–15.5)
WBC: 8.8 10*3/uL (ref 4.0–10.5)
WBC: 10.2 10*3/uL (ref 4.0–10.5)

## 2015-11-14 LAB — MAGNESIUM
MAGNESIUM: 2.3 mg/dL (ref 1.7–2.4)
MAGNESIUM: 2.2 mg/dL (ref 1.7–2.4)

## 2015-11-14 LAB — BASIC METABOLIC PANEL
Anion gap: 7 (ref 5–15)
BUN: 10 mg/dL (ref 6–20)
CALCIUM: 8.3 mg/dL — AB (ref 8.9–10.3)
CO2: 23 mmol/L (ref 22–32)
CREATININE: 0.85 mg/dL (ref 0.61–1.24)
Chloride: 106 mmol/L (ref 101–111)
Glucose, Bld: 107 mg/dL — ABNORMAL HIGH (ref 65–99)
Potassium: 4.5 mmol/L (ref 3.5–5.1)
SODIUM: 136 mmol/L (ref 135–145)

## 2015-11-14 LAB — POCT I-STAT, CHEM 8
BUN: 15 mg/dL (ref 6–20)
CREATININE: 0.8 mg/dL (ref 0.61–1.24)
Calcium, Ion: 1.23 mmol/L (ref 1.12–1.23)
Chloride: 106 mmol/L (ref 101–111)
GLUCOSE: 142 mg/dL — AB (ref 65–99)
HCT: 41 % (ref 39.0–52.0)
HEMOGLOBIN: 13.9 g/dL (ref 13.0–17.0)
Potassium: 4.2 mmol/L (ref 3.5–5.1)
Sodium: 136 mmol/L (ref 135–145)
TCO2: 24 mmol/L (ref 0–100)

## 2015-11-14 LAB — GLUCOSE, CAPILLARY
GLUCOSE-CAPILLARY: 115 mg/dL — AB (ref 65–99)
GLUCOSE-CAPILLARY: 116 mg/dL — AB (ref 65–99)
GLUCOSE-CAPILLARY: 109 mg/dL — AB (ref 65–99)
Glucose-Capillary: 92 mg/dL (ref 65–99)
Glucose-Capillary: 92 mg/dL (ref 65–99)
Glucose-Capillary: 140 mg/dL — ABNORMAL HIGH (ref 65–99)

## 2015-11-14 LAB — CREATININE, SERUM
CREATININE: 1.03 mg/dL (ref 0.61–1.24)
GFR calc Af Amer: >60 mL/min (ref >60)

## 2015-11-14 MED ORDER — FUROSEMIDE 10 MG/ML IJ SOLN
20.0000 mg | Freq: Two times a day (BID) | INTRAMUSCULAR | Status: AC
  Administered 2015-11-14 (×2): 20 mg via INTRAVENOUS
  Filled 2015-11-14 (×2): qty 2

## 2015-11-14 MED ORDER — WARFARIN SODIUM 5 MG PO TABS
5.0000 mg | ORAL_TABLET | Freq: Every day | ORAL | Status: DC
  Administered 2015-11-14 – 2015-11-15 (×2): 5 mg via ORAL
  Filled 2015-11-14 (×2): qty 1

## 2015-11-14 MED ORDER — MORPHINE SULFATE (PF) 2 MG/ML IV SOLN
2.0000 mg | INTRAVENOUS | Status: DC | PRN
  Administered 2015-11-14 – 2015-11-17 (×11): 2 mg via INTRAVENOUS
  Filled 2015-11-14 (×11): qty 1

## 2015-11-14 MED ORDER — SODIUM CHLORIDE 0.9 % IJ SOLN
3.0000 mL | Freq: Two times a day (BID) | INTRAMUSCULAR | Status: DC
  Administered 2015-11-14 – 2015-11-24 (×11): 3 mL via INTRAVENOUS

## 2015-11-14 MED ORDER — LISINOPRIL 2.5 MG PO TABS
2.5000 mg | ORAL_TABLET | Freq: Every day | ORAL | Status: DC
  Filled 2015-11-14: qty 1

## 2015-11-14 MED ORDER — SODIUM CHLORIDE 0.9 % IV SOLN
250.0000 mL | INTRAVENOUS | Status: DC | PRN
  Administered 2015-11-18: 250 mL via INTRAVENOUS

## 2015-11-14 MED ORDER — COUMADIN BOOK
Freq: Once | Status: AC
  Administered 2015-11-14: 1
  Filled 2015-11-14: qty 1

## 2015-11-14 MED ORDER — POTASSIUM CHLORIDE CRYS ER 20 MEQ PO TBCR
20.0000 meq | EXTENDED_RELEASE_TABLET | Freq: Every day | ORAL | Status: DC
  Administered 2015-11-15 – 2015-11-16 (×2): 20 meq via ORAL
  Filled 2015-11-14 (×3): qty 1

## 2015-11-14 MED ORDER — FUROSEMIDE 40 MG PO TABS
40.0000 mg | ORAL_TABLET | Freq: Every day | ORAL | Status: DC
  Administered 2015-11-15 – 2015-11-16 (×2): 40 mg via ORAL
  Filled 2015-11-14 (×2): qty 1

## 2015-11-14 MED ORDER — WARFARIN VIDEO
Freq: Once | Status: AC
  Administered 2015-11-14: 1

## 2015-11-14 MED ORDER — MOVING RIGHT ALONG BOOK
Freq: Once | Status: AC
  Administered 2015-11-14: 1
  Filled 2015-11-14: qty 1

## 2015-11-14 MED ORDER — SODIUM CHLORIDE 0.9 % IJ SOLN: 3.0000 mL | INTRAMUSCULAR | Status: DC | PRN

## 2015-11-14 MED ORDER — WARFARIN - PHYSICIAN DOSING INPATIENT
Freq: Every day | Status: DC
  Administered 2015-11-14: 17:00:00

## 2015-11-14 NOTE — Progress Notes (Signed)
Utilization review completed. Miquan Tandon, RN, BSN. 

## 2015-11-14 NOTE — Discharge Summary (Signed)
Physician Discharge Summary  Patient ID: Oscar Brown MRN: [id number] DOB/AGE: 1966/06/02 49 y.o.  Admit date: 11/13/2015 Discharge date: 12/01/2015  Admission Diagnoses:  Patient Active Problem List   Diagnosis Date Noted  . Mitral regurgitation 11/10/2015  . Incidental lung nodule, less than or equal to 52mm 11/10/2015  . Chronic periodontitis 11/05/2015  . Chronic diastolic heart failure ([place])   . Dyspnea 09/22/2015  . Shortness of breath 09/21/2015  . Acute on chronic diastolic heart failure ([place]) 09/21/2015  . Tobacco use disorder 06/05/2015  . Open wound of second toe of right foot 05/29/2015  . Severe mitral regurgitation 10/06/2012  . Preventative health care 10/06/2012  . Obesity, Class II, BMI 35-39.9 12/14/2006  . Obstructive sleep apnea 12/14/2006  . Chronic venous insufficiency 12/14/2006   Discharge Diagnoses:   Patient Active Problem List   Diagnosis Date Noted  . Acute renal failure (Oscar Brown)   . Atrial flutter (Oscar Brown)   . Acute respiratory failure with hypoxia (Oscar Brown) 11/16/2015  . Cardiac arrest (Oscar Brown)   . 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 Mitral regurgitation 11/10/2015  . Incidental lung nodule, less than or equal to 73mm 11/10/2015  . Chronic periodontitis 11/05/2015  . Chronic diastolic heart failure (Oscar Brown)   . Dyspnea 09/22/2015  . Shortness of breath 09/21/2015  . Acute on chronic diastolic heart failure (Oscar Brown) 09/21/2015  . Tobacco use disorder 06/05/2015  . Open wound of second toe of right foot 05/29/2015  . Severe mitral regurgitation 10/06/2012  . Preventative health care 10/06/2012  . Obesity, Class II, BMI 35-39.9 12/14/2006  . Obstructive sleep apnea 12/14/2006  . Chronic venous insufficiency 12/14/2006   Discharged Condition: good  History of Present Illness:  Mr. Oscar Brown is a 49 yo African American Male with OSA, lymphedema and remote h/o DVT who is referred for surgical consultation of  recently discovered severe symptomatic mitral regurgitation. Patient states that he was first noted to have a heart murmur on physical exam 3 years ago by his primary care physician. He has remained physically active and asymptomatic until recently. In October he developed fairly rapid onset symptoms of exertional shortness of breath and orthopnea with mild lower extremity edema. Symptoms progressed until he eventually presented to the hospital where he was hospitalized with acute diastolic congestive heart failure. Transthoracic echocardiogram revealed mitral regurgitation with normal left ventricular systolic function. Ejection fraction was estimated 55-60%. Visually the mitral regurgitation was felt to be moderate to severe, but calculations suggested the presence of only moderate mitral regurgitation with ERO measured 0.19 corresponding to regurgitant volume estimated 39 mL. The patient responded well to diuretic therapy and was seen in follow-up by Dr. Oval Linsey who performed TEE on 10/16/2015 revealed thickened mitral valve leaflets with severely restricted posterior leaflet and severe mitral regurgitation. ERO measured 0.48 corresponding to a regurgitant volume estimated 91 mL and regurgitant fraction estimated 60%. Left ventricular size and systolic function appeared normal. The aortic valve was normal. Right ventricular size and systolic function was normal. There was trivial tricuspid regurgitation. The patient subsequently underwent left and right heart catheterization on 11/03/2015. The patient was found to have mild nonobstructive coronary artery disease and normal pulmonary artery pressures. The patient was referred for elective surgical consultation and evaluated by Dr. Roxy Manns on 11/04/2015 at which time he was in agreement the patient would benefit from surgical intervention on his Mitral Valve. The risks and benefits of the procedure were explained to the patient and he was agreeable  to proceed.   However, prior to proceeding he would require dental clearance.  He was evaluated by Dr. Enrique Sack who performed multiple dental extractions on 11/07/2015.     Hospital Course:   He presented to [facility] on [facility]13[facility].  He was taken to the operating room and underwent Minimally Invasive Mitral Valve Replacement utilizing a 33 mm Sorin Carbomedics Optiform bileaflet mechanical valve prosthesis. He tolerated the procedure well and was taken to the SICU in stable condition.  He was extubated the evening of surgery.  During his stay in the SICU the patient had issues with Bradycardia requiring back up pacing.  He required NTG drip for Hypertension.  This was weaned off on POD #1.  He was ambulating and felt medically stable for transfer to the step down unit in stable condition.  The patient initially made good progress.  However on POD #3 the patient developed nausea and dizziness upon standing.  He denied chest pain and shortness of breath.  Further investigation showed his pacing wire was disconnected.  The patient had underlying CHB and once pacemaker was reconnected he felt much better.  However several hours later patient developed an R on T on his EKG and suffered a V. Fib arrest.  He suffered a grand mal seizure during intubation.  CPR was done for >45 min, but patient was successfully revived and taken back to the SICU in critical condition.  CCM was consulted for further assistance with the patient.  The patient was placed on empiric Zosyn for possible aspiration pneumonia.  He require amiodarone, epinephrine and norepinephrine drips for additional inotropic support.  Echocardiogram was obtained and showed inferior and lateral dyskinesis.  Over the next several days patient progressed.  He was weaned off all drips as tolerated.  He was awake and alert and was able to be extubated on POD # 4.  EP consult was obtained for underlying CHB, who felt patient would likely require PPM for underlying CHB.   He developed Atrial Fibrillation at which time EP recommended cardioversion.  He later converted to NSR with mild 1st degree AV block.  Follow up Echo shows normalization of his EF and EP did not feel PPM would not be indicated.      Neurology consult was also obtained due to seizure suffered during arrest.  EEG was obtained and was normal.  Post arrest patient's creatinine was significantly elevated.  He did not require dialysis, despite a creatinine at 7.93.  This has been trending down and patient has been making urine with twice daily dose of Lasix.  His most recent creatinine is down to 2.07, and he will require close outpatient follow up.  He is on Coumadin for his mechanical valve.  INR is trending upward on a regimen of 7.5 mg daily.  His current INR is 2.52 and the INR should be no less than 2.5.Marland Kitchen  The patient continues to make progress.  He is ambulating independently.  He continues to maintain NSR and his pacing wires have been removed.  He is tolerating a heart healthy diet.  There is still a question as to what was the cause of his cardiac arrest.  Cardiac catheterization will be done as an outpatient once his creatinine is back to baseline level.  Epicardial pacing wires were removed on 11/30/2015. He is medically stable for discharge today 12/01/2015.            Significant Diagnostic Studies: Echocardiogram  - Left ventricle: The cavity size was  normal. Systolic function was normal. The estimated ejection fraction was in the range of 55% to 60%. Wall motion was normal; there were no regional wall motion abnormalities. - Aortic valve: Moderate diffuse thickening and calcification, consistent with sclerosis. - Mitral valve: Mild diffuse calcification of the anterior leaflet. Visually there appears to be moderate to severe regurgitation directed eccentrically and posteriorly on the apical 4 chamber and 2 chamber views. The MR is calculated as mild to mdoerate using the ERO  and MR volume. Consider TEE to assess degree of MR further. - Left atrium: The atrium was severely dilated. - Right ventricle: The cavity size was mildly dilated. Wall thickness was normal. - Pulmonary arteries: PA peak pressure: 35 mm Hg (S).  Impressions:  - The right ventricular systolic pressure was increased consistent with mild pulmonary hypertension.  Treatments: surgery:    Minimally-Invasive Mitral Valve Replacement Sorin Carbomedics Optiform bileaflet mechanical prosthesis (size 29mm, catalog # [id number], serial # [id number])  Disposition: 01-Home or Self Care   Discharge medications:   Medication List    TAKE these medications        acetaminophen 325 MG tablet  Commonly known as:  TYLENOL  Take 2 tablets (650 mg total) by mouth every 6 (six) hours as needed for mild pain.     amiodarone 200 MG tablet  Commonly known as:  PACERONE  Take 1 tablet (200 mg total) by mouth daily.     aspirin 81 MG EC tablet  Take 1 tablet (81 mg total) by mouth daily.     furosemide 40 MG tablet  Commonly known as:  LASIX  Take 1 tablet (40 mg total) by mouth daily.     oxyCODONE 5 MG immediate release tablet  Commonly known as:  Oxy IR/ROXICODONE  Take 1-2 tablets (5-10 mg total) by mouth every 3 (three) hours as needed for severe pain.     warfarin 5 MG tablet  Commonly known as:  COUMADIN  Please take Coumadin 10 mg every Monday, Wednesday, and Friday. Please take Coumadin 7.5 mg every Tuesday, Thursday, Saturday, and Sunday unless directed otherwise       The patient has been discharged on:   1.Beta Blocker:  Yes [   ]                              No   [ x  ]                              If No, reason: previous CHB   2.Ace Inhibitor/ARB: Yes [   ]                                     No  [ x   ]                                     If No, reason: Elevated creatinine  3.Statin:   Yes [   ]                  No  [ x  ]                  If No, reason:  No  CAD  4.Ecasa:  Yes  [ x  ]                  No   [   ]                  If No, reason:  Discharge Instructions    Amb Referral to Cardiac Rehabilitation    Complete by:  As directed   Diagnosis:  Heart Failure (see criteria below)  Heart Failure Type:  Chronic Diastolic             Follow-up Information    Follow up with [name], MD On 12/08/2015.   Specialty:  Cardiothoracic Surgery   Why:  Appointment is at 4:30   Contact information:   [address] [place] [address] [phone]       Follow up with [facility] On 12/07/2014.   Why:  Please get CXR at 4:00   Contact information:   [facility]       Follow up with Sharol Harness, MD.   Specialty:  Cardiology   Why:  Call for a follow up appointment for 2 weeks   Contact information:   [address] [place] [address] [phone]       Follow up with Home Health.   Why:  Please draw PT and INR (as is on Coumadin for mechanical valve) Wednesday 12/03/2015) and call or fax results to Dr. Blenda Mounts office      Follow up with [facility].   Why:  Please draw BMET (as creatinine elevated) on 12/07/2015 and fax results to [phone]      Follow up with Karren Cobble, MD On 12/26/2015.   Specialty:  Internal Medicine   Why:  Appointement time is at 10:45 am. Pre op HGA1C 5.7 and will need further surveillance (pre diatbetic)   Contact information:   [address] [place] [address] [phone]       Signed: Nani Skillern PA-C 12/01/2015, 9:39 AM

## 2015-11-14 NOTE — Progress Notes (Signed)
Report given to [place], [place] 2W.  Pt transported to 2W24 via [facility] with cardiac monitor, Temporary pacemaker, On-Q and  Right CT.

## 2015-11-14 NOTE — Progress Notes (Signed)
[facility] DAILY ICU PROGRESS NOTE                   [address]            [place] [address]          [phone]   1 Day Post-Op Procedure(s) (LRB): TRANSESOPHAGEAL ECHOCARDIOGRAM (TEE) (N/A) MINIMALLY INVASIVE MITRAL VALVE (MV) REPLACEMENT (Right)  Total Length of Stay:  LOS: 1 day   Subjective:  Oscar Brown states he is doing okay.  He has some incisional pain which responds to pain medication.  He had one episode of nausea and vomiting, but is tolerating liquids.  Objective: Vital signs in last 24 hours: Temp:  [95 F (35 C)-99.5 F (37.5 C)] 99.5 F (37.5 C) (12/16 0800) Pulse Rate:  [72-80] 80 (12/16 0800) Cardiac Rhythm:  [-] Atrial paced (12/16 0800) Resp:  [10-23] 20 (12/16 0800) BP: (83-134)/(51-97) 110/67 mmHg (12/16 0800) SpO2:  [97 %-100 %] 100 % (12/16 0800) Arterial Line BP: (95-144)/(54-95) 97/82 mmHg (12/16 0800) FiO2 (%):  [4 %-50 %] 4 % (12/15 2315) Weight:  [222 lb 0.1 oz (100.7 kg)] 222 lb 0.1 oz (100.7 kg) (12/16 0500)  Filed Weights   11/13/15 0620 11/14/15 0500  Weight: 219 lb (99.338 kg) 222 lb 0.1 oz (100.7 kg)    Weight change: 3 lb 0.1 oz (1.362 kg)   Hemodynamic parameters for last 24 hours: PAP: (21-42)/(14-29) 30/26 mmHg CO:  [3.3 L/min-6.9 L/min] 6.9 L/min CI:  [1.6 L/min/m2-3.3 L/min/m2] 3.3 L/min/m2  Intake/Output from previous day: 12/15 0701 - 12/16 0700 In: 5952.8 [I.V.:4770.8; Blood:632; IV Piggyback:550] Out: [id number] [Urine:5520; Blood:1450; Chest Tube:350]  Current Meds: Scheduled Meds: . acetaminophen  1,000 mg Oral 4 times per day  . aspirin EC  325 mg Oral Daily  . bisacodyl  10 mg Oral Daily   Or  . bisacodyl  10 mg Rectal Daily  . cefUROXime (ZINACEF)  IV  1.5 g Intravenous Q12H  . coumadin book   Does not apply Once  . docusate sodium  200 mg Oral Daily  . insulin aspart  0-24 Units Subcutaneous 6 times per day  . metoprolol tartrate  12.5 mg Oral BID  . pantoprazole  40 mg Oral Daily  . sodium  chloride  3 mL Intravenous Q12H  . warfarin  5 mg Oral q1800  . warfarin   Does not apply Once  . Warfarin - Physician Dosing Inpatient   Does not apply q1800   Continuous Infusions: . sodium chloride 250 mL (11/14/15 0547)  . insulin (NOVOLIN-R) infusion 0.5 Units/hr (11/13/15 1513)  . nitroGLYCERIN Stopped (11/13/15 2138)   PRN Meds:.metoprolol, morphine injection, ondansetron (ZOFRAN) IV, oxyCODONE, sodium chloride, traMADol  General appearance: alert, cooperative and no distress Heart: regular rate and rhythm Lungs: diminished breath sounds bibasilar and L>R Abdomen: soft, non-tender; bowel sounds normal; no masses,  no organomegaly Extremities: edema trace L>R Wound: clean and dry  Lab Results: CBC: Recent Labs  11/13/15 2115 11/13/15 2125 11/14/15 0506  WBC 8.2  --  8.8  HGB 13.0 13.9 12.7*  HCT 37.7* 41.0 37.4*  PLT 136*  --  133*   BMET:  Recent Labs  11/11/15 1037  11/13/15 2125 11/14/15 0506  NA 136  < > 136 136  K 3.8  < > 4.2 4.5  CL 103  < > 106 106  CO2 25  --   --  23  GLUCOSE 95  < > 142* 107*  BUN 20  < > 15 10  CREATININE 1.05  < > 0.80 0.85  CALCIUM 9.3  --   --  8.3*  < > = values in this interval not displayed.  PT/INR:  Recent Labs  11/13/15 1450  LABPROT 16.9*  INR 1.36   Radiology: Dg Chest Port 1 View  11/14/2015  CLINICAL DATA:  CHF, mitral regurgitation status post mitral valve replacement EXAM: PORTABLE CHEST 1 VIEW COMPARISON:  Portable chest x-ray of November 13, 2015. FINDINGS: The lungs are adequately inflated. There is improvement in atelectasis on the right. There is persistent left basilar atelectasis. There is no pneumothorax. There small left pleural effusion. The cardiac silhouette remains enlarged. The Swan-Ganz catheter tip projects in a right descending pulmonary artery and may be wedged. The prosthetic mitral valve ring is visible. The upper right-sided chest tube tip projects between the right second and third rib  interspaces. The lower chest tube tip projects at the level of the medial cardiophrenic angle on the right. IMPRESSION: Interval extubation of the trachea and of the esophagus. Improved right basilar atelectasis, persistent left basilar atelectasis and slight increase in pleural fluid on the left. There is no pneumothorax. There is persistent enlargement of the cardiac silhouette. Slightly increased pulmonary vascular congestion. Correlation as to the positioning of the Swan-Ganz catheter tip is needed. It may be wedged in a right lower lobe pulmonary artery. Electronically Signed   By: David  Martinique M.D.   On: 11/14/2015 07:59   Dg Chest Port 1 View  11/13/2015  CLINICAL DATA:  Atelectasis, status post mitral valve replacement EXAM: PORTABLE CHEST - 1 VIEW COMPARISON:  11/11/2015 FINDINGS: Postsurgical changes are now seen. A Swan-Ganz catheter is seen in the right pulmonary artery. An endotracheal tube and nasogastric catheter are noted in satisfactory position. Right thoracostomy catheter is seen without evidence of pneumothorax. Mild bibasilar atelectatic changes are seen. No sizable effusion is noted. IMPRESSION: Mild bibasilar atelectasis. Electronically Signed   By: Inez Catalina M.D.   On: 11/13/2015 15:39     Assessment/Plan: S/P Procedure(s) (LRB): TRANSESOPHAGEAL ECHOCARDIOGRAM (TEE) (N/A) MINIMALLY INVASIVE MITRAL VALVE (MV) REPLACEMENT (Right)  1. Sinus Brady, rate dropped into 40s, continue back up pacer... Off NTG for HTN, will hold beta blocker for now, start coumadin for mechanical valve 2. Pulm- extubated uneventfully, CXR with small left pleural effusion, some atelectasis, continue IS.... CT with 350 cc output will likely stay today 3. Renal- creatinine WNL, weight is minimally elevated 4. Expected post operative blood loss anemia, mild Hgb is at 12.7 5. CBGs controlled, not a diabetic, wean off insulin drip start sliding scale 6. Dispo- patient stable, bradycardic under pacer  will hold beta blocker for now, coumadin for mechanical valve, POD #1 progression orders     Oscar Brown 11/14/2015 8:08 AM

## 2015-11-14 NOTE — Care Management Note (Signed)
Case Management Note  Patient Details  Name: Oscar Brown MRN: [id number] Date of Birth: 08/01/1966  Subjective/Objective:     Patient awake and alert.  Lives at home alone, has GF.  On discharge is plan for his sister, children and GF to be with him 24/7.                Action/Plan:   Expected Discharge Date:                  Expected Discharge Plan:  Home/Self Care  In-House Referral:     Discharge planning Services  CM Consult  Post Acute Care Choice:    Choice offered to:     DME Arranged:    DME Agency:     HH Arranged:    HH Agency:     Status of Service:  In process, will continue to follow  Medicare Important Message Given:    Date Medicare IM Given:    Medicare IM give by:    Date Additional Medicare IM Given:    Additional Medicare Important Message give by:     If discussed at [place] of Stay Meetings, dates discussed:    Additional Comments:  Vergie Living, RN 11/14/2015, 4:18 PM

## 2015-11-14 NOTE — Progress Notes (Addendum)
[address]       [place] [address]             [phone]        CARDIOTHORACIC SURGERY PROGRESS NOTE   R1 Day Post-Op Procedure(s) (LRB): TRANSESOPHAGEAL ECHOCARDIOGRAM (TEE) (N/A) MINIMALLY INVASIVE MITRAL VALVE (MV) REPLACEMENT (Right)  Subjective: Looks great.  Minimal soreness in chest.  No SOB  Objective: Vital signs: BP Readings from Last 1 Encounters:  11/14/15 110/67   Pulse Readings from Last 1 Encounters:  11/14/15 80   Resp Readings from Last 1 Encounters:  11/14/15 20   Temp Readings from Last 1 Encounters:  11/14/15 99.5 F (37.5 C) Core (Comment)    Hemodynamics: PAP: (21-42)/(14-29) 30/26 mmHg CO:  [3.3 L/min-6.9 L/min] 6.9 L/min CI:  [1.6 L/min/m2-3.3 L/min/m2] 3.3 L/min/m2  Physical Exam:  Rhythm:   Sinus brady 40's - AAI pacing  Breath sounds: clear  Heart sounds:  RRR w/out murmur  Incisions:  Dressings dry, intact  Abdomen:  Soft, non-distended, non-tender  Extremities:  Warm, well-perfused  Chest tubes:  Low volume thin serosanguinous output, no air leak    Intake/Output from previous day: 12/15 0701 - 12/16 0700 In: 5952.8 [I.V.:4770.8; Blood:632; IV Piggyback:550] Out: [id number] [Urine:5520; Blood:1450; Chest Tube:350] Intake/Output this shift:    Lab Results:  CBC: Recent Labs  11/13/15 2115 11/13/15 2125 11/14/15 0506  WBC 8.2  --  8.8  HGB 13.0 13.9 12.7*  HCT 37.7* 41.0 37.4*  PLT 136*  --  133*    BMET:  Recent Labs  11/11/15 1037  11/13/15 2125 11/14/15 0506  NA 136  < > 136 136  K 3.8  < > 4.2 4.5  CL 103  < > 106 106  CO2 25  --   --  23  GLUCOSE 95  < > 142* 107*  BUN 20  < > 15 10  CREATININE 1.05  < > 0.80 0.85  CALCIUM 9.3  --   --  8.3*  < > = values in this interval not displayed.   PT/INR:   Recent Labs  11/13/15 1450  LABPROT 16.9*  INR 1.36    CBG (last 3)   Recent Labs  11/13/15 2351 11/14/15 0351 11/14/15 0806  GLUCAP 120* 92 115*    ABG      Component Value Date/Time   PHART 7.375 11/13/2015 2118   PCO2ART 42.2 11/13/2015 2118   PO2ART 137.0* 11/13/2015 2118   HCO3 24.7* 11/13/2015 2118   TCO2 24 11/13/2015 2125   ACIDBASEDEF 1.0 11/13/2015 2118   O2SAT 99.0 11/13/2015 2118    CXR: PORTABLE CHEST 1 VIEW  COMPARISON: Portable chest x-ray of November 13, 2015.  FINDINGS: The lungs are adequately inflated. There is improvement in atelectasis on the right. There is persistent left basilar atelectasis. There is no pneumothorax. There small left pleural effusion. The cardiac silhouette remains enlarged. The Swan-Ganz catheter tip projects in a right descending pulmonary artery and may be wedged. The prosthetic mitral valve ring is visible. The upper right-sided chest tube tip projects between the right second and third rib interspaces. The lower chest tube tip projects at the level of the medial cardiophrenic angle on the right.  IMPRESSION: Interval extubation of the trachea and of the esophagus. Improved right basilar atelectasis, persistent left basilar atelectasis and slight increase in pleural fluid on the left. There is no pneumothorax. There is persistent enlargement of the cardiac silhouette. Slightly increased pulmonary vascular congestion.  Correlation as to the positioning of the Swan-Ganz catheter tip is needed. It may be wedged in a right lower lobe pulmonary artery.   Electronically Signed  By: David Martinique M.D.  On: 11/14/2015 07:59  Assessment/Plan: S/P Procedure(s) (LRB): TRANSESOPHAGEAL ECHOCARDIOGRAM (TEE) (N/A) MINIMALLY INVASIVE MITRAL VALVE (MV) REPLACEMENT (Right)  Doing very well POD1 Maintaining sinus brady - AAI paced rhythm w/ stable hemodynamics, no drips Chronic diastolic CHF with expected post-op volume excess Expected post op acute blood loss anemia, very mild Expected post op atelectasis, mild OSA on CPAP at home for sleep   Mobilize  D/C lines  Leave chest  tubes in at least 1-2 more days  Diuresis  Continue AAI pacing for now  Restart low dose amiodarone 1-2 days if HR improves  Coumadin  Transfer step down   [name], MD 11/14/2015 8:55 AM

## 2015-11-14 NOTE — Progress Notes (Signed)
Patient to self administer CPAP using his machine from home.  RT assisted in setting up the machine, patient's family at bedside also to help.  Patient instructed to contact RN or RT with any concerns.  Patient is familiar with equipment and procedure.

## 2015-11-14 NOTE — Progress Notes (Signed)
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 Will send a phlebotomist for evening labs.

## 2015-11-15 ENCOUNTER — Inpatient Hospital Stay (HOSPITAL_COMMUNITY): Payer: Managed Care, Other (non HMO)

## 2015-11-15 LAB — GLUCOSE, CAPILLARY
GLUCOSE-CAPILLARY: 104 mg/dL — AB (ref 65–99)
GLUCOSE-CAPILLARY: 77 mg/dL (ref 65–99)

## 2015-11-15 LAB — BASIC METABOLIC PANEL
ANION GAP: 6 (ref 5–15)
BUN: 7 mg/dL (ref 6–20)
CHLORIDE: 102 mmol/L (ref 101–111)
CO2: 26 mmol/L (ref 22–32)
Calcium: 8.4 mg/dL — ABNORMAL LOW (ref 8.9–10.3)
Creatinine, Ser: 0.79 mg/dL (ref 0.61–1.24)
Glucose, Bld: 128 mg/dL — ABNORMAL HIGH (ref 65–99)
POTASSIUM: 3.7 mmol/L (ref 3.5–5.1)
SODIUM: 134 mmol/L — AB (ref 135–145)

## 2015-11-15 LAB — PROTIME-INR
INR: 1.38 (ref 0.00–1.49)
Prothrombin Time: 17.1 seconds — ABNORMAL HIGH (ref 11.6–15.2)

## 2015-11-15 LAB — CBC
HEMATOCRIT: 35.4 % — AB (ref 39.0–52.0)
Hemoglobin: 11.6 g/dL — ABNORMAL LOW (ref 13.0–17.0)
MCH: 29.4 pg (ref 26.0–34.0)
MCHC: 32.8 g/dL (ref 30.0–36.0)
MCV: 89.6 fL (ref 78.0–100.0)
Platelets: 132 10*3/uL — ABNORMAL LOW (ref 150–400)
RBC: 3.95 MIL/uL — AB (ref 4.22–5.81)
RDW: 13 % (ref 11.5–15.5)
WBC: 11.2 10*3/uL — AB (ref 4.0–10.5)

## 2015-11-15 MED ORDER — ENOXAPARIN SODIUM 30 MG/0.3ML ~~LOC~~ SOLN
30.0000 mg | SUBCUTANEOUS | Status: DC
  Administered 2015-11-16: 30 mg via SUBCUTANEOUS
  Filled 2015-11-15: qty 0.3

## 2015-11-15 NOTE — Discharge Instructions (Addendum)
Activity: 1.May walk up steps                2.Stop any activity that causes chest pain, shortness of breath, dizziness, sweating or excessive weakness.                3.Avoid straining.                4.Continue with your breathing exercises daily.  Diet: Low fat, Low salt, and low sugar diet  Wound Care: May shower.  Clean wounds with mild soap and water daily. Contact the office at [phone] if any problems arise.     Information on my medicine - Coumadin   (Warfarin)  This medication education was reviewed with me or my healthcare representative as part of my discharge preparation.  The pharmacist that spoke with me during my hospital stay was:  [facility], Margot Chimes, [place]  Why was Coumadin prescribed for you? Coumadin was prescribed for you because you have a blood clot or a medical condition that can cause an increased risk of forming blood clots. Blood clots can cause serious health problems by blocking the flow of blood to the heart, lung, or brain. Coumadin can prevent harmful blood clots from forming. As a reminder your indication for Coumadin is:   Blood Clot Prevention After Heart Valve Surgery  What test will check on my response to Coumadin? While on Coumadin (warfarin) you will need to have an INR test regularly to ensure that your dose is keeping you in the desired range. The INR (international normalized ratio) number is calculated from the result of the laboratory test called prothrombin time (PT).  If an INR APPOINTMENT HAS NOT ALREADY BEEN MADE FOR YOU please schedule an appointment to have this lab work done by your health care provider within 7 days. Your INR goal is usually a number between:  2 to 3 or your provider may give you a more narrow range like 2-2.5.  Ask your health care provider during an office visit what your goal INR is.  What  do you need to  know  About  COUMADIN? Take Coumadin (warfarin) exactly as prescribed by your healthcare provider about  the same time each day.  DO NOT stop taking without talking to the doctor who prescribed the medication.  Stopping without other blood clot prevention medication to take the place of Coumadin may increase your risk of developing a new clot or stroke.  Get refills before you run out.  What do you do if you miss a dose? If you miss a dose, take it as soon as you remember on the same day then continue your regularly scheduled regimen the next day.  Do not take two doses of Coumadin at the same time.  Important Safety Information A possible side effect of Coumadin (Warfarin) is an increased risk of bleeding. You should call your healthcare provider right away if you experience any of the following: ? Bleeding from an injury or your nose that does not stop. ? Unusual colored urine (red or dark brown) or unusual colored stools (red or black). ? Unusual bruising for unknown reasons. ? A serious fall or if you hit your head (even if there is no bleeding).  Some foods or medicines interact with Coumadin (warfarin) and might alter your response to warfarin. To help avoid this: ? Eat a balanced diet, maintaining a consistent amount of Vitamin K. ? Notify your provider about major diet changes you plan to  make. ? Avoid alcohol or limit your intake to 1 drink for women and 2 drinks for men per day. (1 drink is 5 oz. wine, 12 oz. beer, or 1.5 oz. liquor.)  Make sure that ANY health care provider who prescribes medication for you knows that you are taking Coumadin (warfarin).  Also make sure the healthcare provider who is monitoring your Coumadin knows when you have started a new medication including herbals and non-prescription products.  Coumadin (Warfarin)  Major Drug Interactions  Increased Warfarin Effect Decreased Warfarin Effect  Alcohol (large quantities) Antibiotics (esp. Septra/Bactrim, Flagyl, Cipro) Amiodarone (Cordarone) Aspirin (ASA) Cimetidine (Tagamet) Megestrol (Megace) NSAIDs  (ibuprofen, naproxen, etc.) Piroxicam (Feldene) Propafenone (Rythmol SR) Propranolol (Inderal) Isoniazid (INH) Posaconazole (Noxafil) Barbiturates (Phenobarbital) Carbamazepine (Tegretol) Chlordiazepoxide (Librium) Cholestyramine (Questran) Griseofulvin Oral Contraceptives Rifampin Sucralfate (Carafate) Vitamin K   Coumadin (Warfarin) Major Herbal Interactions  Increased Warfarin Effect Decreased Warfarin Effect  Garlic Ginseng Ginkgo biloba Coenzyme Q10 Green tea St. Johns wort    Coumadin (Warfarin) FOOD Interactions  Eat a consistent number of servings per week of foods HIGH in Vitamin K (1 serving =  cup)  Collards (cooked, or boiled & drained) Kale (cooked, or boiled & drained) Mustard greens (cooked, or boiled & drained) Parsley *serving size only =  cup Spinach (cooked, or boiled & drained) Swiss chard (cooked, or boiled & drained) Turnip greens (cooked, or boiled & drained)  Eat a consistent number of servings per week of foods MEDIUM-HIGH in Vitamin K (1 serving = 1 cup)  Asparagus (cooked, or boiled & drained) Broccoli (cooked, boiled & drained, or raw & chopped) Brussel sprouts (cooked, or boiled & drained) *serving size only =  cup Lettuce, raw (green leaf, endive, romaine) Spinach, raw Turnip greens, raw & chopped   These websites have more information on Coumadin (warfarin):  FailFactory.se; VeganReport.com.au;

## 2015-11-15 NOTE — Progress Notes (Signed)
CARDIAC REHAB PHASE I   PRE:  Rate/Rhythm: 80  BP:   Sitting: 118/60     SaO2: 96% RA  MODE:  Ambulation: 350 ft   POST:  Rate/Rhythm: 88  BP:   Sitting: 120/62     SaO2: 96%RA 1155-1239 Pt ambulated 365ft without any complications. Pt is off O2 and SPO2 remained over 90%. Returned pt to recliner with LE elevated. Educated pt and girlfriend on low sodium diet, activity restrictions,risk factors, monitoring weight, and exercise guidelines. Reviewed CHF zone tools and s/s of CHF and when to call MD. Practiced using ISS and corrected techniques to encourage more efficient breathing. Referred pt [facility]. Pt responded well to exercise and education.  Leeanna Slaby D Toneka Fullen,MS,ACSM-RCEP 11/15/2015 1:23 PM

## 2015-11-15 NOTE — Progress Notes (Addendum)
      [address]       ,[place] [address]             [phone]      2 Days Post-Op Procedure(s) (LRB): TRANSESOPHAGEAL ECHOCARDIOGRAM (TEE) (N/A) MINIMALLY INVASIVE MITRAL VALVE (MV) REPLACEMENT (Right)   Subjective:  Mr. Trzcinski states he is doing okay.  He is ambulating  No BM  Objective: Vital signs in last 24 hours: Temp:  [98 F (36.7 C)-99.7 F (37.6 C)] 98 F (36.7 C) (12/17 0559) Pulse Rate:  [79-88] 88 (12/17 0559) Cardiac Rhythm:  [-] A-V Sequential paced (12/16 1955) Resp:  [16-31] 16 (12/17 0559) BP: (87-109)/(58-71) 95/58 mmHg (12/17 0559) SpO2:  [94 %-100 %] 98 % (12/17 0559) Arterial Line BP: (106)/(84) 106/84 mmHg (12/16 0900) Weight:  [222 lb 14.2 oz (101.1 kg)-223 lb 5.2 oz (101.3 kg)] 222 lb 14.2 oz (101.1 kg) (12/17 0559)  Hemodynamic parameters for last 24 hours: PAP: (33)/(27) 33/27 mmHg  Intake/Output from previous day: 12/16 0701 - 12/17 0700 In: 360 [P.O.:360] Out: 1375 [Urine:925; Chest Tube:450]  General appearance: alert, cooperative and no distress Heart: regular rate and rhythm and paced Lungs: clear to auscultation bilaterally Abdomen: soft, non-tender; bowel sounds normal; no masses,  no organomegaly Extremities: edema trace Wound: clean and dry  Lab Results:  Recent Labs  11/14/15 1847 11/15/15 0444  WBC 10.2 11.2*  HGB 12.8* 11.6*  HCT 39.1 35.4*  PLT 146* 132*   BMET:  Recent Labs  11/14/15 0506 11/14/15 1847 11/15/15 0444  NA 136  --  134*  K 4.5  --  3.7  CL 106  --  102  CO2 23  --  26  GLUCOSE 107*  --  128*  BUN 10  --  7  CREATININE 0.85 1.03 0.79  CALCIUM 8.3*  --  8.4*    PT/INR:  Recent Labs  11/15/15 0444  LABPROT 17.1*  INR 1.38   ABG    Component Value Date/Time   PHART 7.375 11/13/2015 2118   HCO3 24.7* 11/13/2015 2118   TCO2 24 11/13/2015 2125   ACIDBASEDEF 1.0 11/13/2015 2118   O2SAT 99.0 11/13/2015 2118   CBG (last 3)   Recent Labs  11/14/15 1953  11/15/15 0023 11/15/15 0508  GLUCAP 109* 77 104*    Assessment/Plan: S/P Procedure(s) (LRB): TRANSESOPHAGEAL ECHOCARDIOGRAM (TEE) (N/A) MINIMALLY INVASIVE MITRAL VALVE (MV) REPLACEMENT (Right)  1. CV-previously sinus Loletha Grayer, appears to be in 1st degree block now- will get 12 lead EKG- continue to hold Amiodarone, Beta Blocker 2. Pulm- off oxygen, CT output 450 cc yesterday- CXR with atelectasis- will leave chest tubes today, continue IS 3. Renal-creatinine WNL, weight is mildly elevated, continue Lasix, potassium at 3.7, continue supplementation 4. Expected post operative blood loss anemia- Hgb now at 11.6 remains stable 5. Dispo- patient stable, HR now appears to be CHB.. Will get 12 lead EKG, INR trending upward, continue coumadin   LOS: 2 days    BARRETT, ERIN 11/15/2015  Patient seen and examined, agree with above ECG this AM showed CHB- continue external pacer for now, if persists will need permanent pacemaker  Angellee Cohill C. Roxan Hockey, MD [facility] [phone]

## 2015-11-15 NOTE — Op Note (Signed)
NAME:  ZIKORA, DANKERS NO.:  [id number]  MEDICAL RECORD NO.:  [id number]  LOCATION:  [facility]                       FACILITY:  [facility]  PHYSICIAN:  Ala Dach, M.D.DATE OF BIRTH:  1966/07/28  DATE OF PROCEDURE:  11/13/2015 DATE OF DISCHARGE:  11/13/2015                              OPERATIVE REPORT   INDICATIONS FOR PROCEDURE:  Mr. Jerardo Markwood is a 49 year old male, patient of Dr. Darylene Price, who presents today for mini mitral repair or replacement to be performed by Dr. Tami Lin.  On the morning of surgery, he was brought to the OR for routine induction of general anesthesia after line placement.  After the induction of general anesthesia, the double-lumen tube was placed.  The TEE probe was prepared, then passed oropharyngeally into the stomach, then slightly withdrawn for imaging of the cardiac structures.  PRE-CARDIOPULMONARY BYPASS TEE EXAMINATION:  Left Ventricle:  The left ventricular chamber seen initially in the short-axis view.  Overall, there was an enlarged left ventricular chamber by volume and size. There was normal wall thickness.  Overall, there was normal-to-vigorous contractile pattern noted in all segmental wall areas.  Both long and short axis views are obtained.  Papillary muscles are well outlined.  No other masses are appreciated.  Mitral Valve:  Further pullback in a four-chamber view of the heart shows mitral valve apparatus with both leaflets that are essentially quite thickened.  They also appeared degenerative in their appearance. The posterior leaflet itself was thickened, but essentially fixed and immobile.  The anterior leaflet showed thickening especially in the distal edges of the leaflet itself.  There was a complete lack of coaptation centrally with a rather large area of lack of coaptation, so there was a large broad-based central jet of mitral regurgitant flow. Multiple views were obtained.  Multiple color  Doppler views obtained showing this large jet of mitral regurgitant flow.  This was rated as severe.  3D images as well were obtained.  Left Atrium:  Normal left atrial chamber noted.  Overall, size was mildly enlarged.  The left atrial appendage was visualized and was clear.  The interatrial septum was interrogated and was intact.  Aortic Valve:  The aortic valve was seen initially in the short-axis view.  It is trileaflet, normal in appearance and in function.  Right Ventricle:  There is a mildly enlarged right ventricular chamber appreciated in the four-chamber view.  The free wall is contractile and appears to be normally so.  Tricuspid Valve:  Thin, compliant mobile tricuspid valve is appreciated. There is a satisfactory coaptation point.  Color Doppler reveals only trivial tricuspid regurgitant flow.  Right Atrium:  This is a normal right atrial chamber in its size. Pulmonary artery catheter is noted within.  The patient was placed on cardiopulmonary bypass.  The mini approach was performed.  The valve itself could not be repaired with ring angioplasty, so it was excised and a mechanical bivalve apparatus was placed in the mitral position.  De-airing maneuvers were carried out and the patient was separated from the cardiopulmonary bypass with the initial attempt.  POST-CARDIOPULMONARY BYPASS TEE-DIRECTED EXAM:  Mitral Valve:  The mitral valve area is seen now in this four-chamber  view.  There is a mechanical bileaflet mitral valve in the mitral position.  It appears to be well situated.  The leaflets are easily visualized.  They opened satisfactorily during diastolic inflow and closed appropriately during systolic ejection.  There are small washing jets noted at the peripheral edges of the valve itself.  There are no perivalvular leaks appreciated. This appears to be satisfactory replacement of the mitral valve with this mechanical bileaflet apparatus.  Left ventricle:   The left ventricular chamber seen in the short-axis view early in bypass.  There is a nice septal bounce appreciated with some dyssynergy noted in the septal inferior wall area.  Overall, the rest of the cardiac examination shows lateral and anterior walls with excellent contractility and pattern with time and separation of cardiopulmonary bypass.  The overall contractile pattern of the left ventricular chamber improved.  At the end of the procedure, there was vigorous and satisfactory contractility appreciated in all the segmental wall areas of the left ventricular chamber itself.  The rest of the cardiac examination was as previously described.  The patient was then returned to the cardiac intensive care unit in stable condition.          ______________________________ Ala Dach, M.D.     JTM/MEDQ  D:  11/14/2015  T:  11/15/2015  Job:  [id number]

## 2015-11-15 NOTE — Progress Notes (Signed)
Pt ambulated 550 ft on RA, pt tolerated well.  Pt resting comfortably with call bell within reach.  Claudette Stapler, RN

## 2015-11-15 NOTE — Progress Notes (Signed)
Pt ambulated 600 ft on RA, pt tolerated well. Pt resting comfortably with call bell within reach. Claudette Stapler, RN

## 2015-11-16 ENCOUNTER — Inpatient Hospital Stay (HOSPITAL_COMMUNITY): Payer: Managed Care, Other (non HMO)

## 2015-11-16 ENCOUNTER — Encounter (HOSPITAL_COMMUNITY): Payer: Self-pay | Admitting: Certified Registered"

## 2015-11-16 ENCOUNTER — Other Ambulatory Visit (HOSPITAL_COMMUNITY): Payer: Self-pay

## 2015-11-16 DIAGNOSIS — I469 Cardiac arrest, cause unspecified: Secondary | ICD-10-CM

## 2015-11-16 DIAGNOSIS — Z954 Presence of other heart-valve replacement: Secondary | ICD-10-CM

## 2015-11-16 DIAGNOSIS — Z8674 Personal history of sudden cardiac arrest: Secondary | ICD-10-CM | POA: Diagnosis present

## 2015-11-16 DIAGNOSIS — J9601 Acute respiratory failure with hypoxia: Secondary | ICD-10-CM

## 2015-11-16 LAB — LACTIC ACID, PLASMA
Lactic Acid, Venous: 15.1 mmol/L (ref 0.5–2.0)
Lactic Acid, Venous: 8.9 mmol/L (ref 0.5–2.0)
Lactic Acid, Venous: 2.3 mmol/L (ref 0.5–2.0)

## 2015-11-16 LAB — GLUCOSE, CAPILLARY
GLUCOSE-CAPILLARY: 296 mg/dL — AB (ref 65–99)
GLUCOSE-CAPILLARY: 233 mg/dL — AB (ref 65–99)
Glucose-Capillary: 157 mg/dL — ABNORMAL HIGH (ref 65–99)

## 2015-11-16 LAB — COMPREHENSIVE METABOLIC PANEL
ALBUMIN: 1.5 g/dL — AB (ref 3.5–5.0)
ALK PHOS: 314 U/L — AB (ref 38–126)
ALK PHOS: 443 U/L — AB (ref 38–126)
ALT: 77 U/L — AB (ref 17–63)
ALT: 323 U/L — AB (ref 17–63)
ANION GAP: 9 (ref 5–15)
AST: 213 U/L — AB (ref 15–41)
AST: 864 U/L — ABNORMAL HIGH (ref 15–41)
Albumin: 1.9 g/dL — ABNORMAL LOW (ref 3.5–5.0)
Anion gap: 20 — ABNORMAL HIGH (ref 5–15)
BILIRUBIN TOTAL: 0.4 mg/dL (ref 0.3–1.2)
BUN: 10 mg/dL (ref 6–20)
BUN: 21 mg/dL — ABNORMAL HIGH (ref 6–20)
CALCIUM: 6.6 mg/dL — AB (ref 8.9–10.3)
CALCIUM: 7.2 mg/dL — AB (ref 8.9–10.3)
CO2: 18 mmol/L — AB (ref 22–32)
CO2: 27 mmol/L (ref 22–32)
CREATININE: 1.15 mg/dL (ref 0.61–1.24)
CREATININE: 2.08 mg/dL — AB (ref 0.61–1.24)
Chloride: 103 mmol/L (ref 101–111)
Chloride: 102 mmol/L (ref 101–111)
GFR calc Af Amer: >60 mL/min (ref >60)
GFR calc non Af Amer: >60 mL/min (ref >60)
GFR, EST AFRICAN AMERICAN: 41 mL/min — AB (ref >60)
GFR, EST NON AFRICAN AMERICAN: 36 mL/min — AB (ref >60)
GLUCOSE: 291 mg/dL — AB (ref 65–99)
Glucose, Bld: 120 mg/dL — ABNORMAL HIGH (ref 65–99)
Potassium: 3.5 mmol/L (ref 3.5–5.1)
Potassium: 3.8 mmol/L (ref 3.5–5.1)
SODIUM: 141 mmol/L (ref 135–145)
SODIUM: 138 mmol/L (ref 135–145)
TOTAL PROTEIN: 3.3 g/dL — AB (ref 6.5–8.1)
TOTAL PROTEIN: 4.5 g/dL — AB (ref 6.5–8.1)
Total Bilirubin: 0.6 mg/dL (ref 0.3–1.2)

## 2015-11-16 LAB — POCT I-STAT 3, ART BLOOD GAS (G3+)
ACID-BASE DEFICIT: 13 mmol/L — AB (ref 0.0–2.0)
Acid-base deficit: 13 mmol/L — ABNORMAL HIGH (ref 0.0–2.0)
Acid-base deficit: 5 mmol/L — ABNORMAL HIGH (ref 0.0–2.0)
Acid-base deficit: 2 mmol/L (ref 0.0–2.0)
BICARBONATE: 16.9 meq/L — AB (ref 20.0–24.0)
BICARBONATE: 19.8 meq/L — AB (ref 20.0–24.0)
BICARBONATE: 20.6 meq/L (ref 20.0–24.0)
BICARBONATE: 22 meq/L (ref 20.0–24.0)
O2 SAT: 38 %
O2 SAT: 88 %
O2 Saturation: 99 %
O2 Saturation: 98 %
PH ART: 7.409 (ref 7.350–7.450)
PO2 ART: 154 mmHg — AB (ref 80.0–100.0)
PO2 ART: 100 mmHg (ref 80.0–100.0)
TCO2: 19 mmol/L (ref 0–100)
TCO2: 23 mmol/L (ref 0–100)
TCO2: 22 mmol/L (ref 0–100)
TCO2: 23 mmol/L (ref 0–100)
pCO2 arterial: 57.2 mmHg (ref 35.0–45.0)
pCO2 arterial: 94 mmHg (ref 35.0–45.0)
pCO2 arterial: 36.5 mmHg (ref 35.0–45.0)
pCO2 arterial: 34.5 mmHg — ABNORMAL LOW (ref 35.0–45.0)
pH, Arterial: 6.933 — CL (ref 7.350–7.450)
pH, Arterial: 7.079 — CL (ref 7.350–7.450)
pH, Arterial: 7.355 (ref 7.350–7.450)
pO2, Arterial: 36 mmHg — CL (ref 80.0–100.0)
pO2, Arterial: 75 mmHg — ABNORMAL LOW (ref 80.0–100.0)

## 2015-11-16 LAB — URINE MICROSCOPIC-ADD ON

## 2015-11-16 LAB — TROPONIN I
TROPONIN I: 10.64 ng/mL — AB (ref <0.031)
Troponin I: 2.95 ng/mL (ref <0.031)

## 2015-11-16 LAB — URINALYSIS, ROUTINE W REFLEX MICROSCOPIC
Glucose, UA: NEGATIVE mg/dL
KETONES UR: NEGATIVE mg/dL
LEUKOCYTES UA: NEGATIVE
NITRITE: NEGATIVE
PH: 6 (ref 5.0–8.0)
Protein, ur: 30 mg/dL — AB
SPECIFIC GRAVITY, URINE: 1.023 (ref 1.005–1.030)

## 2015-11-16 LAB — CBC
HCT: 31.6 % — ABNORMAL LOW (ref 39.0–52.0)
Hemoglobin: 9.9 g/dL — ABNORMAL LOW (ref 13.0–17.0)
MCH: 29.2 pg (ref 26.0–34.0)
MCHC: 31.3 g/dL (ref 30.0–36.0)
MCV: 93.2 fL (ref 78.0–100.0)
PLATELETS: 126 10*3/uL — AB (ref 150–400)
RBC: 3.39 MIL/uL — AB (ref 4.22–5.81)
RDW: 13.1 % (ref 11.5–15.5)
WBC: 9.2 10*3/uL (ref 4.0–10.5)

## 2015-11-16 LAB — CK TOTAL AND CKMB (NOT AT ARMC)
CK, MB: 7 ng/mL — AB (ref 0.5–5.0)
Relative Index: 4.4 — ABNORMAL HIGH (ref 0.0–2.5)
Total CK: 158 U/L (ref 49–397)

## 2015-11-16 LAB — PROTIME-INR
INR: 1.26 (ref 0.00–1.49)
INR: 1.69 — ABNORMAL HIGH (ref 0.00–1.49)
PROTHROMBIN TIME: 15.9 s — AB (ref 11.6–15.2)
PROTHROMBIN TIME: 19.8 s — AB (ref 11.6–15.2)

## 2015-11-16 LAB — HEPARIN LEVEL (UNFRACTIONATED)

## 2015-11-16 MED ORDER — AMIODARONE HCL IN DEXTROSE 360-4.14 MG/200ML-% IV SOLN
30.0000 mg/h | INTRAVENOUS | Status: DC
  Administered 2015-11-17: 30 mg/h via INTRAVENOUS
  Filled 2015-11-16: qty 200

## 2015-11-16 MED ORDER — LORAZEPAM 2 MG/ML IJ SOLN
INTRAMUSCULAR | Status: AC
  Filled 2015-11-16: qty 1

## 2015-11-16 MED ORDER — FUROSEMIDE 10 MG/ML IJ SOLN
40.0000 mg | Freq: Once | INTRAMUSCULAR | Status: AC
  Administered 2015-11-16: 40 mg via INTRAVENOUS
  Filled 2015-11-16: qty 4

## 2015-11-16 MED ORDER — ALBUTEROL SULFATE (2.5 MG/3ML) 0.083% IN NEBU
INHALATION_SOLUTION | RESPIRATORY_TRACT | Status: AC
  Administered 2015-11-16: 2.5 mg
  Filled 2015-11-16: qty 3

## 2015-11-16 MED ORDER — VANCOMYCIN HCL IN DEXTROSE 750-5 MG/150ML-% IV SOLN
750.0000 mg | Freq: Two times a day (BID) | INTRAVENOUS | Status: DC
  Administered 2015-11-17: 750 mg via INTRAVENOUS
  Filled 2015-11-16 (×2): qty 150

## 2015-11-16 MED ORDER — NOREPINEPHRINE BITARTRATE 1 MG/ML IV SOLN
2.0000 ug/min | INTRAVENOUS | Status: DC
  Administered 2015-11-16: 40 ug/min via INTRAVENOUS
  Administered 2015-11-16: 10 ug/min via INTRAVENOUS
  Administered 2015-11-16: 30 ug/min via INTRAVENOUS
  Filled 2015-11-16 (×2): qty 4

## 2015-11-16 MED ORDER — HEPARIN (PORCINE) IN NACL 100-0.45 UNIT/ML-% IJ SOLN
2050.0000 [IU]/h | INTRAMUSCULAR | Status: DC
  Administered 2015-11-16: 800 [IU]/h via INTRAVENOUS
  Administered 2015-11-17: 1150 [IU]/h via INTRAVENOUS
  Administered 2015-11-18: 1500 [IU]/h via INTRAVENOUS
  Administered 2015-11-19: 1700 [IU]/h via INTRAVENOUS
  Administered 2015-11-19: 1650 [IU]/h via INTRAVENOUS
  Administered 2015-11-20: 1700 [IU]/h via INTRAVENOUS
  Administered 2015-11-21: 1800 [IU]/h via INTRAVENOUS
  Administered 2015-11-22: 2050 [IU]/h via INTRAVENOUS
  Administered 2015-11-22 – 2015-11-23 (×2): 2000 [IU]/h via INTRAVENOUS
  Administered 2015-11-24: 2050 [IU]/h via INTRAVENOUS
  Filled 2015-11-16 (×20): qty 250

## 2015-11-16 MED ORDER — SODIUM BICARBONATE 8.4 % IV SOLN
INTRAVENOUS | Status: DC
  Administered 2015-11-16 (×2): via INTRAVENOUS
  Filled 2015-11-16 (×3): qty 150

## 2015-11-16 MED ORDER — EPINEPHRINE HCL 1 MG/ML IJ SOLN
0.5000 ug/min | INTRAMUSCULAR | Status: DC
  Administered 2015-11-16: 5 ug/min via INTRAVENOUS
  Administered 2015-11-16: 30 ug/min via INTRAVENOUS
  Filled 2015-11-16: qty 4

## 2015-11-16 MED ORDER — DOPAMINE-DEXTROSE 3.2-5 MG/ML-% IV SOLN
3.0000 ug/kg/min | INTRAVENOUS | Status: DC
  Administered 2015-11-16: 3 ug/kg/min via INTRAVENOUS
  Filled 2015-11-16: qty 250

## 2015-11-16 MED ORDER — PIPERACILLIN-TAZOBACTAM 3.375 G IVPB: 3.3750 g | Freq: Four times a day (QID) | INTRAVENOUS | Status: DC

## 2015-11-16 MED ORDER — SODIUM BICARBONATE 8.4 % IV SOLN
INTRAVENOUS | Status: AC
  Administered 2015-11-16: 50 meq
  Filled 2015-11-16: qty 50

## 2015-11-16 MED ORDER — SODIUM CHLORIDE 0.9 % IV SOLN
INTRAVENOUS | Status: DC
  Administered 2015-11-16: 500 mL via INTRAVENOUS

## 2015-11-16 MED ORDER — EPINEPHRINE HCL 0.1 MG/ML IJ SOSY
0.5000 mg | PREFILLED_SYRINGE | Freq: Once | INTRAMUSCULAR | Status: AC
  Administered 2015-11-16: 0.5 mg via INTRAVENOUS

## 2015-11-16 MED ORDER — CHLORHEXIDINE GLUCONATE 0.12% ORAL RINSE (MEDLINE KIT)
15.0000 mL | Freq: Two times a day (BID) | OROMUCOSAL | Status: DC
  Administered 2015-11-16 – 2015-11-17 (×2): 15 mL via OROMUCOSAL

## 2015-11-16 MED ORDER — POTASSIUM CHLORIDE 10 MEQ/50ML IV SOLN
10.0000 meq | INTRAVENOUS | Status: AC
  Administered 2015-11-16 (×4): 10 meq via INTRAVENOUS
  Filled 2015-11-16 (×4): qty 50

## 2015-11-16 MED ORDER — SODIUM CHLORIDE 0.45 % IV SOLN
INTRAVENOUS | Status: DC
  Administered 2015-11-16: 22:00:00 via INTRAVENOUS
  Administered 2015-11-17: 10 mL/h via INTRAVENOUS

## 2015-11-16 MED ORDER — INSULIN ASPART 100 UNIT/ML ~~LOC~~ SOLN
0.0000 [IU] | SUBCUTANEOUS | Status: DC
  Administered 2015-11-16: 7 [IU] via SUBCUTANEOUS
  Administered 2015-11-16: 4 [IU] via SUBCUTANEOUS
  Administered 2015-11-16: 11 [IU] via SUBCUTANEOUS
  Administered 2015-11-18: 3 [IU] via SUBCUTANEOUS

## 2015-11-16 MED ORDER — SODIUM BICARBONATE 8.4 % IV SOLN
150.0000 meq | Freq: Once | INTRAVENOUS | Status: AC
  Administered 2015-11-16: 150 meq via INTRAVENOUS

## 2015-11-16 MED ORDER — PIPERACILLIN-TAZOBACTAM 3.375 G IVPB
3.3750 g | Freq: Three times a day (TID) | INTRAVENOUS | Status: DC
  Administered 2015-11-16 – 2015-11-19 (×9): 3.375 g via INTRAVENOUS
  Filled 2015-11-16 (×12): qty 50

## 2015-11-16 MED ORDER — AMIODARONE HCL IN DEXTROSE 360-4.14 MG/200ML-% IV SOLN
60.0000 mg/h | INTRAVENOUS | Status: DC
  Administered 2015-11-16 (×2): 60 mg/h via INTRAVENOUS
  Filled 2015-11-16: qty 200

## 2015-11-16 MED ORDER — LIDOCAINE HCL (CARDIAC) 20 MG/ML IV SOLN
100.0000 mg | Freq: Once | INTRAVENOUS | Status: AC
  Administered 2015-11-16: 100 mg via INTRAVENOUS

## 2015-11-16 MED ORDER — ANTISEPTIC ORAL RINSE SOLUTION (CORINZ)
7.0000 mL | Freq: Four times a day (QID) | OROMUCOSAL | Status: DC
  Administered 2015-11-17 (×3): 7 mL via OROMUCOSAL

## 2015-11-16 MED ORDER — AMIODARONE IV BOLUS ONLY 150 MG/100ML
150.0000 mg | Freq: Once | INTRAVENOUS | Status: AC
Start: 2015-11-16 — End: 2015-11-16
  Administered 2015-11-16: 150 mg via INTRAVENOUS

## 2015-11-16 MED ORDER — VANCOMYCIN HCL 10 G IV SOLR
1500.0000 mg | Freq: Once | INTRAVENOUS | Status: AC
  Administered 2015-11-16: 1500 mg via INTRAVENOUS
  Filled 2015-11-16: qty 1500

## 2015-11-16 NOTE — Progress Notes (Signed)
ANTICOAGULATION CONSULT NOTE - Follow Up Consult  Pharmacy Consult for heparin Indication: MVR   Labs:  Recent Labs  11/14/15 1847 11/15/15 0444 11/16/15 0230 11/16/15 1110 11/16/15 1500 11/16/15 2125 11/16/15 2238  HGB 12.8* 11.6*  --  9.9*  --   --   --   HCT 39.1 35.4*  --  31.6*  --   --   --   PLT 146* 132*  --  126*  --   --   --   LABPROT  --  17.1* 15.9* 19.8*  --   --   --   INR  --  1.38 1.26 1.69*  --   --   --   HEPARINUNFRC  --   --   --   --   --  <0.10*  --   CREATININE 1.03 0.79  --  1.15  --   --  2.08*  CKTOTAL  --   --   --  158  --   --   --   CKMB  --   --   --  7.0*  --   --   --   TROPONINI  --   --   --  2.95* 10.64*  --  >65.00*     Assessment: 49yo male undetectable on heparin with initial conservative dosing for MVR.  Goal of Therapy:  Heparin level 0.3-0.35 units/ml   Plan:  Will increase heparin gtt by 2 units/kg/hr to 1000 units/hr and check level in [place], PharmD, BCPS  11/16/2015,11:40 PM

## 2015-11-16 NOTE — Progress Notes (Signed)
ANTICOAGULATION CONSULT NOTE - Initial Consult  Pharmacy Consult for Heparin Indication: MVR  Allergies  Allergen Reactions  . Iohexol Hives and Itching    Patient needs 13 hour prep per Dr. Pascal Lux, after cath he broke out in hives and had itching     Patient Measurements: Weight: 225 lb 9.6 oz (102.331 kg) Heparin Dosing Weight: 90.5 kg  Vital Signs: Temp: 97.2 F (36.2 C) (12/18 1234) Temp Source: Oral (12/18 1234) BP: 98/63 mmHg (12/18 0426) Pulse Rate: 79 (12/18 0426)  Labs:  Recent Labs  11/14/15 1847 11/15/15 0444 11/16/15 0230 11/16/15 1110  HGB 12.8* 11.6*  --  9.9*  HCT 39.1 35.4*  --  31.6*  PLT 146* 132*  --  126*  LABPROT  --  17.1* 15.9* 19.8*  INR  --  1.38 1.26 1.69*  CREATININE 1.03 0.79  --  1.15  CKTOTAL  --   --   --  158  CKMB  --   --   --  7.0*  TROPONINI  --   --   --  2.95*    Estimated Creatinine Clearance: 90.1 mL/min (by C-G formula based on Cr of 1.15).   Medical History: Past Medical History  Diagnosis Date  . 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  Complicated by chronic left lower extremity edema and occasional recurrent left lower extremity cellulitis   . Obstructive sleep apnea 12/14/2006    AHI 19.2/hr, desaturation to 75% on Polysomnography 02/26/2005.  Uses 11 cm H2O nocturnal nasal CPAP with good symptom control.   . Mitral regurgitation 10/06/2012  . Lymphedema early 52's  . DVT (deep venous thrombosis) ([place]) early 80's    left leg  . Incidental lung nodule, less than or equal to 69mm 11/10/2015    3 mm nodule right lung  . Acute diastolic congestive heart failure ([place]) 09/21/2015    takes Lasix daily  . 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  amiodarone (PACERONE) 200 MG tablet Take 1 tablet (200 mg total) by mouth 2 (two) times daily. 60 tablet 0 11/13/2015 at Unknown time  . furosemide (LASIX) 20 MG tablet Take 1 tablet (20 mg total) by mouth daily. 30 tablet 3 11/12/2015 at Unknown time   Scheduled:  . acetaminophen  1,000 mg Oral 4 times per day  . aspirin EC  325 mg Oral Daily  . bisacodyl  10 mg Oral Daily   Or  . bisacodyl  10 mg Rectal Daily  . docusate sodium  200 mg Oral Daily  . insulin aspart  0-20 Units Subcutaneous 6 times per day  . LORazepam      . pantoprazole  40 mg Oral Daily  . piperacillin-tazobactam (ZOSYN)  IV  3.375 g Intravenous Q8H  . potassium chloride  10 mEq Intravenous Q1 Hr x 4  . potassium chloride  20 mEq Oral Daily  . sodium chloride  3 mL Intravenous Q12H  . sodium chloride  3 mL Intravenous Q12H  . warfarin  5 mg Oral q1800  . Warfarin - Physician Dosing Inpatient   Does not apply q1800   Infusions:  . sodium chloride 250 mL (11/14/15 0547)  . amiodarone 60 mg/hr (11/16/15 1100)  . amiodarone    . epinephrine  30 mcg/min (11/16/15 1100)  . heparin    . norepinephrine (LEVOPHED) Adult infusion 30 mcg/min (11/16/15 1230)  .  sodium bicarbonate  infusion 1000 mL 100 mL/hr at 11/16/15 1200    Assessment: 49yo male with history of HTN, OSA, DVT and here for minimally invasive MVR. Pharmacy is consulted to dose heparin for MVR. Dr. Roxan Hockey wants to dose conservatively with 800 units/hr initially and a lower goal.   Goal of Therapy:  Heparin level 0.3-0.35 units/ml Monitor platelets by anticoagulation protocol: Yes   Plan:  Start heparin infusion at 800 units/hr Check anti-Xa level in 6 hours and daily while on heparin Continue to monitor H&H and platelets  Andrey Cota. Diona Foley, PharmD, [place] Clinical Pharmacist Pager [phone] 11/16/2015,2:51 PM

## 2015-11-16 NOTE — Progress Notes (Signed)
      [address]       [place] [address]             [phone]      Oscar Brown is waking up appropriately and following commands, moving all 4 extremities  He is DOO paced at 80 with no ectopy on amiodarone He is hemodynamically stable on Epi @ 10 and Norepi @ 20  Dr. Haroldine Laws reviewed the echo and noted inferior and lateral dyskinesis.  He does not feel catheterization will benefit at this point.  He did not have significant CAD. There is concern that he could have had an embolic event. His INR is not yet therapeutic at 1.67.  Will start heparin drip without a bolus until INR > 2.0  Remo Lipps C. Roxan Hockey, MD [facility] [phone]

## 2015-11-16 NOTE — Progress Notes (Addendum)
      [address]       [place] [address]             [phone]      3 Days Post-Op Procedure(s) (LRB): TRANSESOPHAGEAL ECHOCARDIOGRAM (TEE) (N/A) MINIMALLY INVASIVE MITRAL VALVE (MV) REPLACEMENT (Right)  Subjective:  Mr. Glessner doesn't feel as well this morning.  He complains of nausea and dizziness after standing to go to the bathroom.  Nursing called me into room due to pacer not functioning.  His wire had come disconnected from the pacer cable.  I reconnected wire and patient felt better.   Objective: Vital signs in last 24 hours: Temp:  [97.9 F (36.6 C)-99.6 F (37.6 C)] 98.6 F (37 C) (12/18 0426) Pulse Rate:  [79-80] 79 (12/18 0426) Cardiac Rhythm:  [-] A-V Sequential paced (12/17 2014) Resp:  [18-19] 18 (12/18 0426) BP: (81-132)/(48-88) 98/63 mmHg (12/18 0426) SpO2:  [95 %-100 %] 95 % (12/18 0426) Weight:  [225 lb 9.6 oz (102.331 kg)] 225 lb 9.6 oz (102.331 kg) (12/18 0426)  Intake/Output from previous day: 12/17 0701 - 12/18 0700 In: 120 [P.O.:120] Out: 1515 [Urine:1325; Chest Tube:190]  General appearance: alert, cooperative and no distress Heart: regular rate and rhythm and paced Lungs: clear to auscultation bilaterally Abdomen: soft, non-tender; bowel sounds normal; no masses,  no organomegaly Extremities: edema trace L>R Wound: clean and dry  Lab Results:  Recent Labs  11/14/15 1847 11/15/15 0444  WBC 10.2 11.2*  HGB 12.8* 11.6*  HCT 39.1 35.4*  PLT 146* 132*   BMET:  Recent Labs  11/14/15 0506 11/14/15 1847 11/15/15 0444  NA 136  --  134*  K 4.5  --  3.7  CL 106  --  102  CO2 23  --  26  GLUCOSE 107*  --  128*  BUN 10  --  7  CREATININE 0.85 1.03 0.79  CALCIUM 8.3*  --  8.4*    PT/INR:  Recent Labs  11/16/15 0230  LABPROT 15.9*  INR 1.26   ABG    Component Value Date/Time   PHART 7.375 11/13/2015 2118   HCO3 24.7* 11/13/2015 2118   TCO2 24 11/13/2015 2125   ACIDBASEDEF 1.0 11/13/2015 2118   O2SAT 99.0  11/13/2015 2118   CBG (last 3)   Recent Labs  11/14/15 1953 11/15/15 0023 11/15/15 0508  GLUCAP 109* 77 104*    Assessment/Plan: S/P Procedure(s) (LRB): TRANSESOPHAGEAL ECHOCARDIOGRAM (TEE) (N/A) MINIMALLY INVASIVE MITRAL VALVE (MV) REPLACEMENT (Right)  1. CV- pacing wire disconnected this morning, CHB under pacer- patient very symptomatic when pacer was disconnected.... Continue to hold amiodarone and beta blocker, hypotension will d/c lisinopril- consult EP tomorrow for pacer? 2. Pulm- no acute issues, off oxygen, continue IS 3. INR 1.26, decrease from 1.38, will repeat dose at 5 mg if no response can increase dose to 7.5 mg 4. Renal-  Creatinine WNL, remains hypervolemic, continue Lasix 5. Dispo- patient with CHB, very symptomatic when pacer became disconnected this morning... EP consult in AM?, continue current care   LOS: 3 days    Ahmed Prima, [facility] 11/16/2015

## 2015-11-16 NOTE — Consult Note (Signed)
PULMONARY / CRITICAL CARE MEDICINE   Name: Oscar Brown MRN: [id number] DOB: [birth year]    ADMISSION DATE:  11/13/2015 CONSULTATION DATE:  12/18  REFERRING MD:  Roxan Hockey   CHIEF COMPLAINT:  Post arrest   HISTORY OF PRESENT ILLNESS:   49yo male with hx HTN, OSA, remote hx DVT, mitral regurg admitted 12/15 for minimally invasive mitral valve replacement.  Post op course c/b difficulty with pacer wires and complete heart block, resolved with manipulation of wires and d/c of amiodarone and B blockers.  He was doing well on med-surg, ambulating and nearing ready for d/c when he had sudden cardiac arrest.  Initial rhythm VFib with prolonged downtime >45 minutes.  Now in SICU with persistent severe metabolic acidosis, persistent shock and ongoing arrhythmias.  Bedside echo with dismal EF per cards.   PCCM consulted to assist with vent management.    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  PAST SURGICAL HISTORY: He  has past surgical history that includes TEE without cardioversion (N/A, 10/16/2015); Cardiac catheterization (N/A, 11/03/2015); Multiple extractions with alveoloplasty (N/A, 11/07/2015); TEE without cardioversion (N/A, 11/13/2015); and Mitral valve replacement (Right, 11/13/2015).  Allergies  Allergen Reactions  . Iohexol Hives and Itching    Patient needs 13 hour prep per Dr. Pascal Lux, after cath he broke out in hives and had itching     No current facility-administered medications on file prior to encounter.   Current Outpatient Prescriptions  on File Prior to Encounter  Medication Sig  . furosemide (LASIX) 20 MG tablet Take 1 tablet (20 mg total) by mouth daily.    FAMILY HISTORY:  His indicated that his mother is deceased. He indicated that his father is deceased.   SOCIAL HISTORY: He  reports that he quit smoking about 3 years ago. His smoking use included Cigarettes. He has a .9 pack-year smoking history. He has never used smokeless tobacco. He reports that he does not drink alcohol or use illicit drugs.  REVIEW OF SYSTEMS:   Unable.  As per HPI.   SUBJECTIVE:    VITAL SIGNS: BP 98/63 mmHg  Pulse 79  Temp(Src) 98.6 F (37 C) (Oral)  Resp 18  Wt 225 lb 9.6 oz (102.331 kg)  SpO2 99%  HEMODYNAMICS:    VENTILATOR SETTINGS: Vent Mode:  [-] PRVC FiO2 (%):  [100 %] 100 % Set Rate:  [26 bmp-28 bmp] 26 bmp Vt Set:  [550 mL] 550 mL PEEP:  [5 cmH20] 5 cmH20 Plateau Pressure:  [27 cmH20] 27 cmH20  INTAKE / OUTPUT: I/O last 3 completed shifts: In: 240 [P.O.:240] Out: 2030 [Urine:1750; Chest Tube:280]  PHYSICAL EXAMINATION: General:  Obese young male, critically ill  Neuro:  Unresponsive on vent, no respiratory effort above vent  HEENT:  Mm moist, ETT  Cardiovascular:  s1s2 irreg, intermittent wide complex arrhythmia  Lungs:  resps even non labored on vent, coarse throughout, R chest tube x 2 Abdomen:  Round, distended, -bs  Musculoskeletal:  Warm and dry, scant BLE edema   LABS:  BMET  Recent Labs Lab 11/14/15 0506 11/14/15 1847 11/15/15 0444 11/16/15 1110  NA 136  --  134* 141  K  4.5  --  3.7 3.5  CL 106  --  102 103  CO2 23  --  26 18*  BUN 10  --  7 10  CREATININE 0.85 1.03 0.79 1.15  GLUCOSE 107*  --  128* 291*    Electrolytes  Recent Labs Lab 11/13/15 2115 11/14/15 0506 11/14/15 1847 11/15/15 0444 11/16/15 1110  CALCIUM  --  8.3*  --  8.4* 6.6*  MG 2.6* 2.3 2.2  --   --     CBC  Recent Labs Lab 11/14/15 1847 11/15/15 0444 11/16/15 1110  WBC 10.2 11.2* 9.2  HGB 12.8*  11.6* 9.9*  HCT 39.1 35.4* 31.6*  PLT 146* 132* 126*    Coag's  Recent Labs Lab 11/11/15 1037 11/13/15 1450 11/15/15 0444 11/16/15 0230 11/16/15 1110  APTT 27 32  --   --   --   INR 1.11 1.36 1.38 1.26 1.69*    Sepsis Markers No results for input(s): LATICACIDVEN, PROCALCITON, O2SATVEN in the last 168 hours.  ABG  Recent Labs Lab 11/13/15 2118 11/16/15 1050 11/16/15 1114  PHART 7.375 6.933* 7.079*  PCO2ART 42.2 94.0* 57.2*  PO2ART 137.0* 36.0* 75.0*    Liver Enzymes  Recent Labs Lab 11/11/15 1037 11/16/15 1110  AST 16 213*  ALT 13* 77*  ALKPHOS 57 314*  BILITOT 0.8 0.4  ALBUMIN 3.3* 1.5*    Cardiac Enzymes  Recent Labs Lab 11/16/15 1110  TROPONINI 2.95*    Glucose  Recent Labs Lab 11/14/15 0806 11/14/15 1201 11/14/15 1617 11/14/15 1953 11/15/15 0023 11/15/15 0508  GLUCAP 115* 116* 140* 109* 77 104*    Imaging No results found.   STUDIES:    CULTURES:   ANTIBIOTICS: 12/18>>> zosyn   SIGNIFICANT EVENTS: 12/15 minimally invasive mitral valve replacement  12/18 cardiac arrest   LINES/TUBES: ETT 12/18>>>   DISCUSSION: 49yo male with hx OSA, HTN, mitral regurg s/p minimally invasive mitral valve replacement 12/15 with prolonged VFib arrest 12/18.  Now with persistent shock, acidosis.   ASSESSMENT / PLAN:  PULMONARY Acute respiratory failure - post cardiac arrest  Hx OSA  Probable aspiration  P:   Vent support - 8cc/kg  F/u CXR  F/u ABG in 1 hour  Cont RR 26  Wean FiO2 as able  Empiric abx as below   CARDIOVASCULAR Severe mitral regurg - s/p minimally invasive repair  Hx HTN  VFib arrest - >45 mins - unclear etiology.  ?VTE given hx DVT v primary arrhythmia post manipulation of pacer wires.   Hx DVT  P:  CVTS, Cards following  Epi gtt - wean first  Norepi, amiodarone gtt  Stat echo  Not candidate for hypothermia protocol given persistent shock, prolonged downtime  F/u lactate  Trend troponin    RENAL Metabolic acidosis - post prolonged arrest  P:   Stat cmet  HCO3 gtt   GASTROINTESTINAL Shock liver  P:   PPI   HEMATOLOGIC No active issue  P:  Cbc pending  Defer anticoagulation to cards/CVTS   INFECTIOUS ?aspiration PNA  P:   Empiric zosyn as above   ENDOCRINE DM  P:   SSI   NEUROLOGIC AMS - post prolonged cardiac arrest  P:   RASS goal: 0 Not candidate for hypothermia protocol given persistent shock, prolonged downtime  Avoid fever  EEG  CT head once hemodynamically stable    FAMILY  - Updates:  Large family updated at bedside 12/18.  Poor prognosis conveyed.    Nickolas Madrid, NP 11/16/2015 Pager: [phone] or [phone]  Attending Note:  I have examined patient, reviewed labs, studies and notes. I have discussed the case with Shon Millet, and I agree with the data and plans as amended above. Pt experienced acute decompensation today after doing quite well s/p minimally invasive MVR. Course was c/b 3rd degree block necessitating pacing, was likely going to need permanent pacer. He experienced VT/VF arrest in house with immediate CPR. Post resuscitation he was acidotic, in shock requiring epi + norepi. Resuscitation time was prolonged at 45 minutes. On my eval he is unresponsive, hemodynamically improved on pressors, paced. His ABG confirms an improving mixed acidosis. Will decrease RR to 26 to avoid air trapping and hemodynamic effects, follow acidosis. Given his prolonged resuscitation time and the intrathoracic risks that would be associated with possible coagulopathy I do not believe he is a candidate for hypothermia protocol. We will follow. Independent critical care time is 40 minutes.   Baltazar Apo, MD, PhD 11/16/2015, 12:36 PM  Pulmonary and Critical Care [phone] or if no answer [phone]

## 2015-11-16 NOTE — Progress Notes (Signed)
ANTIBIOTIC CONSULT NOTE - INITIAL and Amiodarone Drug - Drug Interaction Consult Note  Pharmacy Consult for vancomycin Indication: aspiration pneumonia  Allergies  Allergen Reactions  . Iohexol Hives and Itching    Patient needs 13 hour prep per Dr. Pascal Lux, after cath he broke out in hives and had itching     Patient Measurements: Weight: 225 lb 9.6 oz (102.331 kg)  Vital Signs: Temp: 97.2 F (36.2 C) (12/18 1234) Temp Source: Oral (12/18 1234) BP: 98/63 mmHg (12/18 0426) Pulse Rate: 79 (12/18 0426) Intake/Output from previous day: 12/17 0701 - 12/18 0700 In: 120 [P.O.:120] Out: 1515 [Urine:1325; Chest Tube:190] Intake/Output from this shift:    Labs:  Recent Labs  11/14/15 1847 11/15/15 0444 11/16/15 1110  WBC 10.2 11.2* 9.2  HGB 12.8* 11.6* 9.9*  PLT 146* 132* 126*  CREATININE 1.03 0.79 1.15   Estimated Creatinine Clearance: 90.1 mL/min (by C-G formula based on Cr of 1.15). No results for input(s): VANCOTROUGH, VANCOPEAK, VANCORANDOM, GENTTROUGH, GENTPEAK, GENTRANDOM, TOBRATROUGH, TOBRAPEAK, TOBRARND, AMIKACINPEAK, AMIKACINTROU, AMIKACIN in the last 72 hours.   Microbiology: Recent Results (from the past 720 hour(s))  Surgical pcr screen     Status: None   Collection Time: 11/11/15 10:36 AM  Result Value Ref Range Status   MRSA, PCR NEGATIVE NEGATIVE Final   Staphylococcus aureus NEGATIVE NEGATIVE Final    Comment:        The Xpert SA Assay (FDA approved for NASAL specimens in patients over 73 years of age), is one component of a comprehensive surveillance program.  Test performance has been validated by [facility] for patients greater than or equal to 80 year old. It is not intended to diagnose infection nor to guide or monitor treatment.     Medical History: Past Medical History  Diagnosis Date  . 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  Complicated by chronic left lower extremity edema and occasional recurrent  left lower extremity cellulitis   . Obstructive sleep apnea 12/14/2006    AHI 19.2/hr, desaturation to 75% on Polysomnography 02/26/2005.  Uses 11 cm H2O nocturnal nasal CPAP with good symptom control.   . Mitral regurgitation 10/06/2012  . Lymphedema early 65's  . DVT (deep venous thrombosis) ([place]) early 80's    left leg  . Incidental lung nodule, less than or equal to 48mm 11/10/2015    3 mm nodule right lung  . Acute diastolic congestive heart failure ([place]) 09/21/2015    takes Lasix daily  . 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 Patient seized and vomited likely aspirating. Pharmacy consulted to begin vancomycin for pneumonia - already receiving Zosyn. Currently afeb and WBC are normal. SCr is normal also.  vanc 12/18>> Zosyn 12/18>>  Goal of Therapy:  Vancomycin trough level 15-20 mcg/ml  Plan:  - Vancomycin 1500 mg IV once then 750 mg IV q12h - Monitor renal function and clinical progress - Trough as clinically indicated  [facility], Pharm.D., BCPS Clinical Pharmacist Pager: [phone] 11/16/2015 3:13 PM    Recommendations: Watch INR closely with amiodarone - may be days before see interaction.  Amiodarone is metabolized by the cytochrome P450 system and therefore has the potential to cause many drug interactions. Amiodarone has an average plasma half-life of 50 days (range  20 to 100 days).   There is potential for drug interactions to occur several weeks or months after stopping treatment and the onset of drug interactions may be slow after initiating amiodarone.   []  Statins: Increased risk of myopathy. Simvastatin- restrict dose to 20mg  daily. Other statins: counsel patients to report any muscle pain or weakness  immediately.  [x]  Anticoagulants: Amiodarone can increase anticoagulant effect. Consider warfarin dose reduction. Patients should be monitored closely and the dose of anticoagulant altered accordingly, remembering that amiodarone levels take several weeks to stabilize.  []  Antiepileptics: Amiodarone can increase plasma concentration of phenytoin, the dose should be reduced. Note that small changes in phenytoin dose can result in large changes in levels. Monitor patient and counsel on signs of toxicity.  []  Beta blockers: increased risk of bradycardia, AV block and myocardial depression. Sotalol - avoid concomitant use.  []   Calcium channel blockers (diltiazem and verapamil): increased risk of bradycardia, AV block and myocardial depression.  []   Cyclosporine: Amiodarone increases levels of cyclosporine. Reduced dose of cyclosporine is recommended.  []  Digoxin dose should be halved when amiodarone is started.  []  Diuretics: increased risk of cardiotoxicity if hypokalemia occurs.  []  Oral hypoglycemic agents (glyburide, glipizide, glimepiride): increased risk of hypoglycemia. Patient's glucose levels should be monitored closely when initiating amiodarone therapy.   []  Drugs that prolong the QT interval:  Torsades de pointes risk may be increased with concurrent use - avoid if possible.  Monitor QTc, also keep magnesium/potassium WNL if concurrent therapy can't be avoided. Marland Kitchen Antibiotics: e.g. fluoroquinolones, erythromycin. . Antiarrhythmics: e.g. quinidine, procainamide, disopyramide, sotalol. . Antipsychotics: e.g. phenothiazines, haloperidol.  . Lithium, tricyclic antidepressants, and methadone.

## 2015-11-16 NOTE — Progress Notes (Addendum)
   ADVANCED HF ROUNDING NOTE   Patient now wide awake on vent following commands. He denies CP. Now AV paced.   SBP 120 on EPI 10. Oxygenation improving. Lactate down.   I reviewed echo and EF 25-30% with inferior, apical and inferolateral AK/DK with RV pacing. No effusion. MVR looks ok.   I suspect inciting event may have been coronary thrombosis off MV but hard to know. Now 5 hours out from initial event with no CP and unpaced rhythm without ST elevation.  Discussed with Dr. Roxan Hockey at bedside. Will start low-dose IV heparin. No current role for acute cardiac cath given timing of event and lack of CP or other indicators of ongoing ischemia.   Family updated. D/w CCM as well. On broad spectrum abx for aspiration. Will needs lines removed/excahnged in am given that they were placed under emergent conditions.   The patient is critically ill with multiple organ systems failure and requires high complexity decision making for assessment and support, frequent evaluation and titration of therapies, application of advanced monitoring technologies and extensive interpretation of multiple databases.   Critical Care Time devoted to patient care services described in this note another 35 Minutes. In addition to previous time.   Kasyn Rolph,MD 2:49 PM

## 2015-11-16 NOTE — Progress Notes (Signed)
      [address]       [place] [address]             [phone]       Intubated, neuro intact when awakened  BP 105/58 mmHg  Pulse 80  Temp(Src) 98 F (36.7 C) (Oral)  Resp 26  Wt 225 lb 9.6 oz (102.331 kg)  SpO2 99%   Intake/Output Summary (Last 24 hours) at 11/16/15 1725 Last data filed at 11/16/15 1700  Gross per 24 hour  Intake   1250 ml  Output    715 ml  Net    535 ml    He is off epi and down to 2 mcg/kg/min norepi  UO 15- 20 ml/hr  He is likely intravascularly dry. Net negative since admission. Will give 500 ml of saline over 2 hours and then start 1/2 NS.  Will start dopamine at 3 mcg/kg/min for some inotropic support.  Revonda Standard Roxan Hockey, MD [facility] [phone]

## 2015-11-16 NOTE — Progress Notes (Signed)
reponded to code.  Arrived and took over ambu ventilation.  Intubation tried by charged therapist but was unable to intubate.  Anesthesia arrived and was able to place the tube with the scope.  Transported to ICU and was placed on vent

## 2015-11-16 NOTE — Consult Note (Signed)
Code blue initiated.  Chest compressions in progress.  RT attempted intubation with poor view of cords.  DL x 1 by CRNA Leonides Sake, pt vomiting and seizing, biting.  Agonal respirations noted.  Grade IV view noted.  DL with glidescope x 1 by CRNA with good view of cords but ETT would not pass.  Chest compressions and shocks still in progress.  Called Dr Julious Payer for assistance.  Succ 100 mg pushed. Dr Julious Payer intubated with glidescope.  + ETCO2 noted, + chest rise.  ETT secured.

## 2015-11-16 NOTE — Code Documentation (Signed)
  Patient Name: Oscar Brown   MRN: [id number]   Date of Birth/ Sex: 1966/05/29 , male      Admission Date: 11/13/2015  Attending Provider: [name], MD  Primary Diagnosis: S/P mitral valve replacement with metallic valve   Indication: Pt was in his usual state of health until this AM, when he was noted to be unresponsive. Code blue was subsequently called. At the time of arrival on scene, ACLS protocol was underway.   Technical Description:  - CPR performance duration:  45  minutes  - Was defibrillation or cardioversion used? Yes   - Was external pacer placed? Already placed prior to code  - Was patient intubated pre/post CPR? Yes   Medications Administered: Y = Yes; Blank = No Amiodarone  X  Atropine    Calcium    Epinephrine  X  Lidocaine  X  Magnesium  X  Norepinephrine  X  Phenylephrine    Sodium bicarbonate  X  Vasopressin     Post CPR evaluation:  - Final Status - Was patient successfully resuscitated ? Yes - What is current rhythm? Paced rhythm - What is current hemodynamic status? Unstable  Miscellaneous Information:  - Labs sent, including: ABG  - Primary team notified?  Yes  - Family Notified? Yes  - Additional notes/ transfer status: Transferred to [place], MD  11/16/2015, 11:04 AM

## 2015-11-16 NOTE — Progress Notes (Signed)
Call received at 0953 from [place] on blue phone saying pt was in v-fib. RN went to check pt immediately. Upon entering room, pt noted to be agonally breathing and unresponsive with pulse intially. Help called out immediately. Code called. Crash cart brought in. See Code Documentation.

## 2015-11-16 NOTE — Progress Notes (Addendum)
      [address]       [place] [address]             [phone]      I was on [place] in another patient's room. Heard alarm. When I arrived Dr. Haroldine Laws and code team were present and CPR underway. Arrest was witnessed and CPR initiated immediately  49 y/o s/p minimally invasive MVR. Was apparently talking on phone this morning and developed cardiac arrest with VF. Immediate code blue called by nurse. Immediate CPR begun. He was in VT/VF. He did have torsades morphology. Failed to convert with multiple shocks. Required multiple doses of epi, bicarb, amiodarone and lidocaine. He was bagged masked, then had large amount of emesis when attempting to intubate. Finally was intubated by anesthesiologist. He also had a grand mal seizure during the resuscitation treated with ativan.  He had a couple of episodes of transient ROSC but it was 40-45 minutes before he maintained it long enough to transfer to ICU. Transported on  Epi and norepi drips.  Review of telemetry showed a paced rhythm with spontaneous VF.   Family is present and understands the prognosis is poor.  Started on zosyn empirically for aspiration.  Femoral arterial line placed for BP monitoring and blood draws.

## 2015-11-16 NOTE — Progress Notes (Signed)
  Echocardiogram 2D Echocardiogram Limited  has been performed.  Oscar Brown M 11/16/2015, 1:02 PM

## 2015-11-16 NOTE — Progress Notes (Signed)
      [address]       [place] [address]             [phone]      Intubated, sedated  Per RN he did open eyes spontaneously and respond appropriately before being re-sedated  BP 98/63 mmHg  Pulse 79  Temp(Src) 97.2 F (36.2 C) (Oral)  Resp 18  Wt 225 lb 9.6 oz (102.331 kg)  SpO2 99%   Intake/Output Summary (Last 24 hours) at 11/16/15 1326 Last data filed at 11/16/15 0700  Gross per 24 hour  Intake    120 ml  Output    715 ml  Net   -595 ml    Currently BP 123456 systolic on epi at 10 mcg/min and norepi 30 mcg/min  Pacing DOO without issues  FiO2 down to 80  Remains critically ill but has stabilized considerably  Remo Lipps C. Roxan Hockey, MD [facility] [phone]

## 2015-11-16 NOTE — Code Documentation (Signed)
  Responded to Code Blue.  49 y/o s/p minimally invasive MVR. Was apparently talking on phone this morning and developed cardiac arrest with VF. Immediate code blue [place] by nurse. Immediate CPR begun.   Received multiple shocks with persistent VT/VF. Treated with numerous rounds of epi, amiodarone, lidocaine, magnesium and bicarb. Persistent VT/VF with ongoing CPR.  Dr. Roxan Hockey (T[facility]) and I supervised the entire resuscitation. Review of tele shows sinus rhythm with spontaneous VF.   Continuous bag mask ventilation by RT and CRNA. Began to have seizures and emesis. Eventually paralyzed and intubated by anesthesia attending. Finally developed [place] after nearly 40 minutes of CPR. Started on epi and amio drips. Echo done ae bedside showed well seated MV. Very poor LV function. No significant pericardial effusion.   I placed emergent right femoral central line for IV access. Transferred to ICU in critical condition after nearly an hour of resuscitation.  I spoke with family and conveyed that he likely has very poor prognosis and with high-risk of anoxic brain injury if he does survive.   Total CCT > 1 hour not including time for central line placement.    Bensimhon, Daniel,MD 11:07 AM

## 2015-11-17 ENCOUNTER — Encounter (HOSPITAL_COMMUNITY): Payer: Self-pay | Admitting: *Deleted

## 2015-11-17 ENCOUNTER — Inpatient Hospital Stay (HOSPITAL_COMMUNITY): Payer: Managed Care, Other (non HMO)

## 2015-11-17 DIAGNOSIS — I2119 ST elevation (STEMI) myocardial infarction involving other coronary artery of inferior wall: Secondary | ICD-10-CM

## 2015-11-17 LAB — CBC
HEMATOCRIT: 35.6 % — AB (ref 39.0–52.0)
HEMOGLOBIN: 12.1 g/dL — AB (ref 13.0–17.0)
MCH: 29.4 pg (ref 26.0–34.0)
MCHC: 34 g/dL (ref 30.0–36.0)
MCV: 86.6 fL (ref 78.0–100.0)
Platelets: 141 10*3/uL — ABNORMAL LOW (ref 150–400)
RBC: 4.11 MIL/uL — AB (ref 4.22–5.81)
RDW: 12.8 % (ref 11.5–15.5)
WBC: 15.2 10*3/uL — AB (ref 4.0–10.5)

## 2015-11-17 LAB — POCT I-STAT 3, ART BLOOD GAS (G3+)
ACID-BASE EXCESS: 10 mmol/L — AB (ref 0.0–2.0)
Acid-Base Excess: 9 mmol/L — ABNORMAL HIGH (ref 0.0–2.0)
Acid-Base Excess: 8 mmol/L — ABNORMAL HIGH (ref 0.0–2.0)
BICARBONATE: 30.7 meq/L — AB (ref 20.0–24.0)
BICARBONATE: 30.5 meq/L — AB (ref 20.0–24.0)
Bicarbonate: 33 mEq/L — ABNORMAL HIGH (ref 20.0–24.0)
O2 SAT: 99 %
O2 SAT: 98 %
O2 Saturation: 98 %
PCO2 ART: 30.5 mmHg — AB (ref 35.0–45.0)
PCO2 ART: 34.3 mmHg — AB (ref 35.0–45.0)
PH ART: 7.555 — AB (ref 7.350–7.450)
PO2 ART: 95 mmHg (ref 80.0–100.0)
PO2 ART: 87 mmHg (ref 80.0–100.0)
Patient temperature: 97.6
Patient temperature: 97.8
TCO2: 32 mmol/L (ref 0–100)
TCO2: 32 mmol/L (ref 0–100)
TCO2: 34 mmol/L (ref 0–100)
pCO2 arterial: 33.6 mmHg — ABNORMAL LOW (ref 35.0–45.0)
pH, Arterial: 7.608 (ref 7.350–7.450)
pH, Arterial: 7.596 — ABNORMAL HIGH (ref 7.350–7.450)
pO2, Arterial: 87 mmHg (ref 80.0–100.0)

## 2015-11-17 LAB — BASIC METABOLIC PANEL
ANION GAP: 10 (ref 5–15)
BUN: 26 mg/dL — AB (ref 6–20)
CHLORIDE: 99 mmol/L — AB (ref 101–111)
CO2: 28 mmol/L (ref 22–32)
Calcium: 7.3 mg/dL — ABNORMAL LOW (ref 8.9–10.3)
Creatinine, Ser: 2.46 mg/dL — ABNORMAL HIGH (ref 0.61–1.24)
GFR calc Af Amer: 34 mL/min — ABNORMAL LOW (ref >60)
GFR, EST NON AFRICAN AMERICAN: 29 mL/min — AB (ref >60)
GLUCOSE: 98 mg/dL (ref 65–99)
POTASSIUM: 4.2 mmol/L (ref 3.5–5.1)
Sodium: 137 mmol/L (ref 135–145)

## 2015-11-17 LAB — GLUCOSE, CAPILLARY
GLUCOSE-CAPILLARY: 141 mg/dL — AB (ref 65–99)
GLUCOSE-CAPILLARY: 54 mg/dL — AB (ref 65–99)
GLUCOSE-CAPILLARY: 75 mg/dL (ref 65–99)
GLUCOSE-CAPILLARY: 80 mg/dL (ref 65–99)
GLUCOSE-CAPILLARY: 83 mg/dL (ref 65–99)
Glucose-Capillary: 79 mg/dL (ref 65–99)
Glucose-Capillary: 56 mg/dL — ABNORMAL LOW (ref 65–99)
Glucose-Capillary: 62 mg/dL — ABNORMAL LOW (ref 65–99)
Glucose-Capillary: 63 mg/dL — ABNORMAL LOW (ref 65–99)
Glucose-Capillary: 86 mg/dL (ref 65–99)

## 2015-11-17 LAB — MAGNESIUM: Magnesium: 2.3 mg/dL (ref 1.7–2.4)

## 2015-11-17 LAB — PROTIME-INR
INR: 1.66 — ABNORMAL HIGH (ref 0.00–1.49)
Prothrombin Time: 19.6 seconds — ABNORMAL HIGH (ref 11.6–15.2)

## 2015-11-17 LAB — PHOSPHORUS: PHOSPHORUS: 2.1 mg/dL — AB (ref 2.5–4.6)

## 2015-11-17 LAB — HEPATIC FUNCTION PANEL
ALT: 328 U/L — AB (ref 17–63)
AST: 762 U/L — AB (ref 15–41)
Albumin: 2 g/dL — ABNORMAL LOW (ref 3.5–5.0)
Alkaline Phosphatase: 399 U/L — ABNORMAL HIGH (ref 38–126)
BILIRUBIN DIRECT: 0.3 mg/dL (ref 0.1–0.5)
BILIRUBIN INDIRECT: 0.4 mg/dL (ref 0.3–0.9)
BILIRUBIN TOTAL: 0.7 mg/dL (ref 0.3–1.2)
Total Protein: 3.5 g/dL — ABNORMAL LOW (ref 6.5–8.1)

## 2015-11-17 LAB — LACTIC ACID, PLASMA: Lactic Acid, Venous: 1.1 mmol/L (ref 0.5–2.0)

## 2015-11-17 LAB — HEPARIN LEVEL (UNFRACTIONATED)
HEPARIN UNFRACTIONATED: 0.11 [IU]/mL — AB (ref 0.30–0.70)
Heparin Unfractionated: 0.15 IU/mL — ABNORMAL LOW (ref 0.30–0.70)

## 2015-11-17 MED ORDER — CETYLPYRIDINIUM CHLORIDE 0.05 % MT LIQD
7.0000 mL | Freq: Two times a day (BID) | OROMUCOSAL | Status: DC
  Administered 2015-11-17 – 2015-11-19 (×6): 7 mL via OROMUCOSAL

## 2015-11-17 MED ORDER — GLUCOSE-VITAMIN C 4-6 GM-MG PO CHEW
CHEWABLE_TABLET | ORAL | Status: AC
  Administered 2015-11-17: 16 g
  Filled 2015-11-17: qty 1

## 2015-11-17 MED ORDER — SODIUM CHLORIDE 0.9 % IJ SOLN
10.0000 mL | Freq: Two times a day (BID) | INTRAMUSCULAR | Status: DC
  Administered 2015-11-17 – 2015-11-22 (×6): 10 mL
  Administered 2015-11-24: 20 mL

## 2015-11-17 MED ORDER — TRAMADOL HCL 50 MG PO TABS
50.0000 mg | ORAL_TABLET | Freq: Four times a day (QID) | ORAL | Status: DC | PRN
  Administered 2015-11-17 – 2015-11-18 (×2): 100 mg via ORAL
  Administered 2015-11-23: 50 mg via ORAL
  Filled 2015-11-17 (×3): qty 2

## 2015-11-17 MED ORDER — FENTANYL CITRATE (PF) 100 MCG/2ML IJ SOLN
100.0000 ug | Freq: Once | INTRAMUSCULAR | Status: AC
  Administered 2015-11-17: 100 ug via INTRAVENOUS
  Filled 2015-11-17: qty 2

## 2015-11-17 MED ORDER — CETYLPYRIDINIUM CHLORIDE 0.05 % MT LIQD: 7.0000 mL | Freq: Two times a day (BID) | OROMUCOSAL | Status: DC

## 2015-11-17 MED ORDER — FUROSEMIDE 10 MG/ML IJ SOLN
40.0000 mg | Freq: Three times a day (TID) | INTRAMUSCULAR | Status: DC
  Administered 2015-11-17 – 2015-11-18 (×4): 40 mg via INTRAVENOUS
  Filled 2015-11-17 (×4): qty 4

## 2015-11-17 MED ORDER — SODIUM CHLORIDE 0.9 % IJ SOLN: 10.0000 mL | INTRAMUSCULAR | Status: DC | PRN

## 2015-11-17 MED ORDER — VANCOMYCIN HCL 10 G IV SOLR: 1250.0000 mg | INTRAVENOUS | Status: DC

## 2015-11-17 MED ORDER — OXYCODONE HCL 5 MG PO TABS
5.0000 mg | ORAL_TABLET | ORAL | Status: DC | PRN
  Administered 2015-11-17 – 2015-11-19 (×6): 10 mg via ORAL
  Administered 2015-11-19: 5 mg via ORAL
  Administered 2015-11-20 – 2015-11-24 (×9): 10 mg via ORAL
  Filled 2015-11-17: qty 1
  Filled 2015-11-17 (×15): qty 2

## 2015-11-17 MED ORDER — MIDAZOLAM HCL 2 MG/2ML IJ SOLN
INTRAMUSCULAR | Status: AC
  Administered 2015-11-17: 2 mg
  Filled 2015-11-17: qty 2

## 2015-11-17 MED ORDER — FENTANYL CITRATE (PF) 100 MCG/2ML IJ SOLN
25.0000 ug | INTRAMUSCULAR | Status: DC | PRN
  Administered 2015-11-17 – 2015-11-18 (×2): 50 ug via INTRAVENOUS
  Filled 2015-11-17 (×2): qty 2

## 2015-11-17 MED ORDER — VANCOMYCIN HCL 500 MG IV SOLR
500.0000 mg | Freq: Once | INTRAVENOUS | Status: DC
  Filled 2015-11-17: qty 500

## 2015-11-17 MED ORDER — PANTOPRAZOLE SODIUM 40 MG IV SOLR
40.0000 mg | Freq: Once | INTRAVENOUS | Status: AC
  Administered 2015-11-17: 40 mg via INTRAVENOUS
  Filled 2015-11-17: qty 40

## 2015-11-17 MED ORDER — MIDAZOLAM HCL 2 MG/2ML IJ SOLN
INTRAMUSCULAR | Status: AC
  Filled 2015-11-17: qty 2

## 2015-11-17 MED ORDER — CHLORHEXIDINE GLUCONATE 0.12 % MT SOLN: 15.0000 mL | Freq: Two times a day (BID) | OROMUCOSAL | Status: DC

## 2015-11-17 MED ORDER — MIDAZOLAM HCL 2 MG/2ML IJ SOLN
2.0000 mg | Freq: Once | INTRAMUSCULAR | Status: AC
  Administered 2015-11-17: 1 mg via INTRAVENOUS
  Filled 2015-11-17: qty 2

## 2015-11-17 MED ORDER — PANTOPRAZOLE SODIUM 40 MG PO TBEC
40.0000 mg | DELAYED_RELEASE_TABLET | Freq: Every day | ORAL | Status: DC
  Administered 2015-11-18 – 2015-11-24 (×7): 40 mg via ORAL
  Filled 2015-11-17 (×7): qty 1

## 2015-11-17 NOTE — Progress Notes (Addendum)
[facility] Indication: MVR  Allergies  Allergen Reactions  . Iohexol Hives and Itching    Patient needs 13 hour prep per Dr. Pascal Lux, after cath he broke out in hives and had itching     Patient Measurements: Weight: 245 lb (111.131 kg) Heparin Dosing Weight: 90.5 kg  Vital Signs: Temp: 96.8 F (36 C) (12/19 0736) Temp Source: Axillary (12/19 0736) BP: 98/74 mmHg (12/19 0600) Pulse Rate: 80 (12/19 0600)  Labs:  Recent Labs  11/15/15 0444 11/16/15 0230 11/16/15 1110 11/16/15 1500 11/16/15 2125 11/16/15 2238 11/17/15 0443  HGB 11.6*  --  9.9*  --   --   --  12.1*  HCT 35.4*  --  31.6*  --   --   --  35.6*  PLT 132*  --  126*  --   --   --  141*  LABPROT 17.1* 15.9* 19.8*  --   --   --  19.6*  INR 1.38 1.26 1.69*  --   --   --  1.66*  HEPARINUNFRC  --   --   --   --  <0.10*  --   --   CREATININE 0.79  --  1.15  --   --  2.08* 2.46*  CKTOTAL  --   --  158  --   --   --   --   CKMB  --   --  7.0*  --   --   --   --   TROPONINI  --   --  2.95* 10.64*  --  >65.00*  --     Estimated Creatinine Clearance: 43.9 mL/min (by C-G formula based on Cr of 2.46).   Medical History: Past Medical History  Diagnosis Date  . 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  Complicated by chronic left lower extremity edema and occasional recurrent left lower extremity cellulitis   . Obstructive sleep apnea 12/14/2006    AHI 19.2/hr, desaturation to 75% on Polysomnography 02/26/2005.  Uses 11 cm H2O nocturnal nasal CPAP with good symptom control.   . Mitral regurgitation 10/06/2012  . Lymphedema early 50's  . DVT (deep venous thrombosis) ([place]) early 80's    left leg  . Incidental lung nodule, less than or equal to 92mm 11/10/2015    3 mm nodule right lung  . Acute diastolic congestive heart failure ([place]) 09/21/2015    takes Lasix daily  . S/P minimally invasive mitral valve replacement with metallic valve  [id number]    33 mm Carbomedics Optiform bileaflet mechanical prosthesis placed via right mini-thoracotomy approach    Medications:  Prescriptions prior to admission  Medication Sig Dispense Refill Last Dose  . amiodarone (PACERONE) 200 MG tablet Take 1 tablet (200 mg total) by mouth 2 (two) times daily. 60 tablet 0 11/13/2015 at Unknown time  . furosemide (LASIX) 20 MG tablet Take 1 tablet (20 mg total) by mouth daily. 30 tablet 3 11/12/2015 at Unknown time   Scheduled:  . acetaminophen  1,000 mg Oral 4 times per day  . antiseptic oral rinse  7 mL Mouth Rinse QID  . aspirin EC  325 mg Oral Daily  . bisacodyl  10 mg Oral Daily   Or  . bisacodyl  10 mg Rectal Daily  . chlorhexidine gluconate  15 mL Mouth Rinse BID  . docusate sodium  200 mg Oral Daily  . furosemide  40 mg Intravenous 3 times per  day  . insulin aspart  0-20 Units Subcutaneous 6 times per day  . pantoprazole  40 mg Oral Daily  . piperacillin-tazobactam (ZOSYN)  IV  3.375 g Intravenous Q8H  . potassium chloride  20 mEq Oral Daily  . sodium chloride  3 mL Intravenous Q12H  . warfarin  5 mg Oral q1800  . Warfarin - Physician Dosing Inpatient   Does not apply q1800   Infusions:  . sodium chloride 100 mL/hr at 11/17/15 0600  . sodium chloride 250 mL (11/17/15 0600)  . amiodarone 30 mg/hr (11/17/15 0600)  . DOPamine 3 mcg/kg/min (11/17/15 0600)  . heparin 1,000 Units/hr (11/17/15 0600)    Assessment: 49yo male with history of HTN, OSA, DVT and here for minimally invasive MVR. Pharmacy is consulted to dose heparin for MVR, dosed conservatively per Dr. Roxan Hockey initially and with a lower goal. Also on warfarin per MD - 5mg  daily. HL subtherapeutic (0.11) on 1000 units/h. INR stable 1.66. Hg 12.1 stable, plt low, up to 141. No bleed/IV line issues per RN.  Goal of Therapy:  Heparin level 0.3-0.35 units/ml Monitor platelets by anticoagulation protocol: Yes    Plan:  Heparin to 1150 units/h (goal 0.3-0.35 per  Roxan Hockey) Warfarin 5mg  daily per MD 6h HL, Daily HL/INR/CBC, mon s/sx bleeding  Elicia Lamp, PharmD, [facility] Clinical Pharmacist Pager [phone] 11/17/2015 8:53 AM

## 2015-11-17 NOTE — Consult Note (Signed)
PULMONARY / CRITICAL CARE MEDICINE   Name: Oscar Brown MRN: [id number] DOB: 21-Sep-1966    ADMISSION DATE:  11/13/2015 CONSULTATION DATE:  12/18  REFERRING MD:  Roxan Hockey   CHIEF COMPLAINT:  Post arrest   BRIEF  49yo male with hx HTN, OSA, remote hx DVT, mitral regurg admitted 12/15 for minimally invasive mitral valve replacement.  Post op course c/b difficulty with pacer wires and complete heart block, resolved with manipulation of wires and d/c of amiodarone and B blockers.  He was doing well on med-surg, ambulating and nearing ready for d/c when he had sudden cardiac arrest.  Initial rhythm VFib with prolonged downtime >45 minutes.  Now in SICU with persistent severe metabolic acidosis, persistent shock and ongoing arrhythmias.  Bedside echo with dismal EF per cards.   PCCM consulted to assist with vent management.    ANTIBIOTICS: 12/18>>> zosyn   SIGNIFICANT EVENTS: 12/15 minimally invasive mitral valve replacement  12/18 cardiac arrest  ETT 12/18>>>    SUBJECTIVE:  11/17/2015 : doing well on PRVC. Normal mental status. On low dose dopamine gtt. Meets SBT criteria. Family and DR Roxy Manns at bedside. Dr Kayleen Memos. Permanent pace maker and at some point cardiac cath. Echo ef now 25%. Rising creat. careds plans TEE   VITAL SIGNS: BP 98/74 mmHg  Pulse 80  Temp(Src) 96.8 F (36 C) (Axillary)  Resp 24  Wt 245 lb (111.131 kg)  SpO2 97%  HEMODYNAMICS:    VENTILATOR SETTINGS: Vent Mode:  [-] PRVC FiO2 (%):  [40 %-100 %] 40 % Set Rate:  [20 bmp-28 bmp] 20 bmp Vt Set:  [550 mL] 550 mL PEEP:  [5 cmH20] 5 cmH20 Plateau Pressure:  [18 cmH20-27 cmH20] 18 cmH20  INTAKE / OUTPUT: I/O last 3 completed shifts: In: 5269.5 [I.V.:4239.5; NG/GT:30; IV Piggyback:1000] Out: 1500 [Urine:910; Emesis/NG output:100; Chest Tube:490]  PHYSICAL EXAMINATION: General:  Obese young male Neuro: CAM-ICU neg for delirium. Moves all 4s. Seems to have intact cognition. Calm. RASS +1. Denies  chest pain  HEENT:  Mm moist, ETT  Cardiovascular:  s1s2 irreg, intermittent wide complex arrhythmia  Lungs:  resps even non labored on vent, coarse throughout, R chest tube x 2. 40% fio2 on vent PRVC Abdomen:  Round, distended, -bs  Musculoskeletal:  Warm and dry, scant BLE edema   LABS:   PULMONARY  Recent Labs Lab 11/16/15 1200 11/16/15 1333 11/17/15 0423 11/17/15 0656 11/17/15 0814  PHART 7.355 7.409 7.608* 7.555* 7.596*  PCO2ART 36.5 34.5* 30.5* 34.3* 33.6*  PO2ART 154.0* 100.0 95.0 87.0 87.0  HCO3 20.6 22.0 30.7* 30.5* 33.0*  TCO2 22 23 32 32 34  O2SAT 99.0 98.0 99.0 98.0 98.0    CBC  Recent Labs Lab 11/15/15 0444 11/16/15 1110 11/17/15 0443  HGB 11.6* 9.9* 12.1*  HCT 35.4* 31.6* 35.6*  WBC 11.2* 9.2 15.2*  PLT 132* 126* 141*    COAGULATION  Recent Labs Lab 11/13/15 1450 11/15/15 0444 11/16/15 0230 11/16/15 1110 11/17/15 0443  INR 1.36 1.38 1.26 1.69* 1.66*    CARDIAC   Recent Labs Lab 11/16/15 1110 11/16/15 1500 11/16/15 2238  TROPONINI 2.95* 10.64* >65.00*   No results for input(s): PROBNP in the last 168 hours.   CHEMISTRY  Recent Labs Lab 11/13/15 2115  11/14/15 0506 11/14/15 1847 11/15/15 0444 11/16/15 1110 11/16/15 2238 11/17/15 0443  NA  --   < > 136  --  134* 141 138 137  K  --   < > 4.5  --  3.7 3.5  3.8 4.2  CL  --   < > 106  --  102 103 102 99*  CO2  --   --  23  --  26 18* 27 28  GLUCOSE  --   < > 107*  --  128* 291* 120* 98  BUN  --   < > 10  --  7 10 21* 26*  CREATININE 0.96  < > 0.85 1.03 0.79 1.15 2.08* 2.46*  CALCIUM  --   --  8.3*  --  8.4* 6.6* 7.2* 7.3*  MG 2.6*  --  2.3 2.2  --   --   --   --   < > = values in this interval not displayed. Estimated Creatinine Clearance: 43.9 mL/min (by C-G formula based on Cr of 2.46).   LIVER  Recent Labs Lab 11/11/15 1037 11/13/15 1450 11/15/15 0444 11/16/15 0230 11/16/15 1110 11/16/15 2238 11/17/15 0443  AST 16  --   --   --  213* 864*  --   ALT 13*  --    --   --  77* 323*  --   ALKPHOS 57  --   --   --  314* 443*  --   BILITOT 0.8  --   --   --  0.4 0.6  --   PROT 6.3*  --   --   --  3.3* 4.5*  --   ALBUMIN 3.3*  --   --   --  1.5* 1.9*  --   INR 1.11 1.36 1.38 1.26 1.69*  --  1.66*     INFECTIOUS  Recent Labs Lab 11/16/15 1123 11/16/15 1340 11/16/15 2223  LATICACIDVEN 15.1* 8.9* 2.3*     ENDOCRINE CBG (last 3)   Recent Labs  11/16/15 2335 11/17/15 0338 11/17/15 0734  GLUCAP 80 83 79         IMAGING x48h  - image(s) personally visualized  -   highlighted in bold Dg Chest Port 1 View  11/17/2015  CLINICAL DATA:  Status post mitral valve replacement on November 13, 2015 EXAM: PORTABLE CHEST 1 VIEW COMPARISON:  Portable chest x-ray of November 16, 2015 FINDINGS: The lungs are reasonably well inflated. The pulmonary interstitial markings are slightly more conspicuous today. The cardiac silhouette is enlarged. The prosthetic mitral valve ring is again demonstrated. The pulmonary vascularity prominent centrally. There is no pleural effusion. The endotracheal tube tip lies 5 cm above the carina. The esophagogastric tube tip projects below the inferior margin of the image. The 2 right-sided chest tubes are in stable position. IMPRESSION: Slight interval increase in the pulmonary interstitial markings suggests low-grade interstitial edema. Stable enlargement of the cardiac silhouette with stable to slightly increased central pulmonary vascular congestion. The support tubes are in reasonable position. Electronically Signed   By: David  Martinique M.D.   On: 11/17/2015 07:20   Dg Chest Portable 1 View  11/16/2015  CLINICAL DATA:  Endotracheal tube placement. EXAM: PORTABLE CHEST 1 VIEW COMPARISON:  Chest x-ray dated 11/15/2015. FINDINGS: Endotracheal tube has been placed in the interval and appears well positioned with tip approximately 2.5 cm above the level of the carina. Enteric tube passes below the diaphragm but with side holes just  above the level of the gastroesophageal junction. Cardiomegaly is stable. Overall cardiomediastinal silhouette is unchanged in size or configuration. Two right-sided chest tubes appear stable in position. Lungs are clear. IMPRESSION: 1. Nasogastric tube tip is just below the level of the diaphragm, with proximal side  holes just above the level of the gastroesophageal junction. Recommend advancement. 2. Endotracheal tube well positioned with tip just above the level of the carina. 3. Stable position of the right-sided chest tubes. 4. Stable cardiomegaly. 5. Lungs are clear, perhaps mild atelectasis at the right lung base. Electronically Signed   By: Franki Cabot M.D.   On: 11/16/2015 12:51       DISCUSSION: 49yo male with hx OSA, HTN, mitral regurg s/p minimally invasive mitral valve replacement 12/15 with prolonged VFib arrest 12/18.  Now with persistent shock, acidosis. 12/18 - > improving 11/17/15 and possible exdtubation   ASSESSMENT / PLAN:  PULMONARY Acute respiratory failure - post cardiac arrest  Hx OSA on CPAP Probable aspiration   - meets SBT criteria 11/17/2015   P:   PSV and extubate if doing well - lasix cover  BiPAP might be needed for positive pressure to avoid opulm edema post extubatiion  Definite BiPAP qhs and when stable do CPAP QHS for his OSA  CARDIOVASCULAR Severe mitral regurg - s/p minimally invasive repair  Hx HTN  VFib arrest - >45 mins - unclear etiology.  ?VTE given hx DVT v primary arrhythmia post manipulation of pacer wires.   Hx DVT    - now only on low dose dopamine and IV heparin gtt and amio gtt. EF 25%. Dr Roxy Manns thinks dopamine can come off and recs cath/permanent pacer  P:  TEE per cards later 11/17/2015  Start lasix tid 11/17/2015 DC epi and levophed orders Dopamine wean off per cardd UIV heparin per cards Cath and pacer per cards  RENAL Metabolic acidosis - post prolonged arrest    - off bicarb gtt. Lactic acidosis resolving/resolved.  Creat rising - AKI   P:   Check mag and phos Monitor creat esp with lasix Dc vanc  GASROINTESTINAL Shock liver  P:   PPI  Recheck LFT  HEMATOLOGIC No active issue  - on Iv heparin gtt  P:  Cbc pending  Defer anticoagulation to cards/CVTS    INFECTIOUS ?aspiration PNA  P:   Empiric zosyn as above  Dc vanc (esp wuit AKI)  ENDOCRINE DM  P:   SSI   NEUROLOGIC  AMS - post prolonged cardiac arrest    - normal mental status 11/17/2015   P:   RASS goal: 0   FAMILY  - Updates:  Large family updated at bedside 12/19 in presence of RN and Dr Roxy Manns      The patient is critically ill with multiple organ systems failure and requires high complexity decision making for assessment and support, frequent evaluation and titration of therapies, application of advanced monitoring technologies and extensive interpretation of multiple databases.   Critical Care Time devoted to patient care services described in this note is  40  Minutes. This time reflects time of care of this signee Dr Brand Males. This critical care time does not reflect procedure time, or teaching time or supervisory time of PA/NP/Med student/Med Resident etc but could involve care discussion time    Dr. Brand Males, M.D., [facility].C.P Pulmonary and Critical Care Medicine Staff Physician [facility] Pager: [phone], If no answer or between  15:00h - 7:00h: call [phone]  11/17/2015 8:23 AM

## 2015-11-17 NOTE — Progress Notes (Signed)
RN called me - I am at a remote location  S: awake after TEE and meets extubation criteira  P Extubate - needs bipap qhs  Continue lasix  Dr. Brand Males, M.D., [facility].C.P Pulmonary and Critical Care Medicine Staff Physician [facility] Pager: [phone], If no answer or between  15:00h - 7:00h: call [phone]  11/17/2015 1:15 PM

## 2015-11-17 NOTE — Progress Notes (Signed)
Hypoglycemic Event  CBG: 62  Treatment:  4 oz juice  Symptoms:none  Follow-up CBG: Time:1615 CBG Result: 56  Possible Reasons for Event:NPO  Comments/MD notified:CCM notified, order placed for diet    Juanda Chance

## 2015-11-17 NOTE — Progress Notes (Signed)
ANTICOAGULATION CONSULT NOTE - Follow Up Consult  Pharmacy Consult for Heparin Indication: MVR  Allergies  Allergen Reactions  . Iohexol Hives and Itching    Patient needs 13 hour prep per Dr. Pascal Lux, after cath he broke out in hives and had itching     Patient Measurements: Weight: 245 lb (111.131 kg) Heparin Dosing Weight: 90.5 kg  Vital Signs: Temp: 99.3 F (37.4 C) (12/19 1551) Temp Source: Oral (12/19 1551) BP: 98/71 mmHg (12/19 1600) Pulse Rate: 79 (12/19 1530)  Labs:  Recent Labs  11/15/15 0444 11/16/15 0230 11/16/15 1110 11/16/15 1500 11/16/15 2125 11/16/15 2238 11/17/15 0443 11/17/15 0810 11/17/15 1545  HGB 11.6*  --  9.9*  --   --   --  12.1*  --   --   HCT 35.4*  --  31.6*  --   --   --  35.6*  --   --   PLT 132*  --  126*  --   --   --  141*  --   --   LABPROT 17.1* 15.9* 19.8*  --   --   --  19.6*  --   --   INR 1.38 1.26 1.69*  --   --   --  1.66*  --   --   HEPARINUNFRC  --   --   --   --  <0.10*  --   --  0.11* 0.15*  CREATININE 0.79  --  1.15  --   --  2.08* 2.46*  --   --   CKTOTAL  --   --  158  --   --   --   --   --   --   CKMB  --   --  7.0*  --   --   --   --   --   --   TROPONINI  --   --  2.95* 10.64*  --  >65.00*  --   --   --     Estimated Creatinine Clearance: 43.9 mL/min (by C-G formula based on Cr of 2.46).   Medications:  Heparin at 1150 units/hr (11.5 ml/hr)   Assessment: 49yo male with history of HTN, OSA, DVT and here for minimally invasive MVR. Pharmacy is consulted to dose heparin for MVR, dosed conservatively per Dr. Roxan Hockey initially and with a lower goal. MD holding warfarin.  Heparin level this evening remains SUBtherapeutic despite a rate increase earlier today (HL 0.15 << 0.11, goal of 0.3-0.35 per Dr. Roxan Hockey). CBC okay, no active bleeding noted.   Goal of Therapy:  Heparin level 0.3-0.35 units/ml Monitor platelets by anticoagulation protocol: Yes  Plan:  1. Increase Heparin to 1350 units/hr (13.5  ml/hr) 2. Will continue to monitor for any signs/symptoms of bleeding and will follow up with heparin level in 6 hours   Alycia Rossetti, PharmD, BCPS Clinical Pharmacist Pager: [phone] 11/17/2015 5:08 PM

## 2015-11-17 NOTE — Consult Note (Signed)
ELECTROPHYSIOLOGY CONSULT NOTE    Patient ID: Oscar Brown MRN: [id number], DOB/AGE: November 16, 1966 49 y.o.  Admit date: 11/13/2015 Date of Consult: 11/17/2015   Primary Physician: Karren Cobble, MD Primary Cardiologist: Dr. Oval Linsey CTS: Dr. Roxy Manns  Reason for Consultation: post op heart block, VF arrest  HPI: Oscar Brown is a 49 y.o. male with PMHx of OSA, lymphedema, severe MR (Rheumatic), obesity and tobacco abuse was admitted to [facility] 11/13/15 for an elective MVR which was done 11/13/15 with a 33 mm Sorin Carbomedics Mechanical Valve by Dr. Roxy Manns.  He was doing well, noted on POD #2 (11/16/15) to have symptomatic CHB when his pacing cable accidentally came unhooked, his amiodarone and BB were held.  Later the same day he had a prolonged VF arrest, notes reflect immediate code blue called by nurse. Immediate CPR begun. He was in VT/VF. He did have torsades morphology. Failed to convert with multiple shocks. Required multiple doses of epi, bicarb, amiodarone and lidocaine. He was bagged masked, then had large amount of emesis when attempting to intubate. Finally was intubated by anesthesiologist. He also had a grand mal seizure during the resuscitation treated with ativan.  He had a couple of episodes of transient ROSC but it was 40-45 minutes before he maintained it long enough to transfer to ICU. Transported on Epi and norepi drips.  This morning he remains intubated, is on Amiodarone gtt, dopamine gtt at 79mcg, heaprin gtt,   He is noted to have multiple organ system failure, intubated, with ARI, very abnormal/elevated LFTs, new LV dysfunction with WMA on his echo.  He had very elevated troponins, cardiology/CTS notes suspect etiology of the event felt most likely to be embolic from his mechanical MV, with INR at the time 1.67 and was started in heparin gtt. His WBC is elevaated and on Zosyn post arrest/emergent line placement.  Past Medical History  Diagnosis Date  . 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  Complicated by chronic left lower extremity edema and occasional recurrent left lower extremity cellulitis   . Obstructive sleep apnea 12/14/2006    AHI 19.2/hr, desaturation to 75% on Polysomnography 02/26/2005.  Uses 11 cm H2O nocturnal nasal CPAP with good symptom control.   . Mitral regurgitation 10/06/2012  . Lymphedema early 46's  . DVT (deep venous thrombosis) ([place]) early 80's    left leg  . Incidental lung nodule, less than or equal to 45mm 11/10/2015    3 mm nodule right lung  . Acute diastolic congestive heart failure ([place]) 09/21/2015    takes Lasix daily  . S/P minimally invasive mitral valve replacement with metallic valve [id number]    33 mm Carbomedics Optiform bileaflet mechanical prosthesis placed via right mini-thoracotomy approach     Surgical History:  Past Surgical History  Procedure Laterality Date  . Tee without cardioversion N/A 10/16/2015    Procedure: TRANSESOPHAGEAL ECHOCARDIOGRAM (TEE);  Surgeon: Skeet Latch, MD;  Location: [place];  Service: Cardiovascular;  Laterality: N/A;  . Cardiac catheterization N/A 11/03/2015    Procedure: Right/Left Heart Cath and Coronary Angiography;  Surgeon: Leonie Man, MD;  Location: [place] [facility];  Service: Cardiovascular;  Laterality: N/A;  . Multiple extractions with alveoloplasty N/A 11/07/2015    Procedure: Extraction of tooth #'s 1,2,8,9,12,15,16,17, 32 with alveoloplasty and gross debridement of remaining teeth.;  Surgeon: [name], DDS;  Location: [facility];  Service: Oral Surgery;  Laterality: N/A;  . Tee without cardioversion  N/A 11/13/2015    Procedure: TRANSESOPHAGEAL ECHOCARDIOGRAM (TEE);  Surgeon: [name], MD;  Location: [facility];  Service: Open Heart Surgery;  Laterality: N/A;  . Mitral valve replacement Right 11/13/2015    Procedure: MINIMALLY INVASIVE MITRAL VALVE (MV) REPLACEMENT;  Surgeon: [name], MD;  Location:  [place];  Service: Open Heart Surgery;  Laterality: Right;     Prescriptions prior to admission  Medication Sig Dispense Refill Last Dose  . amiodarone (PACERONE) 200 MG tablet Take 1 tablet (200 mg total) by mouth 2 (two) times daily. 60 tablet 0 11/13/2015 at Unknown time  . furosemide (LASIX) 20 MG tablet Take 1 tablet (20 mg total) by mouth daily. 30 tablet 3 11/12/2015 at Unknown time    Inpatient Medications:  . antiseptic oral rinse  7 mL Mouth Rinse QID  . aspirin EC  325 mg Oral Daily  . bisacodyl  10 mg Oral Daily   Or  . bisacodyl  10 mg Rectal Daily  . chlorhexidine gluconate  15 mL Mouth Rinse BID  . furosemide  40 mg Intravenous 3 times per day  . insulin aspart  0-20 Units Subcutaneous 6 times per day  . [START ON 11/18/2015] pantoprazole  40 mg Oral Daily  . piperacillin-tazobactam (ZOSYN)  IV  3.375 g Intravenous Q8H  . sodium chloride  3 mL Intravenous Q12H  . Warfarin - Physician Dosing Inpatient   Does not apply q1800    Allergies:  Allergies  Allergen Reactions  . Iohexol Hives and Itching    Patient needs 13 hour prep per Dr. Pascal Lux, after cath he broke out in hives and had itching     Social History   Social History  . Marital Status: Legally Separated    Spouse Name: N/A  . Number of Children: 3  . Years of Education: N/A   Occupational History  . Not on file.   Social History Main Topics  . Smoking status: Former Smoker -- 0.10 packs/day for 9 years    Types: Cigarettes    Quit date: 09/05/2012  . Smokeless tobacco: Never Used  . Alcohol Use: No  . Drug Use: No  . Sexual Activity: Yes   Other Topics Concern  . Not on file   Social History Narrative     No family history on file.   Review of Systems: Not obtained, patient is intubated  Physical Exam: Filed Vitals:   11/17/15 0500 11/17/15 0600 11/17/15 0736 11/17/15 0909  BP: 110/84 98/74  119/70  Pulse: 80 80  80  Temp:   96.8 F (36 C)   TempSrc:   Axillary   Resp: 11 24  26    Weight: 245 lb (111.131 kg)     SpO2: 98% 97%  98%   Alert and oriented intubated and non responsive HENT- normal Eyes- EOMI, without scleral icterus Skin- warm and dry; without rashes LN-neg Neck- supple without thyromegaly, JVP-flat, carotids brisk and full without bruits Back not examined Lungs-clear to auscultation laterally CV-Regular rate and rhythm, mechanical S1 and S2, 2/6  murmurs gallops or rubs, S4-absent Abd-soft with active bowel sounds; no midline pulsation or hepatomegaly Pulses-intact femoral and distal MKS-without gross deformity Neuro- sedated  semipurposeful movements  Affect N/A g   Labs:    Lab Results  Component Value Date   WBC 15.2* 11/17/2015   HGB 12.1* 11/17/2015   HCT 35.6* 11/17/2015   MCV 86.6 11/17/2015   PLT 141* 11/17/2015    Recent Labs Lab  11/16/15 2238 11/17/15 0443  NA 138 137  K 3.8 4.2  CL 102 99*  CO2 27 28  BUN 21* 26*  CREATININE 2.08* 2.46*  CALCIUM 7.2* 7.3*  PROT 4.5*  --   BILITOT 0.6  --   ALKPHOS 443*  --   ALT 323*  --   AST 864*  --   GLUCOSE 120* 98   11/16/15 Echocardiogram Impressions: - When compared to the prior study from 10/16/2015 (that showed normal biventricular function), LVEF is now severely decreased with LVEF 25-30% with dyskinesis of the inferior wall and severe paradoxical septal motion - it is dificult to distinguish if this is sec to infarct of sec to pacing. RV is now severely dilated with severe systolic dysfunction. These is suggestive of an acute infarct in the RCA territory. Mechanical mitral valve is poorly visualized, leaflets appear to open, transmitral gradients are normal. There is only trivial MR.  11/03/15 LHC Conclusion    1. Angiographically minimal CAD with most significant being 45% distal circumflex-OM 3 stenosis 2. The left ventricular systolic function is normal -hand injection left ventricular angiography did not allow for significant evaluation of  MR. 3. Normal right heart cath pressures but no pulmonary hypertension. No large V wave noted on the PCWP tracing. 4. TEE documentation of Severe MR   The patient does not have any significant CAD that would require bypass grafting. PA pressure stable.   09/22/15: Echocardiogram Study Conclusions - Left ventricle: The cavity size was normal. Systolic function was normal. The estimated ejection fraction was in the range of 55% to 60%. Wall motion was normal; there were no regional wall motion abnormalities. - Aortic valve: Moderate diffuse thickening and calcification, consistent with sclerosis. - Mitral valve: Mild diffuse calcification of the anterior leaflet. Visually there appears to be moderate to severe regurgitation directed eccentrically and posteriorly on the apical 4 chamber and 2 chamber views. The MR is calculated as mild to mdoerate using the ERO and MR volume. Consider TEE to assess degree of MR further. - Left atrium: The atrium was severely dilated. - Right ventricle: The cavity size was mildly dilated. Wall thickness was normal. - Pulmonary arteries: PA peak pressure: 35 mm Hg (S). Impressions: - The right ventricular systolic pressure was increased consistent with mild pulmonary hypertension.    Radiology/Studies:  Dg Chest Port 1 View 11/17/2015  CLINICAL DATA:  Status post mitral valve replacement on November 13, 2015 EXAM: PORTABLE CHEST 1 VIEW COMPARISON:  Portable chest x-ray of November 16, 2015 FINDINGS: The lungs are reasonably well inflated. The pulmonary interstitial markings are slightly more conspicuous today. The cardiac silhouette is enlarged. The prosthetic mitral valve ring is again demonstrated. The pulmonary vascularity prominent centrally. There is no pleural effusion. The endotracheal tube tip lies 5 cm above the carina. The esophagogastric tube tip projects below the inferior margin of the image. The 2 right-sided chest  tubes are in stable position. IMPRESSION: Slight interval increase in the pulmonary interstitial markings suggests low-grade interstitial edema. Stable enlargement of the cardiac silhouette with stable to slightly increased central pulmonary vascular congestion. The support tubes are in reasonable position. Electronically Signed   By: David  Martinique M.D.   On: 11/17/2015 07:20     EKG:  11/15/15 AV paced with CHB underlying 11/11/15: SR  TELEMETRY: AV paced, noted to have multiple PVC's prior to his arrest  Assessment and Plan:   1. Severe MR      S/p MVR (mechanical)  POD # 3  2. Post-op CHB     Continues pacing via epicardial wires  3. VF arrest 11/16/15     Prolonged event with multiple shocks, CPR, multiple meds     Post arrest echo with EF down, + WMA     Mechanism suspect to be embolic  4. Multisystem organ failure     Intubated, d/w RN pending planned extubation today     ARI     Abn/elevated LFTs   Pending POC with Dr. Caryl Comes  Signed, Tommye Standard, PA-C 11/17/2015 9:44 AM   The patient was seen and examined and note modified  Physical examination was notable for a mildly low blood pressure 95, mechanical S1 and early systolic murmur no significant edema. Telemetry demonstrated complete heart block with escape  The patient had cardiac arrest 48 hours occurring in the context of interpolated PVCs and R on T. He underwent prolonged resuscitation. Post resuscitation echocardiogram demonstrated significant LV dysfunction and a discrete wall motion abnormality on the inferior wall.  Peak troponin was greater than 65, far in excess as would've been anticipated simply by resuscitation. These findings together suggest acute MI. In the absence of coronary disease, mostly presumed that it was embolic related to his mitral valve. There would be then no indication for secondary prevention for ICD implantation, VF having occurred in the context of an MI.  He has complete heart  block which will require pacing. It will require resynchronization pacing if he persists with LV dysfunction. I suspect he will. This will be reevaluated later in the week. In the event that he has persistent LV dysfunction, he would also be appropriately considered for ICD therapy if indeed he needs pacing for complete heart block. Guidelines allow for premature ICD implantation in the context of device being implanted for other more immediate concerns. This would be considered primary prevention.  I would stop his amiodarone; I discussed this with Dr. Faye Ramsay and DB  The prolonged resuscitation has given rise to multisystem organ dysfunction. The timing of the aforementioned interventions, i.e. catheterization followed by device implantation will be deferred until those begin to improve. In this regard transaminases are started to decrease and the rate if increase of his creatinine has slowed down.

## 2015-11-17 NOTE — Procedures (Signed)
History: 49 yo M s/p cardiac arrest.   Sedation: None  Technique: This is a 21 channel routine scalp EEG performed at the bedside with bipolar and monopolar montages arranged in accordance to the international 10/20 system of electrode placement. One channel was dedicated to EKG recording.    Background: The predominance of this EEG is performed during the drowsy or sleep state. During the waking period, however, there is a well-formed posterior dominant rhythm of 8.5 Hz that attenuates with eye opening. With drowsiness, there is anterior shifting of the PDR and sleep structures appear symmetric.  Photic stimulation: Physiologic driving is not performed  EEG Abnormalities: None  Clinical Interpretation: This normal EEG is recorded in the waking and sleep state. There was no seizure or seizure predisposition recorded on this study. Please note that a normal EEG does not preclude the possibility of epilepsy.   Roland Rack, MD [facility] [phone]  If 7pm- 7am, please page neurology on call as listed in [place].

## 2015-11-17 NOTE — Progress Notes (Addendum)
Subjective:   S/p prolonged cardiac arrest yesterday. Now awake on vent. No complaints.   Hemodynamics improved. On dopa 3. No further VT overnight. Remains in CHB underneath AV pacing.   LFTs and creatinine up a bit.      Intake/Output Summary (Last 24 hours) at 11/17/15 0857 Last data filed at 11/17/15 0600  Gross per 24 hour  Intake 5269.5 ml  Output    885 ml  Net 4384.5 ml    Current meds: . antiseptic oral rinse  7 mL Mouth Rinse QID  . aspirin EC  325 mg Oral Daily  . bisacodyl  10 mg Oral Daily   Or  . bisacodyl  10 mg Rectal Daily  . chlorhexidine gluconate  15 mL Mouth Rinse BID  . furosemide  40 mg Intravenous 3 times per day  . insulin aspart  0-20 Units Subcutaneous 6 times per day  . [START ON 11/18/2015] pantoprazole  40 mg Oral Daily  . pantoprazole (PROTONIX) IV  40 mg Intravenous Once  . piperacillin-tazobactam (ZOSYN)  IV  3.375 g Intravenous Q8H  . sodium chloride  3 mL Intravenous Q12H  . Warfarin - Physician Dosing Inpatient   Does not apply q1800   Infusions: . sodium chloride 100 mL/hr at 11/17/15 0600  . sodium chloride 250 mL (11/17/15 0600)  . amiodarone 30 mg/hr (11/17/15 0600)  . DOPamine 3 mcg/kg/min (11/17/15 0600)  . heparin 1,000 Units/hr (11/17/15 0600)     Objective:  Blood pressure 98/74, pulse 80, temperature 96.8 F (36 C), temperature source Axillary, resp. rate 24, weight 111.131 kg (245 lb), SpO2 97 %. Weight change: 8.8 kg (19 lb 6.4 oz)  Physical Exam: General:  Intubated. Awaked following commands  Able to count on fingers HEENT: normal Neck: supple. Carotids 2+ bilat; no bruits. No lymphadenopathy or thryomegaly appreciated. Cor: PMI nondisplaced. Regular rate & rhythm. Mechanical s1 Lungs: clear Abdomen: soft, nontender, nondistended. No hepatosplenomegaly. No bruits or masses. Good bowel sounds. Extremities: no cyanosis, clubbing, rash, tr-1+ edema Neuro: alert  cranial nerves grossly intact. moves all 4  extremities w/o difficulty. Affect pleasant  Telemetry: AV paced CHB underneath  Lab Results: Basic Metabolic Panel:  Recent Labs Lab 11/13/15 2115  11/14/15 0506 11/14/15 1847 11/15/15 0444 11/16/15 1110 11/16/15 2238 11/17/15 0443  NA  --   < > 136  --  134* 141 138 137  K  --   < > 4.5  --  3.7 3.5 3.8 4.2  CL  --   < > 106  --  102 103 102 99*  CO2  --   --  23  --  26 18* 27 28  GLUCOSE  --   < > 107*  --  128* 291* 120* 98  BUN  --   < > 10  --  7 10 21* 26*  CREATININE 0.96  < > 0.85 1.03 0.79 1.15 2.08* 2.46*  CALCIUM  --   --  8.3*  --  8.4* 6.6* 7.2* 7.3*  MG 2.6*  --  2.3 2.2  --   --   --   --   < > = values in this interval not displayed. Liver Function Tests:  Recent Labs Lab 11/11/15 1037 11/16/15 1110 11/16/15 2238  AST 16 213* 864*  ALT 13* 77* 323*  ALKPHOS 57 314* 443*  BILITOT 0.8 0.4 0.6  PROT 6.3* 3.3* 4.5*  ALBUMIN 3.3* 1.5* 1.9*   No results for input(s): LIPASE, AMYLASE in  the last 168 hours. No results for input(s): AMMONIA in the last 168 hours. CBC:  Recent Labs Lab 11/14/15 0506 11/14/15 1847 11/15/15 0444 11/16/15 1110 11/17/15 0443  WBC 8.8 10.2 11.2* 9.2 15.2*  HGB 12.7* 12.8* 11.6* 9.9* 12.1*  HCT 37.4* 39.1 35.4* 31.6* 35.6*  MCV 88.8 89.7 89.6 93.2 86.6  PLT 133* 146* 132* 126* 141*   Cardiac Enzymes:  Recent Labs Lab 11/16/15 1110 11/16/15 1500 11/16/15 2238  CKTOTAL 158  --   --   CKMB 7.0*  --   --   TROPONINI 2.95* 10.64* >65.00*   BNP: Invalid input(s): POCBNP CBG:  Recent Labs Lab 11/16/15 1559 11/16/15 1948 11/16/15 2335 11/17/15 0338 11/17/15 0734  GLUCAP 296* 233* 80 38 79   Microbiology: Lab Results  Component Value Date   CULT NO GROWTH 5 DAYS 05/29/2015   CULT NO GROWTH 5 DAYS 05/29/2015   No results for input(s): CULT, SDES in the last 168 hours.  Imaging: Dg Chest Port 1 View  11/17/2015  CLINICAL DATA:  Status post mitral valve replacement on November 13, 2015 EXAM: PORTABLE  CHEST 1 VIEW COMPARISON:  Portable chest x-ray of November 16, 2015 FINDINGS: The lungs are reasonably well inflated. The pulmonary interstitial markings are slightly more conspicuous today. The cardiac silhouette is enlarged. The prosthetic mitral valve ring is again demonstrated. The pulmonary vascularity prominent centrally. There is no pleural effusion. The endotracheal tube tip lies 5 cm above the carina. The esophagogastric tube tip projects below the inferior margin of the image. The 2 right-sided chest tubes are in stable position. IMPRESSION: Slight interval increase in the pulmonary interstitial markings suggests low-grade interstitial edema. Stable enlargement of the cardiac silhouette with stable to slightly increased central pulmonary vascular congestion. The support tubes are in reasonable position. Electronically Signed   By: David  Martinique M.D.   On: 11/17/2015 07:20   Dg Chest Portable 1 View  11/16/2015  CLINICAL DATA:  Endotracheal tube placement. EXAM: PORTABLE CHEST 1 VIEW COMPARISON:  Chest x-ray dated 11/15/2015. FINDINGS: Endotracheal tube has been placed in the interval and appears well positioned with tip approximately 2.5 cm above the level of the carina. Enteric tube passes below the diaphragm but with side holes just above the level of the gastroesophageal junction. Cardiomegaly is stable. Overall cardiomediastinal silhouette is unchanged in size or configuration. Two right-sided chest tubes appear stable in position. Lungs are clear. IMPRESSION: 1. Nasogastric tube tip is just below the level of the diaphragm, with proximal side holes just above the level of the gastroesophageal junction. Recommend advancement. 2. Endotracheal tube well positioned with tip just above the level of the carina. 3. Stable position of the right-sided chest tubes. 4. Stable cardiomegaly. 5. Lungs are clear, perhaps mild atelectasis at the right lung base. Electronically Signed   By: Franki Cabot M.D.    On: 11/16/2015 12:51     ASSESSMENT:  1. Cardiac arrest VT/VF - 11/16/15 2. 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 Cardiogenic shock s/p cardiac arrest 4. Probable ischemic cardiomyopathy EF 25-30% 5. AKI 6. Shock liver 7. Acute respiratory failure due to #1 8. CHB  PLAN/DISCUSSION:  He had made a remarkable recovery overnight. Awake on vent. Hemodynamics much improved. Suspect he has had an acute RCA infarct possibly due to thrombotic event off MVR. Will increase heparin to full dose. Continue ASA.  Plan TEE later today to look at valve and exclude aortic dissection.   Hopefully can wean from vent  later today. Will plan cath once renal function improved. Will need code lines out. Continue broad spectrum abx for lines and aspiration.   EP to see for possible pacer.   D/w TCTS and CCM  The patient is critically ill with multiple organ systems failure and requires high complexity decision making for assessment and support, frequent evaluation and titration of therapies, application of advanced monitoring technologies and extensive interpretation of multiple databases.   Critical Care Time devoted to patient care services described in this note is 35 Minutes.     LOS: 4 days    Glori Bickers, MD 11/17/2015, 8:57 AM

## 2015-11-17 NOTE — Progress Notes (Signed)
[address]       [place] [address]             [phone]        CARDIOTHORACIC SURGERY PROGRESS NOTE   R4 Days Post-Op Procedure(s) (LRB): TRANSESOPHAGEAL ECHOCARDIOGRAM (TEE) (N/A) MINIMALLY INVASIVE MITRAL VALVE (MV) REPLACEMENT (Right)  Subjective: Wide awake and alert on vent.  Neuro intact.  Denies any pain in chest or SOB.  Breathing comfortably w/ O2 sats 97% on 40% FiO2.  Asking "what happened?"  Objective: Vital signs: BP Readings from Last 1 Encounters:  11/17/15 98/74   Pulse Readings from Last 1 Encounters:  11/17/15 80   Resp Readings from Last 1 Encounters:  11/17/15 24   Temp Readings from Last 1 Encounters:  11/17/15 96.8 F (36 C) Axillary    Hemodynamics:    Physical Exam:  Rhythm:   DDD pacing - V threshold 4 mV - CHB under pacer  Breath sounds: Coarse but clear  Heart sounds:  RRR w/ mechanical sounds  Incisions:  Dressing dry, intact  Abdomen:  Soft, non-distended, non-tender  Extremities:  Warm, well-perfused  Chest tubes:  Low volume thin serosanguinous output, no air leak    Intake/Output from previous day: 12/18 0701 - 12/19 0700 In: 5269.5 [I.V.:4239.5; NG/GT:30; IV Piggyback:1000] Out: 885 [Urine:385; Emesis/NG output:100; Chest Tube:400] Intake/Output this shift:    Lab Results:  CBC: Recent Labs  11/16/15 1110 11/17/15 0443  WBC 9.2 15.2*  HGB 9.9* 12.1*  HCT 31.6* 35.6*  PLT 126* 141*    BMET:  Recent Labs  11/16/15 2238 11/17/15 0443  NA 138 137  K 3.8 4.2  CL 102 99*  CO2 27 28  GLUCOSE 120* 98  BUN 21* 26*  CREATININE 2.08* 2.46*  CALCIUM 7.2* 7.3*     PT/INR:   Recent Labs  11/17/15 0443  LABPROT 19.6*  INR 1.66*    CBG (last 3)   Recent Labs  11/16/15 2335 11/17/15 0338 11/17/15 0734  GLUCAP 80 83 79    ABG    Component Value Date/Time   PHART 7.596* 11/17/2015 0814   PCO2ART 33.6* 11/17/2015 0814   PO2ART 87.0 11/17/2015 0814   HCO3 33.0* 11/17/2015  0814   TCO2 34 11/17/2015 0814   ACIDBASEDEF 2.0 11/16/2015 1333   O2SAT 98.0 11/17/2015 0814    CXR: PORTABLE CHEST 1 VIEW  COMPARISON: Portable chest x-ray of November 16, 2015  FINDINGS: The lungs are reasonably well inflated. The pulmonary interstitial markings are slightly more conspicuous today. The cardiac silhouette is enlarged. The prosthetic mitral valve ring is again demonstrated. The pulmonary vascularity prominent centrally. There is no pleural effusion. The endotracheal tube tip lies 5 cm above the carina. The esophagogastric tube tip projects below the inferior margin of the image. The 2 right-sided chest tubes are in stable position.  IMPRESSION: Slight interval increase in the pulmonary interstitial markings suggests low-grade interstitial edema. Stable enlargement of the cardiac silhouette with stable to slightly increased central pulmonary vascular congestion. The support tubes are in reasonable position.   Electronically Signed  By: David Martinique M.D.  On: 11/17/2015 07:20  Assessment/Plan: S/P Procedure(s) (LRB): TRANSESOPHAGEAL ECHOCARDIOGRAM (TEE) (N/A) MINIMALLY INVASIVE MITRAL VALVE (MV) REPLACEMENT (Right)  Currently remarkably stable maintaining AV paced rhythm w/ stable BP off all pressors except dopamine @ 3 mcg/kg/min Oxygenation remarkably good and only mild pulm vasc congestion on CXR S/P VF arrest - question primary pacer-induced arrhythmia versus acute MI - will  need cath at some point Possible aspiration at the time of CODE - continue empiric antibiotics for possible aspiration pneumonitis   TEE this morning to further evaluate valve, LV function, r/o aortic dissection  Proceed with vent wean if TEE looks okay  Insert PICC line and d/c lines placed during CODE  Hold coumadin and continue heparin for now  EPS consult for pacer/ICD placement   [name], MD 11/17/2015 8:42 AM

## 2015-11-17 NOTE — Progress Notes (Signed)
EEG completed; results pending.    

## 2015-11-17 NOTE — Procedures (Signed)
Extubation Procedure Note  Patient Details:   Name: Oscar Brown DOB: [birth year] MRN: [id number]   Airway Documentation:     Evaluation  O2 sats: stable throughout Complications: No apparent complications Patient did tolerate procedure well. Bilateral Breath Sounds: Diminished Suctioning: Airway Yes  Carson Myrtle 11/17/2015, 2:21 PM

## 2015-11-17 NOTE — Progress Notes (Signed)
Peripherally Inserted Central Catheter/Midline Placement  The IV Nurse has discussed with the patient and/or persons authorized to consent for the patient, the purpose of this procedure and the potential benefits and risks involved with this procedure.  The benefits include less needle sticks, lab draws from the catheter and patient may be discharged home with the catheter.  Risks include, but not limited to, infection, bleeding, blood clot (thrombus formation), and puncture of an artery; nerve damage and irregular heat beat.  Alternatives to this procedure were also discussed.  PICC/Midline Placement Documentation        Oscar Brown 11/17/2015, 9:45 AM Consent obtained from daughter at bedside by Christella Noa, RN

## 2015-11-17 NOTE — Progress Notes (Signed)
[facility] Progress Note Patient Name: Oscar Brown DOB: 07-04-1966 MRN: [id number]   Date of Service  11/17/2015  HPI/Events of Note  ABG on 50%/PRVC 26/TV 550/P 5 = 7.6/30.5/95/30.3 c/w mixed metabolic and respiratory alkalosis.   eICU Interventions  Will order: 1. D/C NaHCO3 IV infusion. 2. Decrease PRVC rate to 20. 3. Recheck ABG at 7 AM.     Intervention Category Major Interventions: Acid-Base disturbance - evaluation and management  Sommer,Steven Eugene 11/17/2015, 4:35 AM

## 2015-11-17 NOTE — CV Procedure (Signed)
    TRANSESOPHAGEAL ECHOCARDIOGRAM   NAME:  Oscar Brown   MRN: [id number] DOB:  July 29, 1966   ADMIT DATE: 11/13/2015  INDICATIONS:   PROCEDURE:   Informed consent was obtained prior to the procedure.  Risks include, but are not limited to, cough, sore throat, vomiting, nausea, somnolence, esophageal and stomach trauma or perforation, bleeding, low blood pressure, aspiration, pneumonia, infection, trauma to the teeth and death.    The patient was awake on the ventilator. After a procedural time-out, the patient was given 7 mg versed and 100 mcg fentanyl for moderate sedation.  The transesophageal probe was inserted in the esophagus and stomach without difficulty and multiple views were obtained.    COMPLICATIONS:    There were no immediate complications.  FINDINGS:  LEFT VENTRICLE: EF = 35-40% with severe inferolateral HK  RIGHT VENTRICLE: Dilated. Mild to moderate HK  LEFT ATRIUM: Dilated. No clot.   LEFT ATRIAL APPENDAGE: Not visualized  RIGHT ATRIUM: Dilated. No clot  AORTIC VALVE:  Trileaflet. Trivial AI.   MITRAL VALVE:    Mechanical valve. Normal function. No clot.   TRICUSPID VALVE: Normal. Moderate TR.   PULMONIC VALVE: Grossly normal.  INTERATRIAL SEPTUM: No PFO or ASD.  PERICARDIUM: No effusion  AORTA: No dissection.  No significant plaque.   Bensimhon, Daniel,MD 11:07 AM

## 2015-11-17 NOTE — Progress Notes (Addendum)
Patient ID: Oscar Brown, male   DOB: 1966-08-18, 49 y.o.   MRN: [id number] EVENING ROUNDS NOTE :     [address]       [place] [address]             [phone]                 4 Days Post-Op Procedure(s) (LRB): TRANSESOPHAGEAL ECHOCARDIOGRAM (TEE) (N/A) MINIMALLY INVASIVE MITRAL VALVE (MV) REPLACEMENT (Right)  Total Length of Stay:  LOS: 4 days  BP 98/71 mmHg  Pulse 79  Temp(Src) 99.3 F (37.4 C) (Oral)  Resp 12  Wt 245 lb (111.131 kg)  SpO2 100%  .Intake/Output      12/18 0701 - 12/19 0700 12/19 0701 - 12/20 0700   P.O.     I.V. (mL/kg) 4272 (38.4) 395.6 (3.6)   NG/GT 30    IV Piggyback 1000 50   Total Intake(mL/kg) 5302 (47.7) 445.6 (4)   Urine (mL/kg/hr) 385 (0.1) 580 (0.5)   Emesis/NG output 100 (0)    Chest Tube 400 (0.1) 190 (0.2)   Total Output 885 770   Net +4417 -324.4          . sodium chloride 10 mL/hr at 11/17/15 1300  . sodium chloride 250 mL (11/17/15 0600)  . DOPamine Stopped (11/17/15 1446)  . heparin 1,150 Units/hr (11/17/15 1500)     Lab Results  Component Value Date   WBC 15.2* 11/17/2015   HGB 12.1* 11/17/2015   HCT 35.6* 11/17/2015   PLT 141* 11/17/2015   GLUCOSE 98 11/17/2015   CHOL 187 09/22/2015   TRIG 54 09/22/2015   HDL 60 09/22/2015   LDLCALC 116* 09/22/2015   ALT 328* 11/17/2015   AST 762* 11/17/2015   NA 137 11/17/2015   K 4.2 11/17/2015   CL 99* 11/17/2015   CREATININE 2.46* 11/17/2015   BUN 26* 11/17/2015   CO2 28 11/17/2015   INR 1.66* 11/17/2015   HGBA1C 5.7* 11/11/2015   Stable day, ddd paced  Now extubated, alert and talkative, says does not remember much about past 24 hours  [place] [phone] Office [phone] 11/17/2015 5:54 PM

## 2015-11-17 NOTE — Progress Notes (Signed)
Patient was up in bed eating with family in room. Patient in no distress. SPO2 97%. Told RN to call an hour after patient has ate and drank if patient is ready to go on BIPAP QHS per Critical care note.

## 2015-11-17 NOTE — Progress Notes (Signed)
Hypoglycemic Event  CBG: 141  Treatment: 4 oz juice  Symptoms: none  Follow-up CBG: Time:1615 CBG Result:141  Possible Reasons for Event:  Comments/MD notified    Oscar Brown

## 2015-11-18 ENCOUNTER — Inpatient Hospital Stay (HOSPITAL_COMMUNITY): Payer: Managed Care, Other (non HMO)

## 2015-11-18 DIAGNOSIS — I4892 Unspecified atrial flutter: Secondary | ICD-10-CM

## 2015-11-18 DIAGNOSIS — N179 Acute kidney failure, unspecified: Secondary | ICD-10-CM

## 2015-11-18 LAB — GLUCOSE, CAPILLARY
GLUCOSE-CAPILLARY: 103 mg/dL — AB (ref 65–99)
GLUCOSE-CAPILLARY: 131 mg/dL — AB (ref 65–99)
GLUCOSE-CAPILLARY: 73 mg/dL (ref 65–99)
GLUCOSE-CAPILLARY: 94 mg/dL (ref 65–99)
Glucose-Capillary: 100 mg/dL — ABNORMAL HIGH (ref 65–99)
Glucose-Capillary: 106 mg/dL — ABNORMAL HIGH (ref 65–99)

## 2015-11-18 LAB — LACTIC ACID, PLASMA: Lactic Acid, Venous: 1.7 mmol/L (ref 0.5–2.0)

## 2015-11-18 LAB — CBC
HEMATOCRIT: 33 % — AB (ref 39.0–52.0)
Hemoglobin: 11.1 g/dL — ABNORMAL LOW (ref 13.0–17.0)
MCH: 29.9 pg (ref 26.0–34.0)
MCHC: 33.6 g/dL (ref 30.0–36.0)
MCV: 88.9 fL (ref 78.0–100.0)
PLATELETS: 162 10*3/uL (ref 150–400)
RBC: 3.71 MIL/uL — ABNORMAL LOW (ref 4.22–5.81)
RDW: 13.8 % (ref 11.5–15.5)
WBC: 15.4 10*3/uL — AB (ref 4.0–10.5)

## 2015-11-18 LAB — HEPATIC FUNCTION PANEL
ALK PHOS: 326 U/L — AB (ref 38–126)
ALT: 325 U/L — AB (ref 17–63)
AST: 319 U/L — ABNORMAL HIGH (ref 15–41)
Albumin: 2.1 g/dL — ABNORMAL LOW (ref 3.5–5.0)
BILIRUBIN DIRECT: 0.2 mg/dL (ref 0.1–0.5)
BILIRUBIN INDIRECT: 0.4 mg/dL (ref 0.3–0.9)
BILIRUBIN TOTAL: 0.6 mg/dL (ref 0.3–1.2)
Total Protein: 5 g/dL — ABNORMAL LOW (ref 6.5–8.1)

## 2015-11-18 LAB — BASIC METABOLIC PANEL
Anion gap: 14 (ref 5–15)
BUN: 44 mg/dL — AB (ref 6–20)
CALCIUM: 8.2 mg/dL — AB (ref 8.9–10.3)
CHLORIDE: 90 mmol/L — AB (ref 101–111)
CO2: 29 mmol/L (ref 22–32)
CREATININE: 4.55 mg/dL — AB (ref 0.61–1.24)
GFR calc non Af Amer: 14 mL/min — ABNORMAL LOW (ref >60)
GFR, EST AFRICAN AMERICAN: 16 mL/min — AB (ref >60)
Glucose, Bld: 141 mg/dL — ABNORMAL HIGH (ref 65–99)
Potassium: 4.9 mmol/L (ref 3.5–5.1)
Sodium: 133 mmol/L — ABNORMAL LOW (ref 135–145)

## 2015-11-18 LAB — TROPONIN I: TROPONIN I: 50.54 ng/mL — AB (ref <0.031)

## 2015-11-18 LAB — MAGNESIUM: Magnesium: 2.4 mg/dL (ref 1.7–2.4)

## 2015-11-18 LAB — HEPARIN LEVEL (UNFRACTIONATED)
HEPARIN UNFRACTIONATED: 0.28 [IU]/mL — AB (ref 0.30–0.70)
Heparin Unfractionated: 0.19 IU/mL — ABNORMAL LOW (ref 0.30–0.70)
Heparin Unfractionated: 0.26 IU/mL — ABNORMAL LOW (ref 0.30–0.70)
Heparin Unfractionated: 0.27 IU/mL — ABNORMAL LOW (ref 0.30–0.70)

## 2015-11-18 LAB — PROTIME-INR
INR: 1.8 — ABNORMAL HIGH (ref 0.00–1.49)
Prothrombin Time: 20.9 seconds — ABNORMAL HIGH (ref 11.6–15.2)

## 2015-11-18 LAB — PHOSPHORUS: Phosphorus: 5.5 mg/dL — ABNORMAL HIGH (ref 2.5–4.6)

## 2015-11-18 MED ORDER — ASPIRIN EC 81 MG PO TBEC
81.0000 mg | DELAYED_RELEASE_TABLET | Freq: Every day | ORAL | Status: DC
  Administered 2015-11-18 – 2015-11-24 (×7): 81 mg via ORAL
  Filled 2015-11-18 (×7): qty 1

## 2015-11-18 MED FILL — Medication: Qty: 1 | Status: AC

## 2015-11-18 NOTE — Progress Notes (Signed)
ANTICOAGULATION CONSULT NOTE - Follow Up Consult  Pharmacy Consult for Heparin Indication: MVR  Allergies  Allergen Reactions  . Iohexol Hives and Itching    Patient needs 13 hour prep per Dr. Pascal Lux, after cath he broke out in hives and had itching     Patient Measurements: Height: 5\' 8"  (172.7 cm) Weight: 246 lb 14.6 oz (112 kg) IBW/kg (Calculated) : 68.4 Heparin Dosing Weight: 90.5 kg  Vital Signs: Temp: 98.3 F (36.8 C) (12/20 1222) Temp Source: Oral (12/20 1222) BP: 98/76 mmHg (12/20 1500) Pulse Rate: 83 (12/20 1500)  Labs:  Recent Labs  11/16/15 1110 11/16/15 1500  11/16/15 2238 11/17/15 0443  11/18/15 11/18/15 0400 11/18/15 0600 11/18/15 0745 11/18/15 1440  HGB 9.9*  --   --   --  12.1*  --   --  11.1*  --   --   --   HCT 31.6*  --   --   --  35.6*  --   --  33.0*  --   --   --   PLT 126*  --   --   --  141*  --   --  162  --   --   --   LABPROT 19.8*  --   --   --  19.6*  --   --  20.9*  --   --   --   INR 1.69*  --   --   --  1.66*  --   --  1.80*  --   --   --   HEPARINUNFRC  --   --   < >  --   --   < > 0.27*  --  0.26*  --  0.28*  CREATININE 1.15  --   --  2.08* 2.46*  --   --   --   --  4.55*  --   CKTOTAL 158  --   --   --   --   --   --   --   --   --   --   CKMB 7.0*  --   --   --   --   --   --   --   --   --   --   TROPONINI 2.95* 10.64*  --  >65.00*  --   --   --  50.54*  --   --   --   < > = values in this interval not displayed.  Estimated Creatinine Clearance: 23.8 mL/min (by C-G formula based on Cr of 4.55).   Medications:  Heparin at 1500 units/hr (15 ml/hr)   Assessment: 49yo male with history of HTN, OSA, DVT and here for minimally invasive MVR. Pharmacy is consulted to dose heparin for MVR, dosed conservatively per Dr. Roxan Hockey initially and with a lower goal. MD holding warfarin. INR rising despite holding - up to 1.8 this AM.   Heparin level this afternoon is slightly SUBtherapeutic (HL 0.28 << 0.26, goal of 0.3-0.35 per Dr.  Roxan Hockey). CBC okay, no active bleeding noted.   Goal of Therapy:  Heparin level 0.3-0.35 units/ml Monitor platelets by anticoagulation protocol: Yes  Plan:  1. Increase Heparin to 1550 units/hr (15.5 ml/hr) 2. Will continue to monitor for any signs/symptoms of bleeding and will follow up with heparin level in 6 hours   Alycia Rossetti, PharmD, BCPS Clinical Pharmacist Pager: [phone] 11/18/2015 3:41 PM

## 2015-11-18 NOTE — Progress Notes (Signed)
Subjective:   Feels good today. Sitting up in chair. Chest sore.   Lines out. PICC line in. Off inotropes.  Remains in CHB underneath AV pacing.   LFTs coming down. Creatinine up to 4.5. Urine output ok.   TEE with EF 35-40% no vegetation on valve or dissection.      Intake/Output Summary (Last 24 hours) at 11/18/15 1056 Last data filed at 11/18/15 1010  Gross per 24 hour  Intake 1602.55 ml  Output   1455 ml  Net 147.55 ml    Current meds: . antiseptic oral rinse  7 mL Mouth Rinse BID  . aspirin EC  81 mg Oral Daily  . bisacodyl  10 mg Oral Daily   Or  . bisacodyl  10 mg Rectal Daily  . insulin aspart  0-20 Units Subcutaneous 6 times per day  . pantoprazole  40 mg Oral Daily  . piperacillin-tazobactam (ZOSYN)  IV  3.375 g Intravenous Q8H  . sodium chloride  10-40 mL Intracatheter Q12H  . sodium chloride  3 mL Intravenous Q12H   Infusions: . sodium chloride 10 mL/hr at 11/18/15 1000  . sodium chloride 250 mL (11/18/15 1000)  . heparin 1,500 Units/hr (11/18/15 1000)     Objective:  Blood pressure 91/69, pulse 80, temperature 97.8 F (36.6 C), temperature source Oral, resp. rate 24, weight 112 kg (246 lb 14.6 oz), SpO2 97 %. Weight change: 0.869 kg (1 lb 14.6 oz)  Physical Exam: General:  Sitting in chair HEENT: normal Neck: supple. Carotids 2+ bilat; no bruits. No lymphadenopathy or thryomegaly appreciated. Cor: PMI nondisplaced. Regular rate & rhythm. Mechanical s1 Lungs: clear Abdomen: soft, nontender, nondistended. No hepatosplenomegaly. No bruits or masses. Good bowel sounds. Extremities: no cyanosis, clubbing, rash, tr edema Neuro: alert and oriented x 3 cranial nerves grossly intact. moves all 4 extremities w/o difficulty. Affect pleasant  Telemetry: AV paced CHB underneath  Lab Results: Basic Metabolic Panel:  Recent Labs Lab 11/13/15 2115  11/14/15 0506 11/14/15 1847 11/15/15 0444 11/16/15 1110 11/16/15 2238 11/17/15 0443 11/17/15 0856  11/18/15 0400 11/18/15 0745  NA  --   < > 136  --  134* 141 138 137  --   --  133*  K  --   < > 4.5  --  3.7 3.5 3.8 4.2  --   --  4.9  CL  --   < > 106  --  102 103 102 99*  --   --  90*  CO2  --   < > 23  --  26 18* 27 28  --   --  29  GLUCOSE  --   < > 107*  --  128* 291* 120* 98  --   --  141*  BUN  --   < > 10  --  7 10 21* 26*  --   --  44*  CREATININE 0.96  < > 0.85 1.03 0.79 1.15 2.08* 2.46*  --   --  4.55*  CALCIUM  --   < > 8.3*  --  8.4* 6.6* 7.2* 7.3*  --   --  8.2*  MG 2.6*  --  2.3 2.2  --   --   --   --  2.3 2.4  --   PHOS  --   --   --   --   --   --   --   --  2.1* 5.5*  --   < > = values in  this interval not displayed. Liver Function Tests:  Recent Labs Lab 11/16/15 1110 11/16/15 2238 11/17/15 0855 11/18/15 0400  AST 213* 864* 762* 319*  ALT 77* 323* 328* 325*  ALKPHOS 314* 443* 399* 326*  BILITOT 0.4 0.6 0.7 0.6  PROT 3.3* 4.5* 3.5* 5.0*  ALBUMIN 1.5* 1.9* 2.0* 2.1*   No results for input(s): LIPASE, AMYLASE in the last 168 hours. No results for input(s): AMMONIA in the last 168 hours. CBC:  Recent Labs Lab 11/14/15 1847 11/15/15 0444 11/16/15 1110 11/17/15 0443 11/18/15 0400  WBC 10.2 11.2* 9.2 15.2* 15.4*  HGB 12.8* 11.6* 9.9* 12.1* 11.1*  HCT 39.1 35.4* 31.6* 35.6* 33.0*  MCV 89.7 89.6 93.2 86.6 88.9  PLT 146* 132* 126* 141* 162   Cardiac Enzymes:  Recent Labs Lab 11/16/15 1110 11/16/15 1500 11/16/15 2238 11/18/15 0400  CKTOTAL 158  --   --   --   CKMB 7.0*  --   --   --   TROPONINI 2.95* 10.64* >65.00* 50.54*   BNP: Invalid input(s): POCBNP CBG:  Recent Labs Lab 11/17/15 1709 11/17/15 1945 11/18/15 0013 11/18/15 0342 11/18/15 0745  GLUCAP 141* 86 106* 103* 131*   Microbiology: Lab Results  Component Value Date   CULT NO GROWTH 5 DAYS 05/29/2015   CULT NO GROWTH 5 DAYS 05/29/2015   No results for input(s): CULT, SDES in the last 168 hours.  Imaging: Dg Chest Port 1 View  11/18/2015  CLINICAL DATA:  Status post  minimally invasive mitral valve replacement on November 13, 2015, history of chronic CHF EXAM: PORTABLE CHEST 1 VIEW COMPARISON:  Portable chest x-ray of November 17, 2015 FINDINGS: The trachea and esophagus have been extubated. The lungs are adequately inflated. The 2 right-sided chest tube remain. There is no pneumothorax or pleural effusion. There is atelectasis in the right perihilar region which has improved. There is persistent left lower lobe atelectasis which is stable. The cardiac silhouette is enlarged. The prosthetic mitral valve ring is visible. The pulmonary vascularity is less prominent today. IMPRESSION: Mild interval improvement in the pulmonary interstitium consistent with resolving interstitial edema. There is persistent bibasilar atelectasis greater on the left than on the right. There is no significant pleural effusion and no pneumothorax. The remaining support tubes are in reasonable position. Electronically Signed   By: David  Martinique M.D.   On: 11/18/2015 07:42   Dg Chest Port 1 View  11/17/2015  CLINICAL DATA:  Line placement. EXAM: PORTABLE CHEST 1 VIEW COMPARISON:  11/17/2015 at 0542 hours FINDINGS: Endotracheal tube remains in place, terminating well above the carina. A right PICC has been placed and terminates over the lower SVC. Enteric tube terminates near the GE junction. Right-sided chest tubes remain in place. Sequelae of prior mitral valve replacement are again identified. Cardiac silhouette remains enlarged. Bibasilar lung opacities are unchanged with very mild central pulmonary vascular congestion again noted. No pneumothorax is identified. IMPRESSION: 1. Right PICC terminates over the lower SVC. 2. Enteric tube terminates at the GE junction.  Suggest advancement. 3. Similar appearance of cardiomegaly, mild pulmonary vascular congestion, and bibasilar atelectasis. Electronically Signed   By: Logan Bores M.D.   On: 11/17/2015 11:26   Dg Chest Port 1 View  11/17/2015   CLINICAL DATA:  Status post mitral valve replacement on November 13, 2015 EXAM: PORTABLE CHEST 1 VIEW COMPARISON:  Portable chest x-ray of November 16, 2015 FINDINGS: The lungs are reasonably well inflated. The pulmonary interstitial markings are slightly more conspicuous today. The cardiac  silhouette is enlarged. The prosthetic mitral valve ring is again demonstrated. The pulmonary vascularity prominent centrally. There is no pleural effusion. The endotracheal tube tip lies 5 cm above the carina. The esophagogastric tube tip projects below the inferior margin of the image. The 2 right-sided chest tubes are in stable position. IMPRESSION: Slight interval increase in the pulmonary interstitial markings suggests low-grade interstitial edema. Stable enlargement of the cardiac silhouette with stable to slightly increased central pulmonary vascular congestion. The support tubes are in reasonable position. Electronically Signed   By: David  Martinique M.D.   On: 11/17/2015 07:20   Dg Chest Portable 1 View  11/16/2015  CLINICAL DATA:  Endotracheal tube placement. EXAM: PORTABLE CHEST 1 VIEW COMPARISON:  Chest x-ray dated 11/15/2015. FINDINGS: Endotracheal tube has been placed in the interval and appears well positioned with tip approximately 2.5 cm above the level of the carina. Enteric tube passes below the diaphragm but with side holes just above the level of the gastroesophageal junction. Cardiomegaly is stable. Overall cardiomediastinal silhouette is unchanged in size or configuration. Two right-sided chest tubes appear stable in position. Lungs are clear. IMPRESSION: 1. Nasogastric tube tip is just below the level of the diaphragm, with proximal side holes just above the level of the gastroesophageal junction. Recommend advancement. 2. Endotracheal tube well positioned with tip just above the level of the carina. 3. Stable position of the right-sided chest tubes. 4. Stable cardiomegaly. 5. Lungs are clear, perhaps  mild atelectasis at the right lung base. Electronically Signed   By: Franki Cabot M.D.   On: 11/16/2015 12:51     ASSESSMENT:  1. Cardiac arrest VT/VF - 11/16/15 2. 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 Cardiogenic shock s/p cardiac arrest 4. Probable ischemic cardiomyopathy EF 25-30% 5. AKI 6. Shock liver 7. Acute respiratory failure due to #1 8. CHB  PLAN/DISCUSSION:  Clinical data supports that he likely had embolus down RCA resulting in VF arrest. Now stabilized.   He has made a remarkable recovery. Hemodynamics improved. Two major issues remain are 1) AKI related to ATN from prolonged code. Rate of rise of creatinine is quite concerning and suspect creatinine will continue to worsen for few days. Fortunately he is still making urine. Will follow clinically. LFTs are tending down which is good. 2) Complete heart block. Remains paced. Discussed with EP and the plan was to repeat echo after cath and decide on PPM vs CRT-D. Given AKI likely will defer cath for several weeks. Once renal function improves will repeat echo to look at [facility] and discuss again with EP.   Continue heparin. If going to defer cath may be able to start coumadin back now. Will d/w Dr. Roxy Manns. Would continue abx for at least 7 days.      LOS: 5 days    Glori Bickers, MD 11/18/2015, 10:56 AM

## 2015-11-18 NOTE — Progress Notes (Addendum)
Patient was not ready for CPAP at this time. Patient brought home CPAP machine, tubing, and home nasal mask with him. Wires in good condition, machine in good condition. Patient can place self on and off. Family at bedside along with RN. RT made patient and family aware that if needing any help or have questions to call.

## 2015-11-18 NOTE — Progress Notes (Signed)
ANTICOAGULATION CONSULT NOTE - Follow Up Consult  Pharmacy Consult for Heparin  Indication: MVR  Allergies  Allergen Reactions  . Iohexol Hives and Itching    Patient needs 13 hour prep per Dr. Pascal Lux, after cath he broke out in hives and had itching    Patient Measurements: Height: 5\' 8"  (172.7 cm) Weight: 246 lb 14.6 oz (112 kg) IBW/kg (Calculated) : 68.4  Vital Signs: Temp: 98.3 F (36.8 C) (12/20 1922) Temp Source: Oral (12/20 1922) BP: 92/64 mmHg (12/20 2200) Pulse Rate: 145 (12/20 2200)  Labs:  Recent Labs  11/16/15 1110 11/16/15 1500  11/16/15 2238 11/17/15 0443  11/18/15 0400 11/18/15 0600 11/18/15 0745 11/18/15 1440 11/18/15 2230  HGB 9.9*  --   --   --  12.1*  --  11.1*  --   --   --   --   HCT 31.6*  --   --   --  35.6*  --  33.0*  --   --   --   --   PLT 126*  --   --   --  141*  --  162  --   --   --   --   LABPROT 19.8*  --   --   --  19.6*  --  20.9*  --   --   --   --   INR 1.69*  --   --   --  1.66*  --  1.80*  --   --   --   --   HEPARINUNFRC  --   --   < >  --   --   < >  --  0.26*  --  0.28* 0.19*  CREATININE 1.15  --   --  2.08* 2.46*  --   --   --  4.55*  --   --   CKTOTAL 158  --   --   --   --   --   --   --   --   --   --   CKMB 7.0*  --   --   --   --   --   --   --   --   --   --   TROPONINI 2.95* 10.64*  --  >65.00*  --   --  50.54*  --   --   --   --   < > = values in this interval not displayed.  Estimated Creatinine Clearance: 23.8 mL/min (by C-G formula based on Cr of 4.55).   Assessment: Sub-therapeutic heparin level, no issues per RN.   Goal of Therapy:  Heparin level 0.3-0.7 units/ml Monitor platelets by anticoagulation protocol: Yes   Plan:  -Increase heparin to 1700 units/hr -0800 HL -Daily CBC/HL -Monitor   Narda Bonds 11/18/2015,11:22 PM

## 2015-11-18 NOTE — Progress Notes (Signed)
Patient placed himself on home CPAP unit for the night.  

## 2015-11-18 NOTE — Progress Notes (Signed)
ANTICOAGULATION CONSULT NOTE - Follow Up Consult  Pharmacy Consult for heparin Indication: MVR   Labs:  Recent Labs  11/16/15 1110 11/16/15 1500  11/16/15 2238 11/17/15 0443  11/17/15 1545 11/18/15 11/18/15 0400 11/18/15 0600  HGB 9.9*  --   --   --  12.1*  --   --   --  11.1*  --   HCT 31.6*  --   --   --  35.6*  --   --   --  33.0*  --   PLT 126*  --   --   --  141*  --   --   --  162  --   LABPROT 19.8*  --   --   --  19.6*  --   --   --  20.9*  --   INR 1.69*  --   --   --  1.66*  --   --   --  1.80*  --   HEPARINUNFRC  --   --   < >  --   --   < > 0.15* 0.27*  --  0.26*  CREATININE 1.15  --   --  2.08* 2.46*  --   --   --   --   --   CKTOTAL 158  --   --   --   --   --   --   --   --   --   CKMB 7.0*  --   --   --   --   --   --   --   --   --   TROPONINI 2.95* 10.64*  --  >65.00*  --   --   --   --  50.54*  --   < > = values in this interval not displayed.    Assessment: 49yo male remains subtherapeutic on heparin despite rate increase.  Goal of Therapy:  Heparin level 0.3-0.35 units/ml   Plan:  Will increase heparin gtt very slightly to 1500 units/hr and check level in [place], PharmD, BCPS  11/18/2015,6:55 AM

## 2015-11-18 NOTE — Evaluation (Signed)
Physical Therapy Evaluation Patient Details Name: Oscar Brown MRN: [id number] DOB: [birth year] Today's Date: 11/18/2015   History of Present Illness  pt is a 49 y/o male with mitral regurg., lymphedema, OSA, CHF, s/p minimally invasive MVR.  Cardiac arrest 12/18 due to heart block, intubated, 12/19 extubated.  Clinical Impression  Pt admitted with/for MVR.  Pt currently limited functionally due to the problems listed below.  (see problems list.)  Pt will benefit from PT to maximize function and safety to be able to get home safely with available assist of family.     Follow Up Recommendations No PT follow up;Supervision for mobility/OOB (up to 24 hour assist)    Equipment Recommendations  None recommended by PT    Recommendations for Other Services       Precautions / Restrictions Precautions Precautions: Other (comment) (external pacer)      Mobility  Bed Mobility                  Transfers Overall transfer level: Needs assistance   Transfers: Sit to/from Stand Sit to Stand: Min guard         General transfer comment: using no hands  Ambulation/Gait Ambulation/Gait assistance: Supervision Ambulation Distance (Feet): 660 Feet Assistive device: None Gait Pattern/deviations: Step-through pattern     General Gait Details: steady, but unable to increase speed appreciably.  Stairs            Wheelchair Mobility    Modified Rankin (Stroke Patients Only)       Balance Overall balance assessment: Needs assistance Sitting-balance support: No upper extremity supported Sitting balance-Leahy Scale: Good     Standing balance support: No upper extremity supported Standing balance-Leahy Scale: Fair                               Pertinent Vitals/Pain Pain Assessment: Faces Faces Pain Scale: Hurts little more Pain Location: vague Pain Descriptors / Indicators: Grimacing Pain Intervention(s): Monitored during session    Home  Living Family/patient expects to be discharged to:: Private residence Living Arrangements: Alone Available Help at Discharge: Family;Other (Comment) (Will work out up to 24/7 assist) Type of Home: House Home Access: Stairs to enter Entrance Stairs-Rails: None Technical brewer of Steps: 4 Home Layout: One level Home Equipment: None      Prior Function Level of Independence: Independent         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    Extremity/Trunk Assessment   Upper Extremity Assessment: Overall WFL for tasks assessed           Lower Extremity Assessment: Overall WFL for tasks assessed         Communication   Communication: No difficulties  Cognition Arousal/Alertness: Awake/alert Behavior During Therapy: WFL for tasks assessed/performed Overall Cognitive Status: Within Functional Limits for tasks assessed                      General Comments General comments (skin integrity, edema, etc.): vss, HR in the 80's, Sats low to mid 90%    Exercises        Assessment/Plan    PT Assessment Patient needs continued PT services  PT Diagnosis Acute pain;Other (comment) (decreased activity tolerance)   PT Problem List Decreased activity tolerance;Decreased mobility;Cardiopulmonary status limiting activity;Pain  PT Treatment Interventions Gait training;Stair training;Functional mobility training;Therapeutic activities;Patient/family education  PT Goals (Current goals can be found in the Care Plan section) Acute Rehab PT Goals Patient Stated Goal: home independent, back to work PT Goal Formulation: With patient Time For Goal Achievement: 12/02/15 Potential to Achieve Goals: Good    Frequency Min 3X/week   Barriers to discharge   pt lives alone, will have to utilize his daughters, friends, etc.    Co-evaluation               End of Session   Activity Tolerance: Patient tolerated  treatment well Patient left: in chair;with call bell/phone within reach;with family/visitor present Nurse Communication: Mobility status         Time: 1721-1745 PT Time Calculation (min) (ACUTE ONLY): 24 min   Charges:   PT Evaluation $Initial PT Evaluation Tier I: 1 Procedure PT Treatments $Gait Training: 8-22 mins   PT G Codes:        Oscar Brown, Oscar Brown 11/18/2015, 6:02 PM  11/18/2015  Donnella Sham, PT [phone]  (pager)

## 2015-11-18 NOTE — Progress Notes (Addendum)
SUBJECTIVE: The patient is doing well today.  At this time, he denies chest pain or shortness of breath.  Marland Kitchen antiseptic oral rinse  7 mL Mouth Rinse BID  . aspirin EC  81 mg Oral Daily  . bisacodyl  10 mg Oral Daily   Or  . bisacodyl  10 mg Rectal Daily  . insulin aspart  0-20 Units Subcutaneous 6 times per day  . pantoprazole  40 mg Oral Daily  . piperacillin-tazobactam (ZOSYN)  IV  3.375 g Intravenous Q8H  . sodium chloride  10-40 mL Intracatheter Q12H  . sodium chloride  3 mL Intravenous Q12H   . sodium chloride 10 mL/hr at 11/18/15 0800  . sodium chloride 250 mL (11/18/15 0800)  . heparin 1,500 Units/hr (11/18/15 0800)    OBJECTIVE: Physical Exam: Filed Vitals:   11/18/15 0700 11/18/15 0750 11/18/15 0800 11/18/15 0900  BP: 116/94  99/82 96/84  Pulse: 80  79 79  Temp:  97.8 F (36.6 C)    TempSrc:  Oral    Resp: 14  16 9   Weight:      SpO2: 100%  97% 98%    Intake/Output Summary (Last 24 hours) at 11/18/15 [id number] Last data filed at 11/18/15 0800  Gross per 24 hour  Intake 1459.85 ml  Output   1420 ml  Net  39.85 ml    Telemetry reveals AV paced rhythm, remains in CHB underneath pacer  GEN- The patient is extubated, alert and oriented x 3 today, does not recall the events of the 18th Head- normocephalic, atraumatic Eyes-  Sclera clear, conjunctiva pink Ears- hearing intact Oropharynx- clear Neck- supple Lungs- Clear to ausculation bilaterally, normal work of breathing Heart- Regular rate and rhythm, mechanical S1, no significant murmurs, no rubs or gallops GI- soft, NT, ND Extremities- no clubbing, cyanosis, or edema Skin- no rash or lesion Psych- euthymic mood, full affect Neuro- no gross deficits appreciated  LABS: Basic Metabolic Panel:  Recent Labs  11/17/15 0443 11/17/15 0856 11/18/15 0400 11/18/15 0745  NA 137  --   --  133*  K 4.2  --   --  4.9  CL 99*  --   --  90*  CO2 28  --   --  29  GLUCOSE 98  --   --  141*  BUN 26*  --   --   44*  CREATININE 2.46*  --   --  4.55*  CALCIUM 7.3*  --   --  8.2*  MG  --  2.3 2.4  --   PHOS  --  2.1* 5.5*  --    Liver Function Tests:  Recent Labs  11/17/15 0855 11/18/15 0400  AST 762* 319*  ALT 328* 325*  ALKPHOS 399* 326*  BILITOT 0.7 0.6  PROT 3.5* 5.0*  ALBUMIN 2.0* 2.1*   No results for input(s): LIPASE, AMYLASE in the last 72 hours. CBC:  Recent Labs  11/17/15 0443 11/18/15 0400  WBC 15.2* 15.4*  HGB 12.1* 11.1*  HCT 35.6* 33.0*  MCV 86.6 88.9  PLT 141* 162   Cardiac Enzymes:  Recent Labs  11/16/15 1110 11/16/15 1500 11/16/15 2238 11/18/15 0400  CKTOTAL 158  --   --   --   CKMB 7.0*  --   --   --   TROPONINI 2.95* 10.64* >65.00* 50.54*    RADIOLOGY: Dg Chest Port 1 View 11/18/2015  CLINICAL DATA:  Status post minimally invasive mitral valve replacement on November 13, 2015, history  of chronic CHF EXAM: PORTABLE CHEST 1 VIEW COMPARISON:  Portable chest x-ray of November 17, 2015 FINDINGS: The trachea and esophagus have been extubated. The lungs are adequately inflated. The 2 right-sided chest tube remain. There is no pneumothorax or pleural effusion. There is atelectasis in the right perihilar region which has improved. There is persistent left lower lobe atelectasis which is stable. The cardiac silhouette is enlarged. The prosthetic mitral valve ring is visible. The pulmonary vascularity is less prominent today. IMPRESSION: Mild interval improvement in the pulmonary interstitium consistent with resolving interstitial edema. There is persistent bibasilar atelectasis greater on the left than on the right. There is no significant pleural effusion and no pneumothorax. The remaining support tubes are in reasonable position. Electronically Signed   By: David  Martinique M.D.   On: 11/18/2015 07:42    ASSESSMENT AND PLAN:  1. Severe MR  S/p MVR (mechanical)  POD # 4      On coumadin/heparin gtt, INR 1.8  2. Post-op CHB  Continues pacing via  epicardial wires (set DDD 80bpm)     Will plan BiV device, pacing or ICD pending course  3. VF arrest 11/16/15  Prolonged event with multiple shocks, CPR, multiple meds  Post arrest echo with EF down, + WMA     TEE yesterday EF = 35-40% with severe inferolateral HK, MITRAL VALVE: Mechanical valve. Normal function. No clot.   Mechanism suspect to be embolic, plan is to re-cath with cardiology when able  4. Multisystem organ dysfunction, post code  extubated     Off pressors with SBP 90's-low 100's  ARI, worse today  Abn/elevated LFTs, improving   5. leukocytosis     Afebrile     Continue with primary care team   Tommye Standard, PA-C 11/18/2015 9:42 AM   The patient is alert. I have explained to him the events of the weekend. Interrogation of his epicardial pacer demonstrates that he is in atrial fibrillation which has begun sometime in the last 24 hours. He has a ventricular escape rate at about 45 bpm.  The aggravation associated with his inferior wall MI may be contributing and may not as the patient's complete heart block antedated the event. Occurring in the context of his mitral valve replacement I suspect he will need device implantation. At that juncture we will need to make a decision as to whether he received a CRT P or D. I will arrange for an echocardiogram tomorrow to reassess LV function to help with that decision  The current plan is to consider deferring catheterization because of the abrupt in significant creatinine increased over the last 24 hours. Repeat creatinine is pending in the morning  Spoke with Dr Roxy Manns  He is currently in Atrial flutter which is new since yesterday.  He could be cardioverted

## 2015-11-18 NOTE — Progress Notes (Signed)
[facility] BRIEF SICU PROGRESS NOTE  5 Days Post-Op  S/P Procedure(s) (LRB): TRANSESOPHAGEAL ECHOCARDIOGRAM (TEE) (N/A) MINIMALLY INVASIVE MITRAL VALVE (MV) REPLACEMENT (Right)   Stable day Maintaining stable paced rhythm w/ stable BP UOP adequate Creatinine up to 4.5 today  Plan: I agree w/ plans to postpone cath until renal function has stabilized.  However, I don't think we can afford to postpone placement of permanent pacemaker +/- ICD too long.  Mr Muenchow was in 3rd degree AV block and pacer-dependent prior to his cardiac arrest 2 days ago.  His temporary pacemaker wires are working fine currently but how long they might last is impossible to predict.  I think he should have permanent pacer implanted by the end of the week regardless of whether or not he has cardiac catheterization done.  [name], MD 11/18/2015 5:45 PM

## 2015-11-18 NOTE — Progress Notes (Addendum)
ANTICOAGULATION CONSULT NOTE - Follow Up Consult  Pharmacy Consult for Heparin Indication: MVR  Allergies  Allergen Reactions  . Iohexol Hives and Itching    Patient needs 13 hour prep per Dr. Pascal Lux, after cath he broke out in hives and had itching     Patient Measurements: Weight: 245 lb (111.131 kg) Heparin Dosing Weight: 90.5 kg  Vital Signs: Temp: 98.3 F (36.8 C) (12/20 0014) Temp Source: Oral (12/20 0014) BP: 95/70 mmHg (12/20 0000) Pulse Rate: 80 (12/20 0013)  Labs:  Recent Labs  11/15/15 0444 11/16/15 0230 11/16/15 1110 11/16/15 1500  11/16/15 2238 11/17/15 0443 11/17/15 0810 11/17/15 1545 11/18/15  HGB 11.6*  --  9.9*  --   --   --  12.1*  --   --   --   HCT 35.4*  --  31.6*  --   --   --  35.6*  --   --   --   PLT 132*  --  126*  --   --   --  141*  --   --   --   LABPROT 17.1* 15.9* 19.8*  --   --   --  19.6*  --   --   --   INR 1.38 1.26 1.69*  --   --   --  1.66*  --   --   --   HEPARINUNFRC  --   --   --   --   < >  --   --  0.11* 0.15* 0.27*  CREATININE 0.79  --  1.15  --   --  2.08* 2.46*  --   --   --   CKTOTAL  --   --  158  --   --   --   --   --   --   --   CKMB  --   --  7.0*  --   --   --   --   --   --   --   TROPONINI  --   --  2.95* 10.64*  --  >65.00*  --   --   --   --   < > = values in this interval not displayed.  Estimated Creatinine Clearance: 43.9 mL/min (by C-G formula based on Cr of 2.46).   Medications:  Heparin at 1350 units/hr   Assessment: 49yo male with history of HTN, OSA, DVT - s/p minimally invasive MVR. Pharmacy is consulted to dose heparin for MVR, dosed conservatively per Dr. Roxan Hockey initially and with a lower goal. MD holding warfarin.  Heparin level this evening remains just slightly SUBtherapeutic. No bleeding noted.   Goal of Therapy:  Heparin level 0.3-0.35 units/ml Monitor platelets by anticoagulation protocol: Yes  Plan:  1. Increase Heparin slightly to 1450 units/hr  2. Will continue to monitor for  any signs/symptoms of bleeding and will follow up with heparin level in 6 hours   Sherlon Handing, PharmD, BCPS Clinical pharmacist, pager [phone] 11/18/2015 1:07 AM

## 2015-11-18 NOTE — Progress Notes (Signed)
Per Dr. Roxy Manns d/c foley if Creat is 2 or better; creat this AM 4.55; will keep foley at this time; will cont. To monitor.  Ruben Reason

## 2015-11-18 NOTE — Consult Note (Signed)
PULMONARY / CRITICAL CARE MEDICINE   Name: Oscar Brown MRN: [id number] DOB: 1966-07-06    ADMISSION DATE:  11/13/2015 CONSULTATION DATE:  12/18  REFERRING MD:  Roxan Hockey   CHIEF COMPLAINT:  Post cardiac arrest   BRIEF  49 yo male with hx HTN, OSA, remote hx DVT, mitral regurg admitted 12/15 for minimally invasive mitral valve replacement.  Post op course c/b difficulty with pacer wires and complete heart block, resolved with manipulation of wires and d/c of amiodarone and B blockers.  He was doing well on med-surg, ambulating and nearing ready for d/c when he had sudden cardiac arrest.  Initial rhythm VFib with prolonged downtime >45 minutes.  Now in SICU with persistent severe metabolic acidosis, persistent shock and ongoing arrhythmias.  Bedside echo with dismal EF per cards.  PCCM consulted to assist with vent management.    ANTIBIOTICS: 12/18>>> zosyn   SIGNIFICANT EVENTS: 12/15 minimally invasive mitral valve replacement  12/18 cardiac arrest  ETT 12/18>>> 12/19 Extubated    SUBJECTIVE:  11/18/2015 : Doing well on room air. Had CPAP overnight with no issues.  Normal mental status. Off low dose dopamine gtt. Still in complete heart block; effectively pacing. Creatinine trending up but urine out put is optimal. Cardiology and EP planning for a permanent pace maker, cardiac cath and TEE at some point when creatinine improves. Echo 12/18 with EF 20-25%.   ROS Constitutional: Denies fever and chills Respiratory: CPAP at hs, denies wheezing and SOB CV: Denies chest pain and palpitation GI: Denies nausea, vomiting and diarrhea [id number] generalized weakness but denies any joint swelling Neuro: Denies headache but reports difficulty remembering events Skin: Denies rash  VITAL SIGNS: BP 91/74 mmHg  Pulse 81  Temp(Src) 97.8 F (36.6 C) (Oral)  Resp 16  Ht 5\' 8"  (1.727 m)  Wt 112 kg (246 lb 14.6 oz)  BMI 37.55 kg/m2  SpO2 97%  HEMODYNAMICS:    VENTILATOR  SETTINGS: Vent Mode:  [-] CPAP;PSV FiO2 (%):  [40 %] 40 % Set Rate:  [20 bmp] 20 bmp Vt Set:  [550 mL] 550 mL PEEP:  [5 cmH20] 5 cmH20 Pressure Support:  [5 cmH20] 5 cmH20 Plateau Pressure:  [18 cmH20] 18 cmH20  INTAKE / OUTPUT: I/O last 3 completed shifts: In: 3907 [P.O.:600; I.V.:2877; NG/GT:30; IV Piggyback:400] Out: 2170 [id number]; Chest Tube:580]  PHYSICAL EXAMINATION: General:  Sitting in chair, no distress Neuro: alert and oriented with mild memory lapses, moves all four extremities, follows commands.  HEENT: PERRLA, oral mucosa moist, trachea midline Cardiovascular: HR irregular at 80 bmp, paced,  intermittent wide complex arrhythmia  Lungs:  resps even non labored on RA, bilateral airflow; no wheezing Abdomen:  Round, distended, normal bowel sounds Musculoskeletal:  Warm and dry, scant BLE edema   LABS:   PULMONARY  Recent Labs Lab 11/16/15 1200 11/16/15 1333 11/17/15 0423 11/17/15 0656 11/17/15 0814  PHART 7.355 7.409 7.608* 7.555* 7.596*  PCO2ART 36.5 34.5* 30.5* 34.3* 33.6*  PO2ART 154.0* 100.0 95.0 87.0 87.0  HCO3 20.6 22.0 30.7* 30.5* 33.0*  TCO2 22 23 32 32 34  O2SAT 99.0 98.0 99.0 98.0 98.0    CBC  Recent Labs Lab 11/16/15 1110 11/17/15 0443 11/18/15 0400  HGB 9.9* 12.1* 11.1*  HCT 31.6* 35.6* 33.0*  WBC 9.2 15.2* 15.4*  PLT 126* 141* 162    COAGULATION  Recent Labs Lab 11/15/15 0444 11/16/15 0230 11/16/15 1110 11/17/15 0443 11/18/15 0400  INR 1.38 1.26 1.69* 1.66* 1.80*    CARDIAC  Recent Labs Lab 11/16/15 1110 11/16/15 1500 11/16/15 2238 11/18/15 0400  TROPONINI 2.95* 10.64* >65.00* 50.54*   No results for input(s): PROBNP in the last 168 hours.   CHEMISTRY  Recent Labs Lab 11/13/15 2115  11/14/15 0506 11/14/15 1847 11/15/15 0444 11/16/15 1110 11/16/15 2238 11/17/15 0443 11/17/15 0856 11/18/15 0400 11/18/15 0745  NA  --   < > 136  --  134* 141 138 137  --   --  133*  K  --   < > 4.5  --  3.7 3.5 3.8  4.2  --   --  4.9  CL  --   < > 106  --  102 103 102 99*  --   --  90*  CO2  --   < > 23  --  26 18* 27 28  --   --  29  GLUCOSE  --   < > 107*  --  128* 291* 120* 98  --   --  141*  BUN  --   < > 10  --  7 10 21* 26*  --   --  44*  CREATININE 0.96  < > 0.85 1.03 0.79 1.15 2.08* 2.46*  --   --  4.55*  CALCIUM  --   < > 8.3*  --  8.4* 6.6* 7.2* 7.3*  --   --  8.2*  MG 2.6*  --  2.3 2.2  --   --   --   --  2.3 2.4  --   PHOS  --   --   --   --   --   --   --   --  2.1* 5.5*  --   < > = values in this interval not displayed. Estimated Creatinine Clearance: 23.8 mL/min (by C-G formula based on Cr of 4.55).   LIVER  Recent Labs Lab 11/15/15 0444 11/16/15 0230 11/16/15 1110 11/16/15 2238 11/17/15 0443 11/17/15 0855 11/18/15 0400  AST  --   --  213* 864*  --  762* 319*  ALT  --   --  77* 323*  --  328* 325*  ALKPHOS  --   --  314* 443*  --  399* 326*  BILITOT  --   --  0.4 0.6  --  0.7 0.6  PROT  --   --  3.3* 4.5*  --  3.5* 5.0*  ALBUMIN  --   --  1.5* 1.9*  --  2.0* 2.1*  INR 1.38 1.26 1.69*  --  1.66*  --  1.80*     INFECTIOUS  Recent Labs Lab 11/16/15 2223 11/17/15 0912 11/18/15  LATICACIDVEN 2.3* 1.1 1.7     ENDOCRINE CBG (last 3)   Recent Labs  11/18/15 0013 11/18/15 0342 11/18/15 0745  GLUCAP 106* 103* 131*      IMAGING x48h  - image(s) personally visualized  -   highlighted in bold Dg Chest Port 1 View  11/18/2015  CLINICAL DATA:  Status post minimally invasive mitral valve replacement on November 13, 2015, history of chronic CHF EXAM: PORTABLE CHEST 1 VIEW COMPARISON:  Portable chest x-ray of November 17, 2015 FINDINGS: The trachea and esophagus have been extubated. The lungs are adequately inflated. The 2 right-sided chest tube remain. There is no pneumothorax or pleural effusion. There is atelectasis in the right perihilar region which has improved. There is persistent left lower lobe atelectasis which is stable. The cardiac silhouette is enlarged. The  prosthetic mitral valve  ring is visible. The pulmonary vascularity is less prominent today. IMPRESSION: Mild interval improvement in the pulmonary interstitium consistent with resolving interstitial edema. There is persistent bibasilar atelectasis greater on the left than on the right. There is no significant pleural effusion and no pneumothorax. The remaining support tubes are in reasonable position. Electronically Signed   By: David  Martinique M.D.   On: 11/18/2015 07:42   Dg Chest Port 1 View  11/17/2015  CLINICAL DATA:  Line placement. EXAM: PORTABLE CHEST 1 VIEW COMPARISON:  11/17/2015 at 0542 hours FINDINGS: Endotracheal tube remains in place, terminating well above the carina. A right PICC has been placed and terminates over the lower SVC. Enteric tube terminates near the GE junction. Right-sided chest tubes remain in place. Sequelae of prior mitral valve replacement are again identified. Cardiac silhouette remains enlarged. Bibasilar lung opacities are unchanged with very mild central pulmonary vascular congestion again noted. No pneumothorax is identified. IMPRESSION: 1. Right PICC terminates over the lower SVC. 2. Enteric tube terminates at the GE junction.  Suggest advancement. 3. Similar appearance of cardiomegaly, mild pulmonary vascular congestion, and bibasilar atelectasis. Electronically Signed   By: Logan Bores M.D.   On: 11/17/2015 11:26   Dg Chest Port 1 View  11/17/2015  CLINICAL DATA:  Status post mitral valve replacement on November 13, 2015 EXAM: PORTABLE CHEST 1 VIEW COMPARISON:  Portable chest x-ray of November 16, 2015 FINDINGS: The lungs are reasonably well inflated. The pulmonary interstitial markings are slightly more conspicuous today. The cardiac silhouette is enlarged. The prosthetic mitral valve ring is again demonstrated. The pulmonary vascularity prominent centrally. There is no pleural effusion. The endotracheal tube tip lies 5 cm above the carina. The esophagogastric tube  tip projects below the inferior margin of the image. The 2 right-sided chest tubes are in stable position. IMPRESSION: Slight interval increase in the pulmonary interstitial markings suggests low-grade interstitial edema. Stable enlargement of the cardiac silhouette with stable to slightly increased central pulmonary vascular congestion. The support tubes are in reasonable position. Electronically Signed   By: David  Martinique M.D.   On: 11/17/2015 07:20   Dg Chest Portable 1 View  11/16/2015  CLINICAL DATA:  Endotracheal tube placement. EXAM: PORTABLE CHEST 1 VIEW COMPARISON:  Chest x-ray dated 11/15/2015. FINDINGS: Endotracheal tube has been placed in the interval and appears well positioned with tip approximately 2.5 cm above the level of the carina. Enteric tube passes below the diaphragm but with side holes just above the level of the gastroesophageal junction. Cardiomegaly is stable. Overall cardiomediastinal silhouette is unchanged in size or configuration. Two right-sided chest tubes appear stable in position. Lungs are clear. IMPRESSION: 1. Nasogastric tube tip is just below the level of the diaphragm, with proximal side holes just above the level of the gastroesophageal junction. Recommend advancement. 2. Endotracheal tube well positioned with tip just above the level of the carina. 3. Stable position of the right-sided chest tubes. 4. Stable cardiomegaly. 5. Lungs are clear, perhaps mild atelectasis at the right lung base. Electronically Signed   By: Franki Cabot M.D.   On: 11/16/2015 12:51       DISCUSSION: 49 yo male with hx OSA, HTN, mitral regurg s/p minimally invasive mitral valve replacement 12/15 with prolonged VFib arrest 12/18 and hypovolemic shock. Shock resolving, off dopamine and extubated 12/19. Tolerating HS cpap and currently on room air.    ASSESSMENT / PLAN:  PULMONARY Acute respiratory failure - post cardiac arrest  Hx OSA on CPAP Probable aspiration  Extubated  12/19 Pulmonary edema-resolving P:   CPAP at HS and supplemental O2 by [place] prn Pulmonary hygiene Incentive spirometry Continue diuresis  CARDIOVASCULAR Severe mitral regurg - s/p minimally invasive repair  Hx HTN  VFib arrest - >45 mins - unclear etiology.  ?VTE given hx DVT v primary arrhythmia post manipulation of pacer wires.   Hx DVT  Complete Heart block P:  Cardiology and EP following; see recs  TEE per cardiology later 11/18/2015 Start lasix tid 11/17/2015 UIV heparin per cardiology Cath and pacer per cardiology Continue to hold coumadin per cardiology/CVTS  RENAL Metabolic acidosis - post prolonged arrest  Lactic acidosis resolved.  Worsening AKI   P:   Check mag and phos Monitor creat esp with lasix Avoid nephrotoxic drugs  GASROINTESTINAL Shock liver  P:   PPI  Recheck LFT  HEMATOLOGIC No active issue  - on Iv heparin gtt  P:  Monitor CBC Defer anticoagulation to cardiology/CVTS   INFECTIOUS ?aspiration PNA  Leukocytosis P:   Empiric zosyn as above   ENDOCRINE DM  P:   SSI   NEUROLOGIC A AMS - post prolonged cardiac arrest   - Normal mental status 11/18/2015  P:   RASS goal: 0   Patient is stable post extubation and on CPAP at HS; no acute respiratory issues at this time. Will sign-off; recall PCCM if necessary  FAMILY  Updates:  Family at bedside. Patient and family updated on current treatment plan  Total time spent 30 minutes   Magdalene S. [facility] ANP-BC Pulmonary and Critical Care Medicine [facility] Pager: [phone]  11/18/2015 11:40 AM

## 2015-11-18 NOTE — Progress Notes (Addendum)
[address]       [place] [address]             [phone]        CARDIOTHORACIC SURGERY PROGRESS NOTE   R5 Days Post-Op Procedure(s) (LRB): TRANSESOPHAGEAL ECHOCARDIOGRAM (TEE) (N/A) MINIMALLY INVASIVE MITRAL VALVE (MV) REPLACEMENT (Right)  Subjective: Looks good and feels well.  Mild soreness in chest.  Denies SOB.  Slept fairly well.  Objective: Vital signs: BP Readings from Last 1 Encounters:  11/18/15 103/73   Pulse Readings from Last 1 Encounters:  11/18/15 80   Resp Readings from Last 1 Encounters:  11/18/15 13   Temp Readings from Last 1 Encounters:  11/18/15 97.8 F (36.6 C) Oral    Hemodynamics:    Physical Exam:  Rhythm:   Sinus with 3rd degree AV block, very slow escape - DDD pacing with Vwire threshold < 3 mA  Breath sounds: clear  Heart sounds:  RRR  Incisions:  Clean and dry  Abdomen:  Soft, non-distended, non-tender  Extremities:  Warm, well-perfused  Chest tubes:  Low volume thin serosanguinous output, no air leak    Intake/Output from previous day: 12/19 0701 - 12/20 0700 In: 1484.1 [P.O.:600; I.V.:734.1; IV Piggyback:150] Out: [id number] [Urine:1235; Chest Tube:230] Intake/Output this shift:    Lab Results:  CBC: Recent Labs  11/17/15 0443 11/18/15 0400  WBC 15.2* 15.4*  HGB 12.1* 11.1*  HCT 35.6* 33.0*  PLT 141* 162    BMET:  Recent Labs  11/16/15 2238 11/17/15 0443  NA 138 137  K 3.8 4.2  CL 102 99*  CO2 27 28  GLUCOSE 120* 98  BUN 21* 26*  CREATININE 2.08* 2.46*  CALCIUM 7.2* 7.3*     PT/INR:   Recent Labs  11/18/15 0400  LABPROT 20.9*  INR 1.80*    CBG (last 3)   Recent Labs  11/17/15 1945 11/18/15 0013 11/18/15 0342  GLUCAP 86 106* 103*    ABG    Component Value Date/Time   PHART 7.596* 11/17/2015 0814   PCO2ART 33.6* 11/17/2015 0814   PO2ART 87.0 11/17/2015 0814   HCO3 33.0* 11/17/2015 0814   TCO2 34 11/17/2015 0814   ACIDBASEDEF 2.0 11/16/2015 1333   O2SAT 98.0  11/17/2015 0814    CXR: PORTABLE CHEST 1 VIEW  COMPARISON: Portable chest x-ray of November 17, 2015  FINDINGS: The trachea and esophagus have been extubated. The lungs are adequately inflated. The 2 right-sided chest tube remain. There is no pneumothorax or pleural effusion. There is atelectasis in the right perihilar region which has improved. There is persistent left lower lobe atelectasis which is stable. The cardiac silhouette is enlarged. The prosthetic mitral valve ring is visible. The pulmonary vascularity is less prominent today.  IMPRESSION: Mild interval improvement in the pulmonary interstitium consistent with resolving interstitial edema. There is persistent bibasilar atelectasis greater on the left than on the right. There is no significant pleural effusion and no pneumothorax. The remaining support tubes are in reasonable position.   Electronically Signed  By: David Martinique M.D.  On: 11/18/2015 07:42  Assessment/Plan: S/P Procedure(s) (LRB): TRANSESOPHAGEAL ECHOCARDIOGRAM (TEE) (N/A) MINIMALLY INVASIVE MITRAL VALVE (MV) REPLACEMENT (Right)  Looks remarkably good now POD5 Stable maintaining AV paced rhythm w/ stable BP off all pressors, off amiodarone Oxygenation remarkably good and CXR clear S/P VF arrest - question primary pacer-induced arrhythmia versus acute MI - needs cath Acute renal insufficiency likely secondary to acute kidney injury, ATN related to CODE event -  no BMET done this morning - will order Mild leukocytosis likely reactive although possible aspiration at the time of CODE - continue empiric antibiotics for possible aspiration pneumonitis   D/C chest tubes   Follow up renal function  Mobilize  Await cath  Continue to hold coumadin for cath - continue IV heparin  Keep in ICU until pacer/ICD placement   [name], MD 11/18/2015 7:46 AM

## 2015-11-19 ENCOUNTER — Inpatient Hospital Stay (HOSPITAL_COMMUNITY): Payer: Managed Care, Other (non HMO)

## 2015-11-19 LAB — CBC
HCT: 32.5 % — ABNORMAL LOW (ref 39.0–52.0)
Hemoglobin: 10.6 g/dL — ABNORMAL LOW (ref 13.0–17.0)
MCH: 29 pg (ref 26.0–34.0)
MCHC: 32.6 g/dL (ref 30.0–36.0)
MCV: 88.8 fL (ref 78.0–100.0)
PLATELETS: 167 10*3/uL (ref 150–400)
RBC: 3.66 MIL/uL — ABNORMAL LOW (ref 4.22–5.81)
RDW: 13.6 % (ref 11.5–15.5)
WBC: 9.7 10*3/uL (ref 4.0–10.5)

## 2015-11-19 LAB — BASIC METABOLIC PANEL
Anion gap: 12 (ref 5–15)
BUN: 52 mg/dL — AB (ref 6–20)
CHLORIDE: 90 mmol/L — AB (ref 101–111)
CO2: 28 mmol/L (ref 22–32)
CREATININE: 5.27 mg/dL — AB (ref 0.61–1.24)
Calcium: 8.5 mg/dL — ABNORMAL LOW (ref 8.9–10.3)
GFR calc Af Amer: 13 mL/min — ABNORMAL LOW (ref >60)
GFR calc non Af Amer: 12 mL/min — ABNORMAL LOW (ref >60)
GLUCOSE: 103 mg/dL — AB (ref 65–99)
Potassium: 5.1 mmol/L (ref 3.5–5.1)
SODIUM: 130 mmol/L — AB (ref 135–145)

## 2015-11-19 LAB — GLUCOSE, CAPILLARY
GLUCOSE-CAPILLARY: 77 mg/dL (ref 65–99)
Glucose-Capillary: 75 mg/dL (ref 65–99)
Glucose-Capillary: 76 mg/dL (ref 65–99)
Glucose-Capillary: 84 mg/dL (ref 65–99)
Glucose-Capillary: 93 mg/dL (ref 65–99)

## 2015-11-19 LAB — HEPARIN LEVEL (UNFRACTIONATED)
HEPARIN UNFRACTIONATED: 0.42 [IU]/mL (ref 0.30–0.70)
Heparin Unfractionated: 0.27 IU/mL — ABNORMAL LOW (ref 0.30–0.70)

## 2015-11-19 LAB — PHOSPHORUS: Phosphorus: 6.8 mg/dL — ABNORMAL HIGH (ref 2.5–4.6)

## 2015-11-19 LAB — PROTIME-INR
INR: 1.63 — ABNORMAL HIGH (ref 0.00–1.49)
Prothrombin Time: 19.3 seconds — ABNORMAL HIGH (ref 11.6–15.2)

## 2015-11-19 LAB — MAGNESIUM: Magnesium: 2.5 mg/dL — ABNORMAL HIGH (ref 1.7–2.4)

## 2015-11-19 MED ORDER — FUROSEMIDE 10 MG/ML IJ SOLN
INTRAMUSCULAR | Status: AC
  Administered 2015-11-19: 80 mg via INTRAVENOUS
  Filled 2015-11-19: qty 8

## 2015-11-19 MED ORDER — FUROSEMIDE 10 MG/ML IJ SOLN
80.0000 mg | Freq: Once | INTRAMUSCULAR | Status: AC
  Administered 2015-11-19: 80 mg via INTRAVENOUS

## 2015-11-19 MED ORDER — WARFARIN SODIUM 5 MG PO TABS
5.0000 mg | ORAL_TABLET | Freq: Every day | ORAL | Status: DC
  Administered 2015-11-19 – 2015-11-24 (×6): 5 mg via ORAL
  Filled 2015-11-19 (×6): qty 1

## 2015-11-19 MED ORDER — WARFARIN - PHYSICIAN DOSING INPATIENT
Freq: Every day | Status: DC
  Administered 2015-11-21 – 2015-11-22 (×2): 1
  Administered 2015-11-28: 18:00:00

## 2015-11-19 NOTE — Progress Notes (Signed)
11/19/2015 2:28 PM Dr. Haroldine Laws on floor and verbal orders received for 80 mg IV Lasix x 1 and initiate CVP monitoring. Orders placed and enacted. Pt. Updated on plan of care.  Oscar Brown, Arville Lime

## 2015-11-19 NOTE — Progress Notes (Signed)
[place] note After ambulation, pt. o2 saturation decreased to 85% on room air. Placed on 2L [place] increased to 95%. Will continue to closely monitor patient.  Tra Oscar Brown, Arville Lime

## 2015-11-19 NOTE — Progress Notes (Signed)
Patient ID: [name], male   DOB: [birth year], 49 y.o.   MRN: [id number] EVENING ROUNDS NOTE :     [address]       [place] [address]             [phone]                 6 Days Post-Op Procedure(s) (LRB): TRANSESOPHAGEAL ECHOCARDIOGRAM (TEE) (N/A) MINIMALLY INVASIVE MITRAL VALVE (MV) REPLACEMENT (Right)  Total Length of Stay:  LOS: 6 days  BP 108/72 mmHg  Pulse 55  Temp(Src) 98.6 F (37 C) (Oral)  Resp 19  Ht 5\' 8"  (1.727 m)  Wt 243 lb 2.7 oz (110.3 kg)  BMI 36.98 kg/m2  SpO2 98%  .Intake/Output      12/20 0701 - 12/21 0700 12/21 0701 - 12/22 0700   P.O. 1200 600   I.V. (mL/kg) 864.4 (7.8) 366 (3.3)   IV Piggyback     Total Intake(mL/kg) 2064.4 (18.7) 966 (8.8)   Urine (mL/kg/hr) 820 (0.3) 415 (0.3)   Emesis/NG output 0 (0)    Stool 0 (0) 0 (0)   Chest Tube 50 (0)    Total Output 870 415   Net +1194.4 +551        Stool Occurrence 1 x 1 x   Emesis Occurrence 1 x      . sodium chloride 10 mL/hr at 11/19/15 1700  . sodium chloride 250 mL (11/18/15 1900)  . heparin 1,650 Units/hr (11/19/15 1751)     Lab Results  Component Value Date   WBC 9.7 11/19/2015   HGB 10.6* 11/19/2015   HCT 32.5* 11/19/2015   PLT 167 11/19/2015   GLUCOSE 103* 11/19/2015   CHOL 187 09/22/2015   TRIG 54 09/22/2015   HDL 60 09/22/2015   LDLCALC 116* 09/22/2015   ALT 325* 11/18/2015   AST 319* 11/18/2015   NA 130* 11/19/2015   K 5.1 11/19/2015   CL 90* 11/19/2015   CREATININE 5.27* 11/19/2015   BUN 52* 11/19/2015   CO2 28 11/19/2015   INR 1.63* 11/19/2015   HGBA1C 5.7* 11/11/2015   No echo report yet today Stable day For pacer/icd and cath in future   Grace Isaac MD  Beeper [phone] Office [phone] 11/19/2015 6:33 PM

## 2015-11-19 NOTE — Progress Notes (Signed)
ANTICOAGULATION CONSULT NOTE - Follow Up Consult  Pharmacy Consult for Heparin  Indication: MVR  Allergies  Allergen Reactions  . Iohexol Hives and Itching    Patient needs 13 hour prep per Dr. Pascal Lux, after cath he broke out in hives and had itching    Patient Measurements: Height: 5\' 8"  (172.7 cm) Weight: 243 lb 2.7 oz (110.3 kg) IBW/kg (Calculated) : 68.4  Vital Signs: Temp: 98.4 F (36.9 C) (12/21 0803) Temp Source: Oral (12/21 0803) BP: 109/85 mmHg (12/21 0800) Pulse Rate: 80 (12/21 0800)  Labs:  Recent Labs  11/16/15 1110 11/16/15 1500  11/16/15 2238 11/17/15 0443  11/18/15 0400  11/18/15 0745 11/18/15 1440 11/18/15 2230 11/19/15 0308 11/19/15 0758  HGB 9.9*  --   --   --  12.1*  --  11.1*  --   --   --   --  10.6*  --   HCT 31.6*  --   --   --  35.6*  --  33.0*  --   --   --   --  32.5*  --   PLT 126*  --   --   --  141*  --  162  --   --   --   --  167  --   LABPROT 19.8*  --   --   --  19.6*  --  20.9*  --   --   --   --  19.3*  --   INR 1.69*  --   --   --  1.66*  --  1.80*  --   --   --   --  1.63*  --   HEPARINUNFRC  --   --   < >  --   --   < >  --   < >  --  0.28* 0.19*  --  0.42  CREATININE 1.15  --   --  2.08* 2.46*  --   --   --  4.55*  --   --  5.27*  --   CKTOTAL 158  --   --   --   --   --   --   --   --   --   --   --   --   CKMB 7.0*  --   --   --   --   --   --   --   --   --   --   --   --   TROPONINI 2.95* 10.64*  --  >65.00*  --   --  50.54*  --   --   --   --   --   --   < > = values in this interval not displayed.  Estimated Creatinine Clearance: 20.4 mL/min (by C-G formula based on Cr of 5.27).   Assessment: 49 yom continues on IV heparin for MVR. Heparin level is slightly above low goal of 0.3-0.35. No bleeding noted. CBC is stable. INR remains therapeutic and MD is doing coumadin.   Goal of Therapy:  Heparin level 0.3-0.35 units/ml Monitor platelets by anticoagulation protocol: Yes   Plan:  - Decrease heparin slightly to 1650  units/hr - Check an 8 hour heparin level - Daily heparin level and CBC  Salome Arnt, PharmD, BCPS Pager # [phone] 11/19/2015 9:27 AM

## 2015-11-19 NOTE — Progress Notes (Signed)
SUBJECTIVE: The patient is doing well today, OOB tochair, family at bedside.  At this time, he denies chest pain or shortness of breath.  Marland Kitchen antiseptic oral rinse  7 mL Mouth Rinse BID  . aspirin EC  81 mg Oral Daily  . bisacodyl  10 mg Oral Daily   Or  . bisacodyl  10 mg Rectal Daily  . pantoprazole  40 mg Oral Daily  . sodium chloride  10-40 mL Intracatheter Q12H  . sodium chloride  3 mL Intravenous Q12H  . warfarin  5 mg Oral q1800  . Warfarin - Physician Dosing Inpatient   Does not apply q1800   . sodium chloride 10 mL/hr at 11/19/15 0800  . sodium chloride 250 mL (11/18/15 1900)  . heparin 1,700 Units/hr (11/19/15 0800)    OBJECTIVE: Physical Exam: Filed Vitals:   11/19/15 0600 11/19/15 0700 11/19/15 0800 11/19/15 0803  BP: 107/81 114/83 109/85   Pulse: 80 80 80   Temp:    98.4 F (36.9 C)  TempSrc:    Oral  Resp: 13 14 20    Height:      Weight:      SpO2: 97% 97% 96%     Intake/Output Summary (Last 24 hours) at 11/19/15 [id number] Last data filed at 11/19/15 0800  Gross per 24 hour  Intake 1799.43 ml  Output    775 ml  Net 1024.43 ml    Telemetry reveals SR, 1st degree AVblock 60-65 this AM, (pacer at VVI 56bpm), occassionally V paced   GEN- The patient is extubated, alert and oriented x 3 today Head- normocephalic, atraumatic Eyes-  Sclera clear, conjunctiva pink Ears- hearing intact Oropharynx- clear Neck- supple Lungs- Clear to ausculation bilaterally, normal work of breathing Heart- Regular rate and rhythm, mechanical S1, no significant murmurs, no rubs or gallops GI- soft, NT, ND Extremities- no clubbing, cyanosis, or edema Skin- no rash or lesion Psych- euthymic mood, full affect Neuro- no gross deficits appreciated  LABS: Basic Metabolic Panel:  Recent Labs  11/18/15 0400 11/18/15 0745 11/19/15 0308  NA  --  133* 130*  K  --  4.9 5.1  CL  --  90* 90*  CO2  --  29 28  GLUCOSE  --  141* 103*  BUN  --  44* 52*  CREATININE  --  4.55*  5.27*  CALCIUM  --  8.2* 8.5*  MG 2.4  --  2.5*  PHOS 5.5*  --  6.8*   Liver Function Tests:  Recent Labs  11/17/15 0855 11/18/15 0400  AST 762* 319*  ALT 328* 325*  ALKPHOS 399* 326*  BILITOT 0.7 0.6  PROT 3.5* 5.0*  ALBUMIN 2.0* 2.1*   No results for input(s): LIPASE, AMYLASE in the last 72 hours. CBC:  Recent Labs  11/18/15 0400 11/19/15 0308  WBC 15.4* 9.7  HGB 11.1* 10.6*  HCT 33.0* 32.5*  MCV 88.9 88.8  PLT 162 167   Cardiac Enzymes:  Recent Labs  11/16/15 1110 11/16/15 1500 11/16/15 2238 11/18/15 0400  CKTOTAL 158  --   --   --   CKMB 7.0*  --   --   --   TROPONINI 2.95* 10.64* >65.00* 50.54*    RADIOLOGY: Dg Chest Port 1 View 11/18/2015  CLINICAL DATA:  Status post minimally invasive mitral valve replacement on November 13, 2015, history of chronic CHF EXAM: PORTABLE CHEST 1 VIEW COMPARISON:  Portable chest x-ray of November 17, 2015 FINDINGS: The trachea and esophagus have been  extubated. The lungs are adequately inflated. The 2 right-sided chest tube remain. There is no pneumothorax or pleural effusion. There is atelectasis in the right perihilar region which has improved. There is persistent left lower lobe atelectasis which is stable. The cardiac silhouette is enlarged. The prosthetic mitral valve ring is visible. The pulmonary vascularity is less prominent today. IMPRESSION: Mild interval improvement in the pulmonary interstitium consistent with resolving interstitial edema. There is persistent bibasilar atelectasis greater on the left than on the right. There is no significant pleural effusion and no pneumothorax. The remaining support tubes are in reasonable position. Electronically Signed   By: David  Martinique M.D.   On: 11/18/2015 07:42    ASSESSMENT AND PLAN:  1. 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 Post-op CHB  He is in SR 1st degree AVblock this morning in the 60-65bpm range     Plan  for echo/re-eval his EF today to help guide device decision     Lovell Nuttall plan BiV device, pacing or ICD pending course, scheduled for tomorrow       3. VF arrest 11/16/15  Prolonged event with multiple shocks, CPR, multiple meds  Post arrest echo 11/16/15 with EF 20-25%, + WMA     TEE 11/17/15 EF = 35-40% with severe inferolateral HK, MITRAL VALVE: Mechanical valve. Normal function. No clot.   Mechanism suspect to be embolic, plan is to re-cath with cardiology when able.  Given renal function, this may be delayed for some time by notes from yesterday  4. Multisystem organ dysfunction, post code  extubated     Remains off pressors with stable BP  ARI, worse again today (52/5.1)  Abn/elevated LFTs, improving yesterday   5. Leukocytosis     resolved     Afebrile     Continue with primary care team   Tommye Standard, PA-C 11/19/2015 9:37 AM   I have seen and examined this patient with Tommye Standard..  Agree with above, note added to reflect my findings.  On exam, regular rhythm, no murmurs, lungs clear.  Had VF arrest post surgery with prolonged resuscitation.  Was in complete heart block but now with conduction.  Has TTE pending at this time.  Likely Aneshia Jacquet need a device but Lanique Gonzalo make that decision based on the results of the TTE.    Virlan Kempker M. Najla Aughenbaugh MD 11/19/2015 3:58 PM

## 2015-11-19 NOTE — Progress Notes (Signed)
Placed patient on home CPAP for the night  

## 2015-11-19 NOTE — Progress Notes (Signed)
ANTICOAGULATION CONSULT NOTE - Follow Up Consult  Pharmacy Consult for Heparin  Indication: MVR  Allergies  Allergen Reactions  . Iohexol Hives and Itching    Patient needs 13 hour prep per Dr. Pascal Lux, after cath he broke out in hives and had itching    Patient Measurements: Height: 5\' 8"  (172.7 cm) Weight: 243 lb 2.7 oz (110.3 kg) IBW/kg (Calculated) : 68.4  Vital Signs: Temp: 98.6 F (37 C) (12/21 1551) Temp Source: Oral (12/21 1551) BP: 108/72 mmHg (12/21 1800) Pulse Rate: 55 (12/21 1800)  Labs:  Recent Labs  11/16/15 2238  11/17/15 0443  11/18/15 0400  11/18/15 0745  11/18/15 2230 11/19/15 0308 11/19/15 0758 11/19/15 1827  HGB  --   < > 12.1*  --  11.1*  --   --   --   --  10.6*  --   --   HCT  --   --  35.6*  --  33.0*  --   --   --   --  32.5*  --   --   PLT  --   --  141*  --  162  --   --   --   --  167  --   --   LABPROT  --   --  19.6*  --  20.9*  --   --   --   --  19.3*  --   --   INR  --   --  1.66*  --  1.80*  --   --   --   --  1.63*  --   --   HEPARINUNFRC  --   --   --   < >  --   < >  --   < > 0.19*  --  0.42 0.27*  CREATININE 2.08*  --  2.46*  --   --   --  4.55*  --   --  5.27*  --   --   TROPONINI >65.00*  --   --   --  50.54*  --   --   --   --   --   --   --   < > = values in this interval not displayed.  Estimated Creatinine Clearance: 20.4 mL/min (by C-G formula based on Cr of 5.27).  Assessment: 66 yom continues on IV heparin for MVR. Heparin level is slightly below goal of 0.3-0.35. No bleeding noted. CBC is stable. INR remains therapeutic and MD is dosing coumadin.   Goal of Therapy:  Heparin level 0.3-0.35 units/ml Monitor platelets by anticoagulation protocol: Yes   Plan:  - Increase heparin slightly to 1700 units/hr - Daily heparin level and CBC  Rober Minion, PharmD., MS Clinical Pharmacist Pager:  [phone] Thank you for allowing pharmacy to be part of this patients care team.  11/19/2015 7:07 PM

## 2015-11-19 NOTE — Progress Notes (Signed)
[address]       [place] [address]             [phone]        CARDIOTHORACIC SURGERY PROGRESS NOTE   R6 Days Post-Op Procedure(s) (LRB): TRANSESOPHAGEAL ECHOCARDIOGRAM (TEE) (N/A) MINIMALLY INVASIVE MITRAL VALVE (MV) REPLACEMENT (Right)  Subjective: Looks and feels well.  One episode emesis last night.  Feels hungry.  No nausea at present.  No abdominal pain.  No SOB.  + productive cough but improving.  Objective: Vital signs: BP Readings from Last 1 Encounters:  11/19/15 109/85   Pulse Readings from Last 1 Encounters:  11/19/15 80   Resp Readings from Last 1 Encounters:  11/19/15 20   Temp Readings from Last 1 Encounters:  11/19/15 98.4 F (36.9 C) Oral    Hemodynamics:    Physical Exam:  Rhythm:   Sinus w/ 1st degree AV block this morning - HR 60  Breath sounds: clear  Heart sounds:  RRR  Incisions:  Clean and dry  Abdomen:  Soft, non-distended, non-tender  Extremities:  Warm, well-perfused    Intake/Output from previous day: 12/20 0701 - 12/21 0700 In: 2064.4 [P.O.:1200; I.V.:864.4] Out: 870 [Urine:820; Chest Tube:50] Intake/Output this shift: Total I/O In: 37 [I.V.:37] Out: 30 [Urine:30]  Lab Results:  CBC: Recent Labs  11/18/15 0400 11/19/15 0308  WBC 15.4* 9.7  HGB 11.1* 10.6*  HCT 33.0* 32.5*  PLT 162 167    BMET:  Recent Labs  11/18/15 0745 11/19/15 0308  NA 133* 130*  K 4.9 5.1  CL 90* 90*  CO2 29 28  GLUCOSE 141* 103*  BUN 44* 52*  CREATININE 4.55* 5.27*  CALCIUM 8.2* 8.5*     PT/INR:   Recent Labs  11/19/15 0308  LABPROT 19.3*  INR 1.63*    CBG (last 3)   Recent Labs  11/18/15 2343 11/19/15 0353 11/19/15 0756  GLUCAP 84 77 93    ABG    Component Value Date/Time   PHART 7.596* 11/17/2015 0814   PCO2ART 33.6* 11/17/2015 0814   PO2ART 87.0 11/17/2015 0814   HCO3 33.0* 11/17/2015 0814   TCO2 34 11/17/2015 0814   ACIDBASEDEF 2.0 11/16/2015 1333   O2SAT 98.0 11/17/2015 0814      CXR: PORTABLE CHEST 1 VIEW  COMPARISON: Portable chest x-ray of November 18, 2015.  FINDINGS: The left lung is well-expanded. Minimal basilar atelectasis persists. On the right subsegmental atelectasis persists lateral to the heart border. The right-sided chest tubes have been removed. There is no significant pleural effusion and there is no pneumothorax. There is stable cardiomegaly without pulmonary vascular congestion. The mitral valve ring is visible. The PICC line tip on the right projects over the midportion of the SVC.  IMPRESSION: 1. Persistent atelectasis at both lung bases. Stable cardiomegaly without pulmonary edema. 2. Interval removal of the 2 right-sided chest tubes without evidence of pneumothorax or pleural effusion.   Electronically Signed  By: David Martinique M.D.  On: 11/19/2015 07:41  Assessment/Plan: S/P Procedure(s) (LRB): TRANSESOPHAGEAL ECHOCARDIOGRAM (TEE) (N/A) MINIMALLY INVASIVE MITRAL VALVE (MV) REPLACEMENT (Right)  Looks remarkably good now POD6 Now back in sinus rhythm, mild 1st degree AV block Oxygenation remarkably good and CXR clear S/P VF arrest - question primary pacer-induced arrhythmia versus acute MI - needs cath at some point Acute renal insufficiency likely secondary to acute kidney injury, ATN related to CODE event - creatinine up to 5.3 but making adequate urine Mild leukocytosis resolved  Probably still needs pacer-ICD but return of rhythm encouraging  Watch renal function closely  Hold on plans for cath due to renal dysfunction and restart coumadin  Continue IV heparin until INR 2.0  Stop antibiotics - no signs of infection/pneumonia  Keep in ICU   Mobilize  [name], MD 11/19/2015 9:10 AM

## 2015-11-19 NOTE — Progress Notes (Signed)
Subjective:   Feels good. Sitting up in chair.  No SOB. +bloating and edema. Making urine. Rhythm now NSR with 1HB.   Creatinine up to 5.2. Weight up 20 pounds from pre-code weight.   TEE with EF 35-40% no vegetation on valve or dissection.      Intake/Output Summary (Last 24 hours) at 11/19/15 1500 Last data filed at 11/19/15 1300  Gross per 24 hour  Intake 1862.93 ml  Output    555 ml  Net 1307.93 ml    Current meds: . antiseptic oral rinse  7 mL Mouth Rinse BID  . aspirin EC  81 mg Oral Daily  . bisacodyl  10 mg Oral Daily   Or  . bisacodyl  10 mg Rectal Daily  . pantoprazole  40 mg Oral Daily  . sodium chloride  10-40 mL Intracatheter Q12H  . sodium chloride  3 mL Intravenous Q12H  . warfarin  5 mg Oral q1800  . Warfarin - Physician Dosing Inpatient   Does not apply q1800   Infusions: . sodium chloride 10 mL/hr at 11/19/15 1200  . sodium chloride 250 mL (11/18/15 1900)  . heparin 1,650 Units/hr (11/19/15 1300)     Objective:  Blood pressure 127/85, pulse 63, temperature 98.2 F (36.8 C), temperature source Oral, resp. rate 12, height 5\' 8"  (1.727 m), weight 243 lb 2.7 oz (110.3 kg), SpO2 99 %. Weight change: -3 lb 12 oz (-1.7 kg)  Physical Exam: General:  Sitting in chair HEENT: normal Neck: supple. JVP to jaw Carotids 2+ bilat; no bruits. No lymphadenopathy or thryomegaly appreciated. Cor: PMI nondisplaced. Regular rate & rhythm. Mechanical s1 Lungs: clear Abdomen: soft, nontender, + distended. No hepatosplenomegaly. No bruits or masses. Good bowel sounds. Extremities: no cyanosis, clubbing, rash, 1-2+ edema L>> R Neuro: alert and oriented x 3 cranial nerves grossly intact. moves all 4 extremities w/o difficulty. Affect pleasant  Telemetry: AV paced CHB underneath  Lab Results: Basic Metabolic Panel:  Recent Labs Lab 11/14/15 0506 11/14/15 1847  11/16/15 1110 11/16/15 2238 11/17/15 0443 11/17/15 0856 11/18/15 0400 11/18/15 0745  11/19/15 0308  NA 136  --   < > 141 138 137  --   --  133* 130*  K 4.5  --   < > 3.5 3.8 4.2  --   --  4.9 5.1  CL 106  --   < > 103 102 99*  --   --  90* 90*  CO2 23  --   < > 18* 27 28  --   --  29 28  GLUCOSE 107*  --   < > 291* 120* 98  --   --  141* 103*  BUN 10  --   < > 10 21* 26*  --   --  44* 52*  CREATININE 0.85 1.03  < > 1.15 2.08* 2.46*  --   --  4.55* 5.27*  CALCIUM 8.3*  --   < > 6.6* 7.2* 7.3*  --   --  8.2* 8.5*  MG 2.3 2.2  --   --   --   --  2.3 2.4  --  2.5*  PHOS  --   --   --   --   --   --  2.1* 5.5*  --  6.8*  < > = values in this interval not displayed. Liver Function Tests:  Recent Labs Lab 11/16/15 1110 11/16/15 2238 11/17/15 0855 11/18/15 0400  AST 213* 864* 762* 319*  ALT  77* 323* 328* 325*  ALKPHOS 314* 443* 399* 326*  BILITOT 0.4 0.6 0.7 0.6  PROT 3.3* 4.5* 3.5* 5.0*  ALBUMIN 1.5* 1.9* 2.0* 2.1*   No results for input(s): LIPASE, AMYLASE in the last 168 hours. No results for input(s): AMMONIA in the last 168 hours. CBC:  Recent Labs Lab 11/15/15 0444 11/16/15 1110 11/17/15 0443 11/18/15 0400 11/19/15 0308  WBC 11.2* 9.2 15.2* 15.4* 9.7  HGB 11.6* 9.9* 12.1* 11.1* 10.6*  HCT 35.4* 31.6* 35.6* 33.0* 32.5*  MCV 89.6 93.2 86.6 88.9 88.8  PLT 132* 126* 141* 162 167   Cardiac Enzymes:  Recent Labs Lab 11/16/15 1110 11/16/15 1500 11/16/15 2238 11/18/15 0400  CKTOTAL 158  --   --   --   CKMB 7.0*  --   --   --   TROPONINI 2.95* 10.64* >65.00* 50.54*   BNP: Invalid input(s): POCBNP CBG:  Recent Labs Lab 11/18/15 1912 11/18/15 2343 11/19/15 0353 11/19/15 0756 11/19/15 1210  GLUCAP 100* 84 77 93 76   Microbiology: Lab Results  Component Value Date   CULT NO GROWTH 5 DAYS 05/29/2015   CULT NO GROWTH 5 DAYS 05/29/2015   No results for input(s): CULT, SDES in the last 168 hours.  Imaging: Dg Chest Port 1 View  11/19/2015  CLINICAL DATA:  Acute respiratory failure, chronic CHF with mitral regurgitation and mitral valve  replacement on 13 November 2015. EXAM: PORTABLE CHEST 1 VIEW COMPARISON:  Portable chest x-ray of November 18, 2015. FINDINGS: The left lung is well-expanded. Minimal basilar atelectasis persists. On the right subsegmental atelectasis persists lateral to the heart border. The right-sided chest tubes have been removed. There is no significant pleural effusion and there is no pneumothorax. There is stable cardiomegaly without pulmonary vascular congestion. The mitral valve ring is visible. The PICC line tip on the right projects over the midportion of the SVC. IMPRESSION: 1. Persistent atelectasis at both lung bases. Stable cardiomegaly without pulmonary edema. 2. Interval removal of the 2 right-sided chest tubes without evidence of pneumothorax or pleural effusion. Electronically Signed   By: David  Martinique M.D.   On: 11/19/2015 07:41   Dg Chest Port 1 View  11/18/2015  CLINICAL DATA:  Status post minimally invasive mitral valve replacement on November 13, 2015, history of chronic CHF EXAM: PORTABLE CHEST 1 VIEW COMPARISON:  Portable chest x-ray of November 17, 2015 FINDINGS: The trachea and esophagus have been extubated. The lungs are adequately inflated. The 2 right-sided chest tube remain. There is no pneumothorax or pleural effusion. There is atelectasis in the right perihilar region which has improved. There is persistent left lower lobe atelectasis which is stable. The cardiac silhouette is enlarged. The prosthetic mitral valve ring is visible. The pulmonary vascularity is less prominent today. IMPRESSION: Mild interval improvement in the pulmonary interstitium consistent with resolving interstitial edema. There is persistent bibasilar atelectasis greater on the left than on the right. There is no significant pleural effusion and no pneumothorax. The remaining support tubes are in reasonable position. Electronically Signed   By: David  Martinique M.D.   On: 11/18/2015 07:42     ASSESSMENT:  1. Cardiac  arrest VT/VF - 11/16/15 2. 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 Cardiogenic shock s/p cardiac arrest 4. Probable ischemic cardiomyopathy EF 25-30% 5. AKI 6. Shock liver 7. Acute respiratory failure due to #1 8. CHB  PLAN/DISCUSSION:  Clinical data supports that he likely had embolus down RCA resulting in VF arrest. Now stabilized.  Continues to improve clinically though creatinine still on the rise due to ATN. Hopefully will plateau soon. Will continue supportive care. Give 80 lasix today for volume overload. Will follow CVPs.   Rhythm now NSR with 1AVB. No need for PPM currently. D/w EP.   Continue heparin. As we are deferring cath due to AKI will start coumadin back now. Would continue abx for at least 7 days. Plan cath as outpatient     LOS: 6 days    Glori Bickers, MD 11/19/2015, 3:00 PM

## 2015-11-19 NOTE — Progress Notes (Signed)
SUBJECTIVE: The patient is doing well today, OOB tochair, family at bedside.  At this time, he denies chest pain or shortness of breath.  Marland Kitchen antiseptic oral rinse  7 mL Mouth Rinse BID  . aspirin EC  81 mg Oral Daily  . bisacodyl  10 mg Oral Daily   Or  . bisacodyl  10 mg Rectal Daily  . pantoprazole  40 mg Oral Daily  . sodium chloride  10-40 mL Intracatheter Q12H  . sodium chloride  3 mL Intravenous Q12H  . warfarin  5 mg Oral q1800  . Warfarin - Physician Dosing Inpatient   Does not apply q1800   . sodium chloride 10 mL/hr at 11/19/15 1500  . sodium chloride 250 mL (11/18/15 1900)  . heparin 1,650 Units/hr (11/19/15 1500)    OBJECTIVE: Physical Exam: Filed Vitals:   11/19/15 1300 11/19/15 1400 11/19/15 1500 11/19/15 1551  BP: 127/85 109/80 116/82   Pulse: 63 56 61   Temp:    98.6 F (37 C)  TempSrc:    Oral  Resp: 12 18 19    Height:      Weight:      SpO2: 99% 92% 100%     Intake/Output Summary (Last 24 hours) at 11/19/15 1600 Last data filed at 11/19/15 1500  Gross per 24 hour  Intake 2159.85 ml  Output    685 ml  Net 1474.85 ml    Telemetry reveals SR, 1st degree AVblock 60-65 this AM, (pacer at VVI 56bpm), occassionally V paced   GEN- The patient is extubated, alert and oriented x 3 today Head- normocephalic, atraumatic Eyes-  Sclera clear, conjunctiva pink Ears- hearing intact Oropharynx- clear Neck- supple Lungs- Clear to ausculation bilaterally, normal work of breathing Heart- Regular rate and rhythm, mechanical S1, no significant murmurs, no rubs or gallops GI- soft, NT, ND Extremities- no clubbing, cyanosis, or edema Skin- no rash or lesion Psych- euthymic mood, full affect Neuro- no gross deficits appreciated  LABS: Basic Metabolic Panel:  Recent Labs  11/18/15 0400 11/18/15 0745 11/19/15 0308  NA  --  133* 130*  K  --  4.9 5.1  CL  --  90* 90*  CO2  --  29 28  GLUCOSE  --  141* 103*  BUN  --  44* 52*  CREATININE  --  4.55*  5.27*  CALCIUM  --  8.2* 8.5*  MG 2.4  --  2.5*  PHOS 5.5*  --  6.8*   Liver Function Tests:  Recent Labs  11/17/15 0855 11/18/15 0400  AST 762* 319*  ALT 328* 325*  ALKPHOS 399* 326*  BILITOT 0.7 0.6  PROT 3.5* 5.0*  ALBUMIN 2.0* 2.1*   No results for input(s): LIPASE, AMYLASE in the last 72 hours. CBC:  Recent Labs  11/18/15 0400 11/19/15 0308  WBC 15.4* 9.7  HGB 11.1* 10.6*  HCT 33.0* 32.5*  MCV 88.9 88.8  PLT 162 167   Cardiac Enzymes:  Recent Labs  11/16/15 2238 11/18/15 0400  TROPONINI >65.00* 50.54*    RADIOLOGY: Dg Chest Port 1 View 11/18/2015  CLINICAL DATA:  Status post minimally invasive mitral valve replacement on November 13, 2015, history of chronic CHF EXAM: PORTABLE CHEST 1 VIEW COMPARISON:  Portable chest x-ray of November 17, 2015 FINDINGS: The trachea and esophagus have been extubated. The lungs are adequately inflated. The 2 right-sided chest tube remain. There is no pneumothorax or pleural effusion. There is atelectasis in the right perihilar region which has improved.  There is persistent left lower lobe atelectasis which is stable. The cardiac silhouette is enlarged. The prosthetic mitral valve ring is visible. The pulmonary vascularity is less prominent today. IMPRESSION: Mild interval improvement in the pulmonary interstitium consistent with resolving interstitial edema. There is persistent bibasilar atelectasis greater on the left than on the right. There is no significant pleural effusion and no pneumothorax. The remaining support tubes are in reasonable position. Electronically Signed   By: David  Martinique M.D.   On: 11/18/2015 07:42    ASSESSMENT AND PLAN:  1. 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 Post-op CHB  He is in SR 1st degree AVblock this morning in the 60-65bpm range     Plan for echo/re-eval his EF today to help guide device decision     Bronnie Vasseur plan BiV device, pacing or ICD  pending course, scheduled for tomorrow       3. VF arrest 11/16/15  Prolonged event with multiple shocks, CPR, multiple meds  Post arrest echo 11/16/15 with EF 20-25%, + WMA     TEE 11/17/15 EF = 35-40% with severe inferolateral HK, MITRAL VALVE: Mechanical valve. Normal function. No clot.   Mechanism suspect to be embolic, plan is to re-cath with cardiology when able.  Given renal function, this may be delayed for some time by notes from yesterday  4. Multisystem organ dysfunction, post code  extubated     Remains off pressors with stable BP  ARI, worse again today (52/5.1)  Abn/elevated LFTs, improving yesterday   5. Leukocytosis     resolved     Afebrile     Continue with primary care team   Tommye Standard, PA-C 11/19/2015 4:00 PM   I have seen and examined this patient with Tommye Standard..  Agree with above, note added to reflect my findings.  On exam, regular rhythm, no murmurs, lungs clear.  Had VF arrest post surgery with prolonged resuscitation.  Was in complete heart block but now with conduction.  Has TTE pending at this time.  Rahcel Shutes possibly need a device but Keerthana Vanrossum make that decision based on the results of the TTE.    Ala Capri M. Kary Sugrue MD 11/19/2015 4:00 PM

## 2015-11-20 ENCOUNTER — Encounter (HOSPITAL_COMMUNITY): Payer: Self-pay | Admitting: Thoracic Surgery (Cardiothoracic Vascular Surgery)

## 2015-11-20 ENCOUNTER — Inpatient Hospital Stay (HOSPITAL_COMMUNITY): Payer: Managed Care, Other (non HMO)

## 2015-11-20 ENCOUNTER — Encounter (HOSPITAL_COMMUNITY)
Admission: RE | Disposition: A | Discharge status: Home / Self Care | Payer: Self-pay | Source: Ambulatory Visit | Attending: Thoracic Surgery (Cardiothoracic Vascular Surgery)

## 2015-11-20 DIAGNOSIS — I5032 Chronic diastolic (congestive) heart failure: Secondary | ICD-10-CM

## 2015-11-20 DIAGNOSIS — N179 Acute kidney failure, unspecified: Secondary | ICD-10-CM | POA: Diagnosis not present

## 2015-11-20 DIAGNOSIS — I5021 Acute systolic (congestive) heart failure: Secondary | ICD-10-CM

## 2015-11-20 DIAGNOSIS — I4901 Ventricular fibrillation: Secondary | ICD-10-CM

## 2015-11-20 DIAGNOSIS — I442 Atrioventricular block, complete: Secondary | ICD-10-CM

## 2015-11-20 LAB — PROTIME-INR
INR: 1.7 — AB (ref 0.00–1.49)
Prothrombin Time: 19.9 seconds — ABNORMAL HIGH (ref 11.6–15.2)

## 2015-11-20 LAB — BASIC METABOLIC PANEL
Anion gap: 13 (ref 5–15)
BUN: 62 mg/dL — AB (ref 6–20)
CHLORIDE: 90 mmol/L — AB (ref 101–111)
CO2: 26 mmol/L (ref 22–32)
Calcium: 8.4 mg/dL — ABNORMAL LOW (ref 8.9–10.3)
Creatinine, Ser: 6.52 mg/dL — ABNORMAL HIGH (ref 0.61–1.24)
GFR calc Af Amer: 10 mL/min — ABNORMAL LOW (ref >60)
GFR calc non Af Amer: 9 mL/min — ABNORMAL LOW (ref >60)
GLUCOSE: 87 mg/dL (ref 65–99)
POTASSIUM: 4.8 mmol/L (ref 3.5–5.1)
Sodium: 129 mmol/L — ABNORMAL LOW (ref 135–145)

## 2015-11-20 LAB — CBC
HEMATOCRIT: 30.7 % — AB (ref 39.0–52.0)
HEMOGLOBIN: 10.4 g/dL — AB (ref 13.0–17.0)
MCH: 29.8 pg (ref 26.0–34.0)
MCHC: 33.9 g/dL (ref 30.0–36.0)
MCV: 88 fL (ref 78.0–100.0)
Platelets: 172 10*3/uL (ref 150–400)
RBC: 3.49 MIL/uL — ABNORMAL LOW (ref 4.22–5.81)
RDW: 13.5 % (ref 11.5–15.5)
WBC: 6.7 10*3/uL (ref 4.0–10.5)

## 2015-11-20 LAB — PHOSPHORUS: Phosphorus: 7.3 mg/dL — ABNORMAL HIGH (ref 2.5–4.6)

## 2015-11-20 LAB — CARBOXYHEMOGLOBIN
CARBOXYHEMOGLOBIN: 1.4 % (ref 0.5–1.5)
METHEMOGLOBIN: 0.7 % (ref 0.0–1.5)
O2 Saturation: 55.1 %
Total hemoglobin: 10.8 g/dL — ABNORMAL LOW (ref 13.5–18.0)

## 2015-11-20 LAB — GLUCOSE, CAPILLARY
GLUCOSE-CAPILLARY: 74 mg/dL (ref 65–99)
GLUCOSE-CAPILLARY: 86 mg/dL (ref 65–99)
Glucose-Capillary: 99 mg/dL (ref 65–99)

## 2015-11-20 LAB — HEPARIN LEVEL (UNFRACTIONATED): HEPARIN UNFRACTIONATED: 0.32 [IU]/mL (ref 0.30–0.70)

## 2015-11-20 LAB — MAGNESIUM: Magnesium: 2.5 mg/dL — ABNORMAL HIGH (ref 1.7–2.4)

## 2015-11-20 SURGERY — BIV ICD INSERTION CRT-D
Anesthesia: LOCAL

## 2015-11-20 MED ORDER — METOLAZONE 5 MG PO TABS
5.0000 mg | ORAL_TABLET | Freq: Every day | ORAL | Status: DC
  Administered 2015-11-20 – 2015-11-21 (×2): 5 mg via ORAL
  Filled 2015-11-20 (×2): qty 1

## 2015-11-20 MED ORDER — FUROSEMIDE 10 MG/ML IJ SOLN
120.0000 mg | Freq: Three times a day (TID) | INTRAVENOUS | Status: DC
  Administered 2015-11-20 – 2015-11-21 (×4): 120 mg via INTRAVENOUS
  Filled 2015-11-20 (×6): qty 12

## 2015-11-20 MED ORDER — FUROSEMIDE 10 MG/ML IJ SOLN: 80.0000 mg | Freq: Once | INTRAMUSCULAR | Status: DC

## 2015-11-20 MED ORDER — FUROSEMIDE 10 MG/ML IJ SOLN
120.0000 mg | Freq: Three times a day (TID) | INTRAMUSCULAR | Status: DC
  Filled 2015-11-20 (×3): qty 12

## 2015-11-20 MED ORDER — FUROSEMIDE 10 MG/ML IJ SOLN
160.0000 mg | Freq: Once | INTRAVENOUS | Status: DC
  Filled 2015-11-20: qty 16

## 2015-11-20 MED ORDER — FUROSEMIDE 10 MG/ML IJ SOLN
120.0000 mg | Freq: Once | INTRAVENOUS | Status: AC
  Administered 2015-11-20: 120 mg via INTRAVENOUS
  Filled 2015-11-20: qty 12

## 2015-11-20 NOTE — Progress Notes (Signed)
[facility] BRIEF SICU PROGRESS NOTE  7 Days Post-Op  S/P Procedure(s) (LRB): TRANSESOPHAGEAL ECHOCARDIOGRAM (TEE) (N/A) MINIMALLY INVASIVE MITRAL VALVE (MV) REPLACEMENT (Right)   Had a very good day Feels better Ambulated 8 times around ICU Maintaining NSR w/ 1st degree AV block and stable BP UOP increased following lasix Follow up ECHO results very encouraging - EF reportedly 55-60%  Plan: Continue current plan  [name], MD 11/20/2015 7:44 PM

## 2015-11-20 NOTE — Procedures (Signed)
Pt has home CPAP and will place on self when ready.  Pt instructed to notify RT if additional assistance is needed.

## 2015-11-20 NOTE — Progress Notes (Signed)
ANTICOAGULATION CONSULT NOTE - Follow Up Consult  Pharmacy Consult for Heparin  Indication: MVR  Allergies  Allergen Reactions  . Iohexol Hives and Itching    Patient needs 13 hour prep per Dr. Pascal Lux, after cath he broke out in hives and had itching    Patient Measurements: Height: 5\' 8"  (172.7 cm) Weight: 251 lb 1.7 oz (113.9 kg) IBW/kg (Calculated) : 68.4  Vital Signs: Temp: 98 F (36.7 C) (12/22 0800) Temp Source: Oral (12/22 0800) BP: 112/80 mmHg (12/22 0800) Pulse Rate: 64 (12/22 0800)  Labs:  Recent Labs  11/18/15 0400  11/18/15 0745  11/19/15 0308 11/19/15 0758 11/19/15 1827 11/20/15 0716  HGB 11.1*  --   --   --  10.6*  --   --  10.4*  HCT 33.0*  --   --   --  32.5*  --   --  30.7*  PLT 162  --   --   --  167  --   --  172  LABPROT 20.9*  --   --   --  19.3*  --   --  19.9*  INR 1.80*  --   --   --  1.63*  --   --  1.70*  HEPARINUNFRC  --   < >  --   < >  --  0.42 0.27* 0.32  CREATININE  --   --  4.55*  --  5.27*  --   --  6.52*  TROPONINI 50.54*  --   --   --   --   --   --   --   < > = values in this interval not displayed.  Estimated Creatinine Clearance: 16.8 mL/min (by C-G formula based on Cr of 6.52).  Assessment: 50 yom continues on IV heparin for MVR. Heparin level is at goal at 0.32 with narrow goal of 0.3-0.35 per MD. No bleeding noted. CBC is stable. INR remains subtherapeutic and MD is dosing coumadin.   Goal of Therapy:  Heparin level 0.3-0.35 units/ml Monitor platelets by anticoagulation protocol: Yes   Plan:  - Continue heparin gtt at 1700 units/hr - Daily heparin level and CBC  Salome Arnt, PharmD, BCPS Pager # [phone] 11/20/2015 9:29 AM

## 2015-11-20 NOTE — Progress Notes (Addendum)
11/20/2015 8:27 AM Nursing note Dr. Roxy Manns paged and made aware of Creatinine result from this morning along with BUN. Verbal orders received to change ordered Lasix to 160 mg IV x 1. Orders placed and enacted.  Rajeev Escue, Arville Lime

## 2015-11-20 NOTE — Progress Notes (Addendum)
      [address]       [place] [address]             [phone]        CARDIOTHORACIC SURGERY PROGRESS NOTE   R7 Days Post-Op Procedure(s) (LRB): TRANSESOPHAGEAL ECHOCARDIOGRAM (TEE) (N/A) MINIMALLY INVASIVE MITRAL VALVE (MV) REPLACEMENT (Right)  Subjective: Looks good and feels well.  Minimal soreness right chest.  Just ambulated around SICU.  Denies cough, SOB.  Objective: Vital signs: BP Readings from Last 1 Encounters:  11/20/15 109/82   Pulse Readings from Last 1 Encounters:  11/20/15 60   Resp Readings from Last 1 Encounters:  11/20/15 15   Temp Readings from Last 1 Encounters:  11/20/15 97.9 F (36.6 C) Oral    Hemodynamics: CVP:  [8 mmHg-16 mmHg] 8 mmHg  Physical Exam:  Rhythm:   Sinus w/ long 1st degree AV block  Breath sounds: clear  Heart sounds:  RRR  Incisions:  Clean and dry  Abdomen:  Soft, non-distended, non-tender  Extremities:  Warm, well-perfused   Intake/Output from previous day: 12/21 0701 - 12/22 0700 In: 1462.6 [P.O.:600; I.V.:862.6] Out: 955 [Urine:955] Intake/Output this shift: Total I/O In: 37 [I.V.:37] Out: 15 [Urine:15]  Lab Results:  CBC: Recent Labs  11/19/15 0308 11/20/15 0716  WBC 9.7 6.7  HGB 10.6* 10.4*  HCT 32.5* 30.7*  PLT 167 172    BMET:  Recent Labs  11/18/15 0745 11/19/15 0308  NA 133* 130*  K 4.9 5.1  CL 90* 90*  CO2 29 28  GLUCOSE 141* 103*  BUN 44* 52*  CREATININE 4.55* 5.27*  CALCIUM 8.2* 8.5*     PT/INR:   Recent Labs  11/20/15 0716  LABPROT 19.9*  INR 1.70*    CBG (last 3)   Recent Labs  11/19/15 0756 11/19/15 1210 11/19/15 1549  GLUCAP 93 76 75    ABG    Component Value Date/Time   PHART 7.596* 11/17/2015 0814   PCO2ART 33.6* 11/17/2015 0814   PO2ART 87.0 11/17/2015 0814   HCO3 33.0* 11/17/2015 0814   TCO2 34 11/17/2015 0814   ACIDBASEDEF 2.0 11/16/2015 1333   O2SAT 98.0 11/17/2015 0814    CXR: n/a  Assessment/Plan: S/P Procedure(s)  (LRB): TRANSESOPHAGEAL ECHOCARDIOGRAM (TEE) (N/A) MINIMALLY INVASIVE MITRAL VALVE (MV) REPLACEMENT (Right)  Looks remarkably good now POD7 Now maintaining sinus rhythm with long 1st degree AV block - some reported bradycardia overnight Oxygenation remarkably good and CXR clear S/P VF arrest - question acute inferior MI - needs cath at some point Acute renal failure, non-oliguric - likely secondary to acute kidney injury, ATN related to CODE event - BMET panel not done this morning Acute on chronic diastolic CHF with expected post-op volume excess, weight up 15 kg since CODE event but respiratory status stable Mild leukocytosis resolved   Probably still needs pacer-ICD but return of rhythm encouraging - f/u ECHO not done yesterday  Watch renal function closely  Hold on plans for cath due to renal dysfunction and continue coumadin  Try another dose IV lasix to stimulate UOP  Continue IV heparin until INR 2.0  Keep in ICU   Mobilize  [name], MD 11/20/2015 8:11 AM

## 2015-11-20 NOTE — Progress Notes (Signed)
Physical Therapy Treatment Patient Details Name: CAMIEL GREGER MRN: [id number] DOB: Jul 29, 1966 Today's Date: 11/20/2015    History of Present Illness pt is a 49 y/o male with mitral regurg., lymphedema, OSA, CHF, s/p minimally invasive MVR.  Cardiac arrest 12/18 due to heart block, intubated, 12/19 extubated.    PT Comments    Progressing well.  Emphasizing gait speed and balance challenges   Follow Up Recommendations  No PT follow up;Supervision for mobility/OOB     Equipment Recommendations  None recommended by PT    Recommendations for Other Services       Precautions / Restrictions Precautions Precautions: Other (comment)    Mobility  Bed Mobility                  Transfers Overall transfer level: Needs assistance Equipment used: None Transfers: Sit to/from Stand Sit to Stand: Supervision         General transfer comment: using no hands, follows sternal precautions without cues  Ambulation/Gait Ambulation/Gait assistance: Supervision Ambulation Distance (Feet): 1000 Feet Assistive device: None Gait Pattern/deviations: Step-through pattern;Decreased stride length Gait velocity: unable to speed up appreciably Gait velocity interpretation: Below normal speed for age/gender General Gait Details: steady, but a little stiff, 1 episode of instability with scanning.  pt self recovered.   Stairs            Wheelchair Mobility    Modified Rankin (Stroke Patients Only)       Balance     Sitting balance-Leahy Scale: Good       Standing balance-Leahy Scale: Fair                      Cognition Arousal/Alertness: Awake/alert Behavior During Therapy: WFL for tasks assessed/performed Overall Cognitive Status: Within Functional Limits for tasks assessed                      Exercises      General Comments General comments (skin integrity, edema, etc.): Sats with gait in upper 90's, HR in the 80's      Pertinent  Vitals/Pain Pain Assessment: No/denies pain    Home Living                      Prior Function            PT Goals (current goals can now be found in the care plan section) Acute Rehab PT Goals Patient Stated Goal: home independent, back to work PT Goal Formulation: With patient Time For Goal Achievement: 12/02/15 Potential to Achieve Goals: Good Progress towards PT goals: Progressing toward goals    Frequency  Min 3X/week    PT Plan      Co-evaluation             End of Session   Activity Tolerance: Patient tolerated treatment well Patient left: in chair;with call bell/phone within reach;with family/visitor present     Time: [id number] PT Time Calculation (min) (ACUTE ONLY): 20 min  Charges:  $Gait Training: 8-22 mins                    G Codes:      Elvia Aydin, [name] 11/20/2015, 5:22 PM

## 2015-11-20 NOTE — Progress Notes (Signed)
SUBJECTIVE: The patient is doing well today, OOB tochair, family at bedside.  At this time, he denies chest pain or shortness of breath.  Marland Kitchen antiseptic oral rinse  7 mL Mouth Rinse BID  . aspirin EC  81 mg Oral Daily  . bisacodyl  10 mg Oral Daily   Or  . bisacodyl  10 mg Rectal Daily  . furosemide  120 mg Intravenous 3 times per day  . furosemide  120 mg Intravenous Once  . metolazone  5 mg Oral Daily  . pantoprazole  40 mg Oral Daily  . sodium chloride  10-40 mL Intracatheter Q12H  . sodium chloride  3 mL Intravenous Q12H  . warfarin  5 mg Oral q1800  . Warfarin - Physician Dosing Inpatient   Does not apply q1800   . sodium chloride 10 mL/hr at 11/20/15 0800  . sodium chloride Stopped (11/20/15 0500)  . heparin 1,700 Units/hr (11/20/15 0800)    OBJECTIVE: Physical Exam: Filed Vitals:   11/20/15 0400 11/20/15 0600 11/20/15 0700 11/20/15 0800  BP: 99/65 109/82 103/80 112/80  Pulse: 58 60 59 64  Temp: 97.9 F (36.6 C)   98 F (36.7 C)  TempSrc: Oral   Oral  Resp: 22 15 6 14   Height:      Weight: 251 lb 1.7 oz (113.9 kg)     SpO2: 96% 96% 99% 98%    Intake/Output Summary (Last 24 hours) at 11/20/15 [id number] Last data filed at 11/20/15 0800  Gross per 24 hour  Intake 1185.59 ml  Output    910 ml  Net 275.59 ml    Telemetry reveals currently  SR, 1st degree AVblock, 60-70 this AM, (pacer at VVI 56bpm), occassionally V paced, overnight appears to have had some pacing, looks inappropriate.  (RN mentions pacer sensitivity was adjusted and no further pacing observed.) and some PAF   GEN- The patient is extubated, alert and oriented x 3 today Head- normocephalic, atraumatic Eyes-  Sclera clear, conjunctiva pink Ears- hearing intact Oropharynx- clear Neck- supple with no JVD Lungs- Clear to ausculation bilaterally, normal work of breathing Heart- Regular rate and rhythm, mechanical S1, no significant murmurs, no rubs or gallops GI- soft, NT, ND Extremities- no  clubbing, cyanosis, + edema Skin- no rash or lesion Psych- euthymic mood, full affect Neuro- no gross deficits appreciated  LABS: Basic Metabolic Panel:  Recent Labs  11/19/15 0308 11/20/15 0716  NA 130* 129*  K 5.1 4.8  CL 90* 90*  CO2 28 26  GLUCOSE 103* 87  BUN 52* 62*  CREATININE 5.27* 6.52*  CALCIUM 8.5* 8.4*  MG 2.5* 2.5*  PHOS 6.8* 7.3*   Liver Function Tests:  Recent Labs  11/18/15 0400  AST 319*  ALT 325*  ALKPHOS 326*  BILITOT 0.6  PROT 5.0*  ALBUMIN 2.1*   No results for input(s): LIPASE, AMYLASE in the last 72 hours. CBC:  Recent Labs  11/19/15 0308 11/20/15 0716  WBC 9.7 6.7  HGB 10.6* 10.4*  HCT 32.5* 30.7*  MCV 88.8 88.0  PLT 167 172   Cardiac Enzymes:  Recent Labs  11/18/15 0400  TROPONINI 50.54*    RADIOLOGY: Dg Chest Port 1 View 11/18/2015  CLINICAL DATA:  Status post minimally invasive mitral valve replacement on November 13, 2015, history of chronic CHF EXAM: PORTABLE CHEST 1 VIEW COMPARISON:  Portable chest x-ray of November 17, 2015 FINDINGS: The trachea and esophagus have been extubated. The lungs are adequately inflated. The 2 right-sided chest  tube remain. There is no pneumothorax or pleural effusion. There is atelectasis in the right perihilar region which has improved. There is persistent left lower lobe atelectasis which is stable. The cardiac silhouette is enlarged. The prosthetic mitral valve ring is visible. The pulmonary vascularity is less prominent today. IMPRESSION: Mild interval improvement in the pulmonary interstitium consistent with resolving interstitial edema. There is persistent bibasilar atelectasis greater on the left than on the right. There is no significant pleural effusion and no pneumothorax. The remaining support tubes are in reasonable position. Electronically Signed   By: David  Martinique M.D.   On: 11/18/2015 07:42    ASSESSMENT AND PLAN:  1. Severe MR  S/p MVR (mechanical)  POD #       On  coumadin/heparin gtt, INR 1.7  2. Post-op CHB  He is in SR 1st degree AVblock this morning in the 60-65bpm range     Plan for echo/re-eval his EF today to help guide device decision     Will plan BiV device, pacing or ICD pending course/echo, thoughts were for today, given his worsening renal function/electrolytes and no longer pacer dependent will hold off      Recommend decrease pacing rate to 30bpm       3. VF arrest 11/16/15  Prolonged event with multiple shocks, CPR, multiple meds  Post arrest echo 11/16/15 with EF 20-25%, + WMA     TEE 11/17/15 EF = 35-40% with severe inferolateral HK, MITRAL VALVE: Mechanical valve. Normal function. No clot.   Mechanism suspect to be embolic, plan is to re-cath with cardiology when able.  Given renal function, this may be delayed for some time by notes from yesterday     Echo ordered yesterday still pending, device type decision pending this  4. Multisystem organ dysfunction, post code  extubated     Remains off pressors with stable BP  ARI, worse again today (62/6.52), fluid positive  Abn/elevated LFTs, improving last eval   5. Leukocytosis     resolved     Afebrile     Continue with primary care team  6. Hyponatremia     Deferred to primary care team/heart failure   Tommye Standard, PA-C 11/20/2015 9:09 AM  EP Attending  Patient seen and examined. Agree with above findings. He is much improved. His EF has normalized and his AV conduction is now intact, although first degree AV block remains. His renal function still poor but I suspect he will recover this. Would continue supportive care. I am not convinced he will need a device. If kidney function improves, I think cardiac cath would be valuable. Please call us if his cardiac conduction worsens. Catheterization would be next week if kidney function improves.   Mikle Bosworth.D.

## 2015-11-20 NOTE — Progress Notes (Signed)
Subjective:   Feels good. Sitting up in chair.  No SOB. +bloating and edema. Got one dose of IV lasix with only mild response. About 1L of urine output yesterday. Weight up nearly 30 pounds.  Rhythm now NSR with 1HB.   Creatinine up to 6.5. No uremic symptoms  TEE with EF 35-40% no vegetation on valve or dissection.   F/u echo pending     Intake/Output Summary (Last 24 hours) at 11/20/15 0817 Last data filed at 11/20/15 0800  Gross per 24 hour  Intake 1462.59 ml  Output    940 ml  Net 522.59 ml    Current meds: . antiseptic oral rinse  7 mL Mouth Rinse BID  . aspirin EC  81 mg Oral Daily  . bisacodyl  10 mg Oral Daily   Or  . bisacodyl  10 mg Rectal Daily  . furosemide  80 mg Intravenous Once  . pantoprazole  40 mg Oral Daily  . sodium chloride  10-40 mL Intracatheter Q12H  . sodium chloride  3 mL Intravenous Q12H  . warfarin  5 mg Oral q1800  . Warfarin - Physician Dosing Inpatient   Does not apply q1800   Infusions: . sodium chloride 10 mL/hr at 11/20/15 0800  . sodium chloride Stopped (11/20/15 0500)  . heparin 1,700 Units/hr (11/20/15 0800)     Objective:  Blood pressure 109/82, pulse 60, temperature 97.9 F (36.6 C), temperature source Oral, resp. rate 15, height 5\' 8"  (1.727 m), weight 113.9 kg (251 lb 1.7 oz), SpO2 96 %. Weight change: 3.6 kg (7 lb 15 oz)  Physical Exam: General:  Sitting in chair HEENT: normal Neck: supple. JVP to jaw Carotids 2+ bilat; no bruits. No lymphadenopathy or thryomegaly appreciated. Cor: PMI nondisplaced. Regular rate & rhythm. Mechanical s1 Lungs: clear Abdomen: soft, nontender, + distended. No hepatosplenomegaly. No bruits or masses. Good bowel sounds. Extremities: no cyanosis, clubbing, rash, 2-3+ edema L>> R Neuro: alert and oriented x 3 cranial nerves grossly intact. moves all 4 extremities w/o difficulty. Affect pleasant  Telemetry: AV paced CHB underneath  Lab Results: Basic Metabolic Panel:  Recent  Labs Lab 11/14/15 1847  11/16/15 2238 11/17/15 0443 11/17/15 0856 11/18/15 0400 11/18/15 0745 11/19/15 0308 11/20/15 0716  NA  --   < > 138 137  --   --  133* 130* 129*  K  --   < > 3.8 4.2  --   --  4.9 5.1 4.8  CL  --   < > 102 99*  --   --  90* 90* 90*  CO2  --   < > 27 28  --   --  29 28 26   GLUCOSE  --   < > 120* 98  --   --  141* 103* 87  BUN  --   < > 21* 26*  --   --  44* 52* 62*  CREATININE 1.03  < > 2.08* 2.46*  --   --  4.55* 5.27* 6.52*  CALCIUM  --   < > 7.2* 7.3*  --   --  8.2* 8.5* 8.4*  MG 2.2  --   --   --  2.3 2.4  --  2.5* 2.5*  PHOS  --   --   --   --  2.1* 5.5*  --  6.8* 7.3*  < > = values in this interval not displayed. Liver Function Tests:  Recent Labs Lab 11/16/15 1110 11/16/15 2238 11/17/15 0855 11/18/15 0400  AST 213* 864* 762* 319*  ALT 77* 323* 328* 325*  ALKPHOS 314* 443* 399* 326*  BILITOT 0.4 0.6 0.7 0.6  PROT 3.3* 4.5* 3.5* 5.0*  ALBUMIN 1.5* 1.9* 2.0* 2.1*   No results for input(s): LIPASE, AMYLASE in the last 168 hours. No results for input(s): AMMONIA in the last 168 hours. CBC:  Recent Labs Lab 11/16/15 1110 11/17/15 0443 11/18/15 0400 11/19/15 0308 11/20/15 0716  WBC 9.2 15.2* 15.4* 9.7 6.7  HGB 9.9* 12.1* 11.1* 10.6* 10.4*  HCT 31.6* 35.6* 33.0* 32.5* 30.7*  MCV 93.2 86.6 88.9 88.8 88.0  PLT 126* 141* 162 167 172   Cardiac Enzymes:  Recent Labs Lab 11/16/15 1110 11/16/15 1500 11/16/15 2238 11/18/15 0400  CKTOTAL 158  --   --   --   CKMB 7.0*  --   --   --   TROPONINI 2.95* 10.64* >65.00* 50.54*   BNP: Invalid input(s): POCBNP CBG:  Recent Labs Lab 11/18/15 2343 11/19/15 0353 11/19/15 0756 11/19/15 1210 11/19/15 1549  GLUCAP 84 77 93 76 75   Microbiology: Lab Results  Component Value Date   CULT NO GROWTH 5 DAYS 05/29/2015   CULT NO GROWTH 5 DAYS 05/29/2015   No results for input(s): CULT, SDES in the last 168 hours.  Imaging: Dg Chest Port 1 View  11/19/2015  CLINICAL DATA:  Acute  respiratory failure, chronic CHF with mitral regurgitation and mitral valve replacement on 13 November 2015. EXAM: PORTABLE CHEST 1 VIEW COMPARISON:  Portable chest x-ray of November 18, 2015. FINDINGS: The left lung is well-expanded. Minimal basilar atelectasis persists. On the right subsegmental atelectasis persists lateral to the heart border. The right-sided chest tubes have been removed. There is no significant pleural effusion and there is no pneumothorax. There is stable cardiomegaly without pulmonary vascular congestion. The mitral valve ring is visible. The PICC line tip on the right projects over the midportion of the SVC. IMPRESSION: 1. Persistent atelectasis at both lung bases. Stable cardiomegaly without pulmonary edema. 2. Interval removal of the 2 right-sided chest tubes without evidence of pneumothorax or pleural effusion. Electronically Signed   By: David  Martinique M.D.   On: 11/19/2015 07:41     ASSESSMENT:  1. Cardiac arrest VT/VF - 11/16/15 2. 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 Cardiogenic shock s/p cardiac arrest 4. Probable ischemic cardiomyopathy EF 25-30% 5. AKI 6. Shock liver 7. Acute respiratory failure due to #1 8. CHB  PLAN/DISCUSSION:  Clinical data supports that he likely had embolus down RCA resulting in VF arrest. Now stabilized.   Continues to improve clinically though creatinine still on the rise due to ATN. Rate of rise remains brisk. Hopefully will plateau soon. He has marked volume overload but no respiratory compromise or uremia. Will give lasix 120 q8 and metolazone. If no response will need to call Renal. I am worried he may need short course of CVVHD.Marland Kitchen   Rhythm now NSR with 1AVB. No need for PPM currently. Need to follow closely. Await repeat echo, D/w EP.   Continue heparin. As we are deferring cath due to AKI will start coumadin back now. Would continue abx for at least 7 days. Plan cath as outpatient     LOS: 7  days    Glori Bickers, MD 11/20/2015, 8:17 AM

## 2015-11-20 NOTE — Progress Notes (Signed)
  Echocardiogram 2D Echocardiogram has been performed.  Oscar Brown 11/20/2015, 10:30 AM

## 2015-11-21 ENCOUNTER — Inpatient Hospital Stay (HOSPITAL_COMMUNITY): Payer: Managed Care, Other (non HMO)

## 2015-11-21 DIAGNOSIS — N170 Acute kidney failure with tubular necrosis: Secondary | ICD-10-CM

## 2015-11-21 DIAGNOSIS — I5033 Acute on chronic diastolic (congestive) heart failure: Secondary | ICD-10-CM

## 2015-11-21 LAB — PROTIME-INR
INR: 1.68 — ABNORMAL HIGH (ref 0.00–1.49)
PROTHROMBIN TIME: 19.8 s — AB (ref 11.6–15.2)

## 2015-11-21 LAB — BASIC METABOLIC PANEL
ANION GAP: 13 (ref 5–15)
BUN: 66 mg/dL — AB (ref 6–20)
CO2: 27 mmol/L (ref 22–32)
Calcium: 8.5 mg/dL — ABNORMAL LOW (ref 8.9–10.3)
Chloride: 89 mmol/L — ABNORMAL LOW (ref 101–111)
Creatinine, Ser: 7 mg/dL — ABNORMAL HIGH (ref 0.61–1.24)
GFR calc Af Amer: 10 mL/min — ABNORMAL LOW (ref >60)
GFR, EST NON AFRICAN AMERICAN: 8 mL/min — AB (ref >60)
GLUCOSE: 94 mg/dL (ref 65–99)
POTASSIUM: 4.3 mmol/L (ref 3.5–5.1)
Sodium: 129 mmol/L — ABNORMAL LOW (ref 135–145)

## 2015-11-21 LAB — CBC
HEMATOCRIT: 29.9 % — AB (ref 39.0–52.0)
Hemoglobin: 10 g/dL — ABNORMAL LOW (ref 13.0–17.0)
MCH: 29.2 pg (ref 26.0–34.0)
MCHC: 33.4 g/dL (ref 30.0–36.0)
MCV: 87.4 fL (ref 78.0–100.0)
PLATELETS: 196 10*3/uL (ref 150–400)
RBC: 3.42 MIL/uL — ABNORMAL LOW (ref 4.22–5.81)
RDW: 13.2 % (ref 11.5–15.5)
WBC: 6.6 10*3/uL (ref 4.0–10.5)

## 2015-11-21 LAB — HEPARIN LEVEL (UNFRACTIONATED)
Heparin Unfractionated: 0.18 IU/mL — ABNORMAL LOW (ref 0.30–0.70)
Heparin Unfractionated: 0.16 IU/mL — ABNORMAL LOW (ref 0.30–0.70)
Heparin Unfractionated: 0.28 IU/mL — ABNORMAL LOW (ref 0.30–0.70)

## 2015-11-21 LAB — PHOSPHORUS: PHOSPHORUS: 7.1 mg/dL — AB (ref 2.5–4.6)

## 2015-11-21 LAB — MAGNESIUM: MAGNESIUM: 2.5 mg/dL — AB (ref 1.7–2.4)

## 2015-11-21 MED ORDER — GUAIFENESIN ER 600 MG PO TB12
600.0000 mg | ORAL_TABLET | Freq: Two times a day (BID) | ORAL | Status: AC
  Administered 2015-11-21 – 2015-11-24 (×6): 600 mg via ORAL
  Filled 2015-11-21 (×6): qty 1

## 2015-11-21 MED ORDER — FUROSEMIDE 10 MG/ML IJ SOLN
120.0000 mg | Freq: Two times a day (BID) | INTRAVENOUS | Status: DC
  Administered 2015-11-22: 120 mg via INTRAVENOUS
  Filled 2015-11-21 (×2): qty 12

## 2015-11-21 NOTE — Progress Notes (Addendum)
[address]       [place] [address]             [phone]        CARDIOTHORACIC SURGERY PROGRESS NOTE   R8 Days Post-Op Procedure(s) (LRB): TRANSESOPHAGEAL ECHOCARDIOGRAM (TEE) (N/A) MINIMALLY INVASIVE MITRAL VALVE (MV) REPLACEMENT (Right)  Subjective: Feels well.  Slept well.  Denies pain, SOB  Objective: Vital signs: BP Readings from Last 1 Encounters:  11/21/15 113/73   Pulse Readings from Last 1 Encounters:  11/21/15 64   Resp Readings from Last 1 Encounters:  11/21/15 16   Temp Readings from Last 1 Encounters:  11/21/15 98.3 F (36.8 C) Oral    Hemodynamics: CVP:  [9 mmHg-30 mmHg] 14 mmHg  Physical Exam:  Rhythm:   sinus  Breath sounds: clear  Heart sounds:  RRR  Incisions:  Clean and dry  Abdomen:  Soft, non-distended, non-tender  Extremities:  Warm, well-perfused    Intake/Output from previous day: 12/22 0701 - 12/23 0700 In: 1467.8 [P.O.:600; I.V.:681.8; IV Piggyback:186] Out: 2960 [Urine:2960] Intake/Output this shift:    Lab Results:  CBC: Recent Labs  11/20/15 0716 11/21/15 0413  WBC 6.7 6.6  HGB 10.4* 10.0*  HCT 30.7* 29.9*  PLT 172 196    BMET:  Recent Labs  11/20/15 0716 11/21/15 0413  NA 129* 129*  K 4.8 4.3  CL 90* 89*  CO2 26 27  GLUCOSE 87 94  BUN 62* 66*  CREATININE 6.52* 7.00*  CALCIUM 8.4* 8.5*     PT/INR:   Recent Labs  11/21/15 0413  LABPROT 19.8*  INR 1.68*    CBG (last 3)   Recent Labs  11/20/15 0827 11/20/15 1151 11/20/15 1610  GLUCAP 74 99 86    ABG    Component Value Date/Time   PHART 7.596* 11/17/2015 0814   PCO2ART 33.6* 11/17/2015 0814   PO2ART 87.0 11/17/2015 0814   HCO3 33.0* 11/17/2015 0814   TCO2 34 11/17/2015 0814   ACIDBASEDEF 2.0 11/16/2015 1333   O2SAT 55.1 11/20/2015 0850    CXR: PORTABLE CHEST 1 VIEW  COMPARISON: Portable exam 0617 hours compared to 11/19/2015  FINDINGS: RIGHT arm PICC line with tip projecting over cavoatrial  junction.  Epicardial pacing wires noted.  Enlargement of cardiac silhouette post MVR.  Subsegmental atelectasis RIGHT base little changed.  Remaining lungs grossly clear.  No definite pleural effusion or pneumothorax.  No acute osseous findings.  IMPRESSION: Enlargement of cardiac silhouette post MVR.  Persistent RIGHT basilar atelectasis.   Electronically Signed  By: Lavonia Dana M.D.  On: 11/21/2015 08:07   Assessment/Plan: S/P Procedure(s) (LRB): TRANSESOPHAGEAL ECHOCARDIOGRAM (TEE) (N/A) MINIMALLY INVASIVE MITRAL VALVE (MV) REPLACEMENT (Right)  Looks remarkably good now POD8 and 5 days s/p VF arrest Maintaining sinus rhythm with 1st degree AV block  O2 sats 94-97% on RA and CXR clear S/P VF arrest - question acute inferior MI - needs cath at some point - repeat ECHO yesterday with normal LV function Acute renal failure, non-oliguric - likely secondary to acute kidney injury, ATN related to prolonged CODE event - creatinine may be reaching plateau but up to 7 - now diuresing very well and BUN stable Acute on chronic diastolic CHF with expected post-op volume excess, weight up 153 kg since CODE event but down 1.5 kg since yesterday   Watch renal function closely  Hold on plans for cath due to renal dysfunction   Continue IV lasix to stimulate UOP but may need  to cut back soon to avoid over-diuresis  Continue coumadin and d/c IV heparin when INR 2.0  Keep in ICU today and consider transfer step-down once renal function improved if rhythm remains stable  Mobilize  Delay cath until renal function has recovered  ? Whether or not he will need ICD  [name], MD 11/21/2015 8:32 AM

## 2015-11-21 NOTE — Progress Notes (Signed)
Patient ID: Oscar Brown, male   DOB: 02-17-1966, 49 y.o.   MRN: [id number]  SICU Evening Rounds:  Hemodynamically stable in sinus rhythm.  Excellent diuresis on high dose lasix  Heparin drip and coumadin.  Walked a lot today.

## 2015-11-21 NOTE — Procedures (Signed)
Pt has home CPAP and will place when ready.

## 2015-11-21 NOTE — Progress Notes (Signed)
[facility] for Heparin  Indication: MVR  Allergies  Allergen Reactions  . Iohexol Hives and Itching    Patient needs 13 hour prep per Dr. Pascal Lux, after cath he broke out in hives and had itching    Patient Measurements: Height: 5\' 8"  (172.7 cm) Weight: 251 lb 1.7 oz (113.9 kg) IBW/kg (Calculated) : 68.4  Vital Signs: Temp: 98.7 F (37.1 C) (12/23 0400) Temp Source: Oral (12/23 0400) BP: 115/81 mmHg (12/23 0500) Pulse Rate: 66 (12/23 0500)  Labs:  Recent Labs  11/19/15 0308  11/19/15 1827 11/20/15 0716 11/21/15 0413  HGB 10.6*  --   --  10.4* 10.0*  HCT 32.5*  --   --  30.7* 29.9*  PLT 167  --   --  172 196  LABPROT 19.3*  --   --  19.9* 19.8*  INR 1.63*  --   --  1.70* 1.68*  HEPARINUNFRC  --   < > 0.27* 0.32 0.18*  CREATININE 5.27*  --   --  6.52* 7.00*  < > = values in this interval not displayed.  Estimated Creatinine Clearance: 15.6 mL/min (by C-G formula based on Cr of 7).  Assessment: 49 yo male s/p mechanical MVR for heparin Goal of Therapy:  Heparin level 0.3-0.35 units/ml Monitor platelets by anticoagulation protocol: Yes   Plan:  Increase Heparin 1800 units/hr Check heparin level in 8 hours.   Phillis Knack, PharmD, BCPS  11/21/2015 5:08 AM

## 2015-11-21 NOTE — Progress Notes (Signed)
Subjective:   Feels good. Sitting up in chair. Diuresed well yesterday out 3L. Breathing better.   Maintaining NSR. EF on echo 55%!  Creatinine up to 7.0. BUN plateauing. No uremic symptoms.  Co-ox 56%. CVP 12    Intake/Output Summary (Last 24 hours) at 11/21/15 0851 Last data filed at 11/21/15 0600  Gross per 24 hour  Intake 1310.83 ml  Output   2935 ml  Net -1624.17 ml    Current meds: . aspirin EC  81 mg Oral Daily  . bisacodyl  10 mg Oral Daily   Or  . bisacodyl  10 mg Rectal Daily  . furosemide  120 mg Intravenous 3 times per day  . metolazone  5 mg Oral Daily  . pantoprazole  40 mg Oral Daily  . sodium chloride  10-40 mL Intracatheter Q12H  . sodium chloride  3 mL Intravenous Q12H  . warfarin  5 mg Oral q1800  . Warfarin - Physician Dosing Inpatient   Does not apply q1800   Infusions: . sodium chloride Stopped (11/20/15 1400)  . sodium chloride Stopped (11/20/15 0500)  . heparin 1,800 Units/hr (11/21/15 0510)     Objective:  Blood pressure 113/73, pulse 64, temperature 98.3 F (36.8 C), temperature source Oral, resp. rate 16, height 5\' 8"  (1.727 m), weight 112.4 kg (247 lb 12.8 oz), SpO2 97 %. Weight change: -1.5 kg (-3 lb 4.9 oz)  Physical Exam: General:  Sitting in chair HEENT: normal Neck: supple. JVP 10 Carotids 2+ bilat; no bruits. No lymphadenopathy or thryomegaly appreciated. Cor: PMI nondisplaced. Regular rate & rhythm. Mechanical s1 Lungs: clear Abdomen: soft, nontender, + distended. No hepatosplenomegaly. No bruits or masses. Good bowel sounds. Extremities: no cyanosis, clubbing, rash, 2+ edema L>> R Neuro: alert and oriented x 3 cranial nerves grossly intact. moves all 4 extremities w/o difficulty. Affect pleasant  Telemetry: AV paced CHB underneath  Lab Results: Basic Metabolic Panel:  Recent Labs Lab 11/17/15 0443 11/17/15 0856 11/18/15 0400 11/18/15 0745 11/19/15 0308 11/20/15 0716 11/21/15 0413  NA 137  --   --  133* 130*  129* 129*  K 4.2  --   --  4.9 5.1 4.8 4.3  CL 99*  --   --  90* 90* 90* 89*  CO2 28  --   --  29 28 26 27   GLUCOSE 98  --   --  141* 103* 87 94  BUN 26*  --   --  44* 52* 62* 66*  CREATININE 2.46*  --   --  4.55* 5.27* 6.52* 7.00*  CALCIUM 7.3*  --   --  8.2* 8.5* 8.4* 8.5*  MG  --  2.3 2.4  --  2.5* 2.5* 2.5*  PHOS  --  2.1* 5.5*  --  6.8* 7.3* 7.1*   Liver Function Tests:  Recent Labs Lab 11/16/15 1110 11/16/15 2238 11/17/15 0855 11/18/15 0400  AST 213* 864* 762* 319*  ALT 77* 323* 328* 325*  ALKPHOS 314* 443* 399* 326*  BILITOT 0.4 0.6 0.7 0.6  PROT 3.3* 4.5* 3.5* 5.0*  ALBUMIN 1.5* 1.9* 2.0* 2.1*   No results for input(s): LIPASE, AMYLASE in the last 168 hours. No results for input(s): AMMONIA in the last 168 hours. CBC:  Recent Labs Lab 11/17/15 0443 11/18/15 0400 11/19/15 0308 11/20/15 0716 11/21/15 0413  WBC 15.2* 15.4* 9.7 6.7 6.6  HGB 12.1* 11.1* 10.6* 10.4* 10.0*  HCT 35.6* 33.0* 32.5* 30.7* 29.9*  MCV 86.6 88.9 88.8 88.0 87.4  PLT  141* 162 167 172 196   Cardiac Enzymes:  Recent Labs Lab 11/16/15 1110 11/16/15 1500 11/16/15 2238 11/18/15 0400  CKTOTAL 158  --   --   --   CKMB 7.0*  --   --   --   TROPONINI 2.95* 10.64* >65.00* 50.54*   BNP: Invalid input(s): POCBNP CBG:  Recent Labs Lab 11/19/15 1210 11/19/15 1549 11/20/15 0827 11/20/15 1151 11/20/15 1610  GLUCAP 76 75 74 99 86   Microbiology: Lab Results  Component Value Date   CULT NO GROWTH 5 DAYS 05/29/2015   CULT NO GROWTH 5 DAYS 05/29/2015   No results for input(s): CULT, SDES in the last 168 hours.  Imaging: US Renal  11/20/2015  CLINICAL DATA:  Acute renal failure EXAM: RENAL / URINARY TRACT ULTRASOUND COMPLETE COMPARISON:  CT angio abdomen/pelvis dated 11/10/2015 FINDINGS: Right Kidney: Length: 14.5 cm.  No mass or hydronephrosis. Left Kidney: Length: 15.1 cm.  No mass or hydronephrosis. Bladder: Within normal limits. Additional comments:  Right pleural effusion.  IMPRESSION: No hydronephrosis. Right pleural effusion. Electronically Signed   By: Julian Hy M.D.   On: 11/20/2015 11:35   Dg Chest Port 1 View  11/21/2015  CLINICAL DATA:  Pneumonia, CHF EXAM: PORTABLE CHEST 1 VIEW COMPARISON:  Portable exam 0617 hours compared to 11/19/2015 FINDINGS: RIGHT arm PICC line with tip projecting over cavoatrial junction. Epicardial pacing wires noted. Enlargement of cardiac silhouette post MVR. Subsegmental atelectasis RIGHT base little changed. Remaining lungs grossly clear. No definite pleural effusion or pneumothorax. No acute osseous findings. IMPRESSION: Enlargement of cardiac silhouette post MVR. Persistent RIGHT basilar atelectasis. Electronically Signed   By: Lavonia Dana M.D.   On: 11/21/2015 08:07     ASSESSMENT:  1. Cardiac arrest VT/VF - 11/16/15 2. 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 Cardiogenic shock s/p cardiac arrest 4. Probable ischemic cardiomyopathy EF 25-30% 5. AKI 6. Shock liver 7. Acute respiratory failure due to #1 8. CHB  PLAN/DISCUSSION:  Clinical data supports that he likely had embolus down RCA resulting in VF arrest. However EF now normal on echo.   Continues to improve clinically though creatinine still on the rise due to ATN. Has good urine output with high-does lasix. No uremia. Will continue to follow. Have discussed with Renal. No indication for CVVHD at this point. Will decrease lasix to 120 bid.   Rhythm now NSR with 1AVB. No need for PPM or ICD currently.   Continue heparin. As we are deferring cath due to AKI coumadin has been restarted.Plan cath as outpatient     LOS: 8 days    Glori Bickers, MD 11/21/2015, 8:51 AM

## 2015-11-21 NOTE — Progress Notes (Signed)
ANTICOAGULATION CONSULT NOTE - Follow Up Consult  Pharmacy Consult for Heparin  Indication: MVR  Allergies  Allergen Reactions  . Iohexol Hives and Itching    Patient needs 13 hour prep per Dr. Pascal Lux, after cath he broke out in hives and had itching    Patient Measurements: Height: 5\' 8"  (172.7 cm) Weight: 247 lb 12.8 oz (112.4 kg) IBW/kg (Calculated) : 68.4  Vital Signs: Temp: 97.4 F (36.3 C) (12/23 2000) Temp Source: Oral (12/23 2000) BP: 117/80 mmHg (12/23 2000) Pulse Rate: 69 (12/23 2000)  Labs:  Recent Labs  11/19/15 0308  11/20/15 0716 11/21/15 0413 11/21/15 1145 11/21/15 2135  HGB 10.6*  --  10.4* 10.0*  --   --   HCT 32.5*  --  30.7* 29.9*  --   --   PLT 167  --  172 196  --   --   LABPROT 19.3*  --  19.9* 19.8*  --   --   INR 1.63*  --  1.70* 1.68*  --   --   HEPARINUNFRC  --   < > 0.32 0.18* 0.16* 0.28*  CREATININE 5.27*  --  6.52* 7.00*  --   --   < > = values in this interval not displayed.  Estimated Creatinine Clearance: 15.5 mL/min (by C-G formula based on Cr of 7).  Assessment: 75 yom continues on IV heparin+warfarin per MD for MVR. Heparin level remains slightly low at 0.28 after rate increase. No bleeding or issues per RN. CBC stable. INR subtherapeutic 1.68 today.  Goal of Therapy:  Heparin level 0.3-0.35 units/ml Monitor platelets by anticoagulation protocol: Yes   Plan:  - Increase heparin gtt very slightly to 2050 units/h - Warfarin 5mg  daily per MD - Check an 8 hour heparin level - Daily heparin level/CBC/INR - Mon s/sx bleeding  Elicia Lamp, PharmD, BCPS Clinical Pharmacist Pager [phone] 11/21/2015 10:12 PM

## 2015-11-21 NOTE — Progress Notes (Addendum)
ANTICOAGULATION CONSULT NOTE - Follow Up Consult  Pharmacy Consult for Heparin  Indication: MVR  Allergies  Allergen Reactions  . Iohexol Hives and Itching    Patient needs 13 hour prep per Dr. Pascal Lux, after cath he broke out in hives and had itching    Patient Measurements: Height: 5\' 8"  (172.7 cm) Weight: 247 lb 12.8 oz (112.4 kg) IBW/kg (Calculated) : 68.4  Vital Signs: Temp: 99 F (37.2 C) (12/23 1100) Temp Source: Oral (12/23 1100) BP: 129/96 mmHg (12/23 1100) Pulse Rate: 133 (12/23 1100)  Labs:  Recent Labs  11/19/15 0308  11/20/15 0716 11/21/15 0413 11/21/15 1145  HGB 10.6*  --  10.4* 10.0*  --   HCT 32.5*  --  30.7* 29.9*  --   PLT 167  --  172 196  --   LABPROT 19.3*  --  19.9* 19.8*  --   INR 1.63*  --  1.70* 1.68*  --   HEPARINUNFRC  --   < > 0.32 0.18* 0.16*  CREATININE 5.27*  --  6.52* 7.00*  --   < > = values in this interval not displayed.  Estimated Creatinine Clearance: 15.5 mL/min (by C-G formula based on Cr of 7).  Assessment: 31 yom continues on IV heparin for MVR. Heparin level remains low at 0.16. No bleeding or issues per RN.  Goal of Therapy:  Heparin level 0.3-0.35 units/ml Monitor platelets by anticoagulation protocol: Yes   Plan:  - Increase heparin gtt to 2000 units/hr - Check an 8 hour heparin level - Daily heparin level and CBC  Salome Arnt, PharmD, BCPS Pager # [phone] 11/21/2015 12:27 PM

## 2015-11-22 LAB — CBC
HCT: 29 % — ABNORMAL LOW (ref 39.0–52.0)
Hemoglobin: 9.9 g/dL — ABNORMAL LOW (ref 13.0–17.0)
MCH: 29.8 pg (ref 26.0–34.0)
MCHC: 34.1 g/dL (ref 30.0–36.0)
MCV: 87.3 fL (ref 78.0–100.0)
PLATELETS: 209 10*3/uL (ref 150–400)
RBC: 3.32 MIL/uL — AB (ref 4.22–5.81)
RDW: 13.2 % (ref 11.5–15.5)
WBC: 7.8 10*3/uL (ref 4.0–10.5)

## 2015-11-22 LAB — BASIC METABOLIC PANEL
ANION GAP: 12 (ref 5–15)
BUN: 68 mg/dL — ABNORMAL HIGH (ref 6–20)
CO2: 28 mmol/L (ref 22–32)
Calcium: 8.7 mg/dL — ABNORMAL LOW (ref 8.9–10.3)
Chloride: 90 mmol/L — ABNORMAL LOW (ref 101–111)
Creatinine, Ser: 7.93 mg/dL — ABNORMAL HIGH (ref 0.61–1.24)
GFR calc non Af Amer: 7 mL/min — ABNORMAL LOW (ref >60)
GFR, EST AFRICAN AMERICAN: 8 mL/min — AB (ref >60)
Glucose, Bld: 99 mg/dL (ref 65–99)
POTASSIUM: 3.9 mmol/L (ref 3.5–5.1)
SODIUM: 130 mmol/L — AB (ref 135–145)

## 2015-11-22 LAB — HEPARIN LEVEL (UNFRACTIONATED)
Heparin Unfractionated: 0.34 IU/mL (ref 0.30–0.70)
Heparin Unfractionated: 0.43 IU/mL (ref 0.30–0.70)

## 2015-11-22 LAB — PROTIME-INR
INR: 1.71 — AB (ref 0.00–1.49)
Prothrombin Time: 20 seconds — ABNORMAL HIGH (ref 11.6–15.2)

## 2015-11-22 MED ORDER — FUROSEMIDE 10 MG/ML IJ SOLN
120.0000 mg | Freq: Every day | INTRAVENOUS | Status: DC
  Administered 2015-11-23: 120 mg via INTRAVENOUS
  Filled 2015-11-22 (×3): qty 12

## 2015-11-22 NOTE — Progress Notes (Signed)
ANTICOAGULATION CONSULT NOTE - Follow Up Consult  Pharmacy Consult for Heparin Indication: MVR  Allergies  Allergen Reactions  . Iohexol Hives and Itching    Patient needs 13 hour prep per Dr. Pascal Lux, after cath he broke out in hives and had itching    Patient Measurements: Height: 5\' 8"  (172.7 cm) Weight: 233 lb (105.688 kg) IBW/kg (Calculated) : 68.4  Vital Signs: Temp: 98 F (36.7 C) (12/24 1232) Temp Source: Oral (12/24 1232) BP: 102/74 mmHg (12/24 1200) Pulse Rate: 57 (12/24 1200)  Labs:  Recent Labs  11/20/15 0716 11/21/15 0413  11/21/15 2135 11/22/15 0415 11/22/15 1241  HGB 10.4* 10.0*  --   --  9.9*  --   HCT 30.7* 29.9*  --   --  29.0*  --   PLT 172 196  --   --  209  --   LABPROT 19.9* 19.8*  --   --  20.0*  --   INR 1.70* 1.68*  --   --  1.71*  --   HEPARINUNFRC 0.32 0.18*  < > 0.28* 0.34 0.43  CREATININE 6.52* 7.00*  --   --  7.93*  --   < > = values in this interval not displayed.  Estimated Creatinine Clearance: 13.3 mL/min (by C-G formula based on Cr of 7.93).   Assessment: Heparin level therapeutic but slightly higher than MD recommended goal of 0.3 to 0.35.  No bleeding noted.   Goal of Therapy:  Heparin level 0.3-0.35 (per MD) Monitor platelets by anticoagulation protocol: Yes   Plan:  -Reduce heparin to 2000 units/hr  -Daily HL and CBC  Sloan Leiter, PharmD, BCPS Clinical Pharmacist [phone] 11/22/2015,1:14 PM

## 2015-11-22 NOTE — Progress Notes (Addendum)
ANTICOAGULATION CONSULT NOTE - Follow Up Consult  Pharmacy Consult for Heparin  Indication: MVR  Allergies  Allergen Reactions  . Iohexol Hives and Itching    Patient needs 13 hour prep per Dr. Pascal Lux, after cath he broke out in hives and had itching    Patient Measurements: Height: 5\' 8"  (172.7 cm) Weight: 247 lb 12.8 oz (112.4 kg) IBW/kg (Calculated) : 68.4  Vital Signs: Temp: 98.1 F (36.7 C) (12/24 0420) Temp Source: Oral (12/24 0420) BP: 104/78 mmHg (12/24 0400) Pulse Rate: 63 (12/24 0400)  Labs:  Recent Labs  11/20/15 0716 11/21/15 0413 11/21/15 1145 11/21/15 2135 11/22/15 0415  HGB 10.4* 10.0*  --   --  9.9*  HCT 30.7* 29.9*  --   --  29.0*  PLT 172 196  --   --  209  LABPROT 19.9* 19.8*  --   --  20.0*  INR 1.70* 1.68*  --   --  1.71*  HEPARINUNFRC 0.32 0.18* 0.16* 0.28* 0.34  CREATININE 6.52* 7.00*  --   --   --     Estimated Creatinine Clearance: 15.5 mL/min (by C-G formula based on Cr of 7).   Assessment: Heparin level therapeutic x 1 this AM after rate increase  Goal of Therapy:  Heparin level 0.3-0.35 (per MD) Monitor platelets by anticoagulation protocol: Yes   Plan:  -Continue heparin at 2050 units/hr -1200 HL  Oscar Brown 11/22/2015,4:52 AM

## 2015-11-22 NOTE — Progress Notes (Signed)
Subjective:   Feels good. Sitting up in chair. Diuresed well yesterday out ~4L. Weight down 18 pounds but still about 10 pounds up from baseline. Walking a lot.    Maintaining NSR. EF on echo 55%!  Creatinine up to 7.9. BUN plateauing. No uremic symptoms.  CVP 9    Intake/Output Summary (Last 24 hours) at 11/22/15 0810 Last data filed at 11/22/15 0600  Gross per 24 hour  Intake 1407.98 ml  Output   3425 ml  Net -2017.02 ml    Current meds: . aspirin EC  81 mg Oral Daily  . bisacodyl  10 mg Oral Daily   Or  . bisacodyl  10 mg Rectal Daily  . furosemide  120 mg Intravenous BID  . guaiFENesin  600 mg Oral BID  . pantoprazole  40 mg Oral Daily  . sodium chloride  10-40 mL Intracatheter Q12H  . sodium chloride  3 mL Intravenous Q12H  . warfarin  5 mg Oral q1800  . Warfarin - Physician Dosing Inpatient   Does not apply q1800   Infusions: . sodium chloride Stopped (11/20/15 1400)  . sodium chloride Stopped (11/20/15 0500)  . heparin 2,050 Units/hr (11/22/15 0600)     Objective:  Blood pressure 108/77, pulse 65, temperature 98.1 F (36.7 C), temperature source Oral, resp. rate 17, height 5\' 8"  (1.727 m), weight 105.688 kg (233 lb), SpO2 97 %. Weight change: -6.712 kg (-14 lb 12.8 oz)  Physical Exam: General:  Sitting in chair HEENT: normal Neck: supple. JVP 9 Carotids 2+ bilat; no bruits. No lymphadenopathy or thryomegaly appreciated. Cor: PMI nondisplaced. Regular rate & rhythm. Mechanical s1 Lungs: clear Abdomen: soft, nontender, nondistended. No hepatosplenomegaly. No bruits or masses. Good bowel sounds. Extremities: no cyanosis, clubbing, rash, tr -1+ edema L>> R Neuro: alert and oriented x 3 cranial nerves grossly intact. moves all 4 extremities w/o difficulty. Affect pleasant  No asterixis  Telemetry: NSR 60s no VT (reviewed personally)  Lab Results: Basic Metabolic Panel:  Recent Labs Lab 11/17/15 0856 11/18/15 0400 11/18/15 0745 11/19/15 0308  11/20/15 0716 11/21/15 0413 11/22/15 0415  NA  --   --  133* 130* 129* 129* 130*  K  --   --  4.9 5.1 4.8 4.3 3.9  CL  --   --  90* 90* 90* 89* 90*  CO2  --   --  29 28 26 27 28   GLUCOSE  --   --  141* 103* 87 94 99  BUN  --   --  44* 52* 62* 66* 68*  CREATININE  --   --  4.55* 5.27* 6.52* 7.00* 7.93*  CALCIUM  --   --  8.2* 8.5* 8.4* 8.5* 8.7*  MG 2.3 2.4  --  2.5* 2.5* 2.5*  --   PHOS 2.1* 5.5*  --  6.8* 7.3* 7.1*  --    Liver Function Tests:  Recent Labs Lab 11/16/15 1110 11/16/15 2238 11/17/15 0855 11/18/15 0400  AST 213* 864* 762* 319*  ALT 77* 323* 328* 325*  ALKPHOS 314* 443* 399* 326*  BILITOT 0.4 0.6 0.7 0.6  PROT 3.3* 4.5* 3.5* 5.0*  ALBUMIN 1.5* 1.9* 2.0* 2.1*   No results for input(s): LIPASE, AMYLASE in the last 168 hours. No results for input(s): AMMONIA in the last 168 hours. CBC:  Recent Labs Lab 11/18/15 0400 11/19/15 0308 11/20/15 0716 11/21/15 0413 11/22/15 0415  WBC 15.4* 9.7 6.7 6.6 7.8  HGB 11.1* 10.6* 10.4* 10.0* 9.9*  HCT 33.0* 32.5*  30.7* 29.9* 29.0*  MCV 88.9 88.8 88.0 87.4 87.3  PLT 162 167 172 196 209   Cardiac Enzymes:  Recent Labs Lab 11/16/15 1110 11/16/15 1500 11/16/15 2238 11/18/15 0400  CKTOTAL 158  --   --   --   CKMB 7.0*  --   --   --   TROPONINI 2.95* 10.64* >65.00* 50.54*   BNP: Invalid input(s): POCBNP CBG:  Recent Labs Lab 11/19/15 1210 11/19/15 1549 11/20/15 0827 11/20/15 1151 11/20/15 1610  GLUCAP 76 75 74 99 86   Microbiology: Lab Results  Component Value Date   CULT NO GROWTH 5 DAYS 05/29/2015   CULT NO GROWTH 5 DAYS 05/29/2015   No results for input(s): CULT, SDES in the last 168 hours.  Imaging: US Renal  11/20/2015  CLINICAL DATA:  Acute renal failure EXAM: RENAL / URINARY TRACT ULTRASOUND COMPLETE COMPARISON:  CT angio abdomen/pelvis dated 11/10/2015 FINDINGS: Right Kidney: Length: 14.5 cm.  No mass or hydronephrosis. Left Kidney: Length: 15.1 cm.  No mass or hydronephrosis. Bladder:  Within normal limits. Additional comments:  Right pleural effusion. IMPRESSION: No hydronephrosis. Right pleural effusion. Electronically Signed   By: Julian Hy M.D.   On: 11/20/2015 11:35   Dg Chest Port 1 View  11/21/2015  CLINICAL DATA:  Pneumonia, CHF EXAM: PORTABLE CHEST 1 VIEW COMPARISON:  Portable exam 0617 hours compared to 11/19/2015 FINDINGS: RIGHT arm PICC line with tip projecting over cavoatrial junction. Epicardial pacing wires noted. Enlargement of cardiac silhouette post MVR. Subsegmental atelectasis RIGHT base little changed. Remaining lungs grossly clear. No definite pleural effusion or pneumothorax. No acute osseous findings. IMPRESSION: Enlargement of cardiac silhouette post MVR. Persistent RIGHT basilar atelectasis. Electronically Signed   By: Lavonia Dana M.D.   On: 11/21/2015 08:07     ASSESSMENT:  1. Cardiac arrest VT/VF - 11/16/15 2. 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 Cardiogenic shock s/p cardiac arrest 4. Probable ischemic cardiomyopathy EF 25-30% -> 55% 5. AKI 6. Shock liver 7. Acute respiratory failure due to #1 - resolved 8. CHB, resolved  PLAN/DISCUSSION:  Clinical data supports that he likely had embolus down RCA resulting in VF arrest. However EF now normal on echo.   Continues to improve clinically though creatinine still on the rise due to ATN. BUN plateauing. Has good urine output with high-does lasix. No uremia. Will continue to follow. Have discussed with Renal. I suspect renal function will improve. No indication for CVVHD at this point. Will decrease lasix to 120 daily.  Rhythm now NSR with 1AVB.  No VT on monitor. No need for PPM or ICD currently.  Continue heparin. Loading coumadin. Plan cath as outpatient    LOS: 9 days    Glori Bickers, MD 11/22/2015, 8:10 AM

## 2015-11-22 NOTE — Progress Notes (Addendum)
Patient ID: Oscar Brown, male   DOB: [birth year], 49 y.o.   MRN: [id number]  SICU Evening Rounds:  Hemodynamically stable in sinus rhythm  Continues to diurese well.  No problems today.

## 2015-11-22 NOTE — Progress Notes (Signed)
Patient has home CPAP machine, tubing and mask. Placed self and can take self off.

## 2015-11-22 NOTE — Progress Notes (Signed)
9 Days Post-Op Procedure(s) (LRB): TRANSESOPHAGEAL ECHOCARDIOGRAM (TEE) (N/A) MINIMALLY INVASIVE MITRAL VALVE (MV) REPLACEMENT (Right) Subjective:  No complaints  Objective: Vital signs in last 24 hours: Temp:  [97.2 F (36.2 C)-99.6 F (37.6 C)] 98.1 F (36.7 C) (12/24 0420) Pulse Rate:  [52-147] 59 (12/24 0800) Cardiac Rhythm:  [-] Normal sinus rhythm;Heart block (12/24 0800) Resp:  [11-20] 16 (12/24 0800) BP: (104-130)/(67-96) 111/73 mmHg (12/24 0800) SpO2:  [92 %-100 %] 98 % (12/24 0800) Weight:  [105.688 kg (233 lb)] 105.688 kg (233 lb) (12/24 0500)  Hemodynamic parameters for last 24 hours: CVP:  [5 mmHg-19 mmHg] 8 mmHg  Intake/Output from previous day: 12/23 0701 - 12/24 0700 In: [id number] [P.O.:750; I.V.:696] Out: 3785 [Urine:3785] Intake/Output this shift: Total I/O In: 92.5 [I.V.:30.5; IV Piggyback:62] Out: 250 [Urine:250]  General appearance: alert and cooperative Neurologic: intact Heart: regular rate and rhythm, S1, S2 normal, no murmur, click, rub or gallop Lungs: clear to auscultation bilaterally Extremities: edema moderate in legs Wound: incision ok  Lab Results:  Recent Labs  11/21/15 0413 11/22/15 0415  WBC 6.6 7.8  HGB 10.0* 9.9*  HCT 29.9* 29.0*  PLT 196 209   BMET:  Recent Labs  11/21/15 0413 11/22/15 0415  NA 129* 130*  K 4.3 3.9  CL 89* 90*  CO2 27 28  GLUCOSE 94 99  BUN 66* 68*  CREATININE 7.00* 7.93*  CALCIUM 8.5* 8.7*    PT/INR:  Recent Labs  11/22/15 0415  LABPROT 20.0*  INR 1.71*   ABG    Component Value Date/Time   PHART 7.596* 11/17/2015 0814   HCO3 33.0* 11/17/2015 0814   TCO2 34 11/17/2015 0814   ACIDBASEDEF 2.0 11/16/2015 1333   O2SAT 55.1 11/20/2015 0850   CBG (last 3)   Recent Labs  11/20/15 0827 11/20/15 1151 11/20/15 1610  GLUCAP 74 99 86    Assessment/Plan: S/P Procedure(s) (LRB): TRANSESOPHAGEAL ECHOCARDIOGRAM (TEE) (N/A) MINIMALLY INVASIVE MITRAL VALVE (MV) REPLACEMENT (Right)  He remains  hemodynamically stable in sinus rhythm with no arrhythmias or ectopy.  ATN post cardiac arrest: diuresing well with lasix 120 mg and 4L negative yesterday. Creat up slightly and BUN stable. CVP only 8 this am so I agree with slowing diuresis.  INR rising. Continue coumadin and heparin.  Continue mobilization, IS.  DC foley   LOS: 9 days    Gaye Pollack 11/22/2015

## 2015-11-22 NOTE — Plan of Care (Signed)
Problem: Phase III - Recovery through Discharge Goal: Maintain Hemodynamic Stability Outcome: Progressing Pt not pacer dependant at this time.

## 2015-11-23 DIAGNOSIS — I480 Paroxysmal atrial fibrillation: Secondary | ICD-10-CM

## 2015-11-23 LAB — BASIC METABOLIC PANEL
ANION GAP: 14 (ref 5–15)
BUN: 66 mg/dL — AB (ref 6–20)
CALCIUM: 8.9 mg/dL (ref 8.9–10.3)
CO2: 27 mmol/L (ref 22–32)
CREATININE: 7.63 mg/dL — AB (ref 0.61–1.24)
Chloride: 89 mmol/L — ABNORMAL LOW (ref 101–111)
GFR calc Af Amer: 9 mL/min — ABNORMAL LOW (ref >60)
GFR, EST NON AFRICAN AMERICAN: 7 mL/min — AB (ref >60)
GLUCOSE: 101 mg/dL — AB (ref 65–99)
Potassium: 3.7 mmol/L (ref 3.5–5.1)
Sodium: 130 mmol/L — ABNORMAL LOW (ref 135–145)

## 2015-11-23 LAB — CBC
HEMATOCRIT: 31.4 % — AB (ref 39.0–52.0)
Hemoglobin: 10.8 g/dL — ABNORMAL LOW (ref 13.0–17.0)
MCH: 29.8 pg (ref 26.0–34.0)
MCHC: 34.4 g/dL (ref 30.0–36.0)
MCV: 86.7 fL (ref 78.0–100.0)
PLATELETS: 308 10*3/uL (ref 150–400)
RBC: 3.62 MIL/uL — ABNORMAL LOW (ref 4.22–5.81)
RDW: 13.1 % (ref 11.5–15.5)
WBC: 9 10*3/uL (ref 4.0–10.5)

## 2015-11-23 LAB — HEPARIN LEVEL (UNFRACTIONATED)
HEPARIN UNFRACTIONATED: 0.35 [IU]/mL (ref 0.30–0.70)
Heparin Unfractionated: 0.27 IU/mL — ABNORMAL LOW (ref 0.30–0.70)

## 2015-11-23 LAB — PROTIME-INR
INR: 1.82 — ABNORMAL HIGH (ref 0.00–1.49)
Prothrombin Time: 21 seconds — ABNORMAL HIGH (ref 11.6–15.2)

## 2015-11-23 MED ORDER — AMIODARONE IV BOLUS ONLY 150 MG/100ML
150.0000 mg | Freq: Once | INTRAVENOUS | Status: AC
  Administered 2015-11-23: 150 mg via INTRAVENOUS
  Filled 2015-11-23: qty 100

## 2015-11-23 MED ORDER — AMIODARONE HCL 200 MG PO TABS: 200.0000 mg | ORAL_TABLET | Freq: Every day | ORAL | Status: DC

## 2015-11-23 MED ORDER — POTASSIUM CHLORIDE CRYS ER 20 MEQ PO TBCR
40.0000 meq | EXTENDED_RELEASE_TABLET | Freq: Once | ORAL | Status: AC
  Administered 2015-11-23: 40 meq via ORAL
  Filled 2015-11-23: qty 2

## 2015-11-23 MED ORDER — POTASSIUM CHLORIDE 10 MEQ/50ML IV SOLN
10.0000 meq | INTRAVENOUS | Status: DC | PRN
  Filled 2015-11-23 (×3): qty 50

## 2015-11-23 MED ORDER — AMIODARONE HCL 200 MG PO TABS
200.0000 mg | ORAL_TABLET | Freq: Two times a day (BID) | ORAL | Status: DC
Start: 2015-11-23 — End: 2015-11-29
  Administered 2015-11-23 – 2015-11-28 (×11): 200 mg via ORAL
  Filled 2015-11-23 (×11): qty 1

## 2015-11-23 NOTE — Progress Notes (Addendum)
Subjective:   Feels good. Sitting up in chair. Walking a lot. No dyspnea. Good urine output.   Went into AF about 3am. Rate controlled. Asymptomatic.  Creatinine now coming down. No uremic symptoms.  CVP 9    Intake/Output Summary (Last 24 hours) at 11/23/15 0806 Last data filed at 11/23/15 0700  Gross per 24 hour  Intake 692.64 ml  Output   3700 ml  Net -3007.36 ml    Current meds: . aspirin EC  81 mg Oral Daily  . bisacodyl  10 mg Oral Daily   Or  . bisacodyl  10 mg Rectal Daily  . furosemide  120 mg Intravenous Daily  . guaiFENesin  600 mg Oral BID  . pantoprazole  40 mg Oral Daily  . sodium chloride  10-40 mL Intracatheter Q12H  . sodium chloride  3 mL Intravenous Q12H  . warfarin  5 mg Oral q1800  . Warfarin - Physician Dosing Inpatient   Does not apply q1800   Infusions: . sodium chloride Stopped (11/20/15 1400)  . sodium chloride Stopped (11/20/15 0500)  . heparin 2,000 Units/hr (11/23/15 0347)     Objective:  Blood pressure 103/76, pulse 86, temperature 98.6 F (37 C), temperature source Oral, resp. rate 19, height 5\' 8"  (1.727 m), weight 106 kg (233 lb 11 oz), SpO2 95 %. Weight change: 0.312 kg (11 oz)  Physical Exam: General:  Sitting in chair HEENT: normal Neck: supple. JVP 9 Carotids 2+ bilat; no bruits. No lymphadenopathy or thryomegaly appreciated. Cor: PMI nondisplaced. Irregular rate & rhythm. Mechanical s1 Lungs: clear Abdomen: soft, nontender, nondistended. No hepatosplenomegaly. No bruits or masses. Good bowel sounds. Extremities: no cyanosis, clubbing, rash, tr  edema L>> R Neuro: alert and oriented x 3 cranial nerves grossly intact. moves all 4 extremities w/o difficulty. Affect pleasant  No asterixis  Telemetry: NSR 60s AF since about 3a  Lab Results: Basic Metabolic Panel:  Recent Labs Lab 11/17/15 0856 11/18/15 0400  11/19/15 0308 11/20/15 0716 11/21/15 0413 11/22/15 0415 11/23/15 0412  NA  --   --   < > 130* 129* 129* 130*  130*  K  --   --   < > 5.1 4.8 4.3 3.9 3.7  CL  --   --   < > 90* 90* 89* 90* 89*  CO2  --   --   < > 28 26 27 28 27   GLUCOSE  --   --   < > 103* 87 94 99 101*  BUN  --   --   < > 52* 62* 66* 68* 66*  CREATININE  --   --   < > 5.27* 6.52* 7.00* 7.93* 7.63*  CALCIUM  --   --   < > 8.5* 8.4* 8.5* 8.7* 8.9  MG 2.3 2.4  --  2.5* 2.5* 2.5*  --   --   PHOS 2.1* 5.5*  --  6.8* 7.3* 7.1*  --   --   < > = values in this interval not displayed. Liver Function Tests:  Recent Labs Lab 11/16/15 1110 11/16/15 2238 11/17/15 0855 11/18/15 0400  AST 213* 864* 762* 319*  ALT 77* 323* 328* 325*  ALKPHOS 314* 443* 399* 326*  BILITOT 0.4 0.6 0.7 0.6  PROT 3.3* 4.5* 3.5* 5.0*  ALBUMIN 1.5* 1.9* 2.0* 2.1*   No results for input(s): LIPASE, AMYLASE in the last 168 hours. No results for input(s): AMMONIA in the last 168 hours. CBC:  Recent Labs Lab 11/19/15 0308  11/20/15 0716 11/21/15 0413 11/22/15 0415 11/23/15 0412  WBC 9.7 6.7 6.6 7.8 9.0  HGB 10.6* 10.4* 10.0* 9.9* 10.8*  HCT 32.5* 30.7* 29.9* 29.0* 31.4*  MCV 88.8 88.0 87.4 87.3 86.7  PLT 167 172 196 209 308   Cardiac Enzymes:  Recent Labs Lab 11/16/15 1110 11/16/15 1500 11/16/15 2238 11/18/15 0400  CKTOTAL 158  --   --   --   CKMB 7.0*  --   --   --   TROPONINI 2.95* 10.64* >65.00* 50.54*   BNP: Invalid input(s): POCBNP CBG:  Recent Labs Lab 11/19/15 1210 11/19/15 1549 11/20/15 0827 11/20/15 1151 11/20/15 1610  GLUCAP 76 75 74 99 86   Microbiology: Lab Results  Component Value Date   CULT NO GROWTH 5 DAYS 05/29/2015   CULT NO GROWTH 5 DAYS 05/29/2015   No results for input(s): CULT, SDES in the last 168 hours.  Imaging: No results found.   ASSESSMENT:  1. Cardiac arrest VT/VF - 11/16/15 2. 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 Cardiogenic shock s/p cardiac arrest 4. Probable ischemic cardiomyopathy EF 25-30% -> 55% 5. AKI 6. Shock liver 7. Acute respiratory failure  due to #1 - resolved 8. CHB, resolved 9. New onset AF, 12/25 10. Hypokalemia  PLAN/DISCUSSION:  Clinical data supports that he likely had embolus down RCA resulting in VF arrest. However EF now normal on echo.   Creatinine now improving. Good urine output. Continue lasix 120 IV daily for another day or two. Watch for post ATN diuresis. Supp K+.   Developed AF overnight. Rate controlled. Asx. Will Give one does IV amio then put on 200 bid. Watch closely for heart block. Can plan DC-CV in am if necessary.  INR 1.8. Continue heparin. Loading coumadin. Plan cath as outpatient   Likely can go to the floor after amio bolus.   LOS: 10 days    Glori Bickers, MD 11/23/2015, 8:06 AM

## 2015-11-23 NOTE — Progress Notes (Signed)
  Amiodarone Drug - Drug Interaction Consult Note  Recommendations: Continue current therapy. Will closely monitor INR.  Amiodarone is metabolized by the cytochrome P450 system and therefore has the potential to cause many drug interactions. Amiodarone has an average plasma half-life of 50 days (range 20 to 100 days).   There is potential for drug interactions to occur several weeks or months after stopping treatment and the onset of drug interactions may be slow after initiating amiodarone.   []  Statins: Increased risk of myopathy. Simvastatin- restrict dose to 20mg  daily. Other statins: counsel patients to report any muscle pain or weakness immediately.  [x]  Anticoagulants: Amiodarone can increase anticoagulant effect. Consider warfarin dose reduction. Patients should be monitored closely and the dose of anticoagulant altered accordingly, remembering that amiodarone levels take several weeks to stabilize.  []  Antiepileptics: Amiodarone can increase plasma concentration of phenytoin, the dose should be reduced. Note that small changes in phenytoin dose can result in large changes in levels. Monitor patient and counsel on signs of toxicity.  []  Beta blockers: increased risk of bradycardia, AV block and myocardial depression. Sotalol - avoid concomitant use.  []   Calcium channel blockers (diltiazem and verapamil): increased risk of bradycardia, AV block and myocardial depression.  []   Cyclosporine: Amiodarone increases levels of cyclosporine. Reduced dose of cyclosporine is recommended.  []  Digoxin dose should be halved when amiodarone is started.  [x]  Diuretics: increased risk of cardiotoxicity if hypokalemia occurs.  []  Oral hypoglycemic agents (glyburide, glipizide, glimepiride): increased risk of hypoglycemia. Patient's glucose levels should be monitored closely when initiating amiodarone therapy.   []  Drugs that prolong the QT interval:  Torsades de pointes risk may be increased with  concurrent use - avoid if possible.  Monitor QTc, also keep magnesium/potassium WNL if concurrent therapy can't be avoided. Marland Kitchen Antibiotics: e.g. fluoroquinolones, erythromycin. . Antiarrhythmics: e.g. quinidine, procainamide, disopyramide, sotalol. . Antipsychotics: e.g. phenothiazines, haloperidol.  . Lithium, tricyclic antidepressants, and methadone. Thank You,  Levester Fresh, PharmD, BCPS, [facility] Clinical Pharmacist Pager [phone] 11/23/2015 9:30 AM

## 2015-11-23 NOTE — Progress Notes (Signed)
ANTICOAGULATION CONSULT NOTE - Follow Up Consult  Pharmacy Consult for Heparin Indication: MVR  Allergies  Allergen Reactions  . Iohexol Hives and Itching    Patient needs 13 hour prep per Dr. Pascal Lux, after cath he broke out in hives and had itching    Patient Measurements: Height: 5\' 8"  (172.7 cm) Weight: 233 lb 11 oz (106 kg) IBW/kg (Calculated) : 68.4  Vital Signs: Temp: 98.6 F (37 C) (12/25 1556) Temp Source: Oral (12/25 1556) BP: 102/76 mmHg (12/25 1500) Pulse Rate: 61 (12/25 1500)  Labs:  Recent Labs  11/21/15 0413  11/22/15 0415 11/22/15 1241 11/23/15 0412 11/23/15 1630  HGB 10.0*  --  9.9*  --  10.8*  --   HCT 29.9*  --  29.0*  --  31.4*  --   PLT 196  --  209  --  308  --   LABPROT 19.8*  --  20.0*  --  21.0*  --   INR 1.68*  --  1.71*  --  1.82*  --   HEPARINUNFRC 0.18*  < > 0.34 0.43 0.27* 0.35  CREATININE 7.00*  --  7.93*  --  7.63*  --   < > = values in this interval not displayed.  Estimated Creatinine Clearance: 13.8 mL/min (by C-G formula based on Cr of 7.63).   Assessment: Patient admitted for MVR. Currently on heparin/bridge to warfarin.   Currently on 2050 units/hr heparin, HL 0.35 (therapeutic). No bleeding  Hgb 10.8, plt wnl  Goal of Therapy:  Heparin level 0.3-0.35 (per MD) Monitor platelets by anticoagulation protocol: Yes   Plan:  -Continue heparin to 2050 units/hr -Repeat HL in 8 hrs -Warfarin 5mg  PO daily per MD -Daily INR, CBC and HL  Darl Pikes, PharmD Clinical Pharmacist- Resident Pager: [phone]   11/23/2015 5:35 PM

## 2015-11-23 NOTE — Progress Notes (Signed)
ANTICOAGULATION CONSULT NOTE - Follow Up Consult  Pharmacy Consult for Heparin Indication: MVR  Allergies  Allergen Reactions  . Iohexol Hives and Itching    Patient needs 13 hour prep per Dr. Pascal Lux, after cath he broke out in hives and had itching    Patient Measurements: Height: 5\' 8"  (172.7 cm) Weight: 233 lb 11 oz (106 kg) IBW/kg (Calculated) : 68.4  Vital Signs: Temp: 98.6 F (37 C) (12/25 0734) Temp Source: Oral (12/25 0734) BP: 103/76 mmHg (12/25 0600) Pulse Rate: 86 (12/25 0600)  Labs:  Recent Labs  11/21/15 0413  11/22/15 0415 11/22/15 1241 11/23/15 0412  HGB 10.0*  --  9.9*  --  10.8*  HCT 29.9*  --  29.0*  --  31.4*  PLT 196  --  209  --  308  LABPROT 19.8*  --  20.0*  --  21.0*  INR 1.68*  --  1.71*  --  1.82*  HEPARINUNFRC 0.18*  < > 0.34 0.43 0.27*  CREATININE 7.00*  --  7.93*  --  7.63*  < > = values in this interval not displayed.  Estimated Creatinine Clearance: 13.8 mL/min (by C-G formula based on Cr of 7.63).   Assessment: Patient admitted for MVR. Currently on heparin/bridge to warfarin.   Currently on 2000 units/hr heparin with a low HL of 0.27. No bleeding  Goal of Therapy:  Heparin level 0.3-0.35 (per MD) Monitor platelets by anticoagulation protocol: Yes   Plan:  -Increase heparin to 2050 units/hr -Repeat HL at 1600 -Warfarin 5mg  PO daily per MD -Daily INR, CBC and HL  Levester Fresh, PharmD, BCPS, [facility] Clinical Pharmacist Pager [phone] 11/23/2015 8:07 AM

## 2015-11-23 NOTE — Progress Notes (Signed)
10 Days Post-Op Procedure(s) (LRB): TRANSESOPHAGEAL ECHOCARDIOGRAM (TEE) (N/A) MINIMALLY INVASIVE MITRAL VALVE (MV) REPLACEMENT (Right) Subjective:  No complaints. Walking well  Had episode of rate controlled atrial fib this am and given a bolus of amio followed by po dose. Back in sinus 65.  Objective: Vital signs in last 24 hours: Temp:  [97.9 F (36.6 C)-98.9 F (37.2 C)] 98.9 F (37.2 C) (12/25 1213) Pulse Rate:  [25-98] 69 (12/25 1200) Cardiac Rhythm:  [-] Normal sinus rhythm (12/25 1200) Resp:  [12-25] 18 (12/25 1200) BP: (84-146)/(74-89) 106/89 mmHg (12/25 1200) SpO2:  [82 %-100 %] 98 % (12/25 1200) Weight:  [106 kg (233 lb 11 oz)] 106 kg (233 lb 11 oz) (12/25 0500)  Hemodynamic parameters for last 24 hours: CVP:  [3 mmHg-12 mmHg] 12 mmHg  Intake/Output from previous day: 12/24 0701 - 12/25 0700 In: 785.1 [I.V.:723.1; IV Piggyback:62] Out: [id number] [Urine:3950] Intake/Output this shift: Total I/O In: 60.5 [I.V.:60.5] Out: -   General appearance: alert and cooperative Neurologic: intact Heart: regular rate and rhythm, crisp mechanical valve click Lungs: clear to auscultation bilaterally Extremities: extremities normal, atraumatic, no cyanosis or edema Wound: incisions ok  Lab Results:  Recent Labs  11/22/15 0415 11/23/15 0412  WBC 7.8 9.0  HGB 9.9* 10.8*  HCT 29.0* 31.4*  PLT 209 308   BMET:  Recent Labs  11/22/15 0415 11/23/15 0412  NA 130* 130*  K 3.9 3.7  CL 90* 89*  CO2 28 27  GLUCOSE 99 101*  BUN 68* 66*  CREATININE 7.93* 7.63*  CALCIUM 8.7* 8.9    PT/INR:  Recent Labs  11/23/15 0412  LABPROT 21.0*  INR 1.82*   ABG    Component Value Date/Time   PHART 7.596* 11/17/2015 0814   HCO3 33.0* 11/17/2015 0814   TCO2 34 11/17/2015 0814   ACIDBASEDEF 2.0 11/16/2015 1333   O2SAT 55.1 11/20/2015 0850   CBG (last 3)   Recent Labs  11/20/15 1610  GLUCAP 86    Assessment/Plan: S/P Procedure(s) (LRB): TRANSESOPHAGEAL ECHOCARDIOGRAM  (TEE) (N/A) MINIMALLY INVASIVE MITRAL VALVE (MV) REPLACEMENT (Right)  He remains hemodynamically stable  Brief atrial fibrillation this am converted with amio. Continue po.  ATN post cardiac arrest: creat improving and diuresing well. CVP 9-12 this am. Gained 1 lb yesterday although -3L ?Marland Kitchen Continue daily lasix for a few days.  INR increasing. Continue heparin until INR about 2.  Mobilization and IS.   LOS: 10 days    Oscar Brown 11/23/2015

## 2015-11-24 LAB — BASIC METABOLIC PANEL
ANION GAP: 12 (ref 5–15)
BUN: 63 mg/dL — AB (ref 6–20)
CHLORIDE: 91 mmol/L — AB (ref 101–111)
CO2: 30 mmol/L (ref 22–32)
Calcium: 8.9 mg/dL (ref 8.9–10.3)
Creatinine, Ser: 6.83 mg/dL — ABNORMAL HIGH (ref 0.61–1.24)
GFR calc Af Amer: 10 mL/min — ABNORMAL LOW (ref >60)
GFR calc non Af Amer: 8 mL/min — ABNORMAL LOW (ref >60)
GLUCOSE: 102 mg/dL — AB (ref 65–99)
POTASSIUM: 4.2 mmol/L (ref 3.5–5.1)
Sodium: 133 mmol/L — ABNORMAL LOW (ref 135–145)

## 2015-11-24 LAB — CBC
HCT: 28.9 % — ABNORMAL LOW (ref 39.0–52.0)
HEMOGLOBIN: 9.5 g/dL — AB (ref 13.0–17.0)
MCH: 28.8 pg (ref 26.0–34.0)
MCHC: 32.9 g/dL (ref 30.0–36.0)
MCV: 87.6 fL (ref 78.0–100.0)
Platelets: 298 10*3/uL (ref 150–400)
RBC: 3.3 MIL/uL — AB (ref 4.22–5.81)
RDW: 13.3 % (ref 11.5–15.5)
WBC: 8.7 10*3/uL (ref 4.0–10.5)

## 2015-11-24 LAB — HEPARIN LEVEL (UNFRACTIONATED)
HEPARIN UNFRACTIONATED: 0.41 [IU]/mL (ref 0.30–0.70)
Heparin Unfractionated: 0.46 IU/mL (ref 0.30–0.70)

## 2015-11-24 LAB — PROTIME-INR
INR: 1.99 — ABNORMAL HIGH (ref 0.00–1.49)
Prothrombin Time: 22.5 seconds — ABNORMAL HIGH (ref 11.6–15.2)

## 2015-11-24 MED ORDER — ASPIRIN EC 81 MG PO TBEC
81.0000 mg | DELAYED_RELEASE_TABLET | Freq: Every day | ORAL | Status: DC
  Administered 2015-11-25 – 2015-12-01 (×7): 81 mg via ORAL
  Filled 2015-11-24 (×7): qty 1

## 2015-11-24 MED ORDER — TRAMADOL HCL 50 MG PO TABS
50.0000 mg | ORAL_TABLET | Freq: Two times a day (BID) | ORAL | Status: DC | PRN
  Administered 2015-11-28: 50 mg via ORAL
  Filled 2015-11-24: qty 1

## 2015-11-24 MED ORDER — ONDANSETRON HCL 4 MG/2ML IJ SOLN: 4.0000 mg | Freq: Four times a day (QID) | INTRAMUSCULAR | Status: DC | PRN

## 2015-11-24 MED ORDER — DOCUSATE SODIUM 100 MG PO CAPS
200.0000 mg | ORAL_CAPSULE | Freq: Every day | ORAL | Status: DC
  Administered 2015-11-25 – 2015-12-01 (×7): 200 mg via ORAL
  Filled 2015-11-24 (×7): qty 2

## 2015-11-24 MED ORDER — SODIUM CHLORIDE 0.9 % IJ SOLN
10.0000 mL | INTRAMUSCULAR | Status: DC | PRN
  Administered 2015-11-24: 20 mL
  Administered 2015-11-26 – 2015-11-29 (×4): 10 mL
  Filled 2015-11-24 (×4): qty 40

## 2015-11-24 MED ORDER — FAMOTIDINE 20 MG PO TABS
20.0000 mg | ORAL_TABLET | Freq: Every day | ORAL | Status: DC
  Administered 2015-11-24 – 2015-11-30 (×7): 20 mg via ORAL
  Filled 2015-11-24 (×7): qty 1

## 2015-11-24 MED ORDER — SODIUM CHLORIDE 0.9 % IJ SOLN: 3.0000 mL | INTRAMUSCULAR | Status: DC | PRN

## 2015-11-24 MED ORDER — POTASSIUM CHLORIDE ER 10 MEQ PO TBCR
20.0000 meq | EXTENDED_RELEASE_TABLET | Freq: Two times a day (BID) | ORAL | Status: AC
  Administered 2015-11-24 (×2): 20 meq via ORAL
  Filled 2015-11-24 (×4): qty 2

## 2015-11-24 MED ORDER — ACETAMINOPHEN 325 MG PO TABS: 650.0000 mg | ORAL_TABLET | Freq: Four times a day (QID) | ORAL | Status: DC | PRN

## 2015-11-24 MED ORDER — FAMOTIDINE 20 MG PO TABS: 20.0000 mg | ORAL_TABLET | Freq: Two times a day (BID) | ORAL | Status: DC

## 2015-11-24 MED ORDER — SODIUM CHLORIDE 0.9 % IJ SOLN
3.0000 mL | Freq: Two times a day (BID) | INTRAMUSCULAR | Status: DC
Start: 2015-11-24 — End: 2015-12-01
  Administered 2015-11-24 – 2015-11-30 (×3): 3 mL via INTRAVENOUS

## 2015-11-24 MED ORDER — FUROSEMIDE 10 MG/ML IJ SOLN
80.0000 mg | Freq: Two times a day (BID) | INTRAMUSCULAR | Status: DC
  Administered 2015-11-24 – 2015-11-27 (×7): 80 mg via INTRAVENOUS
  Filled 2015-11-24 (×9): qty 8

## 2015-11-24 MED ORDER — BISACODYL 5 MG PO TBEC
10.0000 mg | DELAYED_RELEASE_TABLET | Freq: Every day | ORAL | Status: DC | PRN
  Administered 2015-11-30: 10 mg via ORAL
  Filled 2015-11-24: qty 2

## 2015-11-24 MED ORDER — OXYCODONE HCL 5 MG PO TABS
5.0000 mg | ORAL_TABLET | ORAL | Status: DC | PRN
  Administered 2015-11-24: 10 mg via ORAL
  Administered 2015-11-24: 5 mg via ORAL
  Administered 2015-11-25 – 2015-11-27 (×8): 10 mg via ORAL
  Administered 2015-11-28 – 2015-12-01 (×10): 5 mg via ORAL
  Filled 2015-11-24: qty 2
  Filled 2015-11-24: qty 1
  Filled 2015-11-24: qty 2
  Filled 2015-11-24: qty 1
  Filled 2015-11-24 (×3): qty 2
  Filled 2015-11-24: qty 1
  Filled 2015-11-24 (×2): qty 2
  Filled 2015-11-24 (×4): qty 1
  Filled 2015-11-24 (×2): qty 2
  Filled 2015-11-24: qty 1
  Filled 2015-11-24: qty 2
  Filled 2015-11-24 (×2): qty 1

## 2015-11-24 MED ORDER — ONDANSETRON HCL 4 MG PO TABS: 4.0000 mg | ORAL_TABLET | Freq: Four times a day (QID) | ORAL | Status: DC | PRN

## 2015-11-24 MED ORDER — BISACODYL 10 MG RE SUPP: 10.0000 mg | Freq: Every day | RECTAL | Status: DC | PRN

## 2015-11-24 MED ORDER — MOVING RIGHT ALONG BOOK
Freq: Once | Status: DC
  Filled 2015-11-24: qty 1

## 2015-11-24 MED ORDER — SODIUM CHLORIDE 0.9 % IV SOLN: 250.0000 mL | INTRAVENOUS | Status: DC | PRN

## 2015-11-24 NOTE — Plan of Care (Signed)
Problem: Phase III - Recovery through Discharge Goal: Maintain Hemodynamic Stability Outcome: Progressing Pacer off. Pt maintained SR/SB through the night

## 2015-11-24 NOTE — Progress Notes (Signed)
11 Days Post-Op Procedure(s) (LRB): TRANSESOPHAGEAL ECHOCARDIOGRAM (TEE) (N/A) MINIMALLY INVASIVE MITRAL VALVE (MV) REPLACEMENT (Right) Subjective:  No complaints. Walked well today  Objective: Vital signs in last 24 hours: Temp:  [97.9 F (36.6 C)-98.8 F (37.1 C)] 98.8 F (37.1 C) (12/26 1129) Pulse Rate:  [50-118] 55 (12/26 1200) Cardiac Rhythm:  [-] Normal sinus rhythm;Sinus bradycardia (12/26 0900) Resp:  [9-23] 13 (12/26 1200) BP: (92-120)/(63-97) 107/69 mmHg (12/26 1200) SpO2:  [85 %-100 %] 98 % (12/26 1200) Weight:  [105.1 kg (231 lb 11.3 oz)] 105.1 kg (231 lb 11.3 oz) (12/26 0500)  Hemodynamic parameters for last 24 hours: CVP:  [2 mmHg-12 mmHg] 10 mmHg  Intake/Output from previous day: 12/25 0701 - 12/26 0700 In: 1456 [P.O.:960; I.V.:496] Out: 3400 [Urine:3400] Intake/Output this shift: Total I/O In: 342 [P.O.:240; I.V.:102] Out: -   General appearance: alert and cooperative Neurologic: intact Heart: regular rate and rhythm, S1, S2 normal, no murmur, click, rub or gallop Lungs: clear to auscultation bilaterally Extremities: edema mild in legs Wound: incisions ok  Lab Results:  Recent Labs  11/23/15 0412 11/24/15 0449  WBC 9.0 8.7  HGB 10.8* 9.5*  HCT 31.4* 28.9*  PLT 308 298   BMET:  Recent Labs  11/23/15 0412 11/24/15 0449  NA 130* 133*  K 3.7 4.2  CL 89* 91*  CO2 27 30  GLUCOSE 101* 102*  BUN 66* 63*  CREATININE 7.63* 6.83*  CALCIUM 8.9 8.9    PT/INR:  Recent Labs  11/24/15 0449  LABPROT 22.5*  INR 1.99*   ABG    Component Value Date/Time   PHART 7.596* 11/17/2015 0814   HCO3 33.0* 11/17/2015 0814   TCO2 34 11/17/2015 0814   ACIDBASEDEF 2.0 11/16/2015 1333   O2SAT 55.1 11/20/2015 0850   CBG (last 3)  No results for input(s): GLUCAP in the last 72 hours.  Assessment/Plan: S/P Procedure(s) (LRB): TRANSESOPHAGEAL ECHOCARDIOGRAM (TEE) (N/A) MINIMALLY INVASIVE MITRAL VALVE (MV) REPLACEMENT (Right)  He remains  hemodynamically stable in sinus rhythm. Brief atrial fib converted after amiodarone.  ATN post cardiac arrest. Creatinine improving daily and good diuresis. Lasix changed to 80 bid by DB.  INR 2 so will stop heparin  Transfer to 2W stepdown.  Continue ambulation and IS.     LOS: 11 days    Gaye Pollack 11/24/2015

## 2015-11-24 NOTE — Progress Notes (Signed)
ANTICOAGULATION CONSULT NOTE - Follow Up Consult  Pharmacy Consult for Heparin Indication: MVR  Allergies  Allergen Reactions  . Iohexol Hives and Itching    Patient needs 13 hour prep per Dr. Pascal Lux, after cath he broke out in hives and had itching    Patient Measurements: Height: 5\' 8"  (172.7 cm) Weight: 231 lb 11.3 oz (105.1 kg) IBW/kg (Calculated) : 68.4  Vital Signs: Temp: 98.6 F (37 C) (12/26 0730) Temp Source: Oral (12/26 0730) BP: 101/69 mmHg (12/26 0700) Pulse Rate: 118 (12/26 0700)  Labs:  Recent Labs  11/22/15 0415  11/23/15 0412 11/23/15 1630 11/24/15 0105 11/24/15 0449  HGB 9.9*  --  10.8*  --   --  9.5*  HCT 29.0*  --  31.4*  --   --  28.9*  PLT 209  --  308  --   --  298  LABPROT 20.0*  --  21.0*  --   --  22.5*  INR 1.71*  --  1.82*  --   --  1.99*  HEPARINUNFRC 0.34  < > 0.27* 0.35 0.46 0.41  CREATININE 7.93*  --  7.63*  --   --  6.83*  < > = values in this interval not displayed.  Estimated Creatinine Clearance: 15.4 mL/min (by C-G formula based on Cr of 6.83).   Assessment: Patient admitted for mechanical MVR. Currently on heparin/bridge to warfarin. HL slightly above lower goal at 0.41 but since INR today has trended up to 1.99, will d/c heparin gtt per discussion with Dr. Cyndia Bent. Hgb remains low with slight trend down to 9.5, plt remain wnl and stable with no significant s/s bleeding reported.  Goal of Therapy:  Heparin level 0.3-0.35 (per MD) Monitor platelets by anticoagulation protocol: Yes   Plan:  -Discontinue heparin gtt -Warfarin 5mg  PO daily per MD -Daily INR, CBC and HL  Mazey Mantell K. Velva Harman, PharmD, BCPS, CPP Clinical Pharmacist Pager: [phone] Phone: [phone] 11/24/2015 8:30 AM

## 2015-11-24 NOTE — Plan of Care (Signed)
Problem: Phase III - Recovery through Discharge Goal: Discharge plan remains appropriate-arrangements made Outcome: Progressing Care manager working with family to plan for care at home after discharge

## 2015-11-24 NOTE — Progress Notes (Signed)
Subjective:   Feels good. Sitting up in chair. Back in NSR after amio started yesterday. Creatinine improving. Weight stable on daily IV lasix.     Intake/Output Summary (Last 24 hours) at 11/24/15 0923 Last data filed at 11/24/15 0600  Gross per 24 hour  Intake 1155.5 ml  Output   3400 ml  Net -2244.5 ml    Current meds: . amiodarone  200 mg Oral Q12H   Followed by  . [START ON 12/01/2015] amiodarone  200 mg Oral Daily  . aspirin EC  81 mg Oral Daily  . bisacodyl  10 mg Oral Daily   Or  . bisacodyl  10 mg Rectal Daily  . furosemide  120 mg Intravenous Daily  . guaiFENesin  600 mg Oral BID  . pantoprazole  40 mg Oral Daily  . sodium chloride  10-40 mL Intracatheter Q12H  . sodium chloride  3 mL Intravenous Q12H  . warfarin  5 mg Oral q1800  . Warfarin - Physician Dosing Inpatient   Does not apply q1800   Infusions: . sodium chloride Stopped (11/20/15 1400)  . sodium chloride Stopped (11/20/15 0500)     Objective:  Blood pressure 101/69, pulse 118, temperature 98.6 F (37 C), temperature source Oral, resp. rate 12, height 5\' 8"  (1.727 m), weight 105.1 kg (231 lb 11.3 oz), SpO2 95 %. Weight change: -0.9 kg (-1 lb 15.7 oz)  Physical Exam: General:  Sitting in chair HEENT: normal Neck: supple. JVP 9-10 Carotids 2+ bilat; no bruits. No lymphadenopathy or thryomegaly appreciated. Cor: PMI nondisplaced. Regular rate & rhythm. Mechanical s1 Lungs: clear Abdomen: soft, nontender, nondistended. No hepatosplenomegaly. No bruits or masses. Good bowel sounds. Extremities: no cyanosis, clubbing, rash, 2+  edema L>> R Neuro: alert and oriented x 3 cranial nerves grossly intact. moves all 4 extremities w/o difficulty. Affect pleasant  No asterixis  Telemetry: NSR/sinus brady 50-60s  Lab Results: Basic Metabolic Panel:  Recent Labs Lab 11/18/15 0400  11/19/15 0308 11/20/15 0716 11/21/15 0413 11/22/15 0415 11/23/15 0412 11/24/15 0449  NA  --   < > 130* 129* 129* 130*  130* 133*  K  --   < > 5.1 4.8 4.3 3.9 3.7 4.2  CL  --   < > 90* 90* 89* 90* 89* 91*  CO2  --   < > 28 26 27 28 27 30   GLUCOSE  --   < > 103* 87 94 99 101* 102*  BUN  --   < > 52* 62* 66* 68* 66* 63*  CREATININE  --   < > 5.27* 6.52* 7.00* 7.93* 7.63* 6.83*  CALCIUM  --   < > 8.5* 8.4* 8.5* 8.7* 8.9 8.9  MG 2.4  --  2.5* 2.5* 2.5*  --   --   --   PHOS 5.5*  --  6.8* 7.3* 7.1*  --   --   --   < > = values in this interval not displayed. Liver Function Tests:  Recent Labs Lab 11/18/15 0400  AST 319*  ALT 325*  ALKPHOS 326*  BILITOT 0.6  PROT 5.0*  ALBUMIN 2.1*   No results for input(s): LIPASE, AMYLASE in the last 168 hours. No results for input(s): AMMONIA in the last 168 hours. CBC:  Recent Labs Lab 11/20/15 0716 11/21/15 0413 11/22/15 0415 11/23/15 0412 11/24/15 0449  WBC 6.7 6.6 7.8 9.0 8.7  HGB 10.4* 10.0* 9.9* 10.8* 9.5*  HCT 30.7* 29.9* 29.0* 31.4* 28.9*  MCV 88.0 87.4 87.3  86.7 87.6  PLT 172 196 209 308 298   Cardiac Enzymes:  Recent Labs Lab 11/18/15 0400  TROPONINI 50.54*   BNP: Invalid input(s): POCBNP CBG:  Recent Labs Lab 11/19/15 1210 11/19/15 1549 11/20/15 0827 11/20/15 1151 11/20/15 1610  GLUCAP 76 75 74 99 86   Microbiology: Lab Results  Component Value Date   CULT NO GROWTH 5 DAYS 05/29/2015   CULT NO GROWTH 5 DAYS 05/29/2015   No results for input(s): CULT, SDES in the last 168 hours.  Imaging: No results found.   ASSESSMENT:  1. Cardiac arrest VT/VF - 11/16/15 2. 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 Cardiogenic shock s/p cardiac arrest 4. Probable ischemic cardiomyopathy EF 25-30% -> 55% 5. AKI 6. Shock liver 7. Acute respiratory failure due to #1 - resolved 8. CHB, resolved 9. New onset AF, 12/25 10. Hypokalemia  PLAN/DISCUSSION:  Clinical data supports that he likely had embolus down RCA resulting in VF arrest. However EF now normal on echo.   Creatinine now improving. Good  urine output. Still volume overloaded. Will change lasix to 80 IV bid. Place TED hose. Watch for post ATN diuresis.  K+ off.   Back in NSR on amio 200 bid. Watch closely for heart block.   INR 1.99. Continue heparin. Loading coumadin. Plan cath as outpatient   Likely can go to the floor. Will have cardiac rehab see.    LOS: 11 days  Glori Bickers, MD 11/24/2015, 9:23 AM

## 2015-11-25 LAB — BASIC METABOLIC PANEL
Anion gap: 12 (ref 5–15)
BUN: 61 mg/dL — AB (ref 6–20)
CALCIUM: 9.2 mg/dL (ref 8.9–10.3)
CHLORIDE: 91 mmol/L — AB (ref 101–111)
CO2: 30 mmol/L (ref 22–32)
CREATININE: 5.77 mg/dL — AB (ref 0.61–1.24)
GFR calc Af Amer: 12 mL/min — ABNORMAL LOW (ref >60)
GFR, EST NON AFRICAN AMERICAN: 10 mL/min — AB (ref >60)
Glucose, Bld: 112 mg/dL — ABNORMAL HIGH (ref 65–99)
Potassium: 4.5 mmol/L (ref 3.5–5.1)
SODIUM: 133 mmol/L — AB (ref 135–145)

## 2015-11-25 LAB — HEPARIN LEVEL (UNFRACTIONATED): HEPARIN UNFRACTIONATED: 0.27 [IU]/mL — AB (ref 0.30–0.70)

## 2015-11-25 LAB — PROTIME-INR
INR: 1.73 — AB (ref 0.00–1.49)
PROTHROMBIN TIME: 20.2 s — AB (ref 11.6–15.2)

## 2015-11-25 MED ORDER — HEPARIN (PORCINE) IN NACL 100-0.45 UNIT/ML-% IJ SOLN
1950.0000 [IU]/h | INTRAMUSCULAR | Status: DC
  Administered 2015-11-25: 2050 [IU]/h via INTRAVENOUS
  Administered 2015-11-26 (×2): 2150 [IU]/h via INTRAVENOUS
  Administered 2015-11-27 – 2015-11-29 (×4): 1950 [IU]/h via INTRAVENOUS
  Filled 2015-11-25 (×9): qty 250

## 2015-11-25 MED ORDER — METOLAZONE 2.5 MG PO TABS
2.5000 mg | ORAL_TABLET | Freq: Once | ORAL | Status: AC
  Administered 2015-11-25: 2.5 mg via ORAL
  Filled 2015-11-25: qty 1

## 2015-11-25 MED ORDER — WHITE PETROLATUM GEL
Status: AC
  Administered 2015-11-25: 13:00:00
  Filled 2015-11-25: qty 1

## 2015-11-25 MED ORDER — WHITE PETROLATUM GEL: 1.0000 "application " | Status: DC | PRN

## 2015-11-25 MED ORDER — WARFARIN SODIUM 7.5 MG PO TABS
7.5000 mg | ORAL_TABLET | Freq: Every day | ORAL | Status: DC
  Administered 2015-11-25 – 2015-11-26 (×2): 7.5 mg via ORAL
  Filled 2015-11-25 (×2): qty 1

## 2015-11-25 NOTE — Progress Notes (Signed)
UR Completed. Calyx Hawker, RN, BSN.  [phone] 

## 2015-11-25 NOTE — Progress Notes (Signed)
ANTICOAGULATION CONSULT NOTE - Follow Up Consult  Pharmacy Consult for Heparin Indication: MVR  Allergies  Allergen Reactions  . Iohexol Hives and Itching    Patient needs 13 hour prep per Dr. Pascal Lux, after cath he broke out in hives and had itching    Patient Measurements: Height: 5\' 8"  (172.7 cm) Weight: 229 lb 8 oz (104.101 kg) IBW/kg (Calculated) : 68.4  Vital Signs: Temp: 98.3 F (36.8 C) (12/27 0527) Temp Source: Oral (12/27 0527) BP: 101/58 mmHg (12/27 0527) Pulse Rate: 58 (12/27 0527)  Labs:  Recent Labs  11/23/15 0412 11/23/15 1630 11/24/15 0105 11/24/15 0449 11/25/15 0357  HGB 10.8*  --   --  9.5*  --   HCT 31.4*  --   --  28.9*  --   PLT 308  --   --  298  --   LABPROT 21.0*  --   --  22.5* 20.2*  INR 1.82*  --   --  1.99* 1.73*  HEPARINUNFRC 0.27* 0.35 0.46 0.41  --   CREATININE 7.63*  --   --  6.83* 5.77*    Estimated Creatinine Clearance: 18.1 mL/min (by C-G formula based on Cr of 5.77).   Assessment: Patient admitted for mechanical MVR.  He was on heparin for bridge therapy which was stopped 12/26 w/ INR 1.99 but now to resume today with no bolus due to INR drop to 1.73. MD dosing coumadin, dose increased today. No significant s/s bleeding reported.  Goal of Therapy:  Heparin level 0.3-0.7 Monitor platelets by anticoagulation protocol: Yes   Plan:  -resume heparin at previous therapeutic rate of 2050 units/hr and check HL in 8 hrs -Warfarin increased to 7.5mg  PO daily per MD -Daily INR, CBC and HL  Eudelia Bunch, Pharm.D. [id number] 11/25/2015 10:01 AM

## 2015-11-25 NOTE — Progress Notes (Signed)
SUBJECTIVE: The patient is doing well today,  family at bedside.  He is now on 2W, he denies chest pain or shortness of breath, reports ambulating in the hallway without difficulty.  Marland Kitchen amiodarone  200 mg Oral Q12H   Followed by  . [START ON 12/01/2015] amiodarone  200 mg Oral Daily  . aspirin EC  81 mg Oral Daily  . docusate sodium  200 mg Oral Daily  . famotidine  20 mg Oral Q supper  . furosemide  80 mg Intravenous BID  . metolazone  2.5 mg Oral Once  . moving right along book   Does not apply Once  . sodium chloride  3 mL Intravenous Q12H  . warfarin  7.5 mg Oral q1800  . Warfarin - Physician Dosing Inpatient   Does not apply q1800   . heparin      OBJECTIVE: Physical Exam: Filed Vitals:   11/24/15 1500 11/24/15 1904 11/24/15 2051 11/25/15 0527  BP: 117/72 111/51 93/54 101/58  Pulse: 57 58 54 58  Temp:  98.7 F (37.1 C) 99.4 F (37.4 C) 98.3 F (36.8 C)  TempSrc:  Oral Oral Oral  Resp: 11 18 18 18   Height:      Weight:    229 lb 8 oz (104.101 kg)  SpO2: 100% 100%  95%    Intake/Output Summary (Last 24 hours) at 11/25/15 0932 Last data filed at 11/25/15 [id number]  Gross per 24 hour  Intake 894.67 ml  Output   3375 ml  Net -2480.33 ml    Telemetry reveals currently  SR, 1st degree AVblock, no PAF in last 24 hours  GEN- The appears well, alert and oriented x 3 today Head- normocephalic, atraumatic Eyes-  Sclera clear, conjunctiva pink Ears- hearing intact Oropharynx- clear Neck- supple with no JVD Lungs- Clear to ausculation bilaterally, normal work of breathing Heart- Regular rate and rhythm, mechanical S1, no significant murmurs, no rubs or gallops GI- soft, NT, ND Extremities- no clubbing, cyanosis, + edema (improving) Skin- no rash or lesion Psych- euthymic mood, full affect Neuro- no gross deficits appreciated  LABS: Basic Metabolic Panel:  Recent Labs  11/24/15 0449 11/25/15 0357  NA 133* 133*  K 4.2 4.5  CL 91* 91*  CO2 30 30  GLUCOSE 102*  112*  BUN 63* 61*  CREATININE 6.83* 5.77*  CALCIUM 8.9 9.2   Liver Function Tests: No results for input(s): AST, ALT, ALKPHOS, BILITOT, PROT, ALBUMIN in the last 72 hours. No results for input(s): LIPASE, AMYLASE in the last 72 hours. CBC:  Recent Labs  11/23/15 0412 11/24/15 0449  WBC 9.0 8.7  HGB 10.8* 9.5*  HCT 31.4* 28.9*  MCV 86.7 87.6  PLT 308 298    ASSESSMENT AND PLAN:  1. Severe MR  S/p MVR (mechanical)  POD # 12      On coumadin/heparin gtt, INR 1.73 today  2. Post-op CHB  He is in SR 1st degree AVblock in the 50-65bpm range typically in the 80's noted as well.       PAFib started on PO amiodarone 200mg  BID since 11/23/15, no AF noted in last 24 hours, no high degree AVBlock     Epicardial wires in place     Watch closely for heart block with amiodarone     May not need device, patient and family weary of this possibility  3. VF arrest 11/16/15  Prolonged event with multiple shocks, CPR, multiple meds  Post arrest echo 11/16/15 with EF 20-25%, +  WMA     TEE 11/17/15 EF = 35-40% with severe inferolateral HK, MITRAL VALVE: Mechanical valve. Normal function. No clot.   Mechanism suspect to be embolic, plan is to re-cath with cardiology when able.       Echo 11/20/15 with EF 55-60%, no WMA  4. Multisystem organ dysfunction, post code  extubated     Remains off pressors with stable BP  ARI, better again today with BUN 61, Creat 5.77 (peak was Creat 7.93 on 11/22/15), fluid negative last few days  Abn/elevated LFTs, were improving last eval     hyponatremia    Tommye Standard, PA-C 11/25/2015 9:32 AM   EP Attending  Patient seen and examined. Agree with the findings and documentation as noted above by Tommye Standard, PA-C. The patient continues to have a dramatic recovery with normalization of his LV Function, resolution of his CHB, s/p mitral valve surgery. He has first degree AV block. His kidney function has improved by still  severely impaired. He is tolerating amiodarone. Exam reveals a well appearing man, NAD with normal lung exam, RRR, no edema and tele demonstrating NSR.  A/P 1. VF arrest due to AMI 2. AMI secondary to embolic phenomena 3. Acute renal failure, now stage 5 due to prolonged code/ATN, etc.  4. Severe LV dysfunction, now with normalization. Rec: continue supportive care. He will continue amiodarone for now with plans to stop this medication in 2-4 weeks. Would repeat echo in 6 weeks. No indication at present for PPM or ICD with return of AV conduction, and normalization of LV function. There was a plan to repeat heart cath once renal function got back to baseline.   Mikle Bosworth.D.

## 2015-11-25 NOTE — Progress Notes (Signed)
CARDIAC REHAB PHASE I   PRE:  Rate/Rhythm: 63 SR    BP: sitting 98/66    SaO2: 98 RA  MODE:  Ambulation: 500 ft   POST:  Rate/Rhythm: 80 SR    BP: sitting 114/83     SaO2: 96 RA (difficult to register)  Slightly unsteady at first with shorter steps. Pt wearing flip flops and socks. Gait improved with distance. Denied fatigue. Sts he feels good. Family present and asking questions regarding ex at d/c and CRPII. Reviewed CRPII and will send referral to [place]. Gave brochure. Will f/u. [id number]   Oscar Brown [place] CES, ACSM 11/25/2015 10:20 AM

## 2015-11-25 NOTE — Progress Notes (Signed)
Subjective:   Feels good. Now on 2W. Walking laps.   Maintaining NSR on po amio. Diuresing well. Creatinine continues to improve.     Intake/Output Summary (Last 24 hours) at 11/25/15 0851 Last data filed at 11/25/15 0811  Gross per 24 hour  Intake 1206.17 ml  Output   3375 ml  Net -2168.83 ml    Current meds: . amiodarone  200 mg Oral Q12H   Followed by  . [START ON 12/01/2015] amiodarone  200 mg Oral Daily  . aspirin EC  81 mg Oral Daily  . docusate sodium  200 mg Oral Daily  . famotidine  20 mg Oral Q supper  . furosemide  80 mg Intravenous BID  . moving right along book   Does not apply Once  . sodium chloride  3 mL Intravenous Q12H  . warfarin  7.5 mg Oral q1800  . Warfarin - Physician Dosing Inpatient   Does not apply q1800   Infusions:     Objective:  Blood pressure 101/58, pulse 58, temperature 98.3 F (36.8 C), temperature source Oral, resp. rate 18, height 5\' 8"  (1.727 m), weight 104.101 kg (229 lb 8 oz), SpO2 95 %. Weight change: -1 kg (-2 lb 3.3 oz)  Physical Exam: General:  Sitting in chair HEENT: normal Neck: supple. JVP to jaw Carotids 2+ bilat; no bruits. No lymphadenopathy or thryomegaly appreciated. Cor: PMI nondisplaced. Regular rate & rhythm. Mechanical s1 Lungs: clear Abdomen: soft, nontender, nondistended. No hepatosplenomegaly. No bruits or masses. Good bowel sounds. Extremities: no cyanosis, clubbing, rash, 1-2+  edema L>> R Neuro: alert and oriented x 3 cranial nerves grossly intact. moves all 4 extremities w/o difficulty. Affect pleasant  No asterixis  Telemetry: NSR/sinus brady 50-60s  Lab Results: Basic Metabolic Panel:  Recent Labs Lab 11/19/15 0308 11/20/15 0716 11/21/15 0413 11/22/15 0415 11/23/15 0412 11/24/15 0449 11/25/15 0357  NA 130* 129* 129* 130* 130* 133* 133*  K 5.1 4.8 4.3 3.9 3.7 4.2 4.5  CL 90* 90* 89* 90* 89* 91* 91*  CO2 28 26 27 28 27 30 30   GLUCOSE 103* 87 94 99 101* 102* 112*  BUN 52* 62* 66* 68* 66*  63* 61*  CREATININE 5.27* 6.52* 7.00* 7.93* 7.63* 6.83* 5.77*  CALCIUM 8.5* 8.4* 8.5* 8.7* 8.9 8.9 9.2  MG 2.5* 2.5* 2.5*  --   --   --   --   PHOS 6.8* 7.3* 7.1*  --   --   --   --    Liver Function Tests: No results for input(s): AST, ALT, ALKPHOS, BILITOT, PROT, ALBUMIN in the last 168 hours. No results for input(s): LIPASE, AMYLASE in the last 168 hours. No results for input(s): AMMONIA in the last 168 hours. CBC:  Recent Labs Lab 11/20/15 0716 11/21/15 0413 11/22/15 0415 11/23/15 0412 11/24/15 0449  WBC 6.7 6.6 7.8 9.0 8.7  HGB 10.4* 10.0* 9.9* 10.8* 9.5*  HCT 30.7* 29.9* 29.0* 31.4* 28.9*  MCV 88.0 87.4 87.3 86.7 87.6  PLT 172 196 209 308 298   Cardiac Enzymes: No results for input(s): CKTOTAL, CKMB, CKMBINDEX, TROPONINI in the last 168 hours. BNP: Invalid input(s): POCBNP CBG:  Recent Labs Lab 11/19/15 1210 11/19/15 1549 11/20/15 0827 11/20/15 1151 11/20/15 1610  GLUCAP 76 75 74 99 86   Microbiology: Lab Results  Component Value Date   CULT NO GROWTH 5 DAYS 05/29/2015   CULT NO GROWTH 5 DAYS 05/29/2015   No results for input(s): CULT, SDES in the last 168  hours.  Imaging: No results found.   ASSESSMENT:  1. Cardiac arrest VT/VF - 11/16/15 2. 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 Cardiogenic shock s/p cardiac arrest 4. Probable ischemic cardiomyopathy EF 25-30% -> 55% 5. AKI 6. Shock liver 7. Acute respiratory failure due to #1 - resolved 8. CHB, resolved 9. New onset AF, 12/25 10. Hypokalemia  PLAN/DISCUSSION:  Clinical data supports that he likely had embolus down RCA resulting in VF arrest. However EF now normal on echo.   Creatinine now improving. Good urine output. Still volume overloaded. Will continue lasix 80 IV bid. Give one dose metolazone.   Back in NSR on amio 200 bid. Watch closely for heart block.   INR 1.73 Would restart heparin. Loading coumadin. Plan cath as outpatient   Cardiac rehab to  see. If INR OK and volume status improved may be able to go home by Thursday   LOS: 12 days  Glori Bickers, MD 11/25/2015, 8:51 AM

## 2015-11-25 NOTE — Progress Notes (Signed)
Pt ambulated 550 ft independently. Pt tolerated well. No complaints of pain or shortness of breath.  Raliegh Ip RN

## 2015-11-25 NOTE — Progress Notes (Addendum)
12 Days Post-Op Procedure(s) (LRB): TRANSESOPHAGEAL ECHOCARDIOGRAM (TEE) (N/A) MINIMALLY INVASIVE MITRAL VALVE (MV) REPLACEMENT (Right) Subjective: Feels well, walked 4 laps this am without walker  Objective: Vital signs in last 24 hours: Temp:  [98.3 F (36.8 C)-99.4 F (37.4 C)] 98.3 F (36.8 C) (12/27 0527) Pulse Rate:  [54-58] 58 (12/27 0527) Cardiac Rhythm:  [-] Heart block (12/27 0700) Resp:  [9-18] 18 (12/27 0527) BP: (93-119)/(51-72) 101/58 mmHg (12/27 0527) SpO2:  [95 %-100 %] 95 % (12/27 0527) Weight:  [229 lb 8 oz (104.101 kg)] 229 lb 8 oz (104.101 kg) (12/27 0527)  Hemodynamic parameters for last 24 hours: CVP:  [7 mmHg-10 mmHg] 8 mmHg  Intake/Output from previous day: 12/26 0701 - 12/27 0700 In: 1206.2 [P.O.:1030; I.V.:176.2] Out: 2675 [Urine:2675] Intake/Output this shift:    General appearance: alert, cooperative and no distress Heart: regular rate and rhythm and no murmur Lungs: dim in bases Abdomen: benign Extremities: + LE edema Wound: incis healing well  Lab Results:   Recent Labs  11/23/15 0412 11/24/15 0449  WBC 9.0 8.7  HGB 10.8* 9.5*  HCT 31.4* 28.9*  PLT 308 298   BMET:  Recent Labs  11/24/15 0449 11/25/15 0357  NA 133* 133*  K 4.2 4.5  CL 91* 91*  CO2 30 30  GLUCOSE 102* 112*  BUN 63* 61*  CREATININE 6.83* 5.77*  CALCIUM 8.9 9.2    PT/INR:  Recent Labs  11/25/15 0357  LABPROT 20.2*  INR 1.73*   ABG    Component Value Date/Time   PHART 7.596* 11/17/2015 0814   HCO3 33.0* 11/17/2015 0814   TCO2 34 11/17/2015 0814   ACIDBASEDEF 2.0 11/16/2015 1333   O2SAT 55.1 11/20/2015 0850   CBG (last 3)  No results for input(s): GLUCAP in the last 72 hours.  Meds Scheduled Meds: . amiodarone  200 mg Oral Q12H   Followed by  . [START ON 12/01/2015] amiodarone  200 mg Oral Daily  . aspirin EC  81 mg Oral Daily  . docusate sodium  200 mg Oral Daily  . famotidine  20 mg Oral Q supper  . furosemide  80 mg Intravenous BID  .  moving right along book   Does not apply Once  . sodium chloride  3 mL Intravenous Q12H  . warfarin  5 mg Oral q1800  . Warfarin - Physician Dosing Inpatient   Does not apply q1800   Continuous Infusions:  PRN Meds:.sodium chloride, acetaminophen, bisacodyl **OR** bisacodyl, ondansetron **OR** ondansetron (ZOFRAN) IV, oxyCODONE, sodium chloride, sodium chloride, traMADol  Xrays No results found.  Assessment/Plan: S/P Procedure(s) (LRB): TRANSESOPHAGEAL ECHOCARDIOGRAM (TEE) (N/A) MINIMALLY INVASIVE MITRAL VALVE (MV) REPLACEMENT (Right)  1 doing well 2 creat conts to improve 3 sinus rhythm/sinus brady- on amio, QTc 427- monitor on current dose 4 INR 1.73, may need to increase dose of coumadin 5 cont diuretics - CHF team managing  LOS: 12 days    GOLD,WAYNE E 11/25/2015  Patient seen and examined, agree with above  Remo Lipps C. Roxan Hockey, MD [facility] [phone]

## 2015-11-25 NOTE — Progress Notes (Signed)
ANTICOAGULATION CONSULT NOTE - Follow Up Consult  Pharmacy Consult for Heparin Indication: MVR  Allergies  Allergen Reactions  . Iohexol Hives and Itching    Patient needs 13 hour prep per Dr. Pascal Lux, after cath he broke out in hives and had itching    Patient Measurements: Height: 5\' 8"  (172.7 cm) Weight: 229 lb 8 oz (104.101 kg) IBW/kg (Calculated) : 68.4  Heparin dosing wt: 90kg  Vital Signs: Temp: 97.8 F (36.6 C) (12/27 1300) Temp Source: Oral (12/27 1300) BP: 102/56 mmHg (12/27 1300) Pulse Rate: 54 (12/27 1300) 85 Labs:  Recent Labs  11/23/15 0412  11/24/15 0105 11/24/15 0449 11/25/15 0357 11/25/15 1915  HGB 10.8*  --   --  9.5*  --   --   HCT 31.4*  --   --  28.9*  --   --   PLT 308  --   --  298  --   --   LABPROT 21.0*  --   --  22.5* 20.2*  --   INR 1.82*  --   --  1.99* 1.73*  --   HEPARINUNFRC 0.27*  < > 0.46 0.41  --  0.27*  CREATININE 7.63*  --   --  6.83* 5.77*  --   < > = values in this interval not displayed.  Estimated Creatinine Clearance: 18.1 mL/min (by C-G formula based on Cr of 5.77).   Assessment: Patient admitted for mechanical MVR.  He was on heparin for bridge therapy which was stopped 12/26 w/ INR 1.99 but now resumed (with no bolus) due to INR drop to 1.73.  -Heparin level 0.27 on 2050 units/hr  Goal of Therapy:  Heparin level 0.3-0.7 Monitor platelets by anticoagulation protocol: Yes   Plan:  -Increase heparin to 2150 units/hr -Heparin level and CBC in am  Hildred Laser, Pharm D 11/25/2015 8:37 PM

## 2015-11-26 DIAGNOSIS — I340 Nonrheumatic mitral (valve) insufficiency: Principal | ICD-10-CM

## 2015-11-26 LAB — CBC
HCT: 28.6 % — ABNORMAL LOW (ref 39.0–52.0)
HEMOGLOBIN: 9.4 g/dL — AB (ref 13.0–17.0)
MCH: 28.9 pg (ref 26.0–34.0)
MCHC: 32.9 g/dL (ref 30.0–36.0)
MCV: 88 fL (ref 78.0–100.0)
Platelets: 387 10*3/uL (ref 150–400)
RBC: 3.25 MIL/uL — AB (ref 4.22–5.81)
RDW: 13.3 % (ref 11.5–15.5)
WBC: 9.7 10*3/uL (ref 4.0–10.5)

## 2015-11-26 LAB — PROTIME-INR
INR: 1.69 — AB (ref 0.00–1.49)
PROTHROMBIN TIME: 19.9 s — AB (ref 11.6–15.2)

## 2015-11-26 LAB — BASIC METABOLIC PANEL
ANION GAP: 12 (ref 5–15)
BUN: 57 mg/dL — ABNORMAL HIGH (ref 6–20)
CALCIUM: 9.4 mg/dL (ref 8.9–10.3)
CO2: 30 mmol/L (ref 22–32)
Chloride: 91 mmol/L — ABNORMAL LOW (ref 101–111)
Creatinine, Ser: 5.1 mg/dL — ABNORMAL HIGH (ref 0.61–1.24)
GFR, EST AFRICAN AMERICAN: 14 mL/min — AB (ref >60)
GFR, EST NON AFRICAN AMERICAN: 12 mL/min — AB (ref >60)
Glucose, Bld: 97 mg/dL (ref 65–99)
Potassium: 4.8 mmol/L (ref 3.5–5.1)
SODIUM: 133 mmol/L — AB (ref 135–145)

## 2015-11-26 LAB — HEPARIN LEVEL (UNFRACTIONATED): Heparin Unfractionated: 0.56 IU/mL (ref 0.30–0.70)

## 2015-11-26 MED ORDER — METOLAZONE 2.5 MG PO TABS
2.5000 mg | ORAL_TABLET | Freq: Once | ORAL | Status: AC
  Administered 2015-11-26: 2.5 mg via ORAL
  Filled 2015-11-26: qty 1

## 2015-11-26 NOTE — Progress Notes (Signed)
Advanced Heart Failure Progress Note Subjective:   S/p Minimally invasive MVR 11/13/15 by Dr Roxy Manns.  Continues to improve. Walking laps around unit.  No CP or SOB.   Remains in NSR on po amio. Out > 4L and down 7 lbs.   INR 1.69. BMET pending.   Intake/Output Summary (Last 24 hours) at 11/26/15 0743 Last data filed at 11/26/15 0616  Gross per 24 hour  Intake  497.7 ml  Output   5250 ml  Net -4752.3 ml    Current meds: . amiodarone  200 mg Oral Q12H   Followed by  . [START ON 12/01/2015] amiodarone  200 mg Oral Daily  . aspirin EC  81 mg Oral Daily  . docusate sodium  200 mg Oral Daily  . famotidine  20 mg Oral Q supper  . furosemide  80 mg Intravenous BID  . moving right along book   Does not apply Once  . sodium chloride  3 mL Intravenous Q12H  . warfarin  7.5 mg Oral q1800  . Warfarin - Physician Dosing Inpatient   Does not apply q1800   Infusions: . heparin 2,150 Units/hr (11/25/15 2058)     Objective:  Blood pressure 92/47, pulse 53, temperature 97.7 F (36.5 C), temperature source Oral, resp. rate 18, height 5\' 8"  (1.727 m), weight 222 lb 7.1 oz (100.9 kg), SpO2 93 %. Weight change: -7 lb 0.9 oz (-3.2 kg)  Physical Exam: General:  Sitting in chair HEENT: normal Neck: supple. JVP 10-12 cm. Carotids 2+ bilat; no bruits. No thyromegaly or nodule noted Cor: PMI nondisplaced. RRR. Mechanical s1 Lungs: CTAB, normal effort Abdomen: soft, NT, ND, no HSM. No bruits or masses. +BS  Extremities: no cyanosis, clubbing, rash, 1-2+ into thighs edema L>> R Neuro: alert and oriented x 3 cranial nerves grossly intact. moves all 4 extremities w/o difficulty. Affect pleasant  No asterixis  Telemetry: Reviewed personally, NSR/sinus brady 50-60s  Lab Results: Basic Metabolic Panel:  Recent Labs Lab 11/20/15 0716 11/21/15 0413 11/22/15 0415 11/23/15 0412 11/24/15 0449 11/25/15 0357  NA 129* 129* 130* 130* 133* 133*  K 4.8 4.3 3.9 3.7 4.2 4.5  CL 90* 89* 90* 89* 91*  91*  CO2 26 27 28 27 30 30   GLUCOSE 87 94 99 101* 102* 112*  BUN 62* 66* 68* 66* 63* 61*  CREATININE 6.52* 7.00* 7.93* 7.63* 6.83* 5.77*  CALCIUM 8.4* 8.5* 8.7* 8.9 8.9 9.2  MG 2.5* 2.5*  --   --   --   --   PHOS 7.3* 7.1*  --   --   --   --    Liver Function Tests: No results for input(s): AST, ALT, ALKPHOS, BILITOT, PROT, ALBUMIN in the last 168 hours. No results for input(s): LIPASE, AMYLASE in the last 168 hours. No results for input(s): AMMONIA in the last 168 hours. CBC:  Recent Labs Lab 11/21/15 0413 11/22/15 0415 11/23/15 0412 11/24/15 0449 11/26/15 0400  WBC 6.6 7.8 9.0 8.7 9.7  HGB 10.0* 9.9* 10.8* 9.5* 9.4*  HCT 29.9* 29.0* 31.4* 28.9* 28.6*  MCV 87.4 87.3 86.7 87.6 88.0  PLT 196 209 308 298 387   Cardiac Enzymes: No results for input(s): CKTOTAL, CKMB, CKMBINDEX, TROPONINI in the last 168 hours. BNP: Invalid input(s): POCBNP CBG:  Recent Labs Lab 11/19/15 1210 11/19/15 1549 11/20/15 0827 11/20/15 1151 11/20/15 1610  GLUCAP 76 75 74 99 86   Microbiology: Lab Results  Component Value Date   CULT NO GROWTH 5 DAYS 05/29/2015  CULT NO GROWTH 5 DAYS 05/29/2015   No results for input(s): CULT, SDES in the last 168 hours.  Imaging: No results found.   ASSESSMENT:  1. Cardiac arrest VT/VF - 11/16/15 2. 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 Cardiogenic shock s/p cardiac arrest 4. Probable ischemic cardiomyopathy EF 25-30% -> 55% 5. AKI 6. Shock liver 7. Acute respiratory failure due to #1 - resolved 8. CHB, resolved 9. New onset AF, 12/25 10. Hypokalemia  PLAN/DISCUSSION:  Clinical data supports embolus down RCA resulting in VF arrest, however, EF 5560% on Echo 11/20/15    Continues to diurese vigorously. BMET bending. He remains volume overloaded. Continue lasix 80 IV bid and repeat  metolazone. Weight 209 in October. Likely 1-2 days further IV diuresis.   Maintaining NSR on amio 200 bid. Monitor closely for  heart block.   INR 1.69 . Heparin and coumadin per pharmacy.   Cardiac rehab saw 11/25/15 Sent referal to [place]. Disposition pending volume correction and therapeutic INR.    LOS: 13 days    Shirley Friar PA-C 11/26/2015  Advanced Heart Failure Team Pager [phone] (M-F; 7a - 4p)  Please contact [facility] for night-coverage after hours (4p -7a ) and weekends on [email or web address]  Patient seen and examined with Oda Kilts, PA-C. We discussed all aspects of the encounter. I agree with the assessment and plan as stated above.   Continues to improve. Diuresing well. Likely one more day of IV diureses. Will likely need K supp.   Remains in NSR on amio. No heart block.   Continue heparin until INR ~ 2.0. CR has seen.   Hopefully home soon when INR therapeutic. Currently no need for PPM or ICD.   We will follow in [facility].  Bensimhon, Daniel,MD 8:56 AM

## 2015-11-26 NOTE — Progress Notes (Addendum)
[address]       [place] [address]             [phone]      13 Days Post-Op Procedure(s) (LRB): TRANSESOPHAGEAL ECHOCARDIOGRAM (TEE) (N/A) MINIMALLY INVASIVE MITRAL VALVE (MV) REPLACEMENT (Right) Subjective: conts to feel better overall, excellent diuresis  Objective: Vital signs in last 24 hours: Temp:  [97.7 F (36.5 C)-98.6 F (37 C)] 97.7 F (36.5 C) (12/28 0529) Pulse Rate:  [52-54] 53 (12/28 0529) Cardiac Rhythm:  [-] Sinus bradycardia;Heart block (12/27 1900) Resp:  [18-19] 18 (12/28 0529) BP: (92-106)/(47-60) 92/47 mmHg (12/28 0529) SpO2:  [93 %-100 %] 93 % (12/28 0529) Weight:  [222 lb 7.1 oz (100.9 kg)] 222 lb 7.1 oz (100.9 kg) (12/28 0650)  Hemodynamic parameters for last 24 hours:    Intake/Output from previous day: 12/27 0701 - 12/28 0700 In: 497.7 [P.O.:360; I.V.:137.7] Out: 5250 [Urine:5250] Intake/Output this shift:    General appearance: alert, cooperative and no distress Heart: regular rate and rhythm Lungs: fair air exchange throughout Abdomen: benign Extremities: + edema, improved Wound: incis healing well  Lab Results:  Recent Labs  11/24/15 0449 11/26/15 0400  WBC 8.7 9.7  HGB 9.5* 9.4*  HCT 28.9* 28.6*  PLT 298 387   BMET:  Recent Labs  11/24/15 0449 11/25/15 0357  NA 133* 133*  K 4.2 4.5  CL 91* 91*  CO2 30 30  GLUCOSE 102* 112*  BUN 63* 61*  CREATININE 6.83* 5.77*  CALCIUM 8.9 9.2    PT/INR:  Recent Labs  11/26/15 0357  LABPROT 19.9*  INR 1.69*   ABG    Component Value Date/Time   PHART 7.596* 11/17/2015 0814   HCO3 33.0* 11/17/2015 0814   TCO2 34 11/17/2015 0814   ACIDBASEDEF 2.0 11/16/2015 1333   O2SAT 55.1 11/20/2015 0850   CBG (last 3)  No results for input(s): GLUCAP in the last 72 hours.  Meds Scheduled Meds: . amiodarone  200 mg Oral Q12H   Followed by  . [START ON 12/01/2015] amiodarone  200 mg Oral Daily  . aspirin EC  81 mg Oral Daily  . docusate sodium  200 mg  Oral Daily  . famotidine  20 mg Oral Q supper  . furosemide  80 mg Intravenous BID  . moving right along book   Does not apply Once  . sodium chloride  3 mL Intravenous Q12H  . warfarin  7.5 mg Oral q1800  . Warfarin - Physician Dosing Inpatient   Does not apply q1800   Continuous Infusions: . heparin 2,150 Units/hr (11/25/15 2058)   PRN Meds:.sodium chloride, acetaminophen, bisacodyl **OR** bisacodyl, ondansetron **OR** ondansetron (ZOFRAN) IV, oxyCODONE, sodium chloride, sodium chloride, traMADol, white petrolatum  Xrays No results found.  Assessment/Plan: S/P Procedure(s) (LRB): TRANSESOPHAGEAL ECHOCARDIOGRAM (TEE) (N/A) MINIMALLY INVASIVE MITRAL VALVE (MV) REPLACEMENT (Right)  1 clinically conts to improve 2 no BMET today, will re check in am, recheck LFT's as well 3 INR down- back on heparin, cont 7.5 of coumadin another day, may need higher 4 EP and CHF team cont to assist with management. May need to decrease amio with QTc > 500 at times 5 H/H stable - no leukocytosis 6 still has EPW's, watching for heart block - will need to decide when safe to remove, on heparin and coumadin currently   LOS: 13 days    GOLD,WAYNE E 11/26/2015  Patient seen and examined, agree with above On heparin until INR > 2  May have to cut off EPW rather than pull them  Remo Lipps C. Roxan Hockey, MD [facility] [phone]

## 2015-11-26 NOTE — Progress Notes (Signed)
ANTICOAGULATION CONSULT NOTE - Follow Up Consult  Pharmacy Consult for Heparin Indication: MVR  Allergies  Allergen Reactions  . Iohexol Hives and Itching    Patient needs 13 hour prep per Dr. Pascal Lux, after cath he broke out in hives and had itching    Patient Measurements: Height: 5\' 8"  (172.7 cm) Weight: 222 lb 7.1 oz (100.9 kg) IBW/kg (Calculated) : 68.4  Vital Signs: Temp: 97.7 F (36.5 C) (12/28 0529) Temp Source: Oral (12/28 0529) BP: 99/57 mmHg (12/28 0806) Pulse Rate: 53 (12/28 0529)  Labs:  Recent Labs  11/24/15 0449 11/25/15 0357 11/25/15 1915 11/26/15 0357 11/26/15 0400  HGB 9.5*  --   --   --  9.4*  HCT 28.9*  --   --   --  28.6*  PLT 298  --   --   --  387  LABPROT 22.5* 20.2*  --  19.9*  --   INR 1.99* 1.73*  --  1.69*  --   HEPARINUNFRC 0.41  --  0.27* 0.56  --   CREATININE 6.83* 5.77*  --  5.10*  --     Estimated Creatinine Clearance: 20.2 mL/min (by C-G formula based on Cr of 5.1).   Assessment: Patient admitted for mechanical MVR.  He was on heparin for bridge therapy which was stopped 12/26 w/ INR 1.99 but heparin was resumed 12/27 with no bolus due to INR drop to 1.73. TCTS MD dosing coumadin, dose increased yesterday to 7.5 qday. No significant s/s bleeding reported. HL is therapeutic at 0.56 on heparin 2150 units/hr  Goal of Therapy:  Heparin level 0.3-0.7 Monitor platelets by anticoagulation protocol: Yes   Plan:  -continue heparin at 2150 units/hr  -Warfarin 7.5mg  PO daily per TCTS MD -Daily INR, CBC and HL  Eudelia Bunch, Pharm.D. BP:[id number] 11/26/2015 2:22 PM

## 2015-11-26 NOTE — Progress Notes (Signed)
CARDIAC REHAB PHASE I   PRE:  Rate/Rhythm: 59 SB    BP: sitting 95/58    SaO2: 97 RA  MODE:  Ambulation: 1040 ft   POST:  Rate/Rhythm: 77 SR    BP: sitting 119/79     SaO2: 99 RA  Tolerated very well. Steady. No c/o. Elevated feet after walk in recliner. Inspiring 1500 mL consistently on IS. Encouraged more walking today. [id number]   Josephina Shih [place] CES, ACSM 11/26/2015 11:09 AM

## 2015-11-27 ENCOUNTER — Ambulatory Visit: Payer: Managed Care, Other (non HMO) | Admitting: Cardiovascular Disease

## 2015-11-27 LAB — CBC
HEMATOCRIT: 32.1 % — AB (ref 39.0–52.0)
HEMOGLOBIN: 10.4 g/dL — AB (ref 13.0–17.0)
MCH: 28.8 pg (ref 26.0–34.0)
MCHC: 32.4 g/dL (ref 30.0–36.0)
MCV: 88.9 fL (ref 78.0–100.0)
Platelets: 518 10*3/uL — ABNORMAL HIGH (ref 150–400)
RBC: 3.61 MIL/uL — ABNORMAL LOW (ref 4.22–5.81)
RDW: 13.3 % (ref 11.5–15.5)
WBC: 11.7 10*3/uL — ABNORMAL HIGH (ref 4.0–10.5)

## 2015-11-27 LAB — COMPREHENSIVE METABOLIC PANEL
ALBUMIN: 3 g/dL — AB (ref 3.5–5.0)
ALK PHOS: 155 U/L — AB (ref 38–126)
ALT: 37 U/L (ref 17–63)
AST: 16 U/L (ref 15–41)
Anion gap: 14 (ref 5–15)
BILIRUBIN TOTAL: 0.6 mg/dL (ref 0.3–1.2)
BUN: 53 mg/dL — AB (ref 6–20)
CALCIUM: 9.8 mg/dL (ref 8.9–10.3)
CO2: 29 mmol/L (ref 22–32)
CREATININE: 4.2 mg/dL — AB (ref 0.61–1.24)
Chloride: 91 mmol/L — ABNORMAL LOW (ref 101–111)
GFR calc Af Amer: 18 mL/min — ABNORMAL LOW (ref >60)
GFR, EST NON AFRICAN AMERICAN: 15 mL/min — AB (ref >60)
GLUCOSE: 101 mg/dL — AB (ref 65–99)
POTASSIUM: 4.6 mmol/L (ref 3.5–5.1)
Sodium: 134 mmol/L — ABNORMAL LOW (ref 135–145)
TOTAL PROTEIN: 7 g/dL (ref 6.5–8.1)

## 2015-11-27 LAB — PROTIME-INR
INR: 1.57 — ABNORMAL HIGH (ref 0.00–1.49)
Prothrombin Time: 18.9 seconds — ABNORMAL HIGH (ref 11.6–15.2)

## 2015-11-27 LAB — HEPARIN LEVEL (UNFRACTIONATED)
HEPARIN UNFRACTIONATED: 0.49 [IU]/mL (ref 0.30–0.70)
Heparin Unfractionated: 0.8 IU/mL — ABNORMAL HIGH (ref 0.30–0.70)

## 2015-11-27 MED ORDER — WARFARIN SODIUM 10 MG PO TABS
10.0000 mg | ORAL_TABLET | Freq: Every day | ORAL | Status: DC
  Administered 2015-11-27 – 2015-11-28 (×2): 10 mg via ORAL
  Filled 2015-11-27 (×2): qty 1

## 2015-11-27 NOTE — Progress Notes (Signed)
Advanced Heart Failure Progress Note Subjective:   S/p Minimally invasive MVR 11/13/15 by Dr Roxy Manns.  Remains volume overloaded but continues to improve. Out > 4L and down another 6 lbs. Denies SOB or exertional CP. Still sore. Walking halls without difficulty.    INR 1.57, continues to trend down. Creatinine trending down.     Intake/Output Summary (Last 24 hours) at 11/27/15 0855 Last data filed at 11/27/15 0516  Gross per 24 hour  Intake    480 ml  Output   2925 ml  Net  -2445 ml    Current meds: . amiodarone  200 mg Oral Q12H   Followed by  . [START ON 12/01/2015] amiodarone  200 mg Oral Daily  . aspirin EC  81 mg Oral Daily  . docusate sodium  200 mg Oral Daily  . famotidine  20 mg Oral Q supper  . furosemide  80 mg Intravenous BID  . moving right along book   Does not apply Once  . sodium chloride  3 mL Intravenous Q12H  . warfarin  10 mg Oral q1800  . Warfarin - Physician Dosing Inpatient   Does not apply q1800   Infusions: . heparin 1,950 Units/hr (11/27/15 0707)     Objective:  Blood pressure 105/63, pulse 69, temperature 98.4 F (36.9 C), temperature source Oral, resp. rate 18, height 5\' 8"  (1.727 m), weight 216 lb 4.3 oz (98.1 kg), SpO2 97 %. Weight change: -6 lb 2.8 oz (-2.8 kg)  Physical Exam: General:  NAD HEENT: normal Neck: supple. JVP 9-10 cm. Carotids 2+ bilat; no bruits. No thyromegaly or lymphadenopathy Cor: PMI nondisplaced. RRR. Mechanical s1 Lungs: Clear, no resp distress Abdomen: soft, nontender, nondistended, no HSM. No bruits or masses. +BS  Extremities: no cyanosis, clubbing, rash, 1-2+ LE edema, 1+ into thighs. TED hose in place.  Neuro: alert and oriented x 3 cranial nerves grossly intact. moves all 4 extremities w/o difficulty. Affect pleasant  Telemetry: Reviewed personally, NSR/sinus brady 50-60s  Lab Results: Basic Metabolic Panel:  Recent Labs Lab 11/21/15 0413  11/23/15 0412 11/24/15 0449 11/25/15 0357 11/26/15 0357  11/27/15 0534  NA 129*  < > 130* 133* 133* 133* 134*  K 4.3  < > 3.7 4.2 4.5 4.8 4.6  CL 89*  < > 89* 91* 91* 91* 91*  CO2 27  < > 27 30 30 30 29   GLUCOSE 94  < > 101* 102* 112* 97 101*  BUN 66*  < > 66* 63* 61* 57* 53*  CREATININE 7.00*  < > 7.63* 6.83* 5.77* 5.10* 4.20*  CALCIUM 8.5*  < > 8.9 8.9 9.2 9.4 9.8  MG 2.5*  --   --   --   --   --   --   PHOS 7.1*  --   --   --   --   --   --   < > = values in this interval not displayed. Liver Function Tests:  Recent Labs Lab 11/27/15 0534  AST 16  ALT 37  ALKPHOS 155*  BILITOT 0.6  PROT 7.0  ALBUMIN 3.0*   No results for input(s): LIPASE, AMYLASE in the last 168 hours. No results for input(s): AMMONIA in the last 168 hours. CBC:  Recent Labs Lab 11/22/15 0415 11/23/15 0412 11/24/15 0449 11/26/15 0400 11/27/15 0534  WBC 7.8 9.0 8.7 9.7 11.7*  HGB 9.9* 10.8* 9.5* 9.4* 10.4*  HCT 29.0* 31.4* 28.9* 28.6* 32.1*  MCV 87.3 86.7 87.6 88.0 88.9  PLT 209  308 298 387 518*   Cardiac Enzymes: No results for input(s): CKTOTAL, CKMB, CKMBINDEX, TROPONINI in the last 168 hours. BNP: Invalid input(s): POCBNP CBG:  Recent Labs Lab 11/20/15 1151 11/20/15 1610  GLUCAP 99 86   Microbiology: Lab Results  Component Value Date   CULT NO GROWTH 5 DAYS 05/29/2015   CULT NO GROWTH 5 DAYS 05/29/2015   No results for input(s): CULT, SDES in the last 168 hours.  Imaging: No results found.   ASSESSMENT:  1. Cardiac arrest VT/VF - 11/16/15 2. 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 Cardiogenic shock s/p cardiac arrest 4. Probable ischemic cardiomyopathy EF 25-30% -> 55% 5. AKI 6. Shock liver 7. Acute respiratory failure due to #1 - resolved 8. CHB, resolved 9. New onset AF, 12/25 10. Hypokalemia  PLAN/DISCUSSION:  Clinical data supports embolus down RCA resulting in VF arrest, however, EF 55-60% on Echo 11/20/15    Continues to diurese very well. Creatinine trending down, still overloaded.  Will continue IV lasix. Weight in 200s in October.   Maintaining NSR on amio 200 bid. Continue to watch closely for heart block.   INR 1.57. Heparin and coumadin per TCTS.   Cardiac rehab saw 11/25/15 Sent referal to [place]. Disposition pending stable volume status and therapeutic INR.    LOS: 14 days    Shirley Friar PA-C 11/27/2015  Advanced Heart Failure Team Pager [phone] (M-F; 7a - 4p)  Please contact [facility] for night-coverage after hours (4p -7a ) and weekends on [email or web address]  Patient seen with PA, agree with the above note.  Creatinine stable today, good diuresis.  On exam, he has mild volume overload.  Would continue IV Lasix today, may be able to convert over to po tomorrow.  Overall stable, walking in halls.  Maintaining NSR.   Loralie Champagne 11/27/2015

## 2015-11-27 NOTE — Progress Notes (Signed)
[facility] for Heparin Indication: MVR  Allergies  Allergen Reactions  . Iohexol Hives and Itching    Patient needs 13 hour prep per Dr. Pascal Lux, after cath he broke out in hives and had itching    Patient Measurements: Height: 5\' 8"  (172.7 cm) Weight: 216 lb 4.3 oz (98.1 kg) IBW/kg (Calculated) : 68.4  Vital Signs: Temp: 98.4 F (36.9 C) (12/29 0605) Temp Source: Oral (12/29 0605) BP: 105/63 mmHg (12/29 0605) Pulse Rate: 69 (12/29 0605)  Labs:  Recent Labs  11/25/15 0357 11/25/15 1915 11/26/15 0357 11/26/15 0400 11/27/15 0534  HGB  --   --   --  9.4* 10.4*  HCT  --   --   --  28.6* 32.1*  PLT  --   --   --  387 518*  LABPROT 20.2*  --  19.9*  --  18.9*  INR 1.73*  --  1.69*  --  1.57*  HEPARINUNFRC  --  0.27* 0.56  --  0.80*  CREATININE 5.77*  --  5.10*  --  4.20*    Estimated Creatinine Clearance: 24.2 mL/min (by C-G formula based on Cr of 4.2).   Assessment: 49 y.o. male s/p MVR for heparin Goal of Therapy:  Heparin level 0.3-0.7 Monitor platelets by anticoagulation protocol: Yes   Plan:  Decrease Heparin 1950 units/hr  Phillis Knack, PharmD, BCPS   11/27/2015 6:29 AM

## 2015-11-27 NOTE — Progress Notes (Signed)
[facility] for Heparin Indication: MVR  Allergies  Allergen Reactions  . Iohexol Hives and Itching    Patient needs 13 hour prep per Dr. Pascal Lux, after cath he broke out in hives and had itching    Patient Measurements: Height: 5\' 8"  (172.7 cm) Weight: 216 lb 4.3 oz (98.1 kg) IBW/kg (Calculated) : 68.4  Vital Signs: Temp: 98.4 F (36.9 C) (12/29 0605) Temp Source: Oral (12/29 0605) BP: 105/63 mmHg (12/29 0605) Pulse Rate: 69 (12/29 0605)  Labs:  Recent Labs  11/25/15 0357  11/26/15 0357 11/26/15 0400 11/27/15 0534 11/27/15 1526  HGB  --   --   --  9.4* 10.4*  --   HCT  --   --   --  28.6* 32.1*  --   PLT  --   --   --  387 518*  --   LABPROT 20.2*  --  19.9*  --  18.9*  --   INR 1.73*  --  1.69*  --  1.57*  --   HEPARINUNFRC  --   < > 0.56  --  0.80* 0.49  CREATININE 5.77*  --  5.10*  --  4.20*  --   < > = values in this interval not displayed.  Estimated Creatinine Clearance: 24.2 mL/min (by C-G formula based on Cr of 4.2).   Assessment: 49 y.o. male s/p MVR for heparin. Heparin level is 0.49 at 1950 units/hr.  Goal of Therapy:  Heparin level 0.3-0.7 Monitor platelets by anticoagulation protocol: Yes   Plan:  No heparin changes at this time, daily heparin level/cbc   Thank you for allowing me to participate in this patients care. Evon Slack, PharmD

## 2015-11-27 NOTE — Progress Notes (Signed)
CARDIAC REHAB PHASE I   PRE:  Rate/Rhythm: 66 SR  BP:  Supine:   Sitting: 98/62  Standing:    SaO2: 100%RA  MODE:  Ambulation: 1240 ft   POST:  Rate/Rhythm: 74 SR  BP:  Supine:   Sitting: 118/81  Standing:    SaO2: 100%RA 0958-1027 Pt walked 1240 ft with steady gait. Tolerated well. No complaints. To recliner with call bell.   Graylon Good, RN BSN  11/27/2015 10:23 AM

## 2015-11-27 NOTE — Progress Notes (Addendum)
      [address]       [employer] [address]             [phone]      14 Days Post-Op Procedure(s) (LRB): TRANSESOPHAGEAL ECHOCARDIOGRAM (TEE) (N/A) MINIMALLY INVASIVE MITRAL VALVE (MV) REPLACEMENT (Right) Subjective: conts to feel better  Objective: Vital signs in last 24 hours: Temp:  [98.3 F (36.8 C)-98.4 F (36.9 C)] 98.4 F (36.9 C) (12/29 0605) Pulse Rate:  [63-69] 69 (12/29 0605) Cardiac Rhythm:  [-] Heart block (12/28 2001) Resp:  [18] 18 (12/29 0605) BP: (99-113)/(57-63) 105/63 mmHg (12/29 0605) SpO2:  [97 %-100 %] 97 % (12/29 0605) Weight:  [216 lb 4.3 oz (98.1 kg)] 216 lb 4.3 oz (98.1 kg) (12/29 0605)  Hemodynamic parameters for last 24 hours:    Intake/Output from previous day: 12/28 0701 - 12/29 0700 In: 600 [P.O.:600] Out: 3725 [Urine:3725] Intake/Output this shift:    General appearance: alert, cooperative and no distress Heart: regular rate and rhythm Lungs: dim in bases Abdomen: benign Extremities: + LE EDEMA Wound: incis healing well  Lab Results:  Recent Labs  11/26/15 0400 11/27/15 0534  WBC 9.7 11.7*  HGB 9.4* 10.4*  HCT 28.6* 32.1*  PLT 387 518*   BMET:  Recent Labs  11/26/15 0357 11/27/15 0534  NA 133* 134*  K 4.8 4.6  CL 91* 91*  CO2 30 29  GLUCOSE 97 101*  BUN 57* 53*  CREATININE 5.10* 4.20*  CALCIUM 9.4 9.8    PT/INR:  Recent Labs  11/27/15 0534  LABPROT 18.9*  INR 1.57*   ABG    Component Value Date/Time   PHART 7.596* 11/17/2015 0814   HCO3 33.0* 11/17/2015 0814   TCO2 34 11/17/2015 0814   ACIDBASEDEF 2.0 11/16/2015 1333   O2SAT 55.1 11/20/2015 0850   CBG (last 3)  No results for input(s): GLUCAP in the last 72 hours.  Meds Scheduled Meds: . amiodarone  200 mg Oral Q12H   Followed by  . [START ON 12/01/2015] amiodarone  200 mg Oral Daily  . aspirin EC  81 mg Oral Daily  . docusate sodium  200 mg Oral Daily  . famotidine  20 mg Oral Q supper  . furosemide  80 mg Intravenous  BID  . moving right along book   Does not apply Once  . sodium chloride  3 mL Intravenous Q12H  . warfarin  7.5 mg Oral q1800  . Warfarin - Physician Dosing Inpatient   Does not apply q1800   Continuous Infusions: . heparin 1,950 Units/hr (11/27/15 0707)   PRN Meds:.sodium chloride, acetaminophen, bisacodyl **OR** bisacodyl, ondansetron **OR** ondansetron (ZOFRAN) IV, oxyCODONE, sodium chloride, sodium chloride, traMADol, white petrolatum  Xrays No results found.  Assessment/Plan: S/P Procedure(s) (LRB): TRANSESOPHAGEAL ECHOCARDIOGRAM (TEE) (N/A) MINIMALLY INVASIVE MITRAL VALVE (MV) REPLACEMENT (Right)  1 conts with good progress 2 excellent diuresis, creat conts to improve 3 LFT's now normal 4 Sinus rhythm/hemodyn stable 5 INR remains sub therapeutic, will go to 10 mg coumadin, remains on heparin 6 Heart failure team/EP assisting with management     LOS: 14 days    GOLD,WAYNE E 11/27/2015  patient examined and medical record reviewed,agree with above note. Tharon Aquas Trigt III 11/27/2015

## 2015-11-28 LAB — BASIC METABOLIC PANEL
Anion gap: 12 (ref 5–15)
BUN: 51 mg/dL — ABNORMAL HIGH (ref 6–20)
CALCIUM: 9.6 mg/dL (ref 8.9–10.3)
CO2: 28 mmol/L (ref 22–32)
Chloride: 92 mmol/L — ABNORMAL LOW (ref 101–111)
Creatinine, Ser: 3.54 mg/dL — ABNORMAL HIGH (ref 0.61–1.24)
GFR calc Af Amer: 22 mL/min — ABNORMAL LOW (ref >60)
GFR, EST NON AFRICAN AMERICAN: 19 mL/min — AB (ref >60)
GLUCOSE: 98 mg/dL (ref 65–99)
Potassium: 4.5 mmol/L (ref 3.5–5.1)
Sodium: 132 mmol/L — ABNORMAL LOW (ref 135–145)

## 2015-11-28 LAB — CBC
HEMATOCRIT: 31 % — AB (ref 39.0–52.0)
Hemoglobin: 10.1 g/dL — ABNORMAL LOW (ref 13.0–17.0)
MCH: 28.9 pg (ref 26.0–34.0)
MCHC: 32.6 g/dL (ref 30.0–36.0)
MCV: 88.8 fL (ref 78.0–100.0)
Platelets: 470 10*3/uL — ABNORMAL HIGH (ref 150–400)
RBC: 3.49 MIL/uL — ABNORMAL LOW (ref 4.22–5.81)
RDW: 13.1 % (ref 11.5–15.5)
WBC: 9.1 10*3/uL (ref 4.0–10.5)

## 2015-11-28 LAB — HEPARIN LEVEL (UNFRACTIONATED): Heparin Unfractionated: 0.48 IU/mL (ref 0.30–0.70)

## 2015-11-28 LAB — PROTIME-INR
INR: 1.79 — ABNORMAL HIGH (ref 0.00–1.49)
Prothrombin Time: 20.8 seconds — ABNORMAL HIGH (ref 11.6–15.2)

## 2015-11-28 MED ORDER — FUROSEMIDE 40 MG PO TABS
40.0000 mg | ORAL_TABLET | Freq: Every day | ORAL | Status: DC
  Administered 2015-11-28 – 2015-12-01 (×4): 40 mg via ORAL
  Filled 2015-11-28 (×4): qty 1

## 2015-11-28 NOTE — Progress Notes (Signed)
CARDIAC REHAB PHASE I   PRE:  Rate/Rhythm: 69 SR    BP: sitting 100/55    SaO2: 99 RA  MODE:  Ambulation: 1490 ft   POST:  Rate/Rhythm: 78 SR    BP: sitting 122/72     SaO2: 100 RA  Tolerated well, no c/o. Independent. To recliner. Will f/u in am with ed. [id number]   Darrick Meigs CES, ACSM 11/28/2015 11:57 AM

## 2015-11-28 NOTE — Progress Notes (Signed)
Utilization review completed.  

## 2015-11-28 NOTE — Progress Notes (Signed)
ANTICOAGULATION CONSULT NOTE - Follow Up Consult  Pharmacy Consult for Heparin Indication: MVR/AFib  Allergies  Allergen Reactions  . Iohexol Hives and Itching    Patient needs 13 hour prep per Dr. Pascal Lux, after cath he broke out in hives and had itching    Patient Measurements: Height: 5\' 8"  (172.7 cm) Weight: 210 lb (95.255 kg) IBW/kg (Calculated) : 68.4  Heparin dosing wt: 90kg  Vital Signs: Temp: 97.8 F (36.6 C) (12/30 0558) Temp Source: Oral (12/30 0558) BP: 102/60 mmHg (12/30 0558) Pulse Rate: 62 (12/30 0558) 85 Labs:  Recent Labs  11/26/15 0357  11/26/15 0400 11/27/15 0534 11/27/15 1526 11/28/15 0322  HGB  --   < > 9.4* 10.4*  --  10.1*  HCT  --   --  28.6* 32.1*  --  31.0*  PLT  --   --  387 518*  --  470*  LABPROT 19.9*  --   --  18.9*  --  20.8*  INR 1.69*  --   --  1.57*  --  1.79*  HEPARINUNFRC 0.56  --   --  0.80* 0.49 0.48  CREATININE 5.10*  --   --  4.20*  --  3.54*  < > = values in this interval not displayed.  Estimated Creatinine Clearance: 28.3 mL/min (by C-G formula based on Cr of 3.54).   Assessment: 49 yo M admitted on 11/13/2015 for mechanical MVR and new afib noted on 12/25.  He was on heparin for bridge therapy which was stopped 12/26 w/ INR 1.99 but now resumed (with no bolus) due to INR drop to 1.73. HL remains therapeutic today at 0.48. MD dosing Coumadin and INR with slow trend up to 1.79. H/H remains stable with no significant s/s bleeding noted.   Goal of Therapy:  Heparin level 0.3-0.7 Monitor platelets by anticoagulation protocol: Yes   Plan:  - Continue heparin gtt at 1950 units/hr - Daily HL, CBC, INR - Coumadin 10 mg PO daily per MD  Doroteo Bradford K. Velva Harman, PharmD, BCPS, CPP Clinical Pharmacist Pager: [phone] Phone: [phone] 11/28/2015 8:06 AM

## 2015-11-28 NOTE — Progress Notes (Addendum)
[address]       [employer] [address]             [phone]      15 Days Post-Op Procedure(s) (LRB): TRANSESOPHAGEAL ECHOCARDIOGRAM (TEE) (N/A) MINIMALLY INVASIVE MITRAL VALVE (MV) REPLACEMENT (Right) Subjective: conts to feel well  Objective: Vital signs in last 24 hours: Temp:  [97.8 F (36.6 C)-98.7 F (37.1 C)] 97.8 F (36.6 C) (12/30 0558) Pulse Rate:  [62-64] 62 (12/30 0558) Cardiac Rhythm:  [-] Heart block (12/29 1900) Resp:  [18] 18 (12/30 0558) BP: (102)/(57-60) 102/60 mmHg (12/30 0558) SpO2:  [93 %-95 %] 93 % (12/30 0558) Weight:  [210 lb (95.255 kg)] 210 lb (95.255 kg) (12/30 0558)  Hemodynamic parameters for last 24 hours:    Intake/Output from previous day: 12/29 0701 - 12/30 0700 In: 250 [P.O.:240; I.V.:10] Out: 1700 [Urine:1700] Intake/Output this shift:    General appearance: alert, cooperative and no distress Heart: regular rate and rhythm Lungs: mildly dim in right>left base Abdomen: benign Extremities: less edema Wound: incis healing well  Lab Results:  Recent Labs  11/27/15 0534 11/28/15 0322  WBC 11.7* 9.1  HGB 10.4* 10.1*  HCT 32.1* 31.0*  PLT 518* 470*   BMET:  Recent Labs  11/27/15 0534 11/28/15 0322  NA 134* 132*  K 4.6 4.5  CL 91* 92*  CO2 29 28  GLUCOSE 101* 98  BUN 53* 51*  CREATININE 4.20* 3.54*  CALCIUM 9.8 9.6    PT/INR:  Recent Labs  11/28/15 0322  LABPROT 20.8*  INR 1.79*   ABG    Component Value Date/Time   PHART 7.596* 11/17/2015 0814   HCO3 33.0* 11/17/2015 0814   TCO2 34 11/17/2015 0814   ACIDBASEDEF 2.0 11/16/2015 1333   O2SAT 55.1 11/20/2015 0850   CBG (last 3)  No results for input(s): GLUCAP in the last 72 hours.  Meds Scheduled Meds: . amiodarone  200 mg Oral Q12H   Followed by  . [START ON 12/01/2015] amiodarone  200 mg Oral Daily  . aspirin EC  81 mg Oral Daily  . docusate sodium  200 mg Oral Daily  . famotidine  20 mg Oral Q supper  . furosemide  80 mg  Intravenous BID  . moving right along book   Does not apply Once  . sodium chloride  3 mL Intravenous Q12H  . warfarin  10 mg Oral q1800  . Warfarin - Physician Dosing Inpatient   Does not apply q1800   Continuous Infusions: . heparin 1,950 Units/hr (11/27/15 2324)   PRN Meds:.sodium chloride, acetaminophen, bisacodyl **OR** bisacodyl, ondansetron **OR** ondansetron (ZOFRAN) IV, oxyCODONE, sodium chloride, sodium chloride, traMADol, white petrolatum  Xrays No results found.  Assessment/Plan: S/P Procedure(s) (LRB): TRANSESOPHAGEAL ECHOCARDIOGRAM (TEE) (N/A) MINIMALLY INVASIVE MITRAL VALVE (MV) REPLACEMENT (Right)  1 doing well 2 renal fxn conts to improve- poss transition to po lasix  3 INR rising- EPW's may nee to be cut at skin to remove- timing to be determined Cont on heparin till INR >2 4 cont rehab and pulm toilet 5 no brady dysrhythmias   LOS: 15 days    GOLD,WAYNE E 11/28/2015   Chart reviewed, patient examined, agree with above. He continues to diurese well and renal function improving daily with creat down to 3.54.  INR up to 1.79 on coumadin 10 mg daily. When INR is 2.0 I would stop heparin and pull pacing wires. I would not cut them off. He feels well. Lower  extremity edema is improving.

## 2015-11-28 NOTE — Progress Notes (Signed)
Advanced Heart Failure Progress Note Subjective:   S/p Minimally invasive MVR 11/13/15 by Dr Roxy Manns.  Volume status improving. Out > 2L and down 6 lbs. No complaints this am. Continues to walk the halls without difficulty.   INR 1.79. Creatinine continues to trend down.      Intake/Output Summary (Last 24 hours) at 11/28/15 0750 Last data filed at 11/28/15 0604  Gross per 24 hour  Intake    250 ml  Output   1700 ml  Net  -1450 ml    Current meds: . amiodarone  200 mg Oral Q12H   Followed by  . [START ON 12/01/2015] amiodarone  200 mg Oral Daily  . aspirin EC  81 mg Oral Daily  . docusate sodium  200 mg Oral Daily  . famotidine  20 mg Oral Q supper  . furosemide  80 mg Intravenous BID  . moving right along book   Does not apply Once  . sodium chloride  3 mL Intravenous Q12H  . warfarin  10 mg Oral q1800  . Warfarin - Physician Dosing Inpatient   Does not apply q1800   Infusions: . heparin 1,950 Units/hr (11/27/15 2324)     Objective:  Blood pressure 102/60, pulse 62, temperature 97.8 F (36.6 C), temperature source Oral, resp. rate 18, height 5\' 8"  (1.727 m), weight 210 lb (95.255 kg), SpO2 93 %. Weight change: -6 lb 4.3 oz (-2.845 kg)  Physical Exam: General:  NAD HEENT: normal Neck: supple. JVP 7-8 cm. Carotids 2+ bilat; no bruits. No thyromegaly or lymphadenopathy Cor: PMI nondisplaced. RRR. Mechanical s1 Lungs: Clear, no resp distress Abdomen: soft, NT, ND, no HSM. No bruits or masses. +BS Extremities: no cyanosis, clubbing, rash, 1+ Ankle edema, L>R. TED hose in place.  Neuro: alert and oriented x 3 cranial nerves grossly intact. moves all 4 extremities w/o difficulty. Affect pleasant  Telemetry: Reviewed personally, NSR 60s  Lab Results: Basic Metabolic Panel:  Recent Labs Lab 11/24/15 0449 11/25/15 0357 11/26/15 0357 11/27/15 0534 11/28/15 0322  NA 133* 133* 133* 134* 132*  K 4.2 4.5 4.8 4.6 4.5  CL 91* 91* 91* 91* 92*  CO2 30 30 30 29 28     GLUCOSE 102* 112* 97 101* 98  BUN 63* 61* 57* 53* 51*  CREATININE 6.83* 5.77* 5.10* 4.20* 3.54*  CALCIUM 8.9 9.2 9.4 9.8 9.6   Liver Function Tests:  Recent Labs Lab 11/27/15 0534  AST 16  ALT 37  ALKPHOS 155*  BILITOT 0.6  PROT 7.0  ALBUMIN 3.0*   No results for input(s): LIPASE, AMYLASE in the last 168 hours. No results for input(s): AMMONIA in the last 168 hours. CBC:  Recent Labs Lab 11/23/15 0412 11/24/15 0449 11/26/15 0400 11/27/15 0534 11/28/15 0322  WBC 9.0 8.7 9.7 11.7* 9.1  HGB 10.8* 9.5* 9.4* 10.4* 10.1*  HCT 31.4* 28.9* 28.6* 32.1* 31.0*  MCV 86.7 87.6 88.0 88.9 88.8  PLT 308 298 387 518* 470*   Cardiac Enzymes: No results for input(s): CKTOTAL, CKMB, CKMBINDEX, TROPONINI in the last 168 hours. BNP: Invalid input(s): POCBNP CBG: No results for input(s): GLUCAP in the last 168 hours. Microbiology: Lab Results  Component Value Date   CULT NO GROWTH 5 DAYS 05/29/2015   CULT NO GROWTH 5 DAYS 05/29/2015   No results for input(s): CULT, SDES in the last 168 hours.  Imaging: No results found.   ASSESSMENT:  1. Cardiac arrest VT/VF - 11/16/15 2. 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  Cardiogenic shock s/p cardiac arrest 4. Probable ischemic cardiomyopathy EF 25-30% -> 55% 5. AKI 6. Shock liver 7. Acute respiratory failure due to #1 - resolved 8. CHB, resolved 9. New onset AF, 12/25 10. Hypokalemia  PLAN/DISCUSSION:  Clinical data supports embolus down RCA resulting in VF arrest, however, EF 55-60% on Echo 11/20/15    Volume status much improved. Creatinine continues to trend down. Hope it will normalize in coming days.  Will start on 40 mg po lasix.  Was on 20 mg po prior to admission.   Maintaining NSR on amio 200 bid. Continue to watch closely for heart block. PR appears stable on tele.   INR 1.79. Heparin and coumadin per TCTS.   Cardiac rehab saw 11/25/15 Sent referal to [facility]. Disposition therapeutic  INR. Will need close follow up.    LOS: 15 days    Shirley Friar PA-C 11/28/2015  Advanced Heart Failure Team Pager [phone] (M-F; 7a - 4p)  Please contact [facility] for night-coverage after hours (4p -7a ) and weekends on [email or web address]  Patient seen with PA, agree with the above note.  Doing well.  Will transition to po Lasix.  Maintains NSR.  Will see again on Monday as long as stable over the weekend.   Loralie Champagne 11/28/2015

## 2015-11-29 LAB — CBC
HEMATOCRIT: 30.4 % — AB (ref 39.0–52.0)
HEMOGLOBIN: 9.9 g/dL — AB (ref 13.0–17.0)
MCH: 28.9 pg (ref 26.0–34.0)
MCHC: 32.6 g/dL (ref 30.0–36.0)
MCV: 88.6 fL (ref 78.0–100.0)
Platelets: 447 10*3/uL — ABNORMAL HIGH (ref 150–400)
RBC: 3.43 MIL/uL — ABNORMAL LOW (ref 4.22–5.81)
RDW: 13.1 % (ref 11.5–15.5)
WBC: 8.6 10*3/uL (ref 4.0–10.5)

## 2015-11-29 LAB — BASIC METABOLIC PANEL
Anion gap: 9 (ref 5–15)
BUN: 42 mg/dL — ABNORMAL HIGH (ref 6–20)
CHLORIDE: 95 mmol/L — AB (ref 101–111)
CO2: 28 mmol/L (ref 22–32)
CREATININE: 2.73 mg/dL — AB (ref 0.61–1.24)
Calcium: 9.5 mg/dL (ref 8.9–10.3)
GFR calc non Af Amer: 26 mL/min — ABNORMAL LOW (ref >60)
GFR, EST AFRICAN AMERICAN: 30 mL/min — AB (ref >60)
Glucose, Bld: 93 mg/dL (ref 65–99)
Potassium: 4.4 mmol/L (ref 3.5–5.1)
Sodium: 132 mmol/L — ABNORMAL LOW (ref 135–145)

## 2015-11-29 LAB — HEPARIN LEVEL (UNFRACTIONATED): HEPARIN UNFRACTIONATED: 0.86 [IU]/mL — AB (ref 0.30–0.70)

## 2015-11-29 LAB — PROTIME-INR
INR: 2.28 — AB (ref 0.00–1.49)
Prothrombin Time: 24.9 seconds — ABNORMAL HIGH (ref 11.6–15.2)

## 2015-11-29 MED ORDER — WARFARIN SODIUM 7.5 MG PO TABS
7.5000 mg | ORAL_TABLET | Freq: Every day | ORAL | Status: DC
  Administered 2015-11-29 – 2015-11-30 (×2): 7.5 mg via ORAL
  Filled 2015-11-29 (×2): qty 1

## 2015-11-29 MED ORDER — AMIODARONE HCL 200 MG PO TABS
200.0000 mg | ORAL_TABLET | Freq: Every day | ORAL | Status: DC
  Administered 2015-11-29 – 2015-12-01 (×3): 200 mg via ORAL
  Filled 2015-11-29 (×3): qty 1

## 2015-11-29 NOTE — Progress Notes (Signed)
CARDIAC REHAB PHASE I   PRE:  Rate/Rhythm: 65 Sr  BP:  Sitting: 98/66      SaO2: 100 ra  MODE:  Ambulation: 550 ft   POST:  Rate/Rhythm: 74 sr  BP:  Sitting: 115/69     SaO2: 100 ra [id number] Patient ambulated in hallway independently with steady gait noted. Denied complaints. Post ambulation patient back to sitting in chair with call bell and phone in reach. Patient very motivated and ambulates on his on during the day. Patient denied need for further education reinforcement. Will follow up on Tuesday if patient is not discharged over the holiday.  Santina Evans, BSN 11/29/2015 10:42 AM

## 2015-11-29 NOTE — Progress Notes (Addendum)
      [address]       [place] [address]             [phone]        16 Days Post-Op Procedure(s) (LRB): TRANSESOPHAGEAL ECHOCARDIOGRAM (TEE) (N/A) MINIMALLY INVASIVE MITRAL VALVE (MV) REPLACEMENT (Right)  Subjective: Patient continues to feel better. He does have some incisional pain right side of chest this am.  Objective: Vital signs in last 24 hours: Temp:  [98 F (36.7 C)-99.5 F (37.5 C)] 98 F (36.7 C) (12/31 0600) Pulse Rate:  [65-70] 65 (12/31 0600) Cardiac Rhythm:  [-] Heart block (12/30 1937) Resp:  [18] 18 (12/31 0600) BP: (93-98)/(52-58) 93/58 mmHg (12/31 0600) SpO2:  [100 %] 100 % (12/31 0600) Weight:  [207 lb 4.8 oz (94.031 kg)] 207 lb 4.8 oz (94.031 kg) (12/31 0600)  Pre op weight 99 kg Current Weight  11/29/15 207 lb 4.8 oz (94.031 kg)       Intake/Output from previous day: 12/30 0701 - 12/31 0700 In: 730 [P.O.:720; I.V.:10] Out: 2150 [Urine:2150]   Physical Exam:  Cardiovascular: RRR, no murmur, valve click Pulmonary: Clear to auscultation bilaterally; no rales, wheezes, or rhonchi. Abdomen: Soft, non tender, bowel sounds present. Extremities: Bilateral lower extremity edema. Wounds: Clean and dry.  No erythema or signs of infection.  Lab Results: CBC: Recent Labs  11/28/15 0322 11/29/15 0605  WBC 9.1 8.6  HGB 10.1* 9.9*  HCT 31.0* 30.4*  PLT 470* 447*   BMET:  Recent Labs  11/28/15 0322 11/29/15 0605  NA 132* 132*  K 4.5 4.4  CL 92* 95*  CO2 28 28  GLUCOSE 98 93  BUN 51* 42*  CREATININE 3.54* 2.73*  CALCIUM 9.6 9.5    PT/INR:  Lab Results  Component Value Date   INR 2.28* 11/29/2015   INR 1.79* 11/28/2015   INR 1.57* 11/27/2015   ABG:  INR: Will add last result for INR, ABG once components are confirmed Will add last 4 CBG results once components are confirmed  Assessment/Plan:  1. CV - SR/first degree heart block in the 60's. On Amiodarone 200 mg bid, Heparin drip, and Coumadin. SBP  remains in the 90's. QTc remains elevated (greater than 459) so will decrease Amiodarone. INR increased from 1.79 to 2.28. Will give 7.5 mg tonight, instead of 10 mg. Stop Heparin drip as INR > 2. 2.  Pulmonary - On room air. Encourage incentive spirometer 3. Volume Overload - On Lasix 40 mg daily 4.  Acute blood loss anemia - H and H 9.9 and 30.4 5. Creatinine decreased from 3.54 to 2.73 6. Remove EPW this afternoon-Dr. Prescott Gum will do  ZIMMERMAN,DONIELLE MPA-C 11/29/2015,8:11 AM  patient examined and medical record reviewed,agree with above note. Tharon Aquas Trigt III 11/29/2015

## 2015-11-29 NOTE — Progress Notes (Signed)
ANTICOAGULATION CONSULT NOTE - Follow Up Consult  Pharmacy Consult for Heparin bridge until INR > 2 Indication: MVR  Allergies  Allergen Reactions  . Iohexol Hives and Itching    Patient needs 13 hour prep per Dr. Pascal Lux, after cath he broke out in hives and had itching     Patient Measurements: Height: 5\' 8"  (172.7 cm) Weight: 207 lb 4.8 oz (94.031 kg) IBW/kg (Calculated) : 68.4  Vital Signs: Temp: 98 F (36.7 C) (12/31 0600) Temp Source: Oral (12/31 0600) BP: 93/58 mmHg (12/31 0600) Pulse Rate: 65 (12/31 0600)  Labs:  Recent Labs  11/27/15 0534 11/27/15 1526 11/28/15 0322 11/29/15 0605  HGB 10.4*  --  10.1* 9.9*  HCT 32.1*  --  31.0* 30.4*  PLT 518*  --  470* 447*  LABPROT 18.9*  --  20.8* 24.9*  INR 1.57*  --  1.79* 2.28*  HEPARINUNFRC 0.80* 0.49 0.48 0.86*  CREATININE 4.20*  --  3.54* 2.73*    Estimated Creatinine Clearance: 36.4 mL/min (by C-G formula based on Cr of 2.73).   Assessment: On heparin/warfarin bridge until INR > 2. INR is 2.28 this AM.  Goal of Therapy:  Heparin level 0.3-0.5 units/ml Monitor platelets by anticoagulation protocol: Yes   Plan:  -DC heparin drip  -Warfarin per MD  Narda Bonds 11/29/2015,6:45 AM

## 2015-11-30 LAB — PROTIME-INR
INR: 2.51 — ABNORMAL HIGH (ref 0.00–1.49)
Prothrombin Time: 26.8 seconds — ABNORMAL HIGH (ref 11.6–15.2)

## 2015-11-30 LAB — BASIC METABOLIC PANEL
Anion gap: 9 (ref 5–15)
BUN: 37 mg/dL — AB (ref 6–20)
CHLORIDE: 98 mmol/L — AB (ref 101–111)
CO2: 27 mmol/L (ref 22–32)
Calcium: 9.6 mg/dL (ref 8.9–10.3)
Creatinine, Ser: 2.39 mg/dL — ABNORMAL HIGH (ref 0.61–1.24)
GFR calc Af Amer: 35 mL/min — ABNORMAL LOW (ref >60)
GFR calc non Af Amer: 30 mL/min — ABNORMAL LOW (ref >60)
GLUCOSE: 92 mg/dL (ref 65–99)
POTASSIUM: 4.6 mmol/L (ref 3.5–5.1)
Sodium: 134 mmol/L — ABNORMAL LOW (ref 135–145)

## 2015-11-30 LAB — CBC
HCT: 30.2 % — ABNORMAL LOW (ref 39.0–52.0)
HEMOGLOBIN: 10.1 g/dL — AB (ref 13.0–17.0)
MCH: 29.7 pg (ref 26.0–34.0)
MCHC: 33.4 g/dL (ref 30.0–36.0)
MCV: 88.8 fL (ref 78.0–100.0)
Platelets: 471 10*3/uL — ABNORMAL HIGH (ref 150–400)
RBC: 3.4 MIL/uL — AB (ref 4.22–5.81)
RDW: 13.1 % (ref 11.5–15.5)
WBC: 7.6 10*3/uL (ref 4.0–10.5)

## 2015-11-30 NOTE — Progress Notes (Addendum)
      [address]       [place] [address]             [phone]        17 Days Post-Op Procedure(s) (LRB): TRANSESOPHAGEAL ECHOCARDIOGRAM (TEE) (N/A) MINIMALLY INVASIVE MITRAL VALVE (MV) REPLACEMENT (Right)  Subjective: Patient without specific complaints this am.  Objective: Vital signs in last 24 hours: Temp:  [98.1 F (36.7 C)-99 F (37.2 C)] 98.1 F (36.7 C) (01/01 0430) Pulse Rate:  [61-68] 61 (01/01 0430) Cardiac Rhythm:  [-] Normal sinus rhythm (01/01 0700) Resp:  [18] 18 (01/01 0430) BP: (91-101)/(51-59) 101/59 mmHg (01/01 0430) SpO2:  [99 %-100 %] 99 % (01/01 0430) Weight:  [207 lb 3.2 oz (93.985 kg)] 207 lb 3.2 oz (93.985 kg) (01/01 0430)  Pre op weight 99 kg Current Weight  11/30/15 207 lb 3.2 oz (93.985 kg)       Intake/Output from previous day: 12/31 0701 - 01/01 0700 In: 720 [P.O.:720] Out: -    Physical Exam:  Cardiovascular: RRR, no murmur, valve click Pulmonary: Clear to auscultation bilaterally; no rales, wheezes, or rhonchi. Abdomen: Soft, non tender, bowel sounds present. Extremities: Bilateral lower extremity edema. Wounds: Clean and dry.  No erythema or signs of infection.  Lab Results: CBC:  Recent Labs  11/29/15 0605 11/30/15 0600  WBC 8.6 7.6  HGB 9.9* 10.1*  HCT 30.4* 30.2*  PLT 447* 471*   BMET:   Recent Labs  11/29/15 0605 11/30/15 0600  NA 132* 134*  K 4.4 4.6  CL 95* 98*  CO2 28 27  GLUCOSE 93 92  BUN 42* 37*  CREATININE 2.73* 2.39*  CALCIUM 9.5 9.6    PT/INR:  Lab Results  Component Value Date   INR 2.51* 11/30/2015   INR 2.28* 11/29/2015   INR 1.79* 11/28/2015   ABG:  INR: Will add last result for INR, ABG once components are confirmed Will add last 4 CBG results once components are confirmed  Assessment/Plan:  1. CV - SR/first degree heart block in the 60's. On Amiodarone 200 mg daily, and Coumadin. SBP remains in the 90's.INR increased from 2.28 to 2.51. Will continue  with Coumadin 7.5 mg. 2.  Pulmonary - On room air. Encourage incentive spirometer 3. Volume Overload - On Lasix 40 mg daily 4.  Acute blood loss anemia - H and H 10.1 and 30.2 5. Creatinine decreased from 2.73 to 2.39 6. Remove EPW this am  ZIMMERMAN,DONIELLE MPA-C 11/30/2015,8:20 AM   patient examined and medical record reviewed,agree with above note. Home 1-2 days Tharon Aquas Trigt III 11/30/2015

## 2015-11-30 NOTE — Progress Notes (Signed)
Epicardial pacing wires were removed without difficulty. The skin had grown over the sutures, but there was very little resistance with pulling of wires. Patient tolerated the procedure well. The patient and his nurse were informed that he is to remain in bed for 2 hours.

## 2015-12-01 LAB — CBC
HEMATOCRIT: 31.8 % — AB (ref 39.0–52.0)
Hemoglobin: 10.3 g/dL — ABNORMAL LOW (ref 13.0–17.0)
MCH: 28.8 pg (ref 26.0–34.0)
MCHC: 32.4 g/dL (ref 30.0–36.0)
MCV: 88.8 fL (ref 78.0–100.0)
PLATELETS: 480 10*3/uL — AB (ref 150–400)
RBC: 3.58 MIL/uL — AB (ref 4.22–5.81)
RDW: 12.9 % (ref 11.5–15.5)
WBC: 7.5 10*3/uL (ref 4.0–10.5)

## 2015-12-01 LAB — BASIC METABOLIC PANEL
ANION GAP: 9 (ref 5–15)
BUN: 33 mg/dL — ABNORMAL HIGH (ref 6–20)
CALCIUM: 9.8 mg/dL (ref 8.9–10.3)
CO2: 26 mmol/L (ref 22–32)
Chloride: 99 mmol/L — ABNORMAL LOW (ref 101–111)
Creatinine, Ser: 2.07 mg/dL — ABNORMAL HIGH (ref 0.61–1.24)
GFR, EST AFRICAN AMERICAN: 42 mL/min — AB (ref >60)
GFR, EST NON AFRICAN AMERICAN: 36 mL/min — AB (ref >60)
GLUCOSE: 94 mg/dL (ref 65–99)
POTASSIUM: 4.8 mmol/L (ref 3.5–5.1)
Sodium: 134 mmol/L — ABNORMAL LOW (ref 135–145)

## 2015-12-01 LAB — PROTIME-INR
INR: 2.52 — AB (ref 0.00–1.49)
Prothrombin Time: 26.8 seconds — ABNORMAL HIGH (ref 11.6–15.2)

## 2015-12-01 MED ORDER — OXYCODONE HCL 5 MG PO TABS: 5.0000 mg | ORAL_TABLET | ORAL | Status: DC | PRN

## 2015-12-01 MED ORDER — WARFARIN SODIUM 5 MG PO TABS: ORAL_TABLET | ORAL | Status: DC

## 2015-12-01 MED ORDER — ASPIRIN 81 MG PO TBEC: 81.0000 mg | DELAYED_RELEASE_TABLET | Freq: Every day | ORAL | Status: DC

## 2015-12-01 MED ORDER — ACETAMINOPHEN 325 MG PO TABS: 650.0000 mg | ORAL_TABLET | Freq: Four times a day (QID) | ORAL | Status: DC | PRN

## 2015-12-01 MED ORDER — WARFARIN SODIUM 10 MG PO TABS: 10.0000 mg | ORAL_TABLET | Freq: Every day | ORAL | Status: DC

## 2015-12-01 MED ORDER — FUROSEMIDE 40 MG PO TABS: 40.0000 mg | ORAL_TABLET | Freq: Every day | ORAL | Status: DC

## 2015-12-01 MED ORDER — AMIODARONE HCL 200 MG PO TABS: 200.0000 mg | ORAL_TABLET | Freq: Every day | ORAL | Status: DC

## 2015-12-01 MED ORDER — WARFARIN SODIUM 5 MG PO TABS: 10.0000 mg | ORAL_TABLET | Freq: Every day | ORAL | Status: DC

## 2015-12-01 NOTE — Progress Notes (Signed)
Pt is stable. Pt has been educated on the importance of following up with doctors and taking medication as prescribed. Pt chest tube sutures removed. Pt picc line removed. Prescriptions given to pt's daughter. Telemetry discontinued. Pt has been discharged.

## 2015-12-01 NOTE — Care Management Note (Signed)
Case Management Note Previous CM note initiated by Luz Lex RN, CM  Patient Details  Name: Oscar Brown MRN: [id number] Date of Birth: 1966-01-20  Subjective/Objective:     Patient awake and alert.  Lives at home alone, has GF.  On discharge is plan for his sister, children and GF to be with him 24/7.                Action/Plan:   Expected Discharge Date:    12/01/15              Expected Discharge Plan:  [place]  In-House Referral:     Discharge planning Services  CM Consult  Post Acute Care Choice:  Home Health Choice offered to:  Patient  DME Arranged:  N/A DME Agency:  NA  HH Arranged:  RN [place] Agency:  [place]  Status of Service:  Completed, signed off  Medicare Important Message Given:    Date Medicare IM Given:    Medicare IM give by:    Date Additional Medicare IM Given:    Additional Medicare Important Message give by:     If discussed at [place] of Stay Meetings, dates discussed:    Discharge Disposition: Home/Home Health   Additional Comments:  12/01/15- pt for d/c home today- spoke with pt at  Bedside- pt agreeable to [facility]- choice offered for [facility] agency in [facility]- per pt choice- he would like to use- [facility] for HH-RN services- referral called into Lelan Pons with [facility]- for [place]- (labs needed 1/8) no other CM needs identified with pt.   Dawayne Patricia, RN 12/01/2015, 10:07 AM Case Management Note  Patient Details  Name: Oscar Brown MRN: [id number] Date of Birth: Dec 05, 1965  Subjective/Objective:                    Action/Plan:   Expected Discharge Date:                  Expected Discharge Plan:  [place]  In-House Referral:     Discharge planning Services  CM Consult  Post Acute Care Choice:  Home Health Choice offered to:  Patient  DME Arranged:  N/A DME Agency:  NA  HH Arranged:  RN [place] Agency:  [place]  Status of Service:  Completed, signed  off  Medicare Important Message Given:    Date Medicare IM Given:    Medicare IM give by:    Date Additional Medicare IM Given:    Additional Medicare Important Message give by:     If discussed at [place] of Stay Meetings, dates discussed:    Additional Comments:  Dawayne Patricia, RN 12/01/2015, 10:07 AM

## 2015-12-01 NOTE — Progress Notes (Addendum)
      [address]       [place] [address]             [phone]        18 Days Post-Op Procedure(s) (LRB): TRANSESOPHAGEAL ECHOCARDIOGRAM (TEE) (N/A) MINIMALLY INVASIVE MITRAL VALVE (MV) REPLACEMENT (Right)  Subjective: Patient without specific complaints this am.  Objective: Vital signs in last 24 hours: Temp:  [98.2 F (36.8 C)-99.2 F (37.3 C)] 98.3 F (36.8 C) (01/02 0439) Pulse Rate:  [65-70] 70 (01/02 0439) Cardiac Rhythm:  [-] Normal sinus rhythm;Heart block (01/02 0700) Resp:  [18] 18 (01/02 0439) BP: (91-105)/(56-60) 91/58 mmHg (01/02 0439) SpO2:  [98 %-100 %] 98 % (01/02 0439) Weight:  [207 lb 1.6 oz (93.94 kg)] 207 lb 1.6 oz (93.94 kg) (01/02 0439)  Pre op weight 99 kg Current Weight  12/01/15 207 lb 1.6 oz (93.94 kg)       Intake/Output from previous day: 01/01 0701 - 01/02 0700 In: 1200 [P.O.:1200] Out: 900 [Urine:900]   Physical Exam:  Cardiovascular: RRR, no murmur, valve click Pulmonary: Clear to auscultation bilaterally; no rales, wheezes, or rhonchi. Abdomen: Soft, non tender, bowel sounds present. Extremities: Bilateral lower extremity edema. Wounds: Clean and dry.  No erythema or signs of infection.  Lab Results: CBC:  Recent Labs  11/30/15 0600 12/01/15 0522  WBC 7.6 7.5  HGB 10.1* 10.3*  HCT 30.2* 31.8*  PLT 471* 480*   BMET:   Recent Labs  11/30/15 0600 12/01/15 0522  NA 134* 134*  K 4.6 4.8  CL 98* 99*  CO2 27 26  GLUCOSE 92 94  BUN 37* 33*  CREATININE 2.39* 2.07*  CALCIUM 9.6 9.8    PT/INR:  Lab Results  Component Value Date   INR 2.52* 12/01/2015   INR 2.51* 11/30/2015   INR 2.28* 11/29/2015   ABG:  INR: Will add last result for INR, ABG once components are confirmed Will add last 4 CBG results once components are confirmed  Assessment/Plan:  1. CV - SR/first degree heart block in the 60's. On Amiodarone 200 mg daily, and Coumadin. SBP remains in the 90's.INR remains 2.52. Will  give 10 mg tonight. 2.  Pulmonary - On room air. Encourage incentive spirometer 3. Volume Overload - On Lasix 40 mg daily 4.  Acute blood loss anemia - H and H 10.3 and 31.8 5. Creatinine decreased from 2.39 to 2.07 6. Will discuss discharge disposition with Dr. Prescott Gum  [facility] MPA-C 12/01/2015,8:22 AM   Patient appears stable and ready for discharge He will continue his diuresis with oral Lasix at home He will need home health nurse prothrombin time draw midweek with results to Coumadin clinic  patient examined and medical record reviewed,agree with above note. Tharon Aquas Trigt III 12/01/2015

## 2015-12-03 DIAGNOSIS — Z48812 Encounter for surgical aftercare following surgery on the circulatory system: Secondary | ICD-10-CM | POA: Diagnosis not present

## 2015-12-04 ENCOUNTER — Ambulatory Visit (INDEPENDENT_AMBULATORY_CARE_PROVIDER_SITE_OTHER): Payer: Managed Care, Other (non HMO) | Admitting: Pharmacist Clinician (PhC)/ Clinical Pharmacy Specialist

## 2015-12-04 DIAGNOSIS — Z7901 Long term (current) use of anticoagulants: Secondary | ICD-10-CM

## 2015-12-04 DIAGNOSIS — Z954 Presence of other heart-valve replacement: Secondary | ICD-10-CM

## 2015-12-04 LAB — POCT INR: INR: 4.9

## 2015-12-05 ENCOUNTER — Other Ambulatory Visit: Payer: Self-pay | Admitting: Thoracic Surgery (Cardiothoracic Vascular Surgery)

## 2015-12-05 DIAGNOSIS — Z7901 Long term (current) use of anticoagulants: Secondary | ICD-10-CM | POA: Insufficient documentation

## 2015-12-05 DIAGNOSIS — Z952 Presence of prosthetic heart valve: Secondary | ICD-10-CM

## 2015-12-06 ENCOUNTER — Other Ambulatory Visit: Payer: Self-pay | Admitting: Thoracic Surgery (Cardiothoracic Vascular Surgery)

## 2015-12-08 ENCOUNTER — Ambulatory Visit (INDEPENDENT_AMBULATORY_CARE_PROVIDER_SITE_OTHER): Payer: Self-pay | Admitting: Thoracic Surgery (Cardiothoracic Vascular Surgery)

## 2015-12-08 ENCOUNTER — Ambulatory Visit
Admission: RE | Admit: 2015-12-08 | Discharge: 2015-12-08 | Disposition: A | Discharge status: Home / Self Care | Payer: Managed Care, Other (non HMO) | Source: Ambulatory Visit | Attending: Thoracic Surgery (Cardiothoracic Vascular Surgery) | Admitting: Thoracic Surgery (Cardiothoracic Vascular Surgery)

## 2015-12-08 ENCOUNTER — Encounter: Payer: Self-pay | Admitting: Thoracic Surgery (Cardiothoracic Vascular Surgery)

## 2015-12-08 VITALS — BP 108/75 | HR 82 | Resp 16 | Ht 68.0 in | Wt 200.0 lb

## 2015-12-08 DIAGNOSIS — Z954 Presence of other heart-valve replacement: Secondary | ICD-10-CM

## 2015-12-08 DIAGNOSIS — Z952 Presence of prosthetic heart valve: Secondary | ICD-10-CM

## 2015-12-08 NOTE — Patient Instructions (Signed)
Stop taking Lasix (furosemide) but continue to watch your weight  Stop taking aspirin  Continue taking all other medications without change  You may continue to gradually increase your physical activity as tolerated.  Refrain from any heavy lifting or strenuous use of your arms and shoulders until at least 8 weeks from the time of your surgery, and avoid activities that cause increased pain in your chest on the side of your surgical incision.  Otherwise you may continue to increase activities without any particular limitations.  Increase the intensity and duration of physical activity gradually.

## 2015-12-08 NOTE — Progress Notes (Signed)
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  The patient's early postoperative recovery was notable only for sinus bradycardia, but he had a sudden VF arrest on the third postoperative day.  At the time the patient's temporary pacemaker was connected and there was suggestion that he may have suffered an R on T spike causing his arrest, but it took nearly 30 minutes for him to reestablish a stable rhythm despite the fact that his arrest was witnessed and he was aggressively resuscitated and cardioverted on numerous occasions. Follow-up transthoracic and transesophageal echocardiograms performed after the arrest revealed normal function of the mechanical prosthetic valve with severe dysfunction involving the inferior wall of the left ventricle.  Cardiac enzymes went quite high and he had some bradycardia and AV block, all suggestive of possible inferior wall myocardial infarction. However, left ventricular function completely normalized prior to hospital discharge. Plans for diagnostic cardiac catheterization were postponed because the patient developed acute non-oliguric renal failure.  The patient was followed carefully by Dr. Haroldine Laws and numerous consultants from the EP service. Because the patient's rhythm and left ventricular systolic function recovered completely, it was felt that the patient did not need defibrillator implantation.  The patient was ultimately discharged from the hospital on the 18th postoperative day.  Since hospital discharge his prothrombin time and Coumadin dose  has been monitored through the Coumadin clinic.  His most recent INR was 4.9 and his Coumadin dose was appropriately decreased. He returns to our office for routine follow-up today. The patient states that he feels remarkably good. He is walking every day, although for the last few days he has not been walking outside because of the extremely cold weather. He denies any exertional shortness of breath and he states that already his breathing feels better than it did prior to surgery. His appetite is good. He has mild soreness in the right side of his chest related to his recent surgery. He is only using pain relievers at night to help him sleep. He has not had any palpitations or dizzy spells. His weight is continued to gradually trending down since hospital discharge, most recently hanging around 200 pounds which is actually at least 15 pounds below his preoperative baseline weight.  Overall he feels remarkably good.  Current Outpatient Prescriptions  Medication Sig Dispense Refill  . acetaminophen (TYLENOL) 325 MG tablet Take 2 tablets (650 mg total) by mouth every 6 (six) hours as needed for mild pain.    Marland Kitchen amiodarone (PACERONE) 200 MG tablet Take 1 tablet (200 mg total) by mouth daily. 30 tablet 1  . aspirin EC 81 MG EC tablet Take 1 tablet (81 mg total) by mouth daily.    . furosemide (LASIX) 40 MG tablet Take 1 tablet (40 mg total) by mouth daily. 30 tablet 0  . oxyCODONE (OXY IR/ROXICODONE) 5 MG immediate release tablet Take 1-2 tablets (5-10 mg total) by mouth every 3 (three) hours as needed for severe pain. 30 tablet 0  . warfarin (COUMADIN) 5 MG tablet Please take Coumadin  10 mg every Monday, Wednesday, and Friday. Please take Coumadin 7.5 mg every Tuesday, Thursday, Saturday, and Sunday unless directed otherwise 60 tablet 1   No current facility-administered medications for this visit.      Physical Exam:   BP 108/75 mmHg  Pulse 82  Resp 16  Ht 5\' 8"  (1.727 m)  Wt 200 lb (90.719 kg)   BMI 30.42 kg/m2  SpO2 98%  General:  Well-appearing  Chest:   Clear to auscultation with symmetrical breath sounds  CV:   Regular rate and rhythm with mechanical heart valve sounds  Incisions:  Healing nicely  Abdomen:  Soft and nontender  Extremities:  Warm and well-perfused, no lower extremity edema  Diagnostic Tests:  CHEST 2 VIEW  COMPARISON: 11/21/2015  FINDINGS: Cardiomediastinal silhouette is stable. Again noted mild for valve prosthesis. No acute infiltrate or pleural effusion. No pulmonary edema. Mild degenerative changes lower thoracic spine.  IMPRESSION: No active cardiopulmonary disease. Mild degenerative changes lower thoracic spine.   Electronically Signed  By: Lahoma Crocker M.D.  On: 12/08/2015 [id number]   BASIC METABOLIC PANEL  Blood work obtained today demonstrates a serum creatinine 1.5 with B1 26, sodium 136, potassium 4.5, chloride 99, CO2 27 and glucose 88   Impression:  Patient looks remarkably good approximately 3 weeks following minimally invasive mitral valve replacement using a bileaflet mechanical prosthetic valve for rheumatic mitral valve disease followed by sudden VF arrest on the 3rd postoperative day.  He has recovered remarkably well under the circumstances.  He appears euvolemic on exam and his weight is well below his preoperative baseline. Serum creatinine continues to gradually trend down towards normal, although it remains above his preoperative baseline at this time.   Plan:  I have instructed the patient to stop taking Lasix and aspirin. He is scheduled for routine follow-up appointment with Dr. Haroldine Laws next week. Once his renal function gets back to baseline he will need to undergo diagnostic cardiac catheterization.  I have encouraged the patient to continue to gradually increase his activity but I have reminded him to avoid any sort of strenuous physical activity for now.  The patient will return for follow-up in 4 weeks. All of  his questions have been addressed.    Valentina Gu. Roxy Manns, MD 12/08/2015 4:44 PM

## 2015-12-09 ENCOUNTER — Other Ambulatory Visit: Payer: Self-pay | Admitting: *Deleted

## 2015-12-09 ENCOUNTER — Ambulatory Visit (INDEPENDENT_AMBULATORY_CARE_PROVIDER_SITE_OTHER): Payer: Managed Care, Other (non HMO) | Admitting: Pharmacist Clinician (PhC)/ Clinical Pharmacy Specialist

## 2015-12-09 DIAGNOSIS — N179 Acute kidney failure, unspecified: Secondary | ICD-10-CM

## 2015-12-09 DIAGNOSIS — Z952 Presence of prosthetic heart valve: Secondary | ICD-10-CM

## 2015-12-09 DIAGNOSIS — Z954 Presence of other heart-valve replacement: Secondary | ICD-10-CM

## 2015-12-09 DIAGNOSIS — Z7901 Long term (current) use of anticoagulants: Secondary | ICD-10-CM

## 2015-12-09 LAB — POCT INR: INR: 4.3

## 2015-12-10 ENCOUNTER — Telehealth: Payer: Self-pay

## 2015-12-10 NOTE — Telephone Encounter (Signed)
Home health orders faxed to [facility].  

## 2015-12-12 ENCOUNTER — Ambulatory Visit (INDEPENDENT_AMBULATORY_CARE_PROVIDER_SITE_OTHER): Payer: Managed Care, Other (non HMO) | Admitting: Pharmacist Clinician (PhC)/ Clinical Pharmacy Specialist

## 2015-12-12 DIAGNOSIS — Z7901 Long term (current) use of anticoagulants: Secondary | ICD-10-CM

## 2015-12-12 DIAGNOSIS — Z954 Presence of other heart-valve replacement: Secondary | ICD-10-CM

## 2015-12-12 LAB — POCT INR: INR: 2.4

## 2015-12-16 ENCOUNTER — Ambulatory Visit: Payer: Managed Care, Other (non HMO) | Admitting: Cardiovascular Disease

## 2015-12-16 ENCOUNTER — Encounter (HOSPITAL_COMMUNITY): Payer: Self-pay | Admitting: Internal Medicine

## 2015-12-16 ENCOUNTER — Ambulatory Visit (HOSPITAL_COMMUNITY)
Admission: RE | Admit: 2015-12-16 | Discharge: 2015-12-16 | Disposition: A | Discharge status: Home / Self Care | Payer: Managed Care, Other (non HMO) | Source: Ambulatory Visit | Attending: Internal Medicine | Admitting: Internal Medicine

## 2015-12-16 VITALS — BP 110/68 | HR 79 | Wt 208.0 lb

## 2015-12-16 DIAGNOSIS — Z7901 Long term (current) use of anticoagulants: Secondary | ICD-10-CM | POA: Diagnosis not present

## 2015-12-16 DIAGNOSIS — I890 Lymphedema, not elsewhere classified: Secondary | ICD-10-CM | POA: Diagnosis not present

## 2015-12-16 DIAGNOSIS — G4733 Obstructive sleep apnea (adult) (pediatric): Secondary | ICD-10-CM | POA: Insufficient documentation

## 2015-12-16 DIAGNOSIS — N170 Acute kidney failure with tubular necrosis: Secondary | ICD-10-CM | POA: Insufficient documentation

## 2015-12-16 DIAGNOSIS — I340 Nonrheumatic mitral (valve) insufficiency: Secondary | ICD-10-CM | POA: Diagnosis not present

## 2015-12-16 DIAGNOSIS — I469 Cardiac arrest, cause unspecified: Secondary | ICD-10-CM | POA: Diagnosis not present

## 2015-12-16 DIAGNOSIS — Z79899 Other long term (current) drug therapy: Secondary | ICD-10-CM | POA: Insufficient documentation

## 2015-12-16 DIAGNOSIS — Z87891 Personal history of nicotine dependence: Secondary | ICD-10-CM | POA: Diagnosis not present

## 2015-12-16 DIAGNOSIS — I5032 Chronic diastolic (congestive) heart failure: Secondary | ICD-10-CM | POA: Diagnosis present

## 2015-12-16 DIAGNOSIS — Z954 Presence of other heart-valve replacement: Secondary | ICD-10-CM | POA: Insufficient documentation

## 2015-12-16 LAB — CBC
HEMATOCRIT: 36.1 % — AB (ref 39.0–52.0)
Hemoglobin: 11.9 g/dL — ABNORMAL LOW (ref 13.0–17.0)
MCH: 29.2 pg (ref 26.0–34.0)
MCHC: 33 g/dL (ref 30.0–36.0)
MCV: 88.5 fL (ref 78.0–100.0)
PLATELETS: 260 10*3/uL (ref 150–400)
RBC: 4.08 MIL/uL — AB (ref 4.22–5.81)
RDW: 12.8 % (ref 11.5–15.5)
WBC: 6.8 10*3/uL (ref 4.0–10.5)

## 2015-12-16 LAB — COMPREHENSIVE METABOLIC PANEL
ALT: 11 U/L — AB (ref 17–63)
AST: 16 U/L (ref 15–41)
Albumin: 3.2 g/dL — ABNORMAL LOW (ref 3.5–5.0)
Alkaline Phosphatase: 93 U/L (ref 38–126)
Anion gap: 8 (ref 5–15)
BUN: 15 mg/dL (ref 6–20)
CHLORIDE: 105 mmol/L (ref 101–111)
CO2: 24 mmol/L (ref 22–32)
CREATININE: 1.23 mg/dL (ref 0.61–1.24)
Calcium: 9.4 mg/dL (ref 8.9–10.3)
GFR calc non Af Amer: >60 mL/min (ref >60)
Glucose, Bld: 86 mg/dL (ref 65–99)
POTASSIUM: 4.6 mmol/L (ref 3.5–5.1)
SODIUM: 137 mmol/L (ref 135–145)
Total Bilirubin: 0.1 mg/dL — ABNORMAL LOW (ref 0.3–1.2)
Total Protein: 7.1 g/dL (ref 6.5–8.1)

## 2015-12-16 MED ORDER — FUROSEMIDE 40 MG PO TABS: ORAL_TABLET | ORAL | Status: DC

## 2015-12-16 MED ORDER — ASPIRIN 81 MG PO TBEC: 81.0000 mg | DELAYED_RELEASE_TABLET | Freq: Every day | ORAL | Status: AC

## 2015-12-16 MED ORDER — DOCUSATE SODIUM 100 MG PO CAPS: 100.0000 mg | ORAL_CAPSULE | Freq: Two times a day (BID) | ORAL | Status: DC

## 2015-12-16 NOTE — Progress Notes (Signed)
[facility]  Patient ID: Oscar Brown, male   DOB: 1966-04-11, 50 y.o.   MRN: [id number]   HPI:  Oscar Brown is a 50 y/o male with OSA and mitral regurgitation. 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 On POD #3  he had a sudden VF arrest with nearly an hour of resuscitation time. Follow-up transthoracic and transesophageal echocardiograms performed after the arrest revealed normal function of the mechanical prosthetic valve with severe dysfunction involving the inferior wall of the left ventricle EF35-40%. Cardiac enzymes went quite high and he had some bradycardia and AV block, all suggestive of possible inferior wall myocardial infarction. However, left ventricular function completely normalized prior to hospital discharge. Plans for diagnostic cardiac catheterization were postponed because the patient developed acute non-oliguric renal failure with creatinine up to 7.Because the patient's rhythm and left ventricular systolic function recovered completely, it was felt that the patient did not need defibrillator implantation. The patient was ultimately discharged from the hospital on the 18th postoperative day.   He saw Dr. Roxy Manns last week and ASA and lasix were stopped.   Doing very well. Still with [facility] checking on him once a week but becoming more independent. Weight stable. Able to walk down the block without problem Taking coumadin with just minimal epistaxis. No edema, orthopnea or PND.  Dr. Roxy Manns stopped lasix last week. Now up 8 pounds. Also off ASA. He has mild soreness in the right side of his chest related to his recent surgery. He is only using pain relievers at night to help him sleep.    ROS: All systems negative except as listed in HPI, PMH and Problem List.  SH:  Social History   Social History  . Marital  Status: Legally Separated    Spouse Name: N/A  . Number of Children: 3  . Years of Education: N/A   Occupational History  . Not on file.   Social History Main Topics  . Smoking status: Former Smoker -- 0.10 packs/day for 9 years    Types: Cigarettes    Quit date: 09/05/2012  . Smokeless tobacco: Never Used  . Alcohol Use: No  . Drug Use: No  . Sexual Activity: Yes   Other Topics Concern  . Not on file   Social History Narrative    FH: No family history on file.  Past Medical History  Diagnosis Date  . 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  Complicated by chronic left lower extremity edema and occasional recurrent left lower extremity cellulitis   . Obstructive sleep apnea 12/14/2006    AHI 19.2/hr, desaturation to 75% on Polysomnography 02/26/2005.  Uses 11 cm H2O nocturnal nasal CPAP with good symptom control.   . Mitral regurgitation 10/06/2012  . Lymphedema early 87's  . DVT (deep venous thrombosis) ([place]) early 80's    left leg  . Incidental lung nodule, less than or equal to 14mm 11/10/2015    3 mm nodule right lung  . Acute diastolic congestive heart failure ([place]) 09/21/2015    takes Lasix daily  . 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 Acute renal failure (HCC)     ATN s/p cardiac arrest    Current Outpatient Prescriptions  Medication Sig Dispense Refill  .  amiodarone (PACERONE) 200 MG tablet Take 1 tablet (200 mg total) by mouth daily. 30 tablet 1  . warfarin (COUMADIN) 5 MG tablet Please take Coumadin 10 mg every Monday, Wednesday, and Friday. Please take Coumadin 7.5 mg every Tuesday, Thursday, Saturday, and Sunday unless directed otherwise 60 tablet 1   No current facility-administered medications for this encounter.    Filed Vitals:   12/16/15 1120  BP: 110/68  Pulse: 79    Weight: 208 lb (94.348 kg)  SpO2: 100%    PHYSICAL EXAM:  General:  Well appearing. No resp difficulty HEENT: normal Neck: supple. JVP flat. Carotids 2+ bilaterally; no bruits. No lymphadenopathy or thryomegaly appreciated. Cor: PMI normal. Regular rate & rhythm. Mechanical s1 No rubs, gallops or murmurs. Rchest wall scars well healed Lungs: clear Abdomen: soft, nontender, nondistended. No hepatosplenomegaly. No bruits or masses. Good bowel sounds. Extremities: no cyanosis, clubbing, rash, edema. Lymphedema left toes Neuro: alert & orientedx3, cranial nerves grossly intact. Moves all 4 extremities w/o difficulty. Affect pleasant.   ASSESSMENT & PLAN: 1. Mitral regurgitation s/p mechanical MVR    --normal coronaries pre-op 2. Prolonged post-cardiac arrest - VT/VF    --possible acute RCA infarct 3. Acute renal failure due to #2 4. Lymphedema in left leg  Doing great. Volume status looks good. Continue current medicines. Will check BMET. If renal function stable will proceed with CT coronary angio to further assess coronaries post arrest. Will continue amiodarone for one more month. Have him take lasix as needed. Given mechanical MVR continue coumadin. Add back ECASA 81.  Nesha Counihan,MD 5:07 PM

## 2015-12-16 NOTE — Patient Instructions (Addendum)
Take Colace 100mg  twice a day.  Take lasix 40mg  a week as needed.  Take Aspirin 81mg  daily.  Routine lab work today.(cmet cbc) Will notify you of abnormal results  Follow up: 1 month with [facility] (Dr.Bensimhon)

## 2015-12-17 ENCOUNTER — Telehealth: Payer: Self-pay

## 2015-12-17 NOTE — Telephone Encounter (Signed)
Faxed signed INR orders for 12/09/2015, 12/12/2015, and 12/19/2015 to [facility].

## 2015-12-19 ENCOUNTER — Ambulatory Visit (INDEPENDENT_AMBULATORY_CARE_PROVIDER_SITE_OTHER): Payer: Managed Care, Other (non HMO) | Admitting: Podiatry

## 2015-12-19 ENCOUNTER — Encounter: Payer: Self-pay | Admitting: Podiatry

## 2015-12-19 ENCOUNTER — Ambulatory Visit (INDEPENDENT_AMBULATORY_CARE_PROVIDER_SITE_OTHER): Payer: Managed Care, Other (non HMO) | Admitting: Pharmacist Clinician (PhC)/ Clinical Pharmacy Specialist

## 2015-12-19 ENCOUNTER — Telehealth: Payer: Self-pay | Admitting: Cardiovascular Disease

## 2015-12-19 VITALS — BP 136/86 | HR 75 | Resp 14

## 2015-12-19 DIAGNOSIS — Z954 Presence of other heart-valve replacement: Secondary | ICD-10-CM

## 2015-12-19 DIAGNOSIS — L84 Corns and callosities: Secondary | ICD-10-CM | POA: Diagnosis not present

## 2015-12-19 DIAGNOSIS — B351 Tinea unguium: Secondary | ICD-10-CM | POA: Diagnosis not present

## 2015-12-19 DIAGNOSIS — Z7901 Long term (current) use of anticoagulants: Secondary | ICD-10-CM

## 2015-12-19 DIAGNOSIS — M79676 Pain in unspecified toe(s): Secondary | ICD-10-CM | POA: Diagnosis not present

## 2015-12-19 LAB — POCT INR: INR: 4.9

## 2015-12-19 NOTE — Progress Notes (Addendum)
   Subjective:    Patient ID: Oscar Brown, male    DOB: 1966-10-31, 50 y.o.   MRN: [id number]  HPI this patient presents to the office for an evaluation of his feet. He says he has long thick painful ingrown toenails on both feet. He also says that he has painful calluses on both feet. He admits to treating the callus on the outside of his left foot with salicylic acid. He states the skin lesion has been extremely painful and sore walking and wearing his shoes. No evidence of any redness or swelling or blistering noted at the site. He has a past history of lymphedema on his left lower extremity. He presents to the office for an evaluation and treatment    Review of Systems  Cardiovascular: Positive for leg swelling.       Objective:   Physical Exam GENERAL APPEARANCE: Alert, conversant. Appropriately groomed. No acute distress.  VASCULAR: Pedal pulses palpable at  [facility] and PT bilateral.  Capillary refill time is immediate to all digits,  Normal temperature gradient.  Digital hair growth is present bilateral  NEUROLOGIC: sensation is normal to 5.07 monofilament at 5/5 sites bilateral.  Light touch is intact bilateral, Muscle strength normal.  MUSCULOSKELETAL: acceptable muscle strength, tone and stability bilateral.  Intrinsic muscluature intact bilateral.  Rectus appearance of foot and digits noted bilateral. HAV B/L.  DERMATOLOGIC: skin color, texture, and turgor are within normal limits.  No preulcerative lesions or ulcers  are seen, no interdigital maceration noted.  No open lesions present.   No drainage noted. Calluses are present on the lateral aspect of fifth metabase and sub fifth metatarsal head right foot.  NAILS  Thick disfigured discolored nails all toes both feet.        Assessment & Plan:  Onychomycosis B/L  Callus B/L  IE  Debride nails.  Debride callus.  Told patient to stop using acid on calluses.     Gardiner Barefoot DPM

## 2015-12-19 NOTE — Telephone Encounter (Signed)
Received [employer] Attending Provider Statement via fax for Dr Oval Linsey to review, complete and sign.  Sent to [place] @ [employer] for letter/pkt to be sent to patient for AUTH/Pmt.  Sent to [place] on 12/19/15 via [place]. lp

## 2015-12-24 ENCOUNTER — Ambulatory Visit (INDEPENDENT_AMBULATORY_CARE_PROVIDER_SITE_OTHER): Payer: Managed Care, Other (non HMO) | Admitting: Pharmacist Clinician (PhC)/ Clinical Pharmacy Specialist

## 2015-12-24 DIAGNOSIS — Z7901 Long term (current) use of anticoagulants: Secondary | ICD-10-CM | POA: Diagnosis not present

## 2015-12-24 DIAGNOSIS — Z954 Presence of other heart-valve replacement: Secondary | ICD-10-CM

## 2015-12-24 LAB — POCT INR: INR: 3

## 2015-12-26 ENCOUNTER — Encounter: Payer: Self-pay | Admitting: Internal Medicine

## 2015-12-26 ENCOUNTER — Telehealth: Payer: Self-pay | Admitting: Cardiovascular Disease

## 2015-12-26 ENCOUNTER — Ambulatory Visit: Payer: Managed Care, Other (non HMO) | Admitting: Internal Medicine

## 2015-12-26 NOTE — Telephone Encounter (Signed)
New Message  Pt c/o medication issue: 1. Name of Medication: Coumadin   4. What is your medication issue? Pt request a call back to discuss how he should take his medication over the weekend. Please call back to discuss

## 2015-12-26 NOTE — Telephone Encounter (Signed)
Follow up      Calling to get the dosage of coumadin he is to take over the weekend

## 2015-12-26 NOTE — Telephone Encounter (Signed)
Clarified dosing schedule with patient

## 2015-12-30 ENCOUNTER — Encounter (HOSPITAL_COMMUNITY): Payer: Self-pay | Admitting: *Deleted

## 2015-12-30 ENCOUNTER — Ambulatory Visit (INDEPENDENT_AMBULATORY_CARE_PROVIDER_SITE_OTHER): Payer: Managed Care, Other (non HMO) | Admitting: Pharmacist Clinician (PhC)/ Clinical Pharmacy Specialist

## 2015-12-30 DIAGNOSIS — Z954 Presence of other heart-valve replacement: Secondary | ICD-10-CM | POA: Diagnosis not present

## 2015-12-30 DIAGNOSIS — Z7901 Long term (current) use of anticoagulants: Secondary | ICD-10-CM

## 2015-12-30 LAB — POCT INR: INR: 3.5

## 2015-12-30 NOTE — Progress Notes (Signed)
Referral for Cardiac Rehab, faxed

## 2016-01-02 ENCOUNTER — Telehealth: Payer: Self-pay | Admitting: Cardiovascular Disease

## 2016-01-02 NOTE — Telephone Encounter (Signed)
01/02/2016 Received [employer] Disability Form back through inter-office mail from [facility] for Dr. Oval Linsey, she was out of office so I gave form to [facility] Dr. Oval Linsey nurse for completion.  cbr

## 2016-01-05 ENCOUNTER — Ambulatory Visit (INDEPENDENT_AMBULATORY_CARE_PROVIDER_SITE_OTHER): Payer: Self-pay | Admitting: Thoracic Surgery (Cardiothoracic Vascular Surgery)

## 2016-01-05 ENCOUNTER — Encounter: Payer: Self-pay | Admitting: Thoracic Surgery (Cardiothoracic Vascular Surgery)

## 2016-01-05 ENCOUNTER — Other Ambulatory Visit: Payer: Self-pay | Admitting: *Deleted

## 2016-01-05 VITALS — BP 126/83 | HR 73 | Resp 20 | Ht 68.0 in | Wt 208.0 lb

## 2016-01-05 DIAGNOSIS — Z952 Presence of prosthetic heart valve: Secondary | ICD-10-CM

## 2016-01-05 DIAGNOSIS — I340 Nonrheumatic mitral (valve) insufficiency: Secondary | ICD-10-CM

## 2016-01-05 DIAGNOSIS — I5032 Chronic diastolic (congestive) heart failure: Secondary | ICD-10-CM

## 2016-01-05 DIAGNOSIS — Z954 Presence of other heart-valve replacement: Secondary | ICD-10-CM

## 2016-01-05 DIAGNOSIS — N179 Acute kidney failure, unspecified: Secondary | ICD-10-CM

## 2016-01-05 DIAGNOSIS — R911 Solitary pulmonary nodule: Secondary | ICD-10-CM

## 2016-01-05 LAB — BASIC METABOLIC PANEL
BUN: 15 mg/dL (ref 7–25)
CALCIUM: 9.2 mg/dL (ref 8.6–10.3)
CHLORIDE: 102 mmol/L (ref 98–110)
CO2: 27 mmol/L (ref 20–31)
CREATININE: 1.13 mg/dL (ref 0.60–1.35)
Glucose, Bld: 75 mg/dL (ref 65–99)
Potassium: 4.6 mmol/L (ref 3.5–5.3)
Sodium: 137 mmol/L (ref 135–146)

## 2016-01-05 NOTE — Patient Instructions (Signed)
You may continue to gradually increase your physical activity as tolerated.  Refrain from any heavy lifting or strenuous use of your arms and shoulders until at least 8 weeks from the time of your surgery, and avoid activities that cause increased pain in your chest on the side of your surgical incision.  Otherwise you may continue to increase activities without any particular limitations.  Increase the intensity and duration of physical activity gradually.  Continue all previous medications without any changes at this time  

## 2016-01-05 NOTE — Progress Notes (Signed)
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 He was last seen in our office on 12/08/2015.  Since then he has continued to do remarkably well. He was seen in follow-up by Dr. Haroldine Laws on 12/16/2015.  At that time his creatinine level had returned to near baseline at 1.23.  He has been scheduled for a cardiac gated CT angiogram of the heart later this month. The patient states that he feels remarkably well. He no longer has any significant soreness in his chest. He is walking every day and he reports no exertional shortness of breath. He states that his breathing feels better than it did prior to his recent surgery.He has not had any palpitations or dizzy spells. Overall he has no complaints.   Current Outpatient Prescriptions  Medication Sig Dispense Refill  . amiodarone (PACERONE) 200 MG tablet Take 1 tablet (200 mg total) by mouth daily. 30 tablet 1  . aspirin (ASPIR-81) 81 MG EC tablet Take 1 tablet (81 mg total) by mouth daily. Swallow whole. (Patient not taking: Reported on 01/05/2016) 30 tablet 12  . docusate sodium (COLACE) 100 MG capsule Take 1 capsule (100 mg total) by mouth 2 (two) times daily. 10 capsule 0  . furosemide (LASIX) 40 MG tablet Take 40mg  a week as needed (Patient not taking: Reported on 01/05/2016) 5 tablet 3  . warfarin (COUMADIN) 5 MG tablet Please take Coumadin 10 mg every Monday, Wednesday, and Friday. Please take Coumadin 7.5 mg every Tuesday, Thursday, Saturday, and Sunday unless directed otherwise 60 tablet 1   No current facility-administered medications for this visit.       Physical Exam:   BP 126/83 mmHg  Pulse 73  Resp 20  Ht 5\' 8"  (1.727 m)  Wt 208 lb (94.348 kg)  BMI 31.63 kg/m2  SpO2   General:  Well-appearing  Chest:   Clear to auscultation  CV:   Mechanical heart valve sounds with regular rate and rhythm  Incisions:  Healing nicely  Abdomen:  Soft and nontender  Extremities:  Warm and well-perfused  Diagnostic Tests:  CHEST 2 VIEW  COMPARISON: 11/21/2015  FINDINGS: Cardiomediastinal silhouette is stable. Again noted mild for valve prosthesis. No acute infiltrate or pleural effusion. No pulmonary edema. Mild degenerative changes lower thoracic spine.  IMPRESSION: No active cardiopulmonary disease. Mild degenerative changes lower thoracic spine.   Electronically Signed  By: Lahoma Crocker M.D.  On: 12/08/2015 16:11    Impression:  Patient is doing exceptionally well more than 6 weeks following minimally invasive mitral valve replacement using a bileaflet mechanical prosthetic valve.   Plan:  We have not recommended any changes to the patient's current medications at this time. I've encouraged the patient to continue to gradually increase his physical activity as tolerated. From a surgical standpoint I think he could return to driving an automobile, but I would wait until after he undergoes CT angiogram and has been cleared from a cardiac standpoint by Dr. Haroldine Laws. All of his questions have been addressed. The patient will return in 2 months for follow-up or sooner should  specific problems or difficulties arise.   Valentina Gu. Roxy Manns, MD 01/05/2016 4:52 PM

## 2016-01-06 ENCOUNTER — Telehealth: Payer: Self-pay | Admitting: Cardiovascular Disease

## 2016-01-06 NOTE — Telephone Encounter (Signed)
Received Signed Attending Provider Statement [facility]) back from Dr Oval Linsey.  Notified patient that forms were complete and signed.  Patient asked that we fax to [facility] and mail him a copy.  Faxed 01/06/16,  Mailed 01/06/16. lp

## 2016-01-12 ENCOUNTER — Ambulatory Visit (INDEPENDENT_AMBULATORY_CARE_PROVIDER_SITE_OTHER): Payer: Managed Care, Other (non HMO) | Admitting: Pharmacist Clinician (PhC)/ Clinical Pharmacy Specialist

## 2016-01-12 DIAGNOSIS — Z7901 Long term (current) use of anticoagulants: Secondary | ICD-10-CM

## 2016-01-12 DIAGNOSIS — Z954 Presence of other heart-valve replacement: Secondary | ICD-10-CM | POA: Diagnosis not present

## 2016-01-12 LAB — POCT INR: INR: 3.1

## 2016-01-22 ENCOUNTER — Encounter (HOSPITAL_COMMUNITY): Payer: Self-pay | Admitting: *Deleted

## 2016-01-22 ENCOUNTER — Ambulatory Visit (HOSPITAL_BASED_OUTPATIENT_CLINIC_OR_DEPARTMENT_OTHER)
Admission: RE | Admit: 2016-01-22 | Discharge: 2016-01-22 | Disposition: A | Discharge status: Home / Self Care | Payer: Managed Care, Other (non HMO) | Source: Ambulatory Visit | Attending: Internal Medicine | Admitting: Internal Medicine

## 2016-01-22 ENCOUNTER — Ambulatory Visit (HOSPITAL_COMMUNITY)
Admission: RE | Admit: 2016-01-22 | Discharge: 2016-01-22 | Disposition: A | Discharge status: Home / Self Care | Payer: Managed Care, Other (non HMO) | Source: Ambulatory Visit | Attending: Internal Medicine | Admitting: Internal Medicine

## 2016-01-22 VITALS — BP 116/74 | HR 66 | Wt 218.2 lb

## 2016-01-22 DIAGNOSIS — Z954 Presence of other heart-valve replacement: Secondary | ICD-10-CM | POA: Diagnosis not present

## 2016-01-22 DIAGNOSIS — I469 Cardiac arrest, cause unspecified: Secondary | ICD-10-CM

## 2016-01-22 DIAGNOSIS — I4892 Unspecified atrial flutter: Secondary | ICD-10-CM

## 2016-01-22 DIAGNOSIS — I5032 Chronic diastolic (congestive) heart failure: Secondary | ICD-10-CM

## 2016-01-22 DIAGNOSIS — Z952 Presence of prosthetic heart valve: Secondary | ICD-10-CM | POA: Insufficient documentation

## 2016-01-22 DIAGNOSIS — I517 Cardiomegaly: Secondary | ICD-10-CM | POA: Diagnosis present

## 2016-01-22 NOTE — Patient Instructions (Signed)
Stop Amiodarone.   Your Provider requests you have a Coronary CT scan we will call you to schedule appointment.  Your provider has referred you to cardiac rehab they will call you to schedule appointment.   Follow up in 4 months with Dr.Bensimhon

## 2016-01-22 NOTE — Progress Notes (Signed)
Patient ID: Oscar Brown, male   DOB: [birth year], 50 y.o.   MRN: [id number]  [facility]    HPI:  Oscar Brown is a 50 y/o male with OSA and mitral regurgitation. 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 On POD #3  he had a sudden VF arrest with nearly an hour of resuscitation time. Follow-up transthoracic and transesophageal echocardiograms performed after the arrest revealed normal function of the mechanical prosthetic valve with severe dysfunction involving the inferior wall of the left ventricle EF35-40%. Cardiac enzymes went quite high and he had some bradycardia and AV block, all suggestive of possible inferior wall myocardial infarction. However, left ventricular function completely normalized prior to hospital discharge. Plans for diagnostic cardiac catheterization were postponed because the patient developed acute non-oliguric renal failure with creatinine up to 7.Because the patient's rhythm and left ventricular systolic function recovered completely, it was felt that the patient did not need defibrillator implantation. The patient was ultimately discharged from the hospital on the 18th postoperative day.   He saw Dr. Roxy Manns last week and ASA and lasix were stopped.   Doing very well. Walking every day. 45 minutes. No CP or SOB Appetite improved has put some weight back on. Taking coumadin without bleeding. No edema, orthopnea or PND.  Takes lasix 2x/week. Monday and Friday. No dizziness. No palpitations.   Echo 2/17 (reviewed personally) EF 45-50% inferior HK. MV ok    ROS: All systems negative except as listed in HPI, PMH and Problem List.  SH:  Social History   Social History  . Marital Status: Legally Separated    Spouse Name: N/A  . Number of Children: 3  . Years of Education: N/A   Occupational History  .  Not on file.   Social History Main Topics  . Smoking status: Former Smoker -- 0.10 packs/day for 9 years    Types: Cigarettes    Quit date: 09/05/2012  . Smokeless tobacco: Never Used  . Alcohol Use: No  . Drug Use: No  . Sexual Activity: Yes   Other Topics Concern  . Not on file   Social History Narrative    FH: No family history on file.  Past Medical History  Diagnosis Date  . 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  Complicated by chronic left lower extremity edema and occasional recurrent left lower extremity cellulitis   . Obstructive sleep apnea 12/14/2006    AHI 19.2/hr, desaturation to 75% on Polysomnography 02/26/2005.  Uses 11 cm H2O nocturnal nasal CPAP with good symptom control.   . Mitral regurgitation 10/06/2012  . Lymphedema early 51's  . DVT (deep venous thrombosis) ([place]) early 80's    left leg  . Incidental lung nodule, less than or equal to 58mm 11/10/2015    3 mm nodule right lung  . Acute diastolic congestive heart failure ([place]) 09/21/2015    takes Lasix daily  . 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 Acute renal failure (HCC)     ATN s/p cardiac arrest    Current Outpatient Prescriptions  Medication Sig Dispense Refill  . amiodarone (PACERONE) 200 MG tablet Take 1 tablet (200 mg total) by mouth daily. 30 tablet 1  . aspirin (ASPIR-81) 81 MG EC tablet  Take 1 tablet (81 mg total) by mouth daily. Swallow whole. 30 tablet 12  . furosemide (LASIX) 40 MG tablet Take 40 mg by mouth 2 (two) times a week.    . warfarin (COUMADIN) 5 MG tablet Please take Coumadin 10 mg every Monday, Wednesday, and Friday. Please take Coumadin 7.5 mg every Tuesday, Thursday, Saturday, and Sunday unless directed otherwise 60 tablet 1   No current facility-administered medications for this encounter.     Filed Vitals:   01/22/16 1212  BP: 116/74  Pulse: 66  Weight: 218 lb 4 oz (98.998 kg)  SpO2: 100%    PHYSICAL EXAM:  General:  Well appearing. No resp difficulty HEENT: normal Neck: supple. JVP flat. Carotids 2+ bilaterally; no bruits. No lymphadenopathy or thryomegaly appreciated. Cor: PMI normal. Regular rate & rhythm. Mechanical s1 No rubs, gallops or murmurs. Rchest wall scars well healed Lungs: clear Abdomen: soft, nontender, nondistended. No hepatosplenomegaly. No bruits or masses. Good bowel sounds. Extremities: no cyanosis, clubbing, rash, edema. Lymphedema left leg Neuro: alert & orientedx3, cranial nerves grossly intact. Moves all 4 extremities w/o difficulty. Affect pleasant.   ASSESSMENT & PLAN: 1. Mitral regurgitation s/p mechanical MVR    --normal coronaries pre-op 2. Prolonged in-hospital post-cardiac arrest - VT/VF    --due to acute RCA infarct 3. Acute renal failure due to #2 - resolved 4. Lymphedema in left leg  Doing great. Volume status looks good. Continue current medicines. Stop amiodarone. Echo images confirm suspicion of previous RCA infarct leading to VF arrest in hospital. Get coronary CT scan to further evaluate coronary anatomy. Refer cardiac rehab.   Bensimhon, Daniel,MD 1:11 PM

## 2016-01-22 NOTE — Progress Notes (Signed)
  Echocardiogram 2D Echocardiogram has been performed.  Diamond Nickel 01/22/2016, 11:54 AM

## 2016-01-24 ENCOUNTER — Other Ambulatory Visit: Payer: Self-pay | Admitting: Physician Assistant

## 2016-01-26 ENCOUNTER — Ambulatory Visit (INDEPENDENT_AMBULATORY_CARE_PROVIDER_SITE_OTHER): Payer: Managed Care, Other (non HMO) | Admitting: Pharmacist Clinician (PhC)/ Clinical Pharmacy Specialist

## 2016-01-26 DIAGNOSIS — Z7901 Long term (current) use of anticoagulants: Secondary | ICD-10-CM | POA: Diagnosis not present

## 2016-01-26 DIAGNOSIS — Z954 Presence of other heart-valve replacement: Secondary | ICD-10-CM

## 2016-01-26 DIAGNOSIS — I4892 Unspecified atrial flutter: Secondary | ICD-10-CM

## 2016-01-26 LAB — POCT INR: INR: 2.5

## 2016-01-27 DIAGNOSIS — Z736 Limitation of activities due to disability: Secondary | ICD-10-CM

## 2016-01-30 ENCOUNTER — Ambulatory Visit (HOSPITAL_COMMUNITY)
Admission: RE | Admit: 2016-01-30 | Discharge: 2016-01-30 | Disposition: A | Discharge status: Home / Self Care | Payer: Managed Care, Other (non HMO) | Source: Ambulatory Visit | Attending: Internal Medicine | Admitting: Internal Medicine

## 2016-01-30 DIAGNOSIS — I5032 Chronic diastolic (congestive) heart failure: Secondary | ICD-10-CM

## 2016-02-03 ENCOUNTER — Telehealth (HOSPITAL_COMMUNITY): Payer: Self-pay | Admitting: *Deleted

## 2016-02-03 NOTE — Telephone Encounter (Signed)
Radiology called and stated pts CT would have to be rescheduled he has an allergy to the contrast. He needs 13hr pre meds. Please advise

## 2016-02-05 ENCOUNTER — Encounter (HOSPITAL_COMMUNITY): Payer: Self-pay

## 2016-02-05 NOTE — Progress Notes (Signed)
[employer] faxed disability claim update with work status, ov notes, restrictions, limitations, and duration. Per patient, would like to continue full time work duty with restrictions as follows... No lifting, pulling, pushing greater than 30 lbs. Rest breaks as needed for shortness of breath and/or rapid heart rate. Will fax this information to provided # [phone] Copy of request scanned into patient's electronic medical record.  Renee Pain

## 2016-02-05 NOTE — Progress Notes (Signed)
Medical record request received for patient from [place]. All available records from CHF clinic visits/providers faxed to provided # [phone] Copy of request scanned into patient's electronic medical record.  Renee Pain

## 2016-02-06 ENCOUNTER — Encounter: Payer: Managed Care, Other (non HMO) | Admitting: Pharmacist Clinician (PhC)/ Clinical Pharmacy Specialist

## 2016-02-09 ENCOUNTER — Encounter: Payer: Managed Care, Other (non HMO) | Admitting: Pharmacist Clinician (PhC)/ Clinical Pharmacy Specialist

## 2016-02-09 ENCOUNTER — Ambulatory Visit (INDEPENDENT_AMBULATORY_CARE_PROVIDER_SITE_OTHER): Payer: Managed Care, Other (non HMO) | Admitting: Pharmacist Clinician (PhC)/ Clinical Pharmacy Specialist

## 2016-02-09 DIAGNOSIS — I4892 Unspecified atrial flutter: Secondary | ICD-10-CM | POA: Diagnosis not present

## 2016-02-09 DIAGNOSIS — Z954 Presence of other heart-valve replacement: Secondary | ICD-10-CM

## 2016-02-09 DIAGNOSIS — Z7901 Long term (current) use of anticoagulants: Secondary | ICD-10-CM

## 2016-02-09 LAB — POCT INR: INR: 2.8

## 2016-02-09 NOTE — Telephone Encounter (Signed)
Please arrange premedication for CT per radiology protocol.  Thanks.

## 2016-02-10 NOTE — Telephone Encounter (Signed)
I am not aware of any premedication for CT protocol. Please advise. Please see prior notes

## 2016-02-13 ENCOUNTER — Other Ambulatory Visit: Payer: Self-pay | Admitting: Physician Assistant

## 2016-03-01 ENCOUNTER — Other Ambulatory Visit: Payer: Self-pay | Admitting: Physician Assistant

## 2016-03-03 ENCOUNTER — Other Ambulatory Visit: Payer: Self-pay | Admitting: Pharmacist Clinician (PhC)/ Clinical Pharmacy Specialist

## 2016-03-03 ENCOUNTER — Other Ambulatory Visit: Payer: Self-pay | Admitting: *Deleted

## 2016-03-03 ENCOUNTER — Other Ambulatory Visit (HOSPITAL_COMMUNITY): Payer: Self-pay | Admitting: *Deleted

## 2016-03-03 MED ORDER — FUROSEMIDE 40 MG PO TABS: 40.0000 mg | ORAL_TABLET | ORAL | Status: DC

## 2016-03-03 MED ORDER — WARFARIN SODIUM 5 MG PO TABS: ORAL_TABLET | ORAL | Status: DC

## 2016-03-04 NOTE — Telephone Encounter (Signed)
°*  STAT* If patient is at the pharmacy, call can be transferred to refill team.   1. Which medications need to be refilled? (please list name of each medication and dose if known) Coumadin 5 mg  2. Which pharmacy/location (including street and city if local pharmacy) is medication to be sent to? [facility] [place]  3. Do they need a 30 day or 90 day supply? [place]

## 2016-03-12 ENCOUNTER — Encounter: Payer: Managed Care, Other (non HMO) | Admitting: Pharmacist Clinician (PhC)/ Clinical Pharmacy Specialist

## 2016-03-12 ENCOUNTER — Ambulatory Visit (INDEPENDENT_AMBULATORY_CARE_PROVIDER_SITE_OTHER): Payer: Managed Care, Other (non HMO) | Admitting: Pharmacist Clinician (PhC)/ Clinical Pharmacy Specialist

## 2016-03-12 DIAGNOSIS — Z7901 Long term (current) use of anticoagulants: Secondary | ICD-10-CM

## 2016-03-12 DIAGNOSIS — I4892 Unspecified atrial flutter: Secondary | ICD-10-CM | POA: Diagnosis not present

## 2016-03-12 DIAGNOSIS — Z954 Presence of other heart-valve replacement: Secondary | ICD-10-CM

## 2016-03-12 LAB — POCT INR: INR: 2.4

## 2016-03-15 ENCOUNTER — Ambulatory Visit: Payer: Managed Care, Other (non HMO) | Admitting: Thoracic Surgery (Cardiothoracic Vascular Surgery)

## 2016-03-26 ENCOUNTER — Ambulatory Visit: Payer: Managed Care, Other (non HMO) | Admitting: Podiatry

## 2016-04-02 ENCOUNTER — Ambulatory Visit (INDEPENDENT_AMBULATORY_CARE_PROVIDER_SITE_OTHER): Payer: Managed Care, Other (non HMO) | Admitting: Pharmacist Clinician (PhC)/ Clinical Pharmacy Specialist

## 2016-04-02 DIAGNOSIS — Z954 Presence of other heart-valve replacement: Secondary | ICD-10-CM

## 2016-04-02 DIAGNOSIS — Z7901 Long term (current) use of anticoagulants: Secondary | ICD-10-CM

## 2016-04-02 DIAGNOSIS — I4892 Unspecified atrial flutter: Secondary | ICD-10-CM | POA: Diagnosis not present

## 2016-04-02 LAB — POCT INR: INR: 2.8

## 2016-04-05 ENCOUNTER — Encounter: Payer: Self-pay | Admitting: Thoracic Surgery (Cardiothoracic Vascular Surgery)

## 2016-04-05 ENCOUNTER — Ambulatory Visit (INDEPENDENT_AMBULATORY_CARE_PROVIDER_SITE_OTHER): Payer: Managed Care, Other (non HMO) | Admitting: Thoracic Surgery (Cardiothoracic Vascular Surgery)

## 2016-04-05 VITALS — BP 116/78 | HR 67 | Resp 20 | Ht 68.0 in | Wt 228.0 lb

## 2016-04-05 DIAGNOSIS — R911 Solitary pulmonary nodule: Secondary | ICD-10-CM

## 2016-04-05 DIAGNOSIS — I5032 Chronic diastolic (congestive) heart failure: Secondary | ICD-10-CM

## 2016-04-05 DIAGNOSIS — Z954 Presence of other heart-valve replacement: Secondary | ICD-10-CM | POA: Diagnosis not present

## 2016-04-05 DIAGNOSIS — I340 Nonrheumatic mitral (valve) insufficiency: Secondary | ICD-10-CM | POA: Diagnosis not present

## 2016-04-05 NOTE — Progress Notes (Signed)
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 The patient's early postoperative recovery was notable for a sudden VF arrest on the third postoperative day. He ultimately recovered uneventfully but follow-up echocardiograms have suggested the possibility of an inferior wall myocardial infarction.  Preoperative diagnostic cardiac catheterization revealed codominant coronary circulation with moderate nonobstructive coronary artery disease, but he never underwent diagnostic cardiac catheterization after his VF arrest.  He was last seen here in our office on 01/05/2016 at which time he was doing quite well. Since then he has been seen in follow-up by Dr. Haroldine Laws on 01/22/2016. Plans were made for the patient undergo cardiac gated CT angiogram of the heart but the patient states that when he went for the procedure he did not receive preprocedure steroids. The CT angiogram was canceled because of the patient's reported allergy to IV contrast. He states that he was told the scan would be rescheduled that he has not received a phone call. He returns to our office for routine follow-up today. He states that he is doing exceptionally well. He is back at work and he is quite active physically. He states that his exercise tolerance is better than it was prior to surgery. He denies any symptoms of exertional shortness of breath. He walks every day and spends a fair amount of time doing yardwork. He has not had problems with Coumadin  management and overall he is to light with his progress. Overall he feels quite well, notably much better than he did prior to surgery.   Current Outpatient Prescriptions  Medication Sig Dispense Refill  . aspirin (ASPIR-81) 81 MG EC tablet Take 1 tablet (81 mg total) by mouth daily. Swallow whole. 30 tablet 12  . furosemide (LASIX) 40 MG tablet Take 1 tablet (40 mg total) by mouth 2 (two) times a week. 15 tablet 3  . warfarin (COUMADIN) 5 MG tablet Please take Coumadin 10 mg every Monday, Wednesday, and Friday. Please take Coumadin 7.5 mg every Tuesday, Thursday, Saturday, and Sunday unless directed otherwise 60 tablet 1  . warfarin (COUMADIN) 5 MG tablet Take 1 to 1.5 tablets by mouth daily as directed by coumadin clinic 45 tablet 1   No current facility-administered medications for this visit.      Physical Exam:   BP 116/78 mmHg  Pulse 67  Resp 20  Ht 5\' 8"  (1.727 m)  Wt 228 lb (103.42 kg)  BMI 34.68 kg/m2  SpO2 98%  General:  Well-appearing  Chest:   Clear to auscultation  CV:   Regular rate and rhythm with mechanical heart valve sounds  Incisions:  Completely healed  Abdomen:  Soft and nontender  Extremities:  Warm and well-perfused  Diagnostic Tests:  Transthoracic Echocardiography  Patient: Oscar Brown, [place] MR #: [id number] Study Date: 01/22/2016 Gender: M Age: 60 Height: 172.7 cm Weight: 94.3 kg BSA: 2.16 m^2 Pt. Status: Room:  SONOGRAPHER Diamond Nickel REFERRING Oval Linsey D ORDERING Bensimhon, Daniel PERFORMING [facility], Outpatient  cc:  -------------------------------------------------------------------  LV EF: 45% - 50%  ------------------------------------------------------------------- Indications: CHF - 428.0.  ------------------------------------------------------------------- History: PMH: Obstructive sleep apnea.Lymphedema. Mitral valve replacement. Acute renal  failure.  ------------------------------------------------------------------- Study Conclusions  - Left ventricle: Inferior and apical hypokinesis The cavity size  was mildly dilated. Wall thickness was normal. Systolic function  was mildly reduced. The estimated ejection fraction was in the  range of 45% to 50%. - Mitral valve: Mechanical MVR with mild central MR no perivalvular  regurgitation and normal diastolic gradients. - Left atrium: The atrium was mildly dilated. - Atrial septum: No defect or patent foramen ovale was identified.  Transthoracic echocardiography. M-mode, complete 2D, spectral Doppler, and color Doppler. Birthdate: Patient birthdate: [birth year]. Age: Patient is 50 yr old. Sex: Gender: male. BMI: 31.6 kg/m^2. Blood pressure: 135/89 Patient status: Outpatient. Study date: Study date: 01/22/2016. Study time: 11:04 AM. Location: Echo laboratory.  -------------------------------------------------------------------  ------------------------------------------------------------------- Left ventricle: Inferior and apical hypokinesis The cavity size was mildly dilated. Wall thickness was normal. Systolic function was mildly reduced. The estimated ejection fraction was in the range of 45% to 50%.  ------------------------------------------------------------------- Aortic valve: Mildly calcified leaflets. Doppler: There was no stenosis.  ------------------------------------------------------------------- Aorta: The aorta was normal, not dilated, and non-diseased.  ------------------------------------------------------------------- Mitral valve: Mechanical MVR with mild central MR no perivalvular regurgitation and normal diastolic gradients. Doppler: Peak gradient (D): 5 mm Hg.  ------------------------------------------------------------------- Left atrium: The atrium was mildly  dilated.  ------------------------------------------------------------------- Atrial septum: No defect or patent foramen ovale was identified.  ------------------------------------------------------------------- Right ventricle: The cavity size was normal. Wall thickness was normal. Systolic function was normal.  ------------------------------------------------------------------- Pulmonic valve: Doppler: There was trivial regurgitation.  ------------------------------------------------------------------- Tricuspid valve: Doppler: There was mild regurgitation.  ------------------------------------------------------------------- Right atrium: The atrium was normal in size.  ------------------------------------------------------------------- Pericardium: The pericardium was normal in appearance.  ------------------------------------------------------------------- Post procedure conclusions Ascending Aorta:  - The aorta was normal, not dilated, and non-diseased.  ------------------------------------------------------------------- Measurements  Left ventricle Value Reference LV ID, ED, PLAX chordal 47.9 mm 43 - 52 LV ID, ES, PLAX chordal 32.1 mm 23 - 38 LV fx shortening, PLAX chordal 33 % >=29 LV PW thickness, ED 9.85 mm --------- IVS/LV PW ratio, ED 0.77 <=1.3  Ventricular septum Value Reference IVS thickness, ED 7.58 mm ---------  LVOT Value Reference LVOT ID, S 17 mm --------- LVOT area 2.27 cm^2 ---------  Aorta Value Reference Aortic root ID, ED 31 mm ---------  Left  atrium Value Reference LA ID, A-P, ES 38 mm --------- LA ID/bsa, A-P 1.76 cm/m^2 <=2.2 LA volume, ES, 1-p A4C 84.2 ml --------- LA volume/bsa, ES, 1-p A4C 39 ml/m^2 ---------  Mitral valve Value Reference Mitral E-wave peak velocity 114 cm/s --------- Mitral A-wave peak velocity 135 cm/s --------- Mitral deceleration time (H) 391 ms 150 - 230 Mitral peak gradient, D 5 mm Hg --------- Mitral E/A ratio, peak 0.8 ---------  Pulmonary arteries Value Reference PA pressure, S, DP 25 mm Hg <=30  Tricuspid valve Value Reference Tricuspid regurg peak velocity 206 cm/s --------- Tricuspid peak RV-RA gradient 17 mm Hg ---------  Systemic veins Value Reference Estimated CVP 8 mm Hg ---------  Right ventricle Value Reference RV pressure, S, DP 25 mm Hg <=30 RV s&', lateral, S 8.81 cm/s ---------  Legend: (L) and (H) mark values outside specified reference range.  ------------------------------------------------------------------- Prepared and Electronically Authenticated by  Jenkins Rouge, M.D. 2017-02-23T11:58:36   Impression:  Patient is doing very well approximately 5 months status post minimally invasive mitral valve replacement using a bileaflet mechanical prosthetic valve.  He never underwent diagnostic cardiac catheterization or cardiac gated CT angiogram of the heart in follow-up to his VF arrest and possible inferior wall myocardial infarction.  At this point he  is doing remarkably well.   Plan:  We have not recommended any changes to the patient's current medications at this time. We will touch base with Dr. Haroldine Laws and discuss whether or not follow-up CT angiogram should be performed.  The patient has been reminded regarding the importance of dental hygiene and the lifelong need for antibiotic prophylaxis for all dental cleanings and other related invasive procedures.  I spent in excess of 10 minutes during the conduct of this office consultation and >50% of this time involved direct face-to-face encounter with the patient for counseling and/or coordination of their care.    Valentina Gu. Roxy Manns, MD 04/05/2016 9:59 AM

## 2016-04-05 NOTE — Patient Instructions (Signed)

## 2016-04-30 ENCOUNTER — Ambulatory Visit (INDEPENDENT_AMBULATORY_CARE_PROVIDER_SITE_OTHER): Payer: Managed Care, Other (non HMO) | Admitting: Pharmacist

## 2016-04-30 DIAGNOSIS — Z954 Presence of other heart-valve replacement: Secondary | ICD-10-CM | POA: Diagnosis not present

## 2016-04-30 DIAGNOSIS — Z7901 Long term (current) use of anticoagulants: Secondary | ICD-10-CM | POA: Diagnosis not present

## 2016-04-30 DIAGNOSIS — I4892 Unspecified atrial flutter: Secondary | ICD-10-CM | POA: Diagnosis not present

## 2016-04-30 LAB — POCT INR: INR: 2.1

## 2016-05-10 ENCOUNTER — Other Ambulatory Visit: Payer: Self-pay | Admitting: Pharmacist

## 2016-05-10 MED ORDER — WARFARIN SODIUM 5 MG PO TABS: ORAL_TABLET | ORAL | Status: DC

## 2016-05-20 ENCOUNTER — Encounter (HOSPITAL_COMMUNITY): Payer: Managed Care, Other (non HMO) | Admitting: Internal Medicine

## 2016-05-21 ENCOUNTER — Ambulatory Visit (INDEPENDENT_AMBULATORY_CARE_PROVIDER_SITE_OTHER): Payer: Managed Care, Other (non HMO) | Admitting: Pharmacist Clinician (PhC)/ Clinical Pharmacy Specialist

## 2016-05-21 DIAGNOSIS — I4892 Unspecified atrial flutter: Secondary | ICD-10-CM

## 2016-05-21 DIAGNOSIS — Z954 Presence of other heart-valve replacement: Secondary | ICD-10-CM

## 2016-05-21 DIAGNOSIS — Z7901 Long term (current) use of anticoagulants: Secondary | ICD-10-CM

## 2016-05-21 LAB — POCT INR: INR: 1.7

## 2016-05-26 ENCOUNTER — Encounter (HOSPITAL_COMMUNITY): Payer: Self-pay | Admitting: Internal Medicine

## 2016-05-26 ENCOUNTER — Ambulatory Visit (HOSPITAL_COMMUNITY)
Admission: RE | Admit: 2016-05-26 | Discharge: 2016-05-26 | Disposition: A | Discharge status: Home / Self Care | Payer: Managed Care, Other (non HMO) | Source: Ambulatory Visit | Attending: Internal Medicine | Admitting: Internal Medicine

## 2016-05-26 VITALS — BP 138/80 | HR 65 | Ht 68.0 in | Wt 217.0 lb

## 2016-05-26 DIAGNOSIS — N179 Acute kidney failure, unspecified: Secondary | ICD-10-CM | POA: Diagnosis not present

## 2016-05-26 DIAGNOSIS — I340 Nonrheumatic mitral (valve) insufficiency: Secondary | ICD-10-CM | POA: Diagnosis not present

## 2016-05-26 DIAGNOSIS — R001 Bradycardia, unspecified: Secondary | ICD-10-CM | POA: Insufficient documentation

## 2016-05-26 DIAGNOSIS — Z7901 Long term (current) use of anticoagulants: Secondary | ICD-10-CM | POA: Insufficient documentation

## 2016-05-26 DIAGNOSIS — Z954 Presence of other heart-valve replacement: Secondary | ICD-10-CM | POA: Diagnosis not present

## 2016-05-26 DIAGNOSIS — Z7982 Long term (current) use of aspirin: Secondary | ICD-10-CM | POA: Diagnosis not present

## 2016-05-26 DIAGNOSIS — Z952 Presence of prosthetic heart valve: Secondary | ICD-10-CM | POA: Insufficient documentation

## 2016-05-26 DIAGNOSIS — I890 Lymphedema, not elsewhere classified: Secondary | ICD-10-CM | POA: Insufficient documentation

## 2016-05-26 DIAGNOSIS — I5032 Chronic diastolic (congestive) heart failure: Secondary | ICD-10-CM | POA: Diagnosis not present

## 2016-05-26 DIAGNOSIS — Z8674 Personal history of sudden cardiac arrest: Secondary | ICD-10-CM | POA: Diagnosis not present

## 2016-05-26 DIAGNOSIS — G4733 Obstructive sleep apnea (adult) (pediatric): Secondary | ICD-10-CM | POA: Diagnosis not present

## 2016-05-26 DIAGNOSIS — I110 Hypertensive heart disease with heart failure: Secondary | ICD-10-CM | POA: Diagnosis not present

## 2016-05-26 DIAGNOSIS — R911 Solitary pulmonary nodule: Secondary | ICD-10-CM | POA: Diagnosis not present

## 2016-05-26 DIAGNOSIS — Z86718 Personal history of other venous thrombosis and embolism: Secondary | ICD-10-CM | POA: Insufficient documentation

## 2016-05-26 DIAGNOSIS — Z87891 Personal history of nicotine dependence: Secondary | ICD-10-CM | POA: Insufficient documentation

## 2016-05-26 DIAGNOSIS — Z91041 Radiographic dye allergy status: Secondary | ICD-10-CM | POA: Diagnosis not present

## 2016-05-26 LAB — BASIC METABOLIC PANEL
ANION GAP: 4 — AB (ref 5–15)
BUN: 12 mg/dL (ref 6–20)
CALCIUM: 8.9 mg/dL (ref 8.9–10.3)
CO2: 24 mmol/L (ref 22–32)
CREATININE: 0.84 mg/dL (ref 0.61–1.24)
Chloride: 110 mmol/L (ref 101–111)
Glucose, Bld: 97 mg/dL (ref 65–99)
Potassium: 4.3 mmol/L (ref 3.5–5.1)
SODIUM: 138 mmol/L (ref 135–145)

## 2016-05-26 MED ORDER — DIPHENHYDRAMINE HCL 50 MG PO TABS: ORAL_TABLET | ORAL | Status: DC

## 2016-05-26 MED ORDER — PREDNISONE 50 MG PO TABS: ORAL_TABLET | ORAL | Status: DC

## 2016-05-26 NOTE — Progress Notes (Signed)
Patient ID: CARMELLO TWING, male   DOB: 09-02-1966, 50 y.o.   MRN: [id number]  [facility]    HPI:  Mr. Dechene is a 50 y/o male with OSA and mitral regurgitation. 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 On POD #3  he had a sudden VF arrest with nearly an hour of resuscitation time. Follow-up transthoracic and transesophageal echocardiograms performed after the arrest revealed normal function of the mechanical prosthetic valve with severe dysfunction involving the inferior wall of the left ventricle EF35-40%. Cardiac enzymes went quite high and he had some bradycardia and AV block, all suggestive of possible inferior wall myocardial infarction. However, left ventricular function completely normalized prior to hospital discharge. Plans for diagnostic cardiac catheterization were postponed because the patient developed acute non-oliguric renal failure with creatinine up to 7.Because the patient's rhythm and left ventricular systolic function recovered completely, it was felt that the patient did not need defibrillator implantation. The patient was ultimately discharged from the hospital on the 18th postoperative day.    Doing very well. Walking every day. Mowing the grass. Working at [employer].  Taking coumadin without bleeding. No edema, orthopnea or PND.  Takes lasix 2x/week. Monday and Friday. No dizziness. No palpitations. Went for CT scan of his coronaries but turned away because he didn't have prep. Using CPAP. SBP 128-135. Takes lasix on Monday and Friday.   Echo 2/17 EF 45-50% inferior HK. MV ok    ROS: All systems negative except as listed in HPI, PMH and Problem List.  SH:  Social History   Social History  . Marital Status: Legally Separated    Spouse Name: N/A  . Number of Children: 3  . Years of Education: N/A     Occupational History  . Not on file.   Social History Main Topics  . Smoking status: Former Smoker -- 0.10 packs/day for 9 years    Types: Cigarettes    Quit date: 09/05/2012  . Smokeless tobacco: Never Used  . Alcohol Use: No  . Drug Use: No  . Sexual Activity: Yes   Other Topics Concern  . Not on file   Social History Narrative    FH: No family history on file.  Past Medical History  Diagnosis Date  . 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  Complicated by chronic left lower extremity edema and occasional recurrent left lower extremity cellulitis   . Obstructive sleep apnea 12/14/2006    AHI 19.2/hr, desaturation to 75% on Polysomnography 02/26/2005.  Uses 11 cm H2O nocturnal nasal CPAP with good symptom control.   . Mitral regurgitation 10/06/2012  . Lymphedema early 65's  . DVT (deep venous thrombosis) ([place]) early 80's    left leg  . Incidental lung nodule, less than or equal to 34mm 11/10/2015    3 mm nodule right lung  . Acute diastolic congestive heart failure ([place]) 09/21/2015    takes Lasix daily  . 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 Acute renal failure ([facility])     ATN s/p cardiac arrest    Current Outpatient Prescriptions  Medication Sig Dispense Refill  . amoxicillin (AMOXIL) 500 MG capsule     . aspirin (ASPIR-81) 81 MG EC tablet Take 1 tablet (81  mg total) by mouth daily. Swallow whole. 30 tablet 12  . furosemide (LASIX) 40 MG tablet Take 1 tablet (40 mg total) by mouth 2 (two) times a week. 15 tablet 3  . warfarin (COUMADIN) 5 MG tablet Take 1 to 1.5 tablets by mouth daily as directed by coumadin clinic 45 tablet 1   No current facility-administered medications for this encounter.    Filed Vitals:   05/26/16 0948  BP: 138/80  Pulse: 65  Height: 5\' 8"  (1.727 m)   Weight: 217 lb (98.431 kg)  SpO2: 99%    PHYSICAL EXAM:  General:  Well appearing. No resp difficulty HEENT: normal Neck: supple. JVP flat. Carotids 2+ bilaterally; no bruits. No lymphadenopathy or thryomegaly appreciated. Cor: PMI normal. Regular rate & rhythm. Mechanical s1 No rubs, gallops or murmurs.  Lungs: clear Abdomen: soft, nontender, nondistended. No hepatosplenomegaly. No bruits or masses. Good bowel sounds. Extremities: no cyanosis, clubbing, rash, edema. Lymphedema left leg Neuro: alert & orientedx3, cranial nerves grossly intact. Moves all 4 extremities w/o difficulty. Affect pleasant.   ASSESSMENT & PLAN: 1. Mitral regurgitation s/p mechanical MVR    --normal coronaries pre-op 2. Prolonged in-hospital post-cardiac arrest - VT/VF    --due to acute RCA infarct 3. Acute renal failure due to #2 - resolved 4. Lymphedema in left leg 5. HTN 6. Contrast allergy  Doing great. Volume status looks good. I considered switch lasix out for spiro 12.5 daily but potassium has run high historically. Will check BMET today and reassess.  Also considered b-blocker but HR on low end. Will continue current medicines. Echo images confirm suspicion of previous RCA infarct leading to VF arrest in hospital. Will get coronary CT scan to further evaluate coronary anatomy with prep for contrast allergy. F/u 6 months.   Continue warfarin.   Bensimhon, Daniel,MD 10:33 AM

## 2016-05-26 NOTE — Patient Instructions (Signed)
Will schedule you for CT angio chest at [facility].  __________________________________________________  __________________________________________________  PREP FOR CT ANGIO Take 50 mg (1 tab) of prednisone 13 hrs before CT Take 50 mg (1 tab) prednisone 7 hrs before CT. Take 50 mg (1 tab) prednisone and 50 mg (1 tab) diphenhydramine 1 hr before CT.  Follow up 6 months with Dr. Haroldine Laws.  Do the following things EVERYDAY: 1) Weigh yourself in the morning before breakfast. Write it down and keep it in a log. 2) Take your medicines as prescribed 3) Eat low salt foods-Limit salt (sodium) to 2000 mg per day.  4) Stay as active as you can everyday 5) Limit all fluids for the day to less than 2 liters

## 2016-05-26 NOTE — Addendum Note (Signed)
Encounter addended by: Effie Berkshire, RN on: 05/26/2016 11:05 AM<BR>     Documentation filed: [employer], Patient Instructions Section, Orders

## 2016-06-02 ENCOUNTER — Telehealth (HOSPITAL_COMMUNITY): Payer: Self-pay | Admitting: *Deleted

## 2016-06-02 NOTE — Telephone Encounter (Signed)
Pre cert started on [email or web address] additional information faxed to [phone]. Case is still pending

## 2016-06-03 ENCOUNTER — Ambulatory Visit (HOSPITAL_COMMUNITY): Payer: Managed Care, Other (non HMO)

## 2016-06-04 ENCOUNTER — Ambulatory Visit (INDEPENDENT_AMBULATORY_CARE_PROVIDER_SITE_OTHER): Payer: Managed Care, Other (non HMO) | Admitting: Pharmacist Clinician (PhC)/ Clinical Pharmacy Specialist

## 2016-06-04 DIAGNOSIS — I4892 Unspecified atrial flutter: Secondary | ICD-10-CM

## 2016-06-04 DIAGNOSIS — Z954 Presence of other heart-valve replacement: Secondary | ICD-10-CM | POA: Diagnosis not present

## 2016-06-04 DIAGNOSIS — Z7901 Long term (current) use of anticoagulants: Secondary | ICD-10-CM | POA: Diagnosis not present

## 2016-06-04 LAB — POCT INR: INR: 3.3

## 2016-06-08 ENCOUNTER — Other Ambulatory Visit (HOSPITAL_COMMUNITY): Payer: Self-pay | Admitting: Cardiology

## 2016-06-08 DIAGNOSIS — I5032 Chronic diastolic (congestive) heart failure: Secondary | ICD-10-CM

## 2016-06-09 ENCOUNTER — Ambulatory Visit (HOSPITAL_COMMUNITY): Admission: RE | Admit: 2016-06-09 | Payer: Managed Care, Other (non HMO) | Source: Ambulatory Visit

## 2016-06-22 ENCOUNTER — Ambulatory Visit (INDEPENDENT_AMBULATORY_CARE_PROVIDER_SITE_OTHER): Payer: Managed Care, Other (non HMO) | Admitting: Pharmacist

## 2016-06-22 DIAGNOSIS — Z7901 Long term (current) use of anticoagulants: Secondary | ICD-10-CM | POA: Diagnosis not present

## 2016-06-22 DIAGNOSIS — I4892 Unspecified atrial flutter: Secondary | ICD-10-CM

## 2016-06-22 DIAGNOSIS — Z954 Presence of other heart-valve replacement: Secondary | ICD-10-CM | POA: Diagnosis not present

## 2016-06-22 LAB — POCT INR: INR: 1.9

## 2016-07-06 ENCOUNTER — Ambulatory Visit (INDEPENDENT_AMBULATORY_CARE_PROVIDER_SITE_OTHER): Payer: Managed Care, Other (non HMO) | Admitting: Pharmacist

## 2016-07-06 ENCOUNTER — Encounter (INDEPENDENT_AMBULATORY_CARE_PROVIDER_SITE_OTHER): Payer: Self-pay

## 2016-07-06 DIAGNOSIS — Z954 Presence of other heart-valve replacement: Secondary | ICD-10-CM

## 2016-07-06 DIAGNOSIS — Z7901 Long term (current) use of anticoagulants: Secondary | ICD-10-CM

## 2016-07-06 DIAGNOSIS — I4892 Unspecified atrial flutter: Secondary | ICD-10-CM

## 2016-07-06 LAB — POCT INR: INR: 2.6

## 2016-07-27 ENCOUNTER — Ambulatory Visit (INDEPENDENT_AMBULATORY_CARE_PROVIDER_SITE_OTHER): Payer: Managed Care, Other (non HMO) | Admitting: Pharmacist

## 2016-07-27 DIAGNOSIS — I4892 Unspecified atrial flutter: Secondary | ICD-10-CM | POA: Diagnosis not present

## 2016-07-27 DIAGNOSIS — Z7901 Long term (current) use of anticoagulants: Secondary | ICD-10-CM

## 2016-07-27 DIAGNOSIS — Z954 Presence of other heart-valve replacement: Secondary | ICD-10-CM | POA: Diagnosis not present

## 2016-07-27 LAB — POCT INR: INR: 2.4

## 2016-07-29 ENCOUNTER — Other Ambulatory Visit (HOSPITAL_COMMUNITY): Payer: Self-pay | Admitting: Internal Medicine

## 2016-08-17 ENCOUNTER — Ambulatory Visit (INDEPENDENT_AMBULATORY_CARE_PROVIDER_SITE_OTHER): Payer: Managed Care, Other (non HMO) | Admitting: Pharmacist

## 2016-08-17 DIAGNOSIS — Z954 Presence of other heart-valve replacement: Secondary | ICD-10-CM | POA: Diagnosis not present

## 2016-08-17 DIAGNOSIS — Z7901 Long term (current) use of anticoagulants: Secondary | ICD-10-CM | POA: Diagnosis not present

## 2016-08-17 DIAGNOSIS — I4892 Unspecified atrial flutter: Secondary | ICD-10-CM

## 2016-08-17 LAB — POCT INR: INR: 1.7

## 2016-09-01 ENCOUNTER — Ambulatory Visit (INDEPENDENT_AMBULATORY_CARE_PROVIDER_SITE_OTHER): Payer: Managed Care, Other (non HMO) | Admitting: Pharmacist

## 2016-09-01 DIAGNOSIS — Z954 Presence of other heart-valve replacement: Secondary | ICD-10-CM

## 2016-09-01 DIAGNOSIS — I4892 Unspecified atrial flutter: Secondary | ICD-10-CM

## 2016-09-01 DIAGNOSIS — Z7901 Long term (current) use of anticoagulants: Secondary | ICD-10-CM | POA: Diagnosis not present

## 2016-09-01 LAB — POCT INR: INR: 2

## 2016-09-01 MED ORDER — WARFARIN SODIUM 5 MG PO TABS: ORAL_TABLET | ORAL | 1 refills | Status: DC

## 2016-09-17 ENCOUNTER — Ambulatory Visit (INDEPENDENT_AMBULATORY_CARE_PROVIDER_SITE_OTHER): Payer: Managed Care, Other (non HMO) | Admitting: Pharmacist Clinician (PhC)/ Clinical Pharmacy Specialist

## 2016-09-17 DIAGNOSIS — Z954 Presence of other heart-valve replacement: Secondary | ICD-10-CM

## 2016-09-17 DIAGNOSIS — Z7901 Long term (current) use of anticoagulants: Secondary | ICD-10-CM | POA: Diagnosis not present

## 2016-09-17 DIAGNOSIS — I4892 Unspecified atrial flutter: Secondary | ICD-10-CM

## 2016-09-17 LAB — POCT INR: INR: 2.3

## 2016-10-01 ENCOUNTER — Ambulatory Visit (INDEPENDENT_AMBULATORY_CARE_PROVIDER_SITE_OTHER): Payer: Managed Care, Other (non HMO) | Admitting: Pharmacist

## 2016-10-01 DIAGNOSIS — Z7901 Long term (current) use of anticoagulants: Secondary | ICD-10-CM | POA: Diagnosis not present

## 2016-10-01 DIAGNOSIS — Z954 Presence of other heart-valve replacement: Secondary | ICD-10-CM | POA: Diagnosis not present

## 2016-10-01 DIAGNOSIS — I4892 Unspecified atrial flutter: Secondary | ICD-10-CM

## 2016-10-01 LAB — POCT INR: INR: 2.9

## 2016-10-25 ENCOUNTER — Ambulatory Visit (INDEPENDENT_AMBULATORY_CARE_PROVIDER_SITE_OTHER): Payer: Managed Care, Other (non HMO) | Admitting: Pharmacist Clinician (PhC)/ Clinical Pharmacy Specialist

## 2016-10-25 ENCOUNTER — Other Ambulatory Visit: Payer: Self-pay | Admitting: Pharmacist Clinician (PhC)/ Clinical Pharmacy Specialist

## 2016-10-25 DIAGNOSIS — I4892 Unspecified atrial flutter: Secondary | ICD-10-CM

## 2016-10-25 DIAGNOSIS — Z954 Presence of other heart-valve replacement: Secondary | ICD-10-CM

## 2016-10-25 DIAGNOSIS — Z7901 Long term (current) use of anticoagulants: Secondary | ICD-10-CM

## 2016-10-25 LAB — POCT INR: INR: 2.8

## 2016-10-25 MED ORDER — WARFARIN SODIUM 5 MG PO TABS: ORAL_TABLET | ORAL | 1 refills | Status: DC

## 2016-11-15 ENCOUNTER — Encounter: Payer: Self-pay | Admitting: Thoracic Surgery (Cardiothoracic Vascular Surgery)

## 2016-11-15 ENCOUNTER — Ambulatory Visit (INDEPENDENT_AMBULATORY_CARE_PROVIDER_SITE_OTHER): Payer: Managed Care, Other (non HMO) | Admitting: Thoracic Surgery (Cardiothoracic Vascular Surgery)

## 2016-11-15 VITALS — BP 114/82 | HR 64 | Resp 20 | Ht 68.0 in | Wt 215.0 lb

## 2016-11-15 DIAGNOSIS — Z954 Presence of other heart-valve replacement: Secondary | ICD-10-CM | POA: Diagnosis not present

## 2016-11-15 NOTE — Progress Notes (Signed)
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 The patient's early postoperative recovery was notable for a sudden VF arrest on the third postoperative day from which he was successfully resuscitated.  At the time the patient's temporary pacemaker was connected and there was suggestion that he may have suffered an R on T spike causing his arrest, but it took nearly 30 minutes for him to reestablish a stable rhythm despite the fact that his arrest was witnessed and he was aggressively resuscitated and cardioverted on numerous occasions. Follow-up transthoracic and transesophageal echocardiograms performed after the arrest revealed normal function of the mechanical prosthetic valve with severe dysfunction involving the inferior wall of the left ventricle.   he recovered remarkably well and most recent follow-up echocardiogram performed 01/22/2016 revealed normal functioning mechanical valve with ejection fraction estimated 45-50%.  He was last seen here in our office on 04/05/2016 at which time he was doing well. Since then he has been seen in follow-up by Dr. Haroldine Brown on 05/26/2016. Initially plans were made for the patient undergo cardiac gated CT angiogram of the heart to evaluate his coronary arteries, but when the patient went for the scan he had not been given a premedication and the scan was canceled because of his history of IV contrast allergy. The scan was never rescheduled. He returns to our office today for routine follow-up. He states that he  is doing exceptionally well.  He is working full-time at a local [employer]. He reports no symptoms of exertional shortness of breath or chest discomfort whatsoever. He states that he occasionally gets dizzy spells in the morning, but he admits that he never eats breakfast. His dizzy spells usually go away if he just eats something. He is fairly active physically and he reports no limitations. He remains anticoagulated on Coumadin for his mechanical valve, and he has not had any problems related to chronic anticoagulation. Overall he has no complaints.   Current Outpatient Prescriptions  Medication Sig Dispense Refill  . aspirin (ASPIR-81) 81 MG EC tablet Take 1 tablet (81 mg total) by mouth daily. Swallow whole. 30 tablet 12  . diphenhydrAMINE (BENADRYL) 50 MG tablet Take 1 tab with prednisone 1 hr before CT 1 tablet 0  . furosemide (LASIX) 40 MG tablet TAKE 1 TABLET BY MOUTH TWICE A WEEK 15 tablet 2  . predniSONE (DELTASONE) 50 MG tablet Take 1 tab 13 hrs before CT. Take 1 tab 7 hrs before CT. Take 1 tab 1 hr before CT. 3 tablet 0  . warfarin (COUMADIN) 5 MG tablet Take 1.5 to 2 tablets by mouth daily as directed by coumadin clinic 180 tablet 1  . amoxicillin (AMOXIL) 500 MG capsule      No current facility-administered medications for this visit.       Physical Exam:   BP 114/82   Pulse 64   Resp 20   Ht 5\' 8"  (1.727 m)   Wt 215 lb (97.5 kg)   SpO2 98% Comment:  RA  BMI 32.69 kg/m   General:  Well-appearing  Chest:   Clear to auscultation  CV:   Regular rate and rhythm with mechanical heart valve sounds, no murmur appreciated  Incisions:  Completely healed  Abdomen:  Soft nontender  Extremities:  Warm and well-perfused  Diagnostic Tests:  n/a   Impression:  Patient is doing remarkably well approximately one year status post minimally invasive mitral valve replacement using a bileaflet mechanical prosthetic valve for rheumatic heart disease with severe  symptomatically mitral regurgitation.  Plan:  We have not recommended any changes to the patient's current medications. The patient has a follow-up appointment scheduled in the near future with Dr. Haroldine Brown. He will discuss at that time whether or not he should have the CT angiogram performed.  The patient has been reminded regarding the importance of dental hygiene and the lifelong need for antibiotic prophylaxis for all dental cleanings and other related invasive procedures.  In the future he will call and return to see [place] only should specific problems or questions arise.  I spent in excess of 15 minutes during the conduct of this office consultation and >50% of this time involved direct face-to-face encounter with the patient for counseling and/or coordination of their care.   Oscar Gu. Roxy Manns, MD 11/15/2016 11:09 AM

## 2016-11-15 NOTE — Patient Instructions (Signed)

## 2016-11-24 ENCOUNTER — Ambulatory Visit (INDEPENDENT_AMBULATORY_CARE_PROVIDER_SITE_OTHER): Payer: Managed Care, Other (non HMO) | Admitting: Pharmacist

## 2016-11-24 DIAGNOSIS — Z7901 Long term (current) use of anticoagulants: Secondary | ICD-10-CM | POA: Diagnosis not present

## 2016-11-24 DIAGNOSIS — I4892 Unspecified atrial flutter: Secondary | ICD-10-CM | POA: Diagnosis not present

## 2016-11-24 DIAGNOSIS — Z954 Presence of other heart-valve replacement: Secondary | ICD-10-CM | POA: Diagnosis not present

## 2016-11-24 LAB — POCT INR: INR: 2.7

## 2016-12-21 ENCOUNTER — Ambulatory Visit (INDEPENDENT_AMBULATORY_CARE_PROVIDER_SITE_OTHER): Payer: Managed Care, Other (non HMO) | Admitting: Pharmacist

## 2016-12-21 DIAGNOSIS — I4892 Unspecified atrial flutter: Secondary | ICD-10-CM | POA: Diagnosis not present

## 2016-12-21 DIAGNOSIS — Z7901 Long term (current) use of anticoagulants: Secondary | ICD-10-CM | POA: Diagnosis not present

## 2016-12-21 DIAGNOSIS — Z954 Presence of other heart-valve replacement: Secondary | ICD-10-CM | POA: Diagnosis not present

## 2016-12-21 LAB — POCT INR: INR: 2.7

## 2016-12-23 ENCOUNTER — Encounter: Payer: Self-pay | Admitting: Internal Medicine

## 2016-12-23 DIAGNOSIS — I2510 Atherosclerotic heart disease of native coronary artery without angina pectoris: Secondary | ICD-10-CM | POA: Insufficient documentation

## 2016-12-24 ENCOUNTER — Encounter: Payer: Self-pay | Admitting: Internal Medicine

## 2016-12-31 IMAGING — CR DG CHEST 2V
2 series · 2 of 2 positions shown · non-contrast
Comparison: September 21, 2015.

CLINICAL DATA: Hypertension.

EXAM:
CHEST  2 VIEW

[w chest pa]
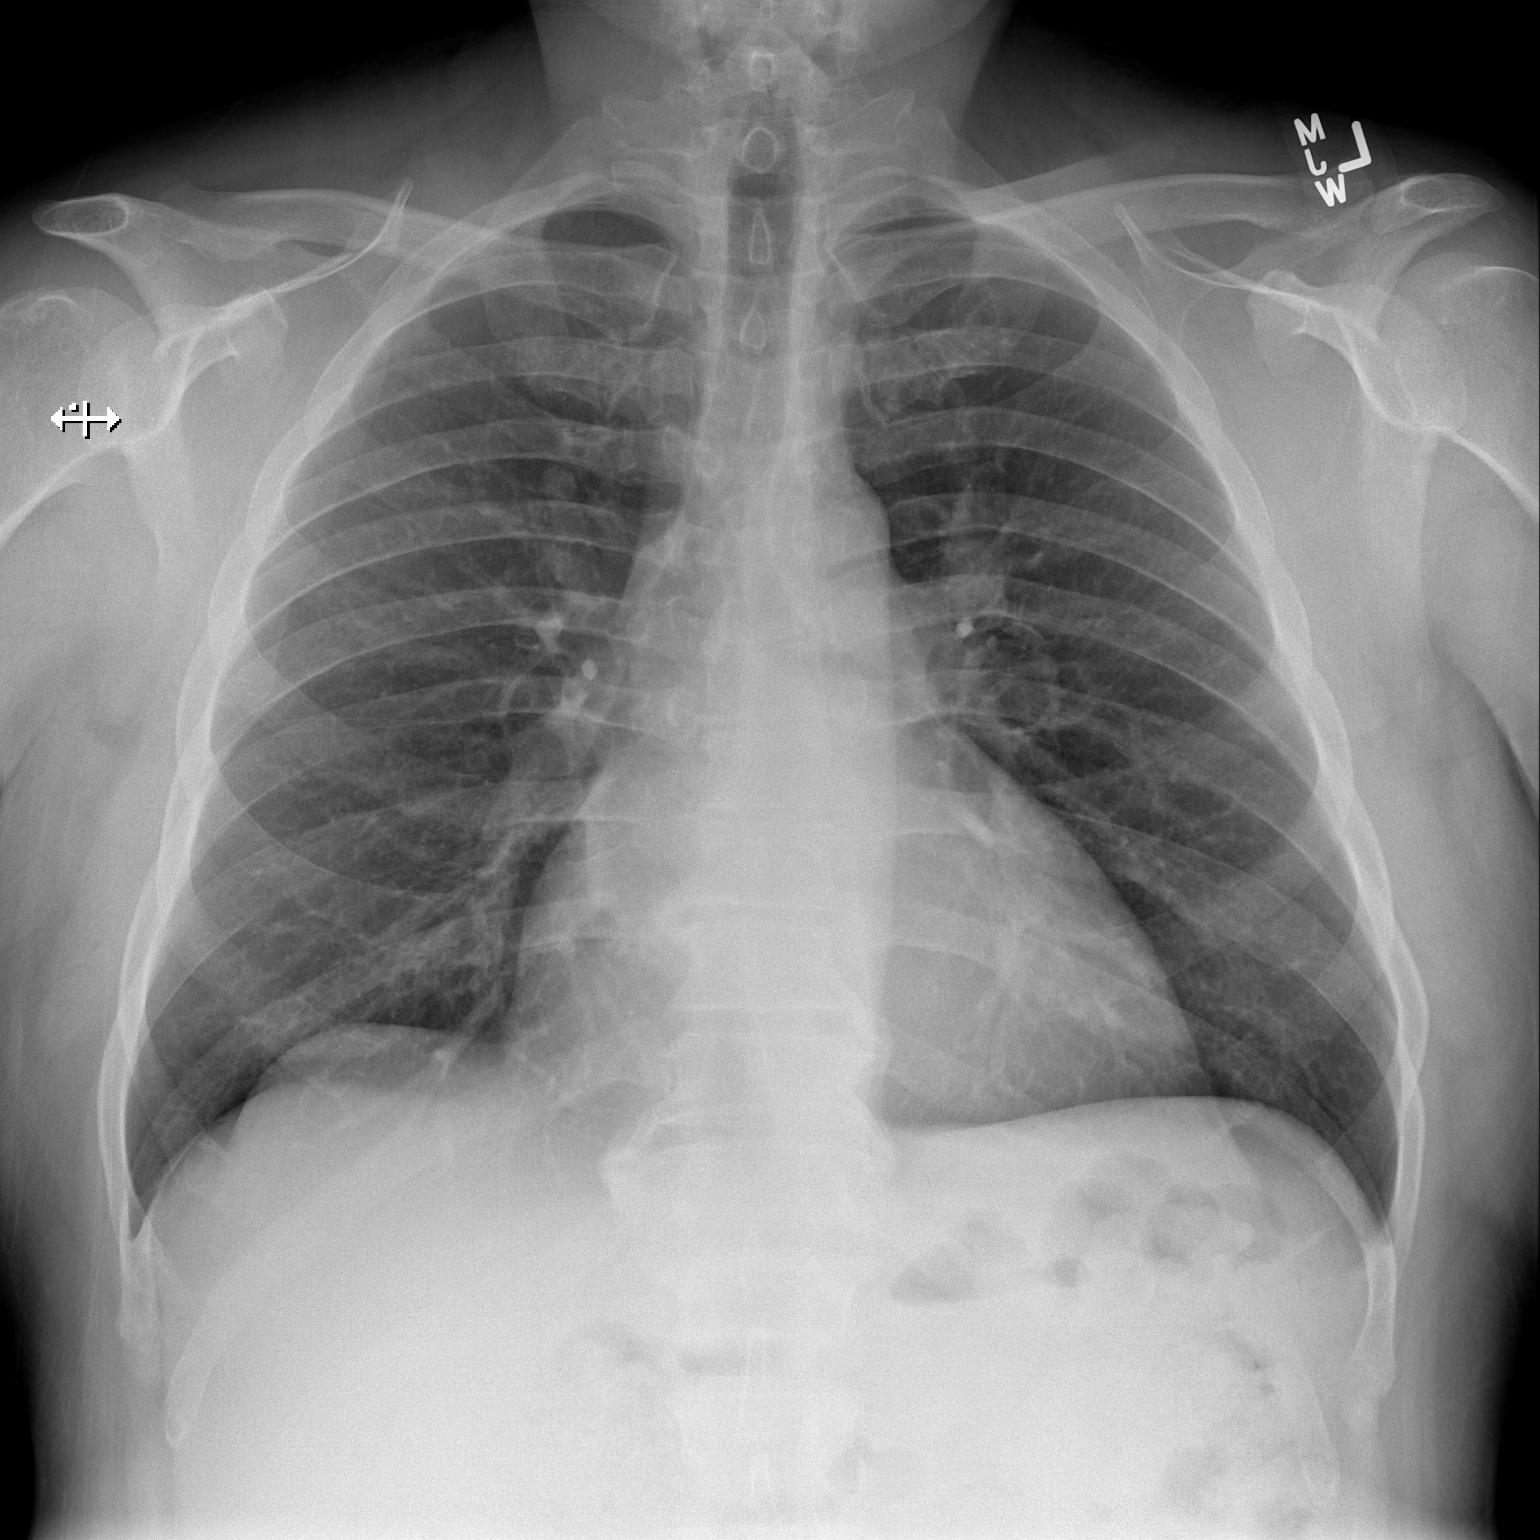

[w chest lat]
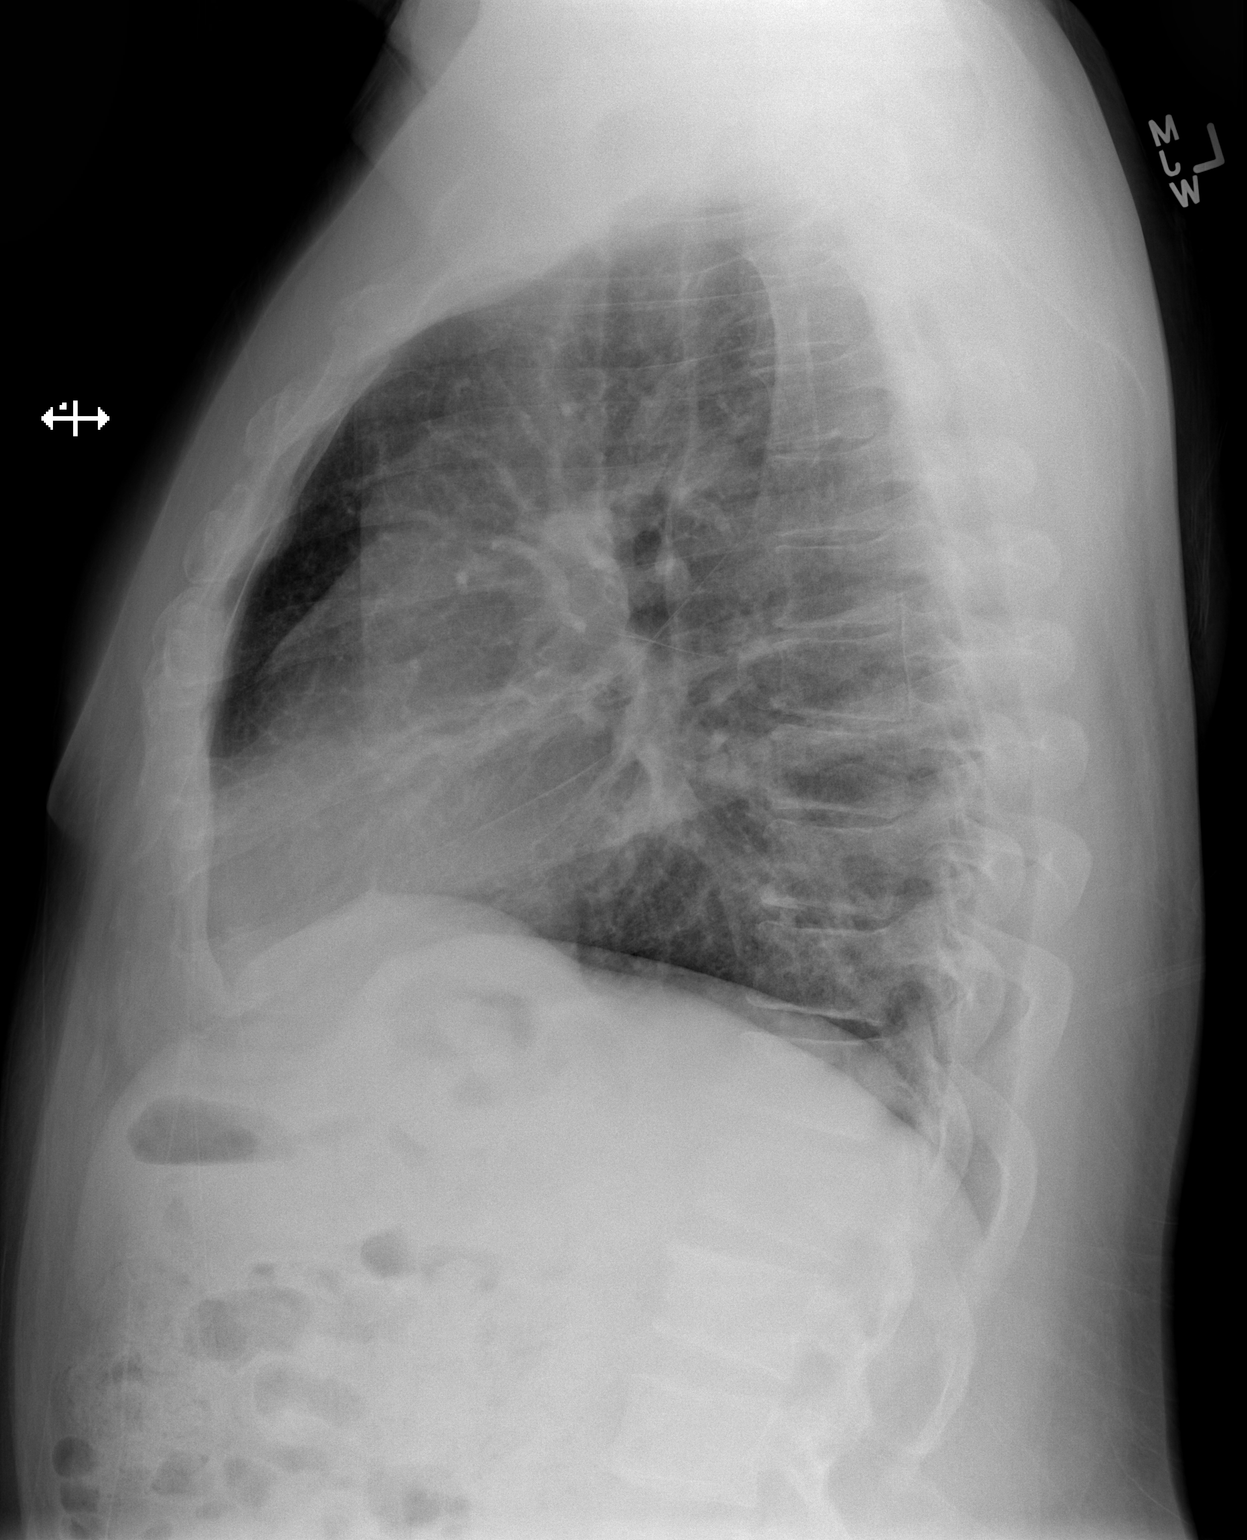

[2 of 2 positions shown; findings below may reference images not displayed]

FINDINGS: The heart size and mediastinal contours are within normal limits.
Both lungs are clear. No pneumothorax or pleural effusion is noted.
The visualized skeletal structures are unremarkable.
IMPRESSION: No active cardiopulmonary disease.

## 2017-01-03 IMAGING — DX DG CHEST 1V PORT
1 series · 1 of 1 positions shown · non-contrast
Comparison: Portable chest x-ray November 13, 2015.

CLINICAL DATA: CHF, mitral regurgitation status post mitral valve
replacement

EXAM:
PORTABLE CHEST 1 VIEW

[chest ap]
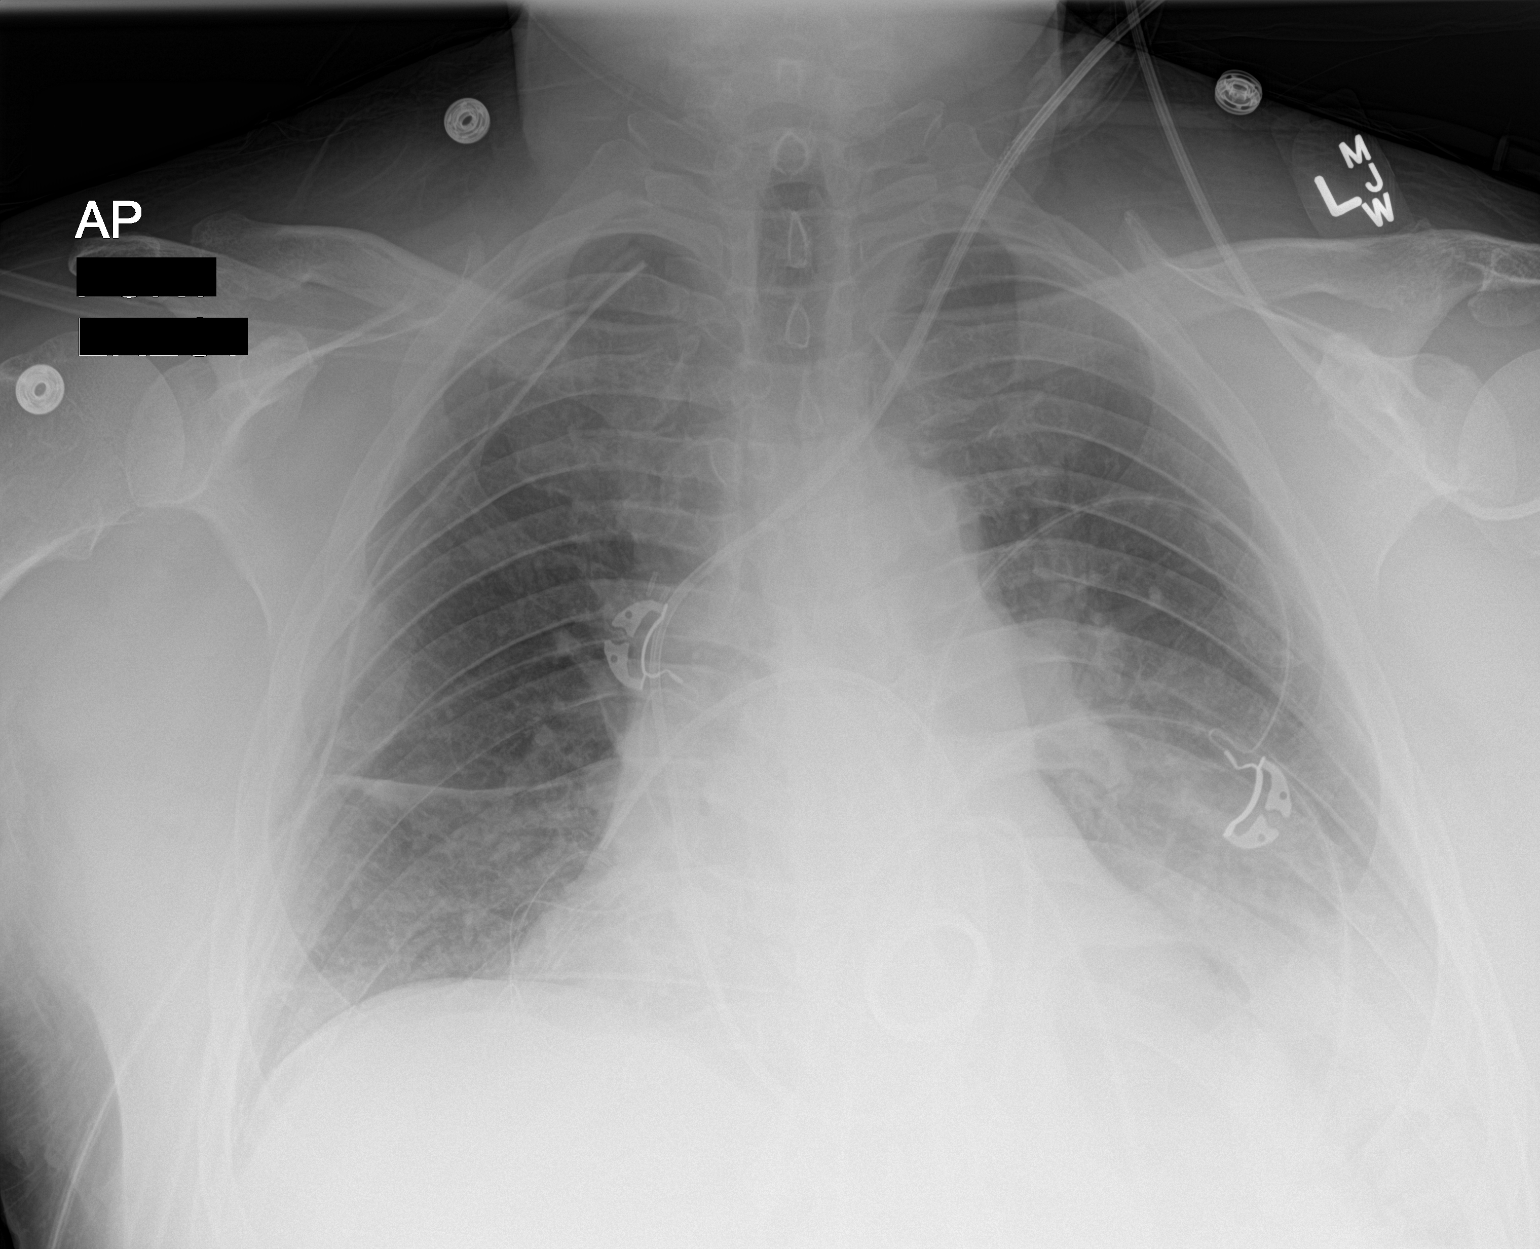

[1 of 1 positions shown; findings below may reference images not displayed]

FINDINGS: The lungs are adequately inflated. There is improvement in
atelectasis on the right. There is persistent left basilar
atelectasis. There is no pneumothorax. There small left pleural
effusion. The cardiac silhouette remains enlarged. The Swan-Ganz
catheter tip projects in a right descending pulmonary artery and may
be wedged. The prosthetic mitral valve ring is visible. The upper
right-sided chest tube tip projects between the right second and
third rib interspaces. The lower chest tube tip projects at the
level of the medial cardiophrenic angle on the right.
IMPRESSION: Interval extubation of the trachea and of the esophagus. Improved
right basilar atelectasis, persistent left basilar atelectasis and
slight increase in pleural fluid on the left. There is no
pneumothorax. There is persistent enlargement of the cardiac
silhouette. Slightly increased pulmonary vascular congestion.

Correlation as to the positioning of the Swan-Ganz catheter tip is
needed. It may be wedged in a right lower lobe pulmonary artery.

## 2017-01-04 IMAGING — CR DG CHEST 1V PORT
1 series · 1 of 1 positions shown · non-contrast
Comparison: 11/14/2015

CLINICAL DATA: Status post mitral valve replacement

EXAM:
PORTABLE CHEST - 1 VIEW

[AP]
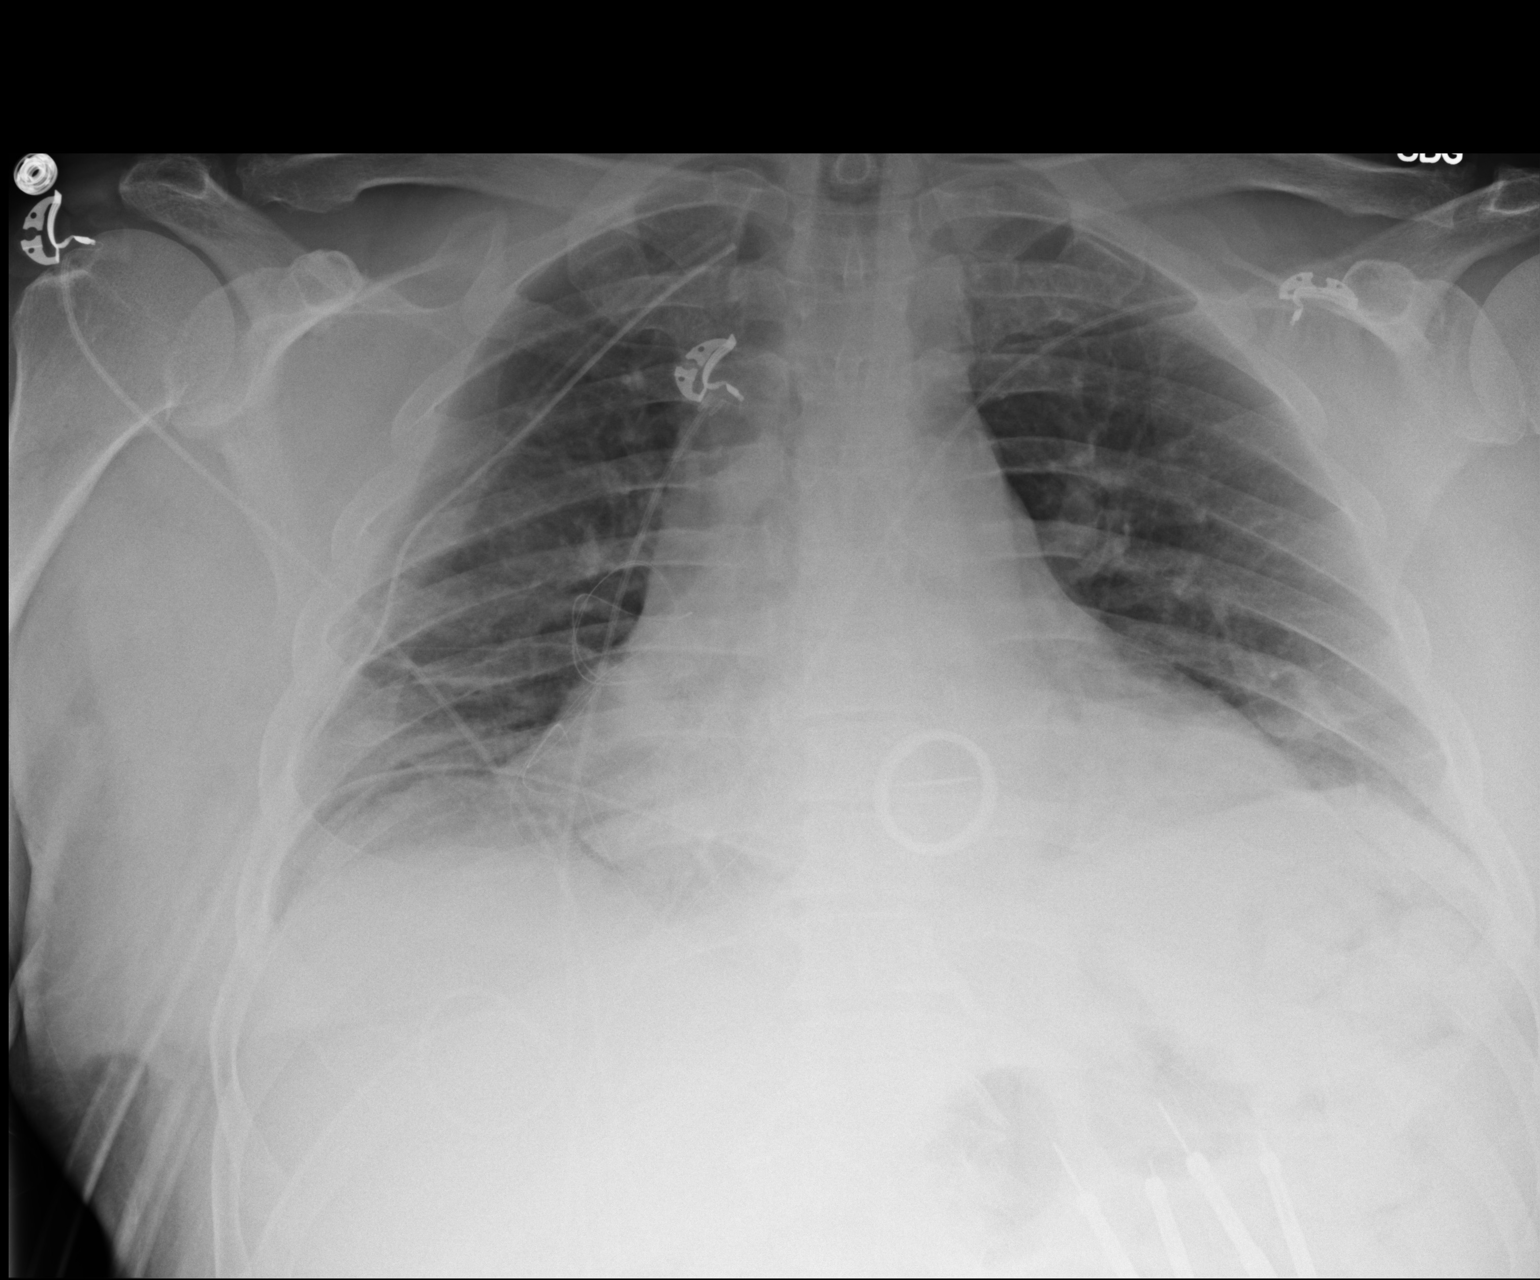

[1 of 1 positions shown; findings below may reference images not displayed]

FINDINGS: Cardiac shadow remains enlarged. Mitral valve replacement is seen.
The Swan-Ganz catheter noted previously has been removed. Two right
thoracostomy catheters are noted. No pneumothorax is seen. Mild
bibasilar atelectatic changes noted.
IMPRESSION: Postsurgical change. No pneumothorax is noted. Bibasilar atelectatic
changes are seen.

## 2017-01-05 IMAGING — CR DG CHEST 1V PORT
1 series · 1 of 1 positions shown · non-contrast
Comparison: Chest x-ray dated 11/15/2015.

CLINICAL DATA: Endotracheal tube placement.

EXAM:
PORTABLE CHEST 1 VIEW

[AP]
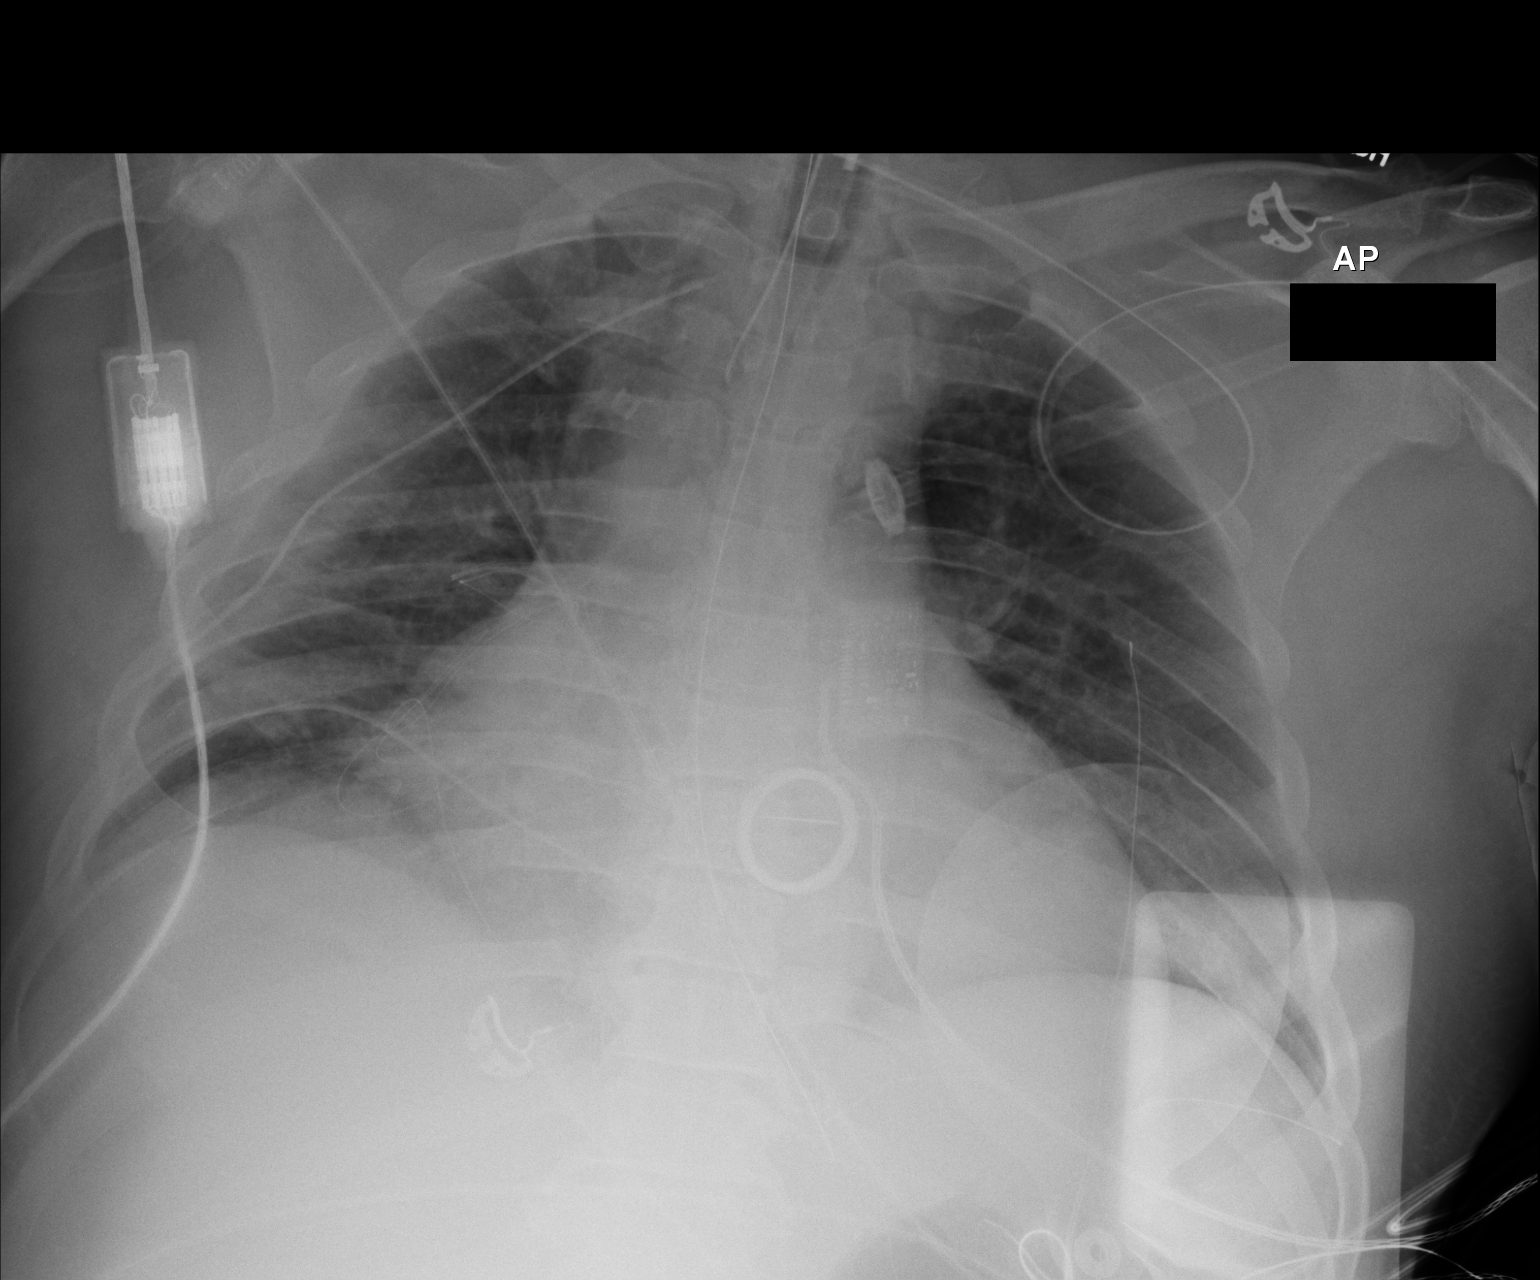

[1 of 1 positions shown; findings below may reference images not displayed]

FINDINGS: Endotracheal tube has been placed in the interval and appears well
positioned with tip approximately 2.5 cm above the level of the
carina. Enteric tube passes below the diaphragm but with side holes
just above the level of the gastroesophageal junction.

Cardiomegaly is stable. Overall cardiomediastinal silhouette is
unchanged in size or configuration. Two right-sided chest tubes
appear stable in position. Lungs are clear.
IMPRESSION: 1. Nasogastric tube tip is just below the level of the diaphragm,
with proximal side holes just above the level of the
gastroesophageal junction. Recommend advancement.
2. Endotracheal tube well positioned with tip just above the level
of the carina.
3. Stable position of the right-sided chest tubes.
4. Stable cardiomegaly.
5. Lungs are clear, perhaps mild atelectasis at the right lung base.

## 2017-02-03 ENCOUNTER — Ambulatory Visit (INDEPENDENT_AMBULATORY_CARE_PROVIDER_SITE_OTHER): Payer: Managed Care, Other (non HMO) | Admitting: Pharmacist Clinician (PhC)/ Clinical Pharmacy Specialist

## 2017-02-03 DIAGNOSIS — Z954 Presence of other heart-valve replacement: Secondary | ICD-10-CM | POA: Diagnosis not present

## 2017-02-03 DIAGNOSIS — Z7901 Long term (current) use of anticoagulants: Secondary | ICD-10-CM

## 2017-02-03 LAB — POCT INR: INR: 5.6

## 2017-02-07 ENCOUNTER — Other Ambulatory Visit (HOSPITAL_COMMUNITY): Payer: Self-pay | Admitting: Internal Medicine

## 2017-02-18 ENCOUNTER — Telehealth: Payer: Self-pay | Admitting: *Deleted

## 2017-02-18 ENCOUNTER — Ambulatory Visit (INDEPENDENT_AMBULATORY_CARE_PROVIDER_SITE_OTHER): Payer: Managed Care, Other (non HMO) | Admitting: Pharmacist

## 2017-02-18 DIAGNOSIS — Z7901 Long term (current) use of anticoagulants: Secondary | ICD-10-CM | POA: Diagnosis not present

## 2017-02-18 DIAGNOSIS — Z954 Presence of other heart-valve replacement: Secondary | ICD-10-CM

## 2017-02-18 LAB — PROTIME-INR
INR: 5.8 — AB
PROTHROMBIN TIME: 57.4 s — AB (ref 9.0–11.5)

## 2017-02-18 LAB — POCT INR: INR: 6.6

## 2017-02-18 NOTE — Telephone Encounter (Signed)
Jonelle Sidle calling from [facility] w stat INR result -  5.8 repeated and verified. Sent to pharmD to review (lab results visible in EPIC)

## 2017-02-18 NOTE — Telephone Encounter (Signed)
Thank you for the update.   See coumadin note for complete plan.  Patient aware of resukts and to hold warfarin for 2 days. Next INR on Monday.

## 2017-02-21 ENCOUNTER — Ambulatory Visit (INDEPENDENT_AMBULATORY_CARE_PROVIDER_SITE_OTHER): Payer: Managed Care, Other (non HMO) | Admitting: Pharmacist

## 2017-02-21 DIAGNOSIS — Z7901 Long term (current) use of anticoagulants: Secondary | ICD-10-CM

## 2017-02-21 DIAGNOSIS — Z954 Presence of other heart-valve replacement: Secondary | ICD-10-CM | POA: Diagnosis not present

## 2017-02-21 LAB — POCT INR: INR: 2.2

## 2017-03-02 ENCOUNTER — Ambulatory Visit (INDEPENDENT_AMBULATORY_CARE_PROVIDER_SITE_OTHER): Payer: Managed Care, Other (non HMO) | Admitting: Pharmacist Clinician (PhC)/ Clinical Pharmacy Specialist

## 2017-03-02 DIAGNOSIS — Z954 Presence of other heart-valve replacement: Secondary | ICD-10-CM | POA: Diagnosis not present

## 2017-03-02 DIAGNOSIS — Z7901 Long term (current) use of anticoagulants: Secondary | ICD-10-CM | POA: Diagnosis not present

## 2017-03-02 LAB — POCT INR: INR: 2.1

## 2017-03-14 ENCOUNTER — Ambulatory Visit (INDEPENDENT_AMBULATORY_CARE_PROVIDER_SITE_OTHER): Payer: Managed Care, Other (non HMO) | Admitting: Pharmacist

## 2017-03-14 DIAGNOSIS — Z954 Presence of other heart-valve replacement: Secondary | ICD-10-CM

## 2017-03-14 DIAGNOSIS — Z7901 Long term (current) use of anticoagulants: Secondary | ICD-10-CM

## 2017-03-14 LAB — POCT INR: INR: 3.1

## 2017-04-04 ENCOUNTER — Ambulatory Visit (INDEPENDENT_AMBULATORY_CARE_PROVIDER_SITE_OTHER): Payer: Managed Care, Other (non HMO) | Admitting: Pharmacist

## 2017-04-04 DIAGNOSIS — Z7901 Long term (current) use of anticoagulants: Secondary | ICD-10-CM | POA: Diagnosis not present

## 2017-04-04 DIAGNOSIS — Z954 Presence of other heart-valve replacement: Secondary | ICD-10-CM

## 2017-04-04 LAB — POCT INR: INR: 3.3

## 2017-05-02 ENCOUNTER — Ambulatory Visit (INDEPENDENT_AMBULATORY_CARE_PROVIDER_SITE_OTHER): Payer: Managed Care, Other (non HMO) | Admitting: Pharmacist Clinician (PhC)/ Clinical Pharmacy Specialist

## 2017-05-02 DIAGNOSIS — Z7901 Long term (current) use of anticoagulants: Secondary | ICD-10-CM

## 2017-05-02 DIAGNOSIS — Z954 Presence of other heart-valve replacement: Secondary | ICD-10-CM

## 2017-05-02 LAB — POCT INR: INR: 2.6

## 2017-05-30 ENCOUNTER — Ambulatory Visit (INDEPENDENT_AMBULATORY_CARE_PROVIDER_SITE_OTHER): Payer: Managed Care, Other (non HMO) | Admitting: Pharmacist

## 2017-05-30 DIAGNOSIS — Z954 Presence of other heart-valve replacement: Secondary | ICD-10-CM

## 2017-05-30 DIAGNOSIS — Z7901 Long term (current) use of anticoagulants: Secondary | ICD-10-CM

## 2017-05-30 LAB — POCT INR: INR: 2.7

## 2017-07-04 ENCOUNTER — Ambulatory Visit (INDEPENDENT_AMBULATORY_CARE_PROVIDER_SITE_OTHER): Payer: Managed Care, Other (non HMO) | Admitting: Pharmacist Clinician (PhC)/ Clinical Pharmacy Specialist

## 2017-07-04 DIAGNOSIS — Z7901 Long term (current) use of anticoagulants: Secondary | ICD-10-CM | POA: Diagnosis not present

## 2017-07-04 DIAGNOSIS — Z954 Presence of other heart-valve replacement: Secondary | ICD-10-CM

## 2017-07-04 LAB — POCT INR: INR: 2.4

## 2017-07-31 ENCOUNTER — Other Ambulatory Visit (HOSPITAL_COMMUNITY): Payer: Self-pay | Admitting: Internal Medicine

## 2017-08-08 ENCOUNTER — Other Ambulatory Visit: Payer: Self-pay | Admitting: Cardiovascular Disease

## 2017-08-08 NOTE — Telephone Encounter (Signed)
Refill Request.  

## 2017-08-15 ENCOUNTER — Ambulatory Visit (INDEPENDENT_AMBULATORY_CARE_PROVIDER_SITE_OTHER): Payer: Managed Care, Other (non HMO) | Admitting: Pharmacist

## 2017-08-15 DIAGNOSIS — Z954 Presence of other heart-valve replacement: Secondary | ICD-10-CM

## 2017-08-15 DIAGNOSIS — Z7901 Long term (current) use of anticoagulants: Secondary | ICD-10-CM

## 2017-08-15 LAB — POCT INR: INR: 2.4

## 2017-09-04 ENCOUNTER — Other Ambulatory Visit (HOSPITAL_COMMUNITY): Payer: Self-pay | Admitting: Internal Medicine

## 2017-09-15 ENCOUNTER — Ambulatory Visit (INDEPENDENT_AMBULATORY_CARE_PROVIDER_SITE_OTHER): Payer: Managed Care, Other (non HMO) | Admitting: Pharmacist Clinician (PhC)/ Clinical Pharmacy Specialist

## 2017-09-15 DIAGNOSIS — Z954 Presence of other heart-valve replacement: Secondary | ICD-10-CM

## 2017-09-15 DIAGNOSIS — Z7901 Long term (current) use of anticoagulants: Secondary | ICD-10-CM | POA: Diagnosis not present

## 2017-09-15 LAB — POCT INR: INR: 2.9

## 2017-10-10 ENCOUNTER — Ambulatory Visit (INDEPENDENT_AMBULATORY_CARE_PROVIDER_SITE_OTHER): Payer: Managed Care, Other (non HMO) | Admitting: Pharmacist Clinician (PhC)/ Clinical Pharmacy Specialist

## 2017-10-10 DIAGNOSIS — Z954 Presence of other heart-valve replacement: Secondary | ICD-10-CM | POA: Diagnosis not present

## 2017-10-10 DIAGNOSIS — Z7901 Long term (current) use of anticoagulants: Secondary | ICD-10-CM

## 2017-10-10 LAB — POCT INR: INR: 2.7

## 2017-10-11 ENCOUNTER — Other Ambulatory Visit (HOSPITAL_COMMUNITY): Payer: Self-pay | Admitting: *Deleted

## 2017-10-11 MED ORDER — FUROSEMIDE 40 MG PO TABS: ORAL_TABLET | ORAL | 0 refills | Status: DC

## 2017-10-24 ENCOUNTER — Encounter (HOSPITAL_COMMUNITY): Payer: Self-pay

## 2017-11-07 ENCOUNTER — Encounter (HOSPITAL_COMMUNITY): Payer: Self-pay

## 2017-11-11 ENCOUNTER — Other Ambulatory Visit (HOSPITAL_COMMUNITY): Payer: Self-pay | Admitting: Internal Medicine

## 2017-11-14 ENCOUNTER — Ambulatory Visit (INDEPENDENT_AMBULATORY_CARE_PROVIDER_SITE_OTHER): Payer: Managed Care, Other (non HMO) | Admitting: Pharmacist Clinician (PhC)/ Clinical Pharmacy Specialist

## 2017-11-14 DIAGNOSIS — Z954 Presence of other heart-valve replacement: Secondary | ICD-10-CM

## 2017-11-14 DIAGNOSIS — I4892 Unspecified atrial flutter: Secondary | ICD-10-CM

## 2017-11-14 DIAGNOSIS — Z7901 Long term (current) use of anticoagulants: Secondary | ICD-10-CM | POA: Diagnosis not present

## 2017-11-14 LAB — POCT INR: INR: 2.2

## 2017-12-05 ENCOUNTER — Encounter (HOSPITAL_COMMUNITY): Payer: Self-pay

## 2017-12-05 ENCOUNTER — Ambulatory Visit (HOSPITAL_COMMUNITY)
Admission: RE | Admit: 2017-12-05 | Discharge: 2017-12-05 | Disposition: A | Discharge status: Home / Self Care | Payer: Managed Care, Other (non HMO) | Source: Ambulatory Visit | Attending: Internal Medicine | Admitting: Internal Medicine

## 2017-12-05 VITALS — BP 124/80 | HR 63 | Wt 241.0 lb

## 2017-12-05 DIAGNOSIS — N179 Acute kidney failure, unspecified: Secondary | ICD-10-CM | POA: Diagnosis not present

## 2017-12-05 DIAGNOSIS — Z7901 Long term (current) use of anticoagulants: Secondary | ICD-10-CM | POA: Diagnosis not present

## 2017-12-05 DIAGNOSIS — E669 Obesity, unspecified: Secondary | ICD-10-CM

## 2017-12-05 DIAGNOSIS — Z79899 Other long term (current) drug therapy: Secondary | ICD-10-CM | POA: Diagnosis not present

## 2017-12-05 DIAGNOSIS — Z7982 Long term (current) use of aspirin: Secondary | ICD-10-CM | POA: Insufficient documentation

## 2017-12-05 DIAGNOSIS — Z86718 Personal history of other venous thrombosis and embolism: Secondary | ICD-10-CM | POA: Diagnosis not present

## 2017-12-05 DIAGNOSIS — I2510 Atherosclerotic heart disease of native coronary artery without angina pectoris: Secondary | ICD-10-CM

## 2017-12-05 DIAGNOSIS — I340 Nonrheumatic mitral (valve) insufficiency: Secondary | ICD-10-CM | POA: Diagnosis present

## 2017-12-05 DIAGNOSIS — I890 Lymphedema, not elsewhere classified: Secondary | ICD-10-CM | POA: Diagnosis not present

## 2017-12-05 DIAGNOSIS — Z87891 Personal history of nicotine dependence: Secondary | ICD-10-CM | POA: Insufficient documentation

## 2017-12-05 DIAGNOSIS — I5032 Chronic diastolic (congestive) heart failure: Secondary | ICD-10-CM | POA: Insufficient documentation

## 2017-12-05 DIAGNOSIS — Z8674 Personal history of sudden cardiac arrest: Secondary | ICD-10-CM | POA: Insufficient documentation

## 2017-12-05 DIAGNOSIS — G4733 Obstructive sleep apnea (adult) (pediatric): Secondary | ICD-10-CM | POA: Insufficient documentation

## 2017-12-05 DIAGNOSIS — I872 Venous insufficiency (chronic) (peripheral): Secondary | ICD-10-CM

## 2017-12-05 DIAGNOSIS — I110 Hypertensive heart disease with heart failure: Secondary | ICD-10-CM | POA: Insufficient documentation

## 2017-12-05 DIAGNOSIS — I4430 Unspecified atrioventricular block: Secondary | ICD-10-CM | POA: Diagnosis not present

## 2017-12-05 DIAGNOSIS — R001 Bradycardia, unspecified: Secondary | ICD-10-CM | POA: Diagnosis not present

## 2017-12-05 DIAGNOSIS — Z952 Presence of prosthetic heart valve: Secondary | ICD-10-CM | POA: Diagnosis not present

## 2017-12-05 LAB — BASIC METABOLIC PANEL
ANION GAP: 6 (ref 5–15)
BUN: 15 mg/dL (ref 6–20)
CHLORIDE: 103 mmol/L (ref 101–111)
CO2: 26 mmol/L (ref 22–32)
Calcium: 8.9 mg/dL (ref 8.9–10.3)
Creatinine, Ser: 0.98 mg/dL (ref 0.61–1.24)
GFR calc Af Amer: >60 mL/min (ref >60)
GFR calc non Af Amer: >60 mL/min (ref >60)
GLUCOSE: 87 mg/dL (ref 65–99)
POTASSIUM: 4.6 mmol/L (ref 3.5–5.1)
Sodium: 135 mmol/L (ref 135–145)

## 2017-12-05 MED ORDER — PREDNISONE 50 MG PO TABS: ORAL_TABLET | ORAL | 0 refills | Status: DC

## 2017-12-05 MED ORDER — FUROSEMIDE 40 MG PO TABS: ORAL_TABLET | ORAL | 6 refills | Status: DC

## 2017-12-05 MED ORDER — FUROSEMIDE 40 MG PO TABS: ORAL_TABLET | ORAL | 0 refills | Status: DC

## 2017-12-05 MED ORDER — DIPHENHYDRAMINE HCL 50 MG PO TABS: ORAL_TABLET | ORAL | 0 refills | Status: DC

## 2017-12-05 NOTE — Progress Notes (Signed)
Patient ID: Oscar Brown, male   DOB: Sep 06, 1966, 52 y.o.   MRN: [id number]     Advanced Heart Failure Clinic Note   HPI:  Oscar Brown is a 52 y.o. male  with OSA and mitral regurgitation. 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 On POD #3  he had a sudden VF arrest with nearly an hour of resuscitation time. Follow-up transthoracic and transesophageal echocardiograms performed after the arrest revealed normal function of the mechanical prosthetic valve with severe dysfunction involving the inferior wall of the left ventricle EF35-40%. Cardiac enzymes went quite high and he had some bradycardia and AV block, all suggestive of possible inferior wall myocardial infarction. However, left ventricular function completely normalized prior to hospital discharge. Plans for diagnostic cardiac catheterization were postponed because the patient developed acute non-oliguric renal failure with creatinine up to 7.Because the patient's rhythm and left ventricular systolic function recovered completely, it was felt that the patient did not need defibrillator implantation. The patient was ultimately discharged from the hospital on the 18th postoperative day.   He presents today for follow up. Last seen ~ 2 years ago.  Has been doing great without complaint.  Taking coumadin without bleeding. Denies SOB or DOE.  Walks 1.5 - 2 miles to work without difficulty.  Taking lasix twice weekly. Occasionally needs an extra lasix but with no regularity.  Taking all medications as directed. Previously presented for his coronary CT but with allergy had not prepped, so turned away and was never rescheduled.   Echo 2/17 EF 45-50% inferior HK. MV ok   Review of systems complete and found to be negative unless listed in HPI.    SH:  Social History   Socioeconomic  History  . Marital status: Legally Separated    Spouse name: Not on file  . Number of children: 3  . Years of education: Not on file  . Highest education level: Not on file  Social Needs  . Financial resource strain: Not on file  . Food insecurity - worry: Not on file  . Food insecurity - inability: Not on file  . Transportation needs - medical: Not on file  . Transportation needs - non-medical: Not on file  Occupational History  . Not on file  Tobacco Use  . Smoking status: Former Smoker    Packs/day: 0.10    Years: 9.00    Pack years: 0.90    Types: Cigarettes    Last attempt to quit: 09/05/2012    Years since quitting: 5.2  . Smokeless tobacco: Never Used  Substance and Sexual Activity  . Alcohol use: No    Alcohol/week: 0.0 oz  . Drug use: No  . Sexual activity: Yes  Other Topics Concern  . Not on file  Social History Narrative  . Not on file    FH: No family history on file.  Past Medical History:  Diagnosis Date  . Acute diastolic congestive heart failure ([facility]) 09/21/2015   takes Lasix daily  . Acute renal failure ([facility])    ATN s/p cardiac arrest  . 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  Complicated by chronic left lower extremity edema and occasional recurrent left lower extremity cellulitis   . DVT (deep venous thrombosis) ([facility]) early 80's   left leg  . Incidental lung nodule, less than or equal to 23mm 11/10/2015  3 mm nodule right lung  . Lymphedema early 62's  . Mitral regurgitation 10/06/2012  . Obstructive sleep apnea 12/14/2006   AHI 19.2/hr, desaturation to 75% on Polysomnography 02/26/2005.  Uses 11 cm H2O nocturnal nasal CPAP with good symptom control.   . S/P minimally invasive mitral valve replacement with metallic valve 56/21/3086   33 mm Carbomedics Optiform bileaflet mechanical prosthesis placed via right mini-thoracotomy approach    Current Outpatient Medications  Medication Sig Dispense Refill  . aspirin  (ASPIR-81) 81 MG EC tablet Take 1 tablet (81 mg total) by mouth daily. Swallow whole. 30 tablet 12  . furosemide (LASIX) 40 MG tablet One tab twice a week. Needs office visit 10 tablet 0  . warfarin (COUMADIN) 5 MG tablet Take 1.5 to 2 tablets by mouth daily as directed by coumadin clinic 180 tablet 1  . warfarin (COUMADIN) 5 MG tablet TAKE ONE AND ONE-HALF (1 & 1/2) TABLET BY MOUTH TO TAKE TWO TABLETS BY MOUTH DAILY AS DIRECTED BY COUMADIN CLINIC 60 tablet 2  . amoxicillin (AMOXIL) 500 MG capsule     . diphenhydrAMINE (BENADRYL) 50 MG tablet Take 1 tab with prednisone 1 hr before CT 1 tablet 0  . predniSONE (DELTASONE) 50 MG tablet Take 1 tab 13 hrs before CT. Take 1 tab 7 hrs before CT. Take 1 tab 1 hr before CT. 3 tablet 0   No current facility-administered medications for this encounter.     Vitals:   12/05/17 1408  BP: 124/80  Pulse: 63  SpO2: 97%  Weight: 241 lb (109.3 kg)   Wt Readings from Last 3 Encounters:  12/05/17 241 lb (109.3 kg)  11/15/16 215 lb (97.5 kg)  05/26/16 217 lb (98.4 kg)    PHYSICAL EXAM:  General: Well appearing. No resp difficulty. HEENT: Normal Neck: Supple. JVP flat. Carotids 2+ bilat; no bruits. No thyromegaly or nodule noted. Cor: PMI nondisplaced. RRR, Mechanical S1. No other rubs, gall Lungs: CTAB, normal effort. Abdomen: Soft, non-tender, non-distended, no HSM. No bruits or masses. +BS  Extremities: No cyanosis, clubbing, or rash. Chronic lymphedema L>R. Neuro: Alert & orientedx3, cranial nerves grossly intact. moves all 4 extremities w/o difficulty. Affect pleasant   ASSESSMENT & PLAN: 1. Mitral regurgitation s/p mechanical MVR - Normal coronaries pre-op - Stable on exam - Repeat Echo to re-look. - Continue coumadin. Recent INR stable. Denies anything more than superficial bleeding with shaving.  2. Prolonged in-hospital post-cardiac arrest - VT/VF - due to acute RCA infarct - Plan coronary CT to further evaluate coronary anatomy. ( Not  done in past due to improper prep with contrast allergy. 3. Acute renal failure due to #2 - Resolved. BMET today.  4. Lymphedema in left leg - Stable.  - Continue compression hose.  5. HTN - Stable on current meds 6. Contrast allergy - Will prep with prednisone q 6 hrs x 3 and benadryl x 1 prior to Cor CT. Discussed and confirmed dose with Pharm-D.  7. Obesity - Up 24 lbs since last visit (2017). Very active. - Encouraged weight loss and continued exercise. Pt motivated to lose weight.   Doing well overall. Continue current medications. Plan for Cor CT as discussed above. RTC 2-3 with repeat Echo.  Shirley Friar, PA-C   Greater than 50% of the 30 minute visit was spent in counseling/coordination of care regarding disease state education, salt/fluid restriction, sliding scale diuretics, and medication compliance.

## 2017-12-05 NOTE — Patient Instructions (Signed)
Labs today We will only contact you if something comes back abnormal or we need to make some changes. Otherwise no news is good news!  Your physician has requested that you have cardiac CT. Cardiac computed tomography (CT) is a painless test that uses an x-ray machine to take clear, detailed pictures of your heart. For further information please visit [email or web address]. Please follow instruction sheet as given. PLEASE FILL YOUR PREP MEDICATIONS PRIOR TO YOUR CT     Your physician recommends that you schedule a follow-up appointment in: 2-3 months with Dr Haroldine Laws and echo  Your physician has requested that you have an echocardiogram. Echocardiography is a painless test that uses sound waves to create images of your heart. It provides your doctor with information about the size and shape of your heart and how well your heart's chambers and valves are working. This procedure takes approximately one hour. There are no restrictions for this procedure.  Do the following things EVERYDAY: 1) Weigh yourself in the morning before breakfast. Write it down and keep it in a log. 2) Take your medicines as prescribed 3) Eat low salt foods-Limit salt (sodium) to 2000 mg per day.  4) Stay as active as you can everyday 5) Limit all fluids for the day to less than 2 liters

## 2017-12-22 ENCOUNTER — Other Ambulatory Visit: Payer: Self-pay

## 2017-12-22 ENCOUNTER — Encounter (HOSPITAL_COMMUNITY): Payer: Self-pay | Admitting: *Deleted

## 2017-12-22 ENCOUNTER — Ambulatory Visit (HOSPITAL_COMMUNITY)
Admission: RE | Admit: 2017-12-22 | Discharge: 2017-12-22 | Disposition: A | Discharge status: Home / Self Care | Payer: Managed Care, Other (non HMO) | Source: Ambulatory Visit | Attending: Internal Medicine | Admitting: Internal Medicine

## 2017-12-22 ENCOUNTER — Ambulatory Visit (HOSPITAL_COMMUNITY): Payer: Managed Care, Other (non HMO)

## 2017-12-22 ENCOUNTER — Ambulatory Visit (HOSPITAL_BASED_OUTPATIENT_CLINIC_OR_DEPARTMENT_OTHER)
Admission: RE | Admit: 2017-12-22 | Discharge: 2017-12-22 | Disposition: A | Discharge status: Home / Self Care | Payer: Managed Care, Other (non HMO) | Source: Ambulatory Visit | Attending: Internal Medicine | Admitting: Internal Medicine

## 2017-12-22 ENCOUNTER — Encounter (HOSPITAL_COMMUNITY): Payer: Self-pay | Admitting: Internal Medicine

## 2017-12-22 ENCOUNTER — Encounter (HOSPITAL_COMMUNITY): Payer: Self-pay

## 2017-12-22 ENCOUNTER — Other Ambulatory Visit (HOSPITAL_COMMUNITY): Payer: Self-pay | Admitting: *Deleted

## 2017-12-22 VITALS — BP 132/74 | HR 121 | Wt 241.2 lb

## 2017-12-22 DIAGNOSIS — Z8674 Personal history of sudden cardiac arrest: Secondary | ICD-10-CM | POA: Insufficient documentation

## 2017-12-22 DIAGNOSIS — I890 Lymphedema, not elsewhere classified: Secondary | ICD-10-CM | POA: Insufficient documentation

## 2017-12-22 DIAGNOSIS — Z952 Presence of prosthetic heart valve: Secondary | ICD-10-CM | POA: Diagnosis not present

## 2017-12-22 DIAGNOSIS — I340 Nonrheumatic mitral (valve) insufficiency: Secondary | ICD-10-CM | POA: Insufficient documentation

## 2017-12-22 DIAGNOSIS — Z79899 Other long term (current) drug therapy: Secondary | ICD-10-CM | POA: Diagnosis not present

## 2017-12-22 DIAGNOSIS — E669 Obesity, unspecified: Secondary | ICD-10-CM | POA: Diagnosis not present

## 2017-12-22 DIAGNOSIS — Z954 Presence of other heart-valve replacement: Secondary | ICD-10-CM | POA: Diagnosis not present

## 2017-12-22 DIAGNOSIS — G4733 Obstructive sleep apnea (adult) (pediatric): Secondary | ICD-10-CM

## 2017-12-22 DIAGNOSIS — Z87891 Personal history of nicotine dependence: Secondary | ICD-10-CM | POA: Diagnosis not present

## 2017-12-22 DIAGNOSIS — I5032 Chronic diastolic (congestive) heart failure: Secondary | ICD-10-CM | POA: Diagnosis not present

## 2017-12-22 DIAGNOSIS — I2510 Atherosclerotic heart disease of native coronary artery without angina pectoris: Secondary | ICD-10-CM

## 2017-12-22 DIAGNOSIS — Z7982 Long term (current) use of aspirin: Secondary | ICD-10-CM | POA: Insufficient documentation

## 2017-12-22 DIAGNOSIS — I4892 Unspecified atrial flutter: Secondary | ICD-10-CM

## 2017-12-22 DIAGNOSIS — Z91041 Radiographic dye allergy status: Secondary | ICD-10-CM | POA: Insufficient documentation

## 2017-12-22 DIAGNOSIS — Z7901 Long term (current) use of anticoagulants: Secondary | ICD-10-CM | POA: Insufficient documentation

## 2017-12-22 DIAGNOSIS — I110 Hypertensive heart disease with heart failure: Secondary | ICD-10-CM | POA: Diagnosis not present

## 2017-12-22 DIAGNOSIS — Z6836 Body mass index (BMI) 36.0-36.9, adult: Secondary | ICD-10-CM | POA: Insufficient documentation

## 2017-12-22 DIAGNOSIS — Z86718 Personal history of other venous thrombosis and embolism: Secondary | ICD-10-CM | POA: Diagnosis not present

## 2017-12-22 LAB — CBC
HEMATOCRIT: 45.3 % (ref 39.0–52.0)
Hemoglobin: 15.2 g/dL (ref 13.0–17.0)
MCH: 29.6 pg (ref 26.0–34.0)
MCHC: 33.6 g/dL (ref 30.0–36.0)
MCV: 88.1 fL (ref 78.0–100.0)
Platelets: 234 10*3/uL (ref 150–400)
RBC: 5.14 MIL/uL (ref 4.22–5.81)
RDW: 14.2 % (ref 11.5–15.5)
WBC: 6.9 10*3/uL (ref 4.0–10.5)

## 2017-12-22 LAB — BASIC METABOLIC PANEL
Anion gap: 11 (ref 5–15)
BUN: 22 mg/dL — AB (ref 6–20)
CO2: 19 mmol/L — AB (ref 22–32)
Calcium: 9.3 mg/dL (ref 8.9–10.3)
Chloride: 106 mmol/L (ref 101–111)
Creatinine, Ser: 0.93 mg/dL (ref 0.61–1.24)
GFR calc Af Amer: >60 mL/min (ref >60)
GLUCOSE: 118 mg/dL — AB (ref 65–99)
POTASSIUM: 4.6 mmol/L (ref 3.5–5.1)
Sodium: 136 mmol/L (ref 135–145)

## 2017-12-22 LAB — PROTIME-INR
INR: 2.36
PROTHROMBIN TIME: 25.6 s — AB (ref 11.4–15.2)

## 2017-12-22 MED ORDER — IOPAMIDOL (ISOVUE-370) INJECTION 76%
INTRAVENOUS | Status: AC
  Filled 2017-12-22: qty 100

## 2017-12-22 NOTE — Patient Instructions (Signed)
Labs today  Your physician has recommended that you have a Cardioversion (DCCV). Electrical Cardioversion uses a jolt of electricity to your heart either through paddles or wired patches attached to your chest. This is a controlled, usually prescheduled, procedure. Defibrillation is done under light anesthesia in the hospital, and you usually go home the day of the procedure. This is done to get your heart back into a normal rhythm. You are not awake for the procedure. Please see the instruction sheet given to you today.  Your physician recommends that you schedule a follow-up appointment in: 2 months

## 2017-12-22 NOTE — Progress Notes (Signed)
Pt here for CT heart scan. Pt has been pre-medicated for his allergy. Pt HR 123, AFlutter. 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 Dr Aundra Dubin notified- MD who was to read test today. He spoke with pts MD and pt is instructed to go to the Heart Failure clinic now and cancel CT heart scan. Pts BP 113/87. Lowest HR on monitor was 121. Pt discharged from [facility] and states he will report to clinic as instructed.

## 2017-12-22 NOTE — H&P (View-Only) (Signed)
Patient ID: Oscar Brown, male   DOB: 02/12/1966, 52 y.o.   MRN: [id number]     Advanced Heart Failure Clinic Note     Cardiologist: Dr Haroldine Laws   HPI: Oscar Brown is a 52 y.o. male  with OSA and mitral regurgitation. 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 On POD #3  he had a sudden VF arrest with nearly an hour of resuscitation time. Follow-up transthoracic and transesophageal echocardiograms performed after the arrest revealed normal function of the mechanical prosthetic valve with severe dysfunction involving the inferior wall of the left ventricle EF35-40%. Cardiac enzymes went quite high and he had some bradycardia and AV block, all suggestive of possible inferior wall myocardial infarction. However, left ventricular function completely normalized prior to hospital discharge. Plans for diagnostic cardiac catheterization were postponed because the patient developed acute non-oliguric renal failure with creatinine up to 7.Because the patient's rhythm and left ventricular systolic function recovered completely, it was felt that the patient did not need defibrillator implantation. The patient was ultimately discharged from the hospital on the 18th postoperative day.   Today he presented for coronary CT and was in new A flutter so this was cancelled and he was referred here. On January 12th was dizzy, tired, and short of breath. BP was 108/72 and heart rate was 131. Symptoms resolved 8 hours later. Heart rate the next day 66. Has not checked his pulse since. Overall feeling fine. Denies SOB/PND/Orthopnea. Wears CPAP daily. No bleeding problems. Appetite ok. Able walk 1 mile without difficulty. No fever or chills. Taking all medications.    Echo 2/17 EF 45-50% inferior HK. MV ok   Review of systems complete and found to be negative  unless listed in HPI.    SH:  Social History   Socioeconomic History  . Marital status: Legally Separated    Spouse name: Not on file  . Number of children: 3  . Years of education: Not on file  . Highest education level: Not on file  Social Needs  . Financial resource strain: Not on file  . Food insecurity - worry: Not on file  . Food insecurity - inability: Not on file  . Transportation needs - medical: Not on file  . Transportation needs - non-medical: Not on file  Occupational History  . Not on file  Tobacco Use  . Smoking status: Former Smoker    Packs/day: 0.10    Years: 9.00    Pack years: 0.90    Types: Cigarettes    Last attempt to quit: 09/05/2012    Years since quitting: 5.2  . Smokeless tobacco: Never Used  Substance and Sexual Activity  . Alcohol use: No    Alcohol/week: 0.0 oz  . Drug use: No  . Sexual activity: Yes  Other Topics Concern  . Not on file  Social History Narrative  . Not on file    FH: No family history on file.  Past Medical History:  Diagnosis Date  . Acute diastolic congestive heart failure ([place]) 09/21/2015   takes Lasix daily  . Acute renal failure ([facility])    ATN s/p cardiac arrest  . 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  Complicated by chronic left lower extremity edema and occasional recurrent left lower extremity cellulitis   . DVT (deep venous thrombosis) ([place]) early 80's   left leg  .  Incidental lung nodule, less than or equal to 93mm 11/10/2015   3 mm nodule right lung  . Lymphedema early 9's  . Mitral regurgitation 10/06/2012  . Obstructive sleep apnea 12/14/2006   AHI 19.2/hr, desaturation to 75% on Polysomnography 02/26/2005.  Uses 11 cm H2O nocturnal nasal CPAP with good symptom control.   . 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 amoxicillin (AMOXIL) 500 MG capsule     . aspirin (ASPIR-81) 81 MG EC tablet Take 1 tablet (81 mg total) by mouth daily. Swallow whole. 30 tablet 12  . diphenhydrAMINE (BENADRYL) 50 MG tablet Take 1 tab with prednisone 1 hr before CT 1 tablet 0  . furosemide (LASIX) 40 MG tablet One tab twice a week. 10 tablet 6  . predniSONE (DELTASONE) 50 MG tablet Take 1 tab 13 hrs before CT. Take 1 tab 7 hrs before CT. Take 1 tab 1 hr before CT. 3 tablet 0  . warfarin (COUMADIN) 5 MG tablet Take 1.5 to 2 tablets by mouth daily as directed by coumadin clinic 180 tablet 1  . warfarin (COUMADIN) 5 MG tablet TAKE ONE AND ONE-HALF (1 & 1/2) TABLET BY MOUTH TO TAKE TWO TABLETS BY MOUTH DAILY AS DIRECTED BY COUMADIN CLINIC 60 tablet 2   No current facility-administered medications for this encounter.    Facility-Administered Medications Ordered in Other Encounters  Medication Dose Route Frequency Provider Last Rate Last Dose  . iopamidol (ISOVUE-370) 76 % injection             Vitals:   12/22/17 1447  BP: 132/74  Pulse: (!) 121  SpO2: 99%  Weight: 241 lb 4 oz (109.4 kg)   Wt Readings from Last 3 Encounters:  12/22/17 241 lb 4 oz (109.4 kg)  12/05/17 241 lb (109.3 kg)  11/15/16 215 lb (97.5 kg)    PHYSICAL EXAM: General:  Well appearing. No resp difficulty HEENT: normal Neck: supple. no JVD. Carotids 2+ bilat; no bruits. No lymphadenopathy or thryomegaly appreciated. Cor: PMI nondisplaced. Irregular rate & rhythm. Mechanical s1  No rubs, gallops or murmurs. Lungs: clear Abdomen: obese soft, nontender, nondistended. No hepatosplenomegaly. No bruits or masses. Good bowel sounds. Extremities: no cyanosis, clubbing, rash, edema Neuro: alert & orientedx3, cranial nerves grossly intact. moves all 4 extremities w/o difficulty. Affect pleasant   EKG: A flutter  97 bpm (Personally reviewed)   ASSESSMENT & PLAN: 1. Mitral regurgitation s/p  mechanical MVR - Normal coronaries pre-op - Continue coumadin. Recent INR stable. Denies anything more than superficial bleeding with shaving.  2. Prolonged in-hospital post-cardiac arrest - VT/VF - due to acute RCA infarct - Unable to obtain CT today due to A flutter.  3. H/o Acute renal failure due to #2 - Resolved. Creatinine normal on 12/05/2017.  4. Lymphedema in left leg - Stable.  - Continue compression hose.  5. HTN - Stable on current meds 6. Contrast allergy - Will prep with prednisone q 6 hrs x 3 and benadryl x 1 prior to Cor CT. Discussed and confirmed dose with Pharm-D.  7. Obesity Body mass index is 36.68 kg/m.  Encouraged weight loss and continued exercise. Pt motivated to lose weight.  8. New A flutter On coumadin. INR has been therapeutic. Check INR.  Set up for DC-CV in am.  May need referral to EP for  ablation.   Follow up in 8 weeks.  Darrick Grinder, NP   Patient seen and examined with Darrick Grinder, NP. We discussed all aspects of the encounter. I agree with the assessment and plan as stated above.   He has new onset AFL. Will arrange for DC-CV tomorrow. Says all INRs have been therapeutic. Will check INR today. If AFL recurs will need referral for AF ablation. BP looks good. Using CPAP every night.   Glori Bickers, MD  3:44 PM

## 2017-12-22 NOTE — Progress Notes (Signed)
Patient ID: Oscar Brown, male   DOB: 1966-08-02, 51 y.o.   MRN: [id number]     Advanced Heart Failure Clinic Note     Cardiologist: Dr Haroldine Laws   HPI: Oscar Brown is a 52 y.o. male  with OSA and mitral regurgitation. 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 On POD #3  he had a sudden VF arrest with nearly an hour of resuscitation time. Follow-up transthoracic and transesophageal echocardiograms performed after the arrest revealed normal function of the mechanical prosthetic valve with severe dysfunction involving the inferior wall of the left ventricle EF35-40%. Cardiac enzymes went quite high and he had some bradycardia and AV block, all suggestive of possible inferior wall myocardial infarction. However, left ventricular function completely normalized prior to hospital discharge. Plans for diagnostic cardiac catheterization were postponed because the patient developed acute non-oliguric renal failure with creatinine up to 7.Because the patient's rhythm and left ventricular systolic function recovered completely, it was felt that the patient did not need defibrillator implantation. The patient was ultimately discharged from the hospital on the 18th postoperative day.   Today he presented for coronary CT and was in new A flutter so this was cancelled and he was referred here. On January 12th was dizzy, tired, and short of breath. BP was 108/72 and heart rate was 131. Symptoms resolved 8 hours later. Heart rate the next day 66. Has not checked his pulse since. Overall feeling fine. Denies SOB/PND/Orthopnea. Wears CPAP daily. No bleeding problems. Appetite ok. Able walk 1 mile without difficulty. No fever or chills. Taking all medications.    Echo 2/17 EF 45-50% inferior HK. MV ok   Review of systems complete and found to be negative  unless listed in HPI.    SH:  Social History   Socioeconomic History  . Marital status: Legally Separated    Spouse name: Not on file  . Number of children: 3  . Years of education: Not on file  . Highest education level: Not on file  Social Needs  . Financial resource strain: Not on file  . Food insecurity - worry: Not on file  . Food insecurity - inability: Not on file  . Transportation needs - medical: Not on file  . Transportation needs - non-medical: Not on file  Occupational History  . Not on file  Tobacco Use  . Smoking status: Former Smoker    Packs/day: 0.10    Years: 9.00    Pack years: 0.90    Types: Cigarettes    Last attempt to quit: 09/05/2012    Years since quitting: 5.2  . Smokeless tobacco: Never Used  Substance and Sexual Activity  . Alcohol use: No    Alcohol/week: 0.0 oz  . Drug use: No  . Sexual activity: Yes  Other Topics Concern  . Not on file  Social History Narrative  . Not on file    FH: No family history on file.  Past Medical History:  Diagnosis Date  . Acute diastolic congestive heart failure ([place]) 09/21/2015   takes Lasix daily  . Acute renal failure ([facility])    ATN s/p cardiac arrest  . 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  Complicated by chronic left lower extremity edema and occasional recurrent left lower extremity cellulitis   . DVT (deep venous thrombosis) ([place]) early 80's   left leg  .  Incidental lung nodule, less than or equal to 29mm 11/10/2015   3 mm nodule right lung  . Lymphedema early 53's  . Mitral regurgitation 10/06/2012  . Obstructive sleep apnea 12/14/2006   AHI 19.2/hr, desaturation to 75% on Polysomnography 02/26/2005.  Uses 11 cm H2O nocturnal nasal CPAP with good symptom control.   . 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 amoxicillin (AMOXIL) 500 MG capsule     . aspirin (ASPIR-81) 81 MG EC tablet Take 1 tablet (81 mg total) by mouth daily. Swallow whole. 30 tablet 12  . diphenhydrAMINE (BENADRYL) 50 MG tablet Take 1 tab with prednisone 1 hr before CT 1 tablet 0  . furosemide (LASIX) 40 MG tablet One tab twice a week. 10 tablet 6  . predniSONE (DELTASONE) 50 MG tablet Take 1 tab 13 hrs before CT. Take 1 tab 7 hrs before CT. Take 1 tab 1 hr before CT. 3 tablet 0  . warfarin (COUMADIN) 5 MG tablet Take 1.5 to 2 tablets by mouth daily as directed by coumadin clinic 180 tablet 1  . warfarin (COUMADIN) 5 MG tablet TAKE ONE AND ONE-HALF (1 & 1/2) TABLET BY MOUTH TO TAKE TWO TABLETS BY MOUTH DAILY AS DIRECTED BY COUMADIN CLINIC 60 tablet 2   No current facility-administered medications for this encounter.    Facility-Administered Medications Ordered in Other Encounters  Medication Dose Route Frequency Provider Last Rate Last Dose  . iopamidol (ISOVUE-370) 76 % injection             Vitals:   12/22/17 1447  BP: 132/74  Pulse: (!) 121  SpO2: 99%  Weight: 241 lb 4 oz (109.4 kg)   Wt Readings from Last 3 Encounters:  12/22/17 241 lb 4 oz (109.4 kg)  12/05/17 241 lb (109.3 kg)  11/15/16 215 lb (97.5 kg)    PHYSICAL EXAM: General:  Well appearing. No resp difficulty HEENT: normal Neck: supple. no JVD. Carotids 2+ bilat; no bruits. No lymphadenopathy or thryomegaly appreciated. Cor: PMI nondisplaced. Irregular rate & rhythm. Mechanical s1  No rubs, gallops or murmurs. Lungs: clear Abdomen: obese soft, nontender, nondistended. No hepatosplenomegaly. No bruits or masses. Good bowel sounds. Extremities: no cyanosis, clubbing, rash, edema Neuro: alert & orientedx3, cranial nerves grossly intact. moves all 4 extremities w/o difficulty. Affect pleasant   EKG: A flutter  97 bpm (Personally reviewed)   ASSESSMENT & PLAN: 1. Mitral regurgitation s/p  mechanical MVR - Normal coronaries pre-op - Continue coumadin. Recent INR stable. Denies anything more than superficial bleeding with shaving.  2. Prolonged in-hospital post-cardiac arrest - VT/VF - due to acute RCA infarct - Unable to obtain CT today due to A flutter.  3. H/o Acute renal failure due to #2 - Resolved. Creatinine normal on 12/05/2017.  4. Lymphedema in left leg - Stable.  - Continue compression hose.  5. HTN - Stable on current meds 6. Contrast allergy - Will prep with prednisone q 6 hrs x 3 and benadryl x 1 prior to Cor CT. Discussed and confirmed dose with Pharm-D.  7. Obesity Body mass index is 36.68 kg/m.  Encouraged weight loss and continued exercise. Pt motivated to lose weight.  8. New A flutter On coumadin. INR has been therapeutic. Check INR.  Set up for DC-CV in am.  May need referral to EP for  ablation.   Follow up in 8 weeks.  Darrick Grinder, NP   Patient seen and examined with Darrick Grinder, NP. We discussed all aspects of the encounter. I agree with the assessment and plan as stated above.   He has new onset AFL. Will arrange for DC-CV tomorrow. Says all INRs have been therapeutic. Will check INR today. If AFL recurs will need referral for AF ablation. BP looks good. Using CPAP every night.   Glori Bickers, MD  3:44 PM

## 2017-12-23 ENCOUNTER — Ambulatory Visit (HOSPITAL_COMMUNITY)
Admission: RE | Admit: 2017-12-23 | Discharge: 2017-12-23 | Disposition: A | Discharge status: Home / Self Care | Payer: Managed Care, Other (non HMO) | Source: Ambulatory Visit | Attending: Cardiovascular Disease | Admitting: Cardiovascular Disease

## 2017-12-23 ENCOUNTER — Encounter (HOSPITAL_COMMUNITY): Payer: Self-pay | Admitting: *Deleted

## 2017-12-23 ENCOUNTER — Ambulatory Visit (HOSPITAL_COMMUNITY): Payer: Managed Care, Other (non HMO) | Admitting: Anesthesiology

## 2017-12-23 ENCOUNTER — Encounter (HOSPITAL_COMMUNITY)
Admission: RE | Disposition: A | Discharge status: Home / Self Care | Payer: Self-pay | Source: Ambulatory Visit | Attending: Cardiovascular Disease

## 2017-12-23 DIAGNOSIS — I110 Hypertensive heart disease with heart failure: Secondary | ICD-10-CM | POA: Insufficient documentation

## 2017-12-23 DIAGNOSIS — I4892 Unspecified atrial flutter: Secondary | ICD-10-CM | POA: Diagnosis not present

## 2017-12-23 DIAGNOSIS — Z952 Presence of prosthetic heart valve: Secondary | ICD-10-CM | POA: Diagnosis not present

## 2017-12-23 DIAGNOSIS — Z86718 Personal history of other venous thrombosis and embolism: Secondary | ICD-10-CM | POA: Insufficient documentation

## 2017-12-23 DIAGNOSIS — I890 Lymphedema, not elsewhere classified: Secondary | ICD-10-CM | POA: Diagnosis not present

## 2017-12-23 DIAGNOSIS — Z7982 Long term (current) use of aspirin: Secondary | ICD-10-CM | POA: Insufficient documentation

## 2017-12-23 DIAGNOSIS — I5032 Chronic diastolic (congestive) heart failure: Secondary | ICD-10-CM | POA: Insufficient documentation

## 2017-12-23 DIAGNOSIS — Z6836 Body mass index (BMI) 36.0-36.9, adult: Secondary | ICD-10-CM | POA: Diagnosis not present

## 2017-12-23 DIAGNOSIS — Z79899 Other long term (current) drug therapy: Secondary | ICD-10-CM | POA: Diagnosis not present

## 2017-12-23 DIAGNOSIS — I340 Nonrheumatic mitral (valve) insufficiency: Secondary | ICD-10-CM | POA: Diagnosis not present

## 2017-12-23 DIAGNOSIS — G4733 Obstructive sleep apnea (adult) (pediatric): Secondary | ICD-10-CM | POA: Insufficient documentation

## 2017-12-23 DIAGNOSIS — Z7901 Long term (current) use of anticoagulants: Secondary | ICD-10-CM | POA: Insufficient documentation

## 2017-12-23 DIAGNOSIS — Z87891 Personal history of nicotine dependence: Secondary | ICD-10-CM | POA: Diagnosis not present

## 2017-12-23 DIAGNOSIS — E669 Obesity, unspecified: Secondary | ICD-10-CM | POA: Diagnosis not present

## 2017-12-23 HISTORY — PX: CARDIOVERSION: SHX1299

## 2017-12-23 SURGERY — CARDIOVERSION
Anesthesia: General

## 2017-12-23 MED ORDER — PROPOFOL 10 MG/ML IV BOLUS
INTRAVENOUS | Status: DC | PRN
  Administered 2017-12-23: 60 mg via INTRAVENOUS
  Administered 2017-12-23 (×3): 20 mg via INTRAVENOUS

## 2017-12-23 MED ORDER — LIDOCAINE 2% (20 MG/ML) 5 ML SYRINGE
INTRAMUSCULAR | Status: DC | PRN
  Administered 2017-12-23: 40 mg via INTRAVENOUS

## 2017-12-23 MED ORDER — SODIUM CHLORIDE 0.9 % IV SOLN
INTRAVENOUS | Status: DC | PRN
  Administered 2017-12-23: 13:00:00 via INTRAVENOUS

## 2017-12-23 NOTE — Interval H&P Note (Signed)
History and Physical Interval Note:  12/23/2017 12:48 PM  Oscar Brown  has presented today for surgery, with the diagnosis of afib  The various methods of treatment have been discussed with the patient and family. After consideration of risks, benefits and other options for treatment, the patient has consented to  Procedure(s): CARDIOVERSION (N/A) as a surgical intervention .  The patient's history has been reviewed, patient examined, no change in status, stable for surgery.  I have reviewed the patient's chart and labs.  Questions were answered to the patient's satisfaction.     [name]

## 2017-12-23 NOTE — Transfer of Care (Signed)
Immediate Anesthesia Transfer of Care Note  Patient: Oscar Brown  Procedure(s) Performed: CARDIOVERSION (N/A )  Patient Location: Endoscopy Unit  Anesthesia Type:General  Level of Consciousness: awake, alert  and patient cooperative  Airway & Oxygen Therapy: Patient Spontanous Breathing  Post-op Assessment: Report given to RN and Post -op Vital signs reviewed and stable  Post vital signs: Reviewed and stable  Last Vitals:  Vitals:   12/23/17 1259 12/23/17 1300  BP:  124/78  Pulse: 60 63  Resp: 20 15  Temp:    SpO2: 100% 100%    Last Pain:  Vitals:   12/23/17 1207  TempSrc: Oral         Complications: No apparent anesthesia complications

## 2017-12-23 NOTE — Anesthesia Preprocedure Evaluation (Addendum)
Anesthesia Evaluation  Patient identified by MRN, date of birth, ID band Patient awake    Reviewed: Allergy & Precautions, NPO status , Patient's Chart, lab work & pertinent test results  Airway Mallampati: II  TM Distance: >3 FB Neck ROM: Full    Dental  (+) Teeth Intact, Dental Advisory Given   Pulmonary sleep apnea and Continuous Positive Airway Pressure Ventilation , former smoker,    breath sounds clear to auscultation       Cardiovascular + CAD, + Peripheral Vascular Disease and +CHF  + Valvular Problems/Murmurs  Rhythm:Irregular Rate:Abnormal  s/p Mitral Valve Replacement   Neuro/Psych negative neurological ROS  negative psych ROS   GI/Hepatic negative GI ROS, Neg liver ROS,   Endo/Other  negative endocrine ROS  Renal/GU Renal disease     Musculoskeletal negative musculoskeletal ROS (+)   Abdominal   Peds  Hematology   Anesthesia Other Findings   Reproductive/Obstetrics                           EKG: atrial fibrillation.   Anesthesia Physical Anesthesia Plan  ASA: III  Anesthesia Plan: General   Post-op Pain Management:    Induction: Intravenous  PONV Risk Score and Plan: Treatment may vary due to age or medical condition  Airway Management Planned: Natural Airway and Mask  Additional Equipment:   Intra-op Plan:   Post-operative Plan:   Informed Consent: I have reviewed the patients History and Physical, chart, labs and discussed the procedure including the risks, benefits and alternatives for the proposed anesthesia with the patient or authorized representative who has indicated his/her understanding and acceptance.     Plan Discussed with:   Anesthesia Plan Comments:        Anesthesia Quick Evaluation

## 2017-12-23 NOTE — Discharge Instructions (Signed)
Electrical Cardioversion, Care After °This sheet gives you information about how to care for yourself after your procedure. Your health care provider may also give you more specific instructions. If you have problems or questions, contact your health care provider. °What can I expect after the procedure? °After the procedure, it is common to have: °· Some redness on the skin where the shocks were given. ° °Follow these instructions at home: °· Do not drive for 24 hours if you were given a medicine to help you relax (sedative). °· Take over-the-counter and prescription medicines only as told by your health care provider. °· Ask your health care provider how to check your pulse. Check it often. °· Rest for 48 hours after the procedure or as told by your health care provider. °· Avoid or limit your caffeine use as told by your health care provider. °Contact a health care provider if: °· You feel like your heart is beating too quickly or your pulse is not regular. °· You have a serious muscle cramp that does not go away. °Get help right away if: °· You have discomfort in your chest. °· You are dizzy or you feel faint. °· You have trouble breathing or you are short of breath. °· Your speech is slurred. °· You have trouble moving an arm or leg on one side of your body. °· Your fingers or toes turn cold or blue. °This information is not intended to replace advice given to you by your health care provider. Make sure you discuss any questions you have with your health care provider. °Document Released: 09/05/2013 Document Revised: 06/18/2016 Document Reviewed: 05/21/2016 °Elsevier Interactive Patient Education © 2018 [employer] ° °

## 2017-12-23 NOTE — CV Procedure (Signed)
    Cardioversion Note  Oscar Brown [id number] 15-Jun-1966  Procedure: DC Cardioversion Indications: Atrial flutter   Procedure Details Consent: Obtained Time Out: Verified patient identification, verified procedure, site/side was marked, verified correct patient position, special equipment/implants available, Radiology Safety Procedures followed,  medications/allergies/relevent history reviewed, required imaging and test results available.  Performed  The patient has been on adequate anticoagulation.  The patient received Lidocaine 40 mg IV followed by Propofol 120 mg IV  for sedation.  Synchronous cardioversion was performed at 120  joules.  The cardioversion was successful.    Complications: No apparent complications Patient did tolerate procedure well.   Thayer Headings, Brooke Bonito., MD, [facility] 12/23/2017, 1:26 PM

## 2017-12-23 NOTE — Anesthesia Postprocedure Evaluation (Signed)
Anesthesia Post Note  Patient: Oscar Brown  Procedure(s) Performed: CARDIOVERSION (N/A )     Patient location during evaluation: PACU Anesthesia Type: General Level of consciousness: awake and alert Pain management: pain level controlled Vital Signs Assessment: post-procedure vital signs reviewed and stable Respiratory status: spontaneous breathing, nonlabored ventilation, respiratory function stable and patient connected to nasal cannula oxygen Cardiovascular status: blood pressure returned to baseline and stable Postop Assessment: no apparent nausea or vomiting Anesthetic complications: no    Last Vitals:  Vitals:   12/23/17 1310 12/23/17 1320  BP: 110/65 111/69  Pulse:  (!) 53  Resp: 16 13  Temp: 36.7 C   SpO2: 100% 100%    Last Pain:  Vitals:   12/23/17 1310  TempSrc: Oral                 Effie Berkshire

## 2017-12-23 NOTE — Anesthesia Procedure Notes (Signed)
Procedure Name: General with mask airway Date/Time: 12/23/2017 12:51 PM Performed by: White, Amedeo Plenty, CRNA Pre-anesthesia Checklist: Patient identified, Emergency Drugs available, Suction available, Patient being monitored and Timeout performed Patient Re-evaluated:Patient Re-evaluated prior to induction Oxygen Delivery Method: Ambu bag Preoxygenation: Pre-oxygenation with 100% oxygen

## 2017-12-25 ENCOUNTER — Encounter (HOSPITAL_COMMUNITY): Payer: Self-pay | Admitting: Cardiovascular Disease

## 2017-12-26 ENCOUNTER — Ambulatory Visit (INDEPENDENT_AMBULATORY_CARE_PROVIDER_SITE_OTHER): Payer: Managed Care, Other (non HMO) | Admitting: Pharmacist

## 2017-12-26 DIAGNOSIS — Z954 Presence of other heart-valve replacement: Secondary | ICD-10-CM | POA: Diagnosis not present

## 2017-12-26 DIAGNOSIS — Z7901 Long term (current) use of anticoagulants: Secondary | ICD-10-CM | POA: Diagnosis not present

## 2017-12-26 LAB — POCT INR: INR: 2.1

## 2018-01-03 ENCOUNTER — Other Ambulatory Visit: Payer: Self-pay | Admitting: Cardiovascular Disease

## 2018-01-03 NOTE — Telephone Encounter (Signed)
Refill Request.  

## 2018-01-23 ENCOUNTER — Ambulatory Visit (INDEPENDENT_AMBULATORY_CARE_PROVIDER_SITE_OTHER): Payer: Managed Care, Other (non HMO) | Admitting: Pharmacist Clinician (PhC)/ Clinical Pharmacy Specialist

## 2018-01-23 DIAGNOSIS — I4892 Unspecified atrial flutter: Secondary | ICD-10-CM

## 2018-01-23 DIAGNOSIS — Z7901 Long term (current) use of anticoagulants: Secondary | ICD-10-CM

## 2018-01-23 DIAGNOSIS — Z954 Presence of other heart-valve replacement: Secondary | ICD-10-CM

## 2018-01-23 LAB — POCT INR: INR: 2.5

## 2018-01-23 NOTE — Patient Instructions (Signed)
Description   Continue with 2 tablets every Monday and 1.5 tablets all other days of the week.  Repeat INR in 4 weeks

## 2018-02-20 ENCOUNTER — Ambulatory Visit (HOSPITAL_BASED_OUTPATIENT_CLINIC_OR_DEPARTMENT_OTHER)
Admission: RE | Admit: 2018-02-20 | Discharge: 2018-02-20 | Disposition: A | Discharge status: Home / Self Care | Payer: Managed Care, Other (non HMO) | Source: Ambulatory Visit | Attending: Internal Medicine | Admitting: Internal Medicine

## 2018-02-20 ENCOUNTER — Ambulatory Visit (HOSPITAL_COMMUNITY)
Admission: RE | Admit: 2018-02-20 | Discharge: 2018-02-20 | Disposition: A | Discharge status: Home / Self Care | Payer: Managed Care, Other (non HMO) | Source: Ambulatory Visit | Attending: Internal Medicine | Admitting: Internal Medicine

## 2018-02-20 ENCOUNTER — Ambulatory Visit (INDEPENDENT_AMBULATORY_CARE_PROVIDER_SITE_OTHER): Payer: Managed Care, Other (non HMO) | Admitting: Pharmacist Clinician (PhC)/ Clinical Pharmacy Specialist

## 2018-02-20 ENCOUNTER — Encounter (HOSPITAL_COMMUNITY): Payer: Self-pay | Admitting: Internal Medicine

## 2018-02-20 ENCOUNTER — Encounter (HOSPITAL_COMMUNITY): Payer: Self-pay | Admitting: *Deleted

## 2018-02-20 ENCOUNTER — Other Ambulatory Visit (HOSPITAL_COMMUNITY): Payer: Self-pay | Admitting: *Deleted

## 2018-02-20 VITALS — BP 118/90 | HR 129 | Wt 244.8 lb

## 2018-02-20 DIAGNOSIS — Z8674 Personal history of sudden cardiac arrest: Secondary | ICD-10-CM

## 2018-02-20 DIAGNOSIS — Z86718 Personal history of other venous thrombosis and embolism: Secondary | ICD-10-CM | POA: Insufficient documentation

## 2018-02-20 DIAGNOSIS — I890 Lymphedema, not elsewhere classified: Secondary | ICD-10-CM | POA: Diagnosis not present

## 2018-02-20 DIAGNOSIS — R001 Bradycardia, unspecified: Secondary | ICD-10-CM | POA: Diagnosis not present

## 2018-02-20 DIAGNOSIS — I484 Atypical atrial flutter: Secondary | ICD-10-CM | POA: Insufficient documentation

## 2018-02-20 DIAGNOSIS — I4892 Unspecified atrial flutter: Secondary | ICD-10-CM

## 2018-02-20 DIAGNOSIS — E669 Obesity, unspecified: Secondary | ICD-10-CM | POA: Diagnosis not present

## 2018-02-20 DIAGNOSIS — I5032 Chronic diastolic (congestive) heart failure: Secondary | ICD-10-CM | POA: Diagnosis not present

## 2018-02-20 DIAGNOSIS — Z79899 Other long term (current) drug therapy: Secondary | ICD-10-CM | POA: Diagnosis not present

## 2018-02-20 DIAGNOSIS — Z952 Presence of prosthetic heart valve: Secondary | ICD-10-CM | POA: Insufficient documentation

## 2018-02-20 DIAGNOSIS — I110 Hypertensive heart disease with heart failure: Secondary | ICD-10-CM | POA: Insufficient documentation

## 2018-02-20 DIAGNOSIS — I340 Nonrheumatic mitral (valve) insufficiency: Secondary | ICD-10-CM | POA: Diagnosis not present

## 2018-02-20 DIAGNOSIS — Z7901 Long term (current) use of anticoagulants: Secondary | ICD-10-CM | POA: Insufficient documentation

## 2018-02-20 DIAGNOSIS — Z87891 Personal history of nicotine dependence: Secondary | ICD-10-CM | POA: Diagnosis not present

## 2018-02-20 DIAGNOSIS — Z6837 Body mass index (BMI) 37.0-37.9, adult: Secondary | ICD-10-CM | POA: Diagnosis not present

## 2018-02-20 DIAGNOSIS — I2510 Atherosclerotic heart disease of native coronary artery without angina pectoris: Secondary | ICD-10-CM

## 2018-02-20 DIAGNOSIS — G4733 Obstructive sleep apnea (adult) (pediatric): Secondary | ICD-10-CM | POA: Insufficient documentation

## 2018-02-20 DIAGNOSIS — Z954 Presence of other heart-valve replacement: Secondary | ICD-10-CM

## 2018-02-20 DIAGNOSIS — Z7982 Long term (current) use of aspirin: Secondary | ICD-10-CM | POA: Insufficient documentation

## 2018-02-20 LAB — BASIC METABOLIC PANEL
ANION GAP: 11 (ref 5–15)
BUN: 14 mg/dL (ref 6–20)
CHLORIDE: 105 mmol/L (ref 101–111)
CO2: 22 mmol/L (ref 22–32)
Calcium: 9.2 mg/dL (ref 8.9–10.3)
Creatinine, Ser: 1.1 mg/dL (ref 0.61–1.24)
Glucose, Bld: 96 mg/dL (ref 65–99)
POTASSIUM: 4 mmol/L (ref 3.5–5.1)
SODIUM: 138 mmol/L (ref 135–145)

## 2018-02-20 LAB — CBC
HCT: 43.6 % (ref 39.0–52.0)
Hemoglobin: 14.4 g/dL (ref 13.0–17.0)
MCH: 29 pg (ref 26.0–34.0)
MCHC: 33 g/dL (ref 30.0–36.0)
MCV: 87.7 fL (ref 78.0–100.0)
Platelets: 240 K/uL (ref 150–400)
RBC: 4.97 MIL/uL (ref 4.22–5.81)
RDW: 14.4 % (ref 11.5–15.5)
WBC: 5 K/uL (ref 4.0–10.5)

## 2018-02-20 LAB — MAGNESIUM: Magnesium: 2 mg/dL (ref 1.7–2.4)

## 2018-02-20 LAB — POCT INR: INR: 3.1

## 2018-02-20 NOTE — Patient Instructions (Signed)
Labs done today  Your physician has recommended that you have a Cardioversion (DCCV). Electrical Cardioversion uses a jolt of electricity to your heart either through paddles or wired patches attached to your chest. This is a controlled, usually prescheduled, procedure. Defibrillation is done under light anesthesia in the hospital, and you usually go home the day of the procedure. This is done to get your heart back into a normal rhythm. You are not awake for the procedure. Please see the instruction sheet given to you today.  SEE INSTRUCTION SHEET,  You have been referred to Dr Lovena Le for an Ablation  Your physician recommends that you schedule a follow-up appointment in: 2 months   Electrical Cardioversion Electrical cardioversion is the delivery of a jolt of electricity to restore a normal rhythm to the heart. A rhythm that is too fast or is not regular keeps the heart from pumping well. In this procedure, sticky patches or metal paddles are placed on the chest to deliver electricity to the heart from a device. This procedure may be done in an emergency if:  There is low or no blood pressure as a result of the heart rhythm.  Normal rhythm must be restored as fast as possible to protect the brain and heart from further damage.  It may save a life.  This procedure may also be done for irregular or fast heart rhythms that are not immediately life-threatening. Tell a health care provider about:  Any allergies you have.  All medicines you are taking, including vitamins, herbs, eye drops, creams, and over-the-counter medicines.  Any problems you or family members have had with anesthetic medicines.  Any blood disorders you have.  Any surgeries you have had.  Any medical conditions you have.  Whether you are pregnant or may be pregnant. What are the risks? Generally, this is a safe procedure. However, problems may occur, including:  Allergic reactions to medicines.  A blood clot  that breaks free and travels to other parts of your body.  The possible return of an abnormal heart rhythm within hours or days after the procedure.  Your heart stopping (cardiac arrest). This is rare.  What happens before the procedure? Medicines  Your health care provider may have you start taking: ? Blood-thinning medicines (anticoagulants) so your blood does not clot as easily. ? Medicines may be given to help stabilize your heart rate and rhythm.  Ask your health care provider about changing or stopping your regular medicines. This is especially important if you are taking diabetes medicines or blood thinners. General instructions  Plan to have someone take you home from the hospital or clinic.  If you will be going home right after the procedure, plan to have someone with you for 24 hours.  Follow instructions from your health care provider about eating or drinking restrictions. What happens during the procedure?  To lower your risk of infection: ? Your health care team will wash or sanitize their hands. ? Your skin will be washed with soap.  An IV tube will be inserted into one of your veins.  You will be given a medicine to help you relax (sedative).  Sticky patches (electrodes) or metal paddles may be placed on your chest.  An electrical shock will be delivered. The procedure may vary among health care providers and hospitals. What happens after the procedure?  Your blood pressure, heart rate, breathing rate, and blood oxygen level will be monitored until the medicines you were given have worn off.  Do not drive for 24 hours if you were given a sedative.  Your heart rhythm will be watched to make sure it does not change. This information is not intended to replace advice given to you by your health care provider. Make sure you discuss any questions you have with your health care provider. Document Released: 11/05/2002 Document Revised: 07/14/2016 Document  Reviewed: 05/21/2016 Elsevier Interactive Patient Education  2017 [employer].

## 2018-02-20 NOTE — Progress Notes (Signed)
  Echocardiogram 2D Echocardiogram has been performed.  Merrie Roof F 02/20/2018, 12:11 PM

## 2018-02-20 NOTE — Progress Notes (Signed)
Patient ID: Oscar Brown, male   DOB: [birth year], 52 y.o.   MRN: [id number]     Advanced Heart Failure Clinic Note     Cardiologist: Dr Haroldine Laws   HPI: Oscar Brown is a 52 y.o. male  with OSA and mitral regurgitation. 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 On POD #3  he had a sudden VF arrest with nearly an hour of resuscitation time. Follow-up transthoracic and transesophageal echocardiograms performed after the arrest revealed normal function of the mechanical prosthetic valve with severe dysfunction involving the inferior wall of the left ventricle EF35-40%. Cardiac enzymes went quite high and he had some bradycardia and AV block, all suggestive of possible inferior wall myocardial infarction. However, left ventricular function completely normalized prior to hospital discharge. Plans for diagnostic cardiac catheterization were postponed because the patient developed acute non-oliguric renal failure with creatinine up to 7.Because the patient's rhythm and left ventricular systolic function recovered completely, it was felt that the patient did not need defibrillator implantation. The patient was ultimately discharged from the hospital on the 18th postoperative day.   He presents today for follow up with Echo. Had DCCV 12/23/17 to NSR. Now back in AFL by EKG. He has been feeling good overall. Denies palpitations  He loaded a truck full of pine needles/mulch at his house this am. He denies any SOB/PND/Orthopnea. No lightheadedness or dizziness. Wearing CPAP daily. Able to walk > 1 mile without difficulty. Taking all medications as directed. He is very frustrated to be back in AFL.  EKG today shows Atypical atrial flutter RVR at 127 bpm. Personally reviewed   Echo today EF ~50% inferior HK RV mild to moderately HK. Septal flattening.  Personally reviewed   Echo 2/17 EF 45-50% inferior HK. MV ok   Review of systems complete and found to be negative unless listed in HPI.    SH:  Social History   Socioeconomic History  . Marital status: Legally Separated    Spouse name: Not on file  . Number of children: 3  . Years of education: Not on file  . Highest education level: Not on file  Occupational History  . Not on file  Social Needs  . Financial resource strain: Not on file  . Food insecurity:    Worry: Not on file    Inability: Not on file  . Transportation needs:    Medical: Not on file    Non-medical: Not on file  Tobacco Use  . Smoking status: Former Smoker    Packs/day: 0.10    Years: 9.00    Pack years: 0.90    Types: Cigarettes    Last attempt to quit: 09/05/2012    Years since quitting: 5.4  . Smokeless tobacco: Never Used  Substance and Sexual Activity  . Alcohol use: No    Alcohol/week: 0.0 oz  . Drug use: No  . Sexual activity: Yes  Lifestyle  . Physical activity:    Days per week: Not on file    Minutes per session: Not on file  . Stress: Not on file  Relationships  . Social connections:    Talks on phone: Not on file    Gets together: Not on file    Attends religious service: Not on file    Active member of club or organization: Not on file    Attends meetings of clubs or organizations: Not  on file    Relationship status: Not on file  . Intimate partner violence:    Fear of current or ex partner: Not on file    Emotionally abused: Not on file    Physically abused: Not on file    Forced sexual activity: Not on file  Other Topics Concern  . Not on file  Social History Narrative  . Not on file   FH:  - Denies history of sudden cardiac death.  Past Medical History:  Diagnosis Date  . Acute diastolic congestive heart failure ([place]) 09/21/2015   takes Lasix daily  . Acute renal failure (HCC)    ATN s/p cardiac arrest  . Chronic venous hypertension due to deep vein thrombosis  12/14/2006   1993.  Complicated by chronic left lower extremity edema and occasional recurrent left lower extremity cellulitis   . DVT (deep venous thrombosis) ([place]) early 80's   left leg  . Incidental lung nodule, less than or equal to 22mm 11/10/2015   3 mm nodule right lung  . Lymphedema early 70's  . Mitral regurgitation 10/06/2012  . Obstructive sleep apnea 12/14/2006   AHI 19.2/hr, desaturation to 75% on Polysomnography 02/26/2005.  Uses 11 cm H2O nocturnal nasal CPAP with good symptom control.   . S/P minimally invasive mitral valve replacement with metallic valve 76/28/3151   33 mm Carbomedics Optiform bileaflet mechanical prosthesis placed via right mini-thoracotomy approach    Current Outpatient Medications  Medication Sig Dispense Refill  . aspirin (ASPIR-81) 81 MG EC tablet Take 1 tablet (81 mg total) by mouth daily. Swallow whole. 30 tablet 12  . furosemide (LASIX) 40 MG tablet One tab twice a week. (Patient taking differently: Take 40 mg by mouth See admin instructions. Take 40 mg by mouth twice weekly on Monday and Friday) 10 tablet 6  . warfarin (COUMADIN) 5 MG tablet TAKE ONE AND ONE-HALF (1  1/2) TABLET TO  TWO TABLETS BY MOUTH DAILY AS DIRECTED BY COUMADIN CLINIC 120 tablet 1   No current facility-administered medications for this encounter.     Vitals:   02/20/18 1210  BP: 118/90  Pulse: (!) 129  SpO2: 97%  Weight: 244 lb 12.8 oz (111 kg)   Wt Readings from Last 3 Encounters:  02/20/18 244 lb 12.8 oz (111 kg)  12/23/17 241 lb (109.3 kg)  12/22/17 241 lb 4 oz (109.4 kg)    PHYSICAL EXAM: General: Well appearing. No resp difficulty. HEENT: Normal Neck: Supple. JVP ~6-7 cm. Carotids 2+ bilat; no bruits. No thyromegaly or nodule noted. Cor: PMI nondisplaced. Irregularly irregular. Tachy. Mechanical S1. crisp Lungs: CTAB, normal effort. Abdomen: Soft, non-tender, non-distended, no HSM. No bruits or masses. +BS  Extremities: No cyanosis, clubbing, or rash. R  no  edema. mild edema on L  Neuro: Alert & orientedx3, cranial nerves grossly intact. moves all 4 extremities w/o difficulty. Affect pleasant but tearful  EKG: A flutter 127 bpm, personally reviewed.  ASSESSMENT & PLAN:  1. Recurrent A flutter, atypical - s/p DCCV 12/23/17. - Now back in atypical AFL with RVR. Asymptomatic. EF ok on echop  - I reviewed personally with Dr. Lovena Le will plan RFA this week or early next - On coumadin for MVR 2. Mitral regurgitation s/p mechanical MVR - Normal coronaries pre-op - Continue coumadin.  - Stable on Echo today - Personally reviewed -  INR stable.  - Has resultant RV strain due longstanding MV disease -> may need RHC in future 3. Prolonged in-hospital post-cardiac arrest -  VT/VF - due to acute RCA infarct likely due to post-op embolic episode - Have been unable to obtain CTA due to scheduling issues .  4. Lymphedema in left leg - Stable.  - Continue compression hose.  5. HTN - Stable on current meds.  6. Contrast allergy - Would need prep with prednisone q 6 hrs x 3 and benadryl x 1 prior to Cor CT. Have previously discussed and confirmed dose with Pharm-D.  7. Obesity - Body mass index is 37.22 kg/m.  - Encouraged weight loss and exercise.   Glori Bickers, MD  11:34 PM

## 2018-02-21 ENCOUNTER — Encounter (HOSPITAL_COMMUNITY): Admission: RE | Payer: Self-pay | Source: Ambulatory Visit

## 2018-02-21 ENCOUNTER — Ambulatory Visit (HOSPITAL_COMMUNITY)
Admission: RE | Admit: 2018-02-21 | Payer: Managed Care, Other (non HMO) | Source: Ambulatory Visit | Admitting: Internal Medicine

## 2018-02-21 SURGERY — CARDIOVERSION
Anesthesia: General

## 2018-02-22 ENCOUNTER — Encounter: Payer: Self-pay | Admitting: Internal Medicine

## 2018-02-22 ENCOUNTER — Ambulatory Visit (INDEPENDENT_AMBULATORY_CARE_PROVIDER_SITE_OTHER): Payer: Managed Care, Other (non HMO) | Admitting: Internal Medicine

## 2018-02-22 VITALS — BP 140/78 | HR 72 | Ht 68.0 in | Wt 243.0 lb

## 2018-02-22 DIAGNOSIS — I4892 Unspecified atrial flutter: Secondary | ICD-10-CM

## 2018-02-22 NOTE — H&P (View-Only) (Signed)
HPI Mr. Oscar Brown is referred today by Dr. Reine Just for evaluation of atrial flutter. He underwent MV replacement over 2 years ago after having rheumatic heart disease. He was noted to have atrial flutter several weeks ago and has undergone DCCV but had recurrent atrial flutter. He does not have much in the way of palpitations. No other complaints. His EF is normal by recent echo. He has been therapeutic with his anti-coagulation.  Allergies  Allergen Reactions  . Iohexol Hives, Itching and Other (See Comments)    Patient needs 13 hour prep per Dr. Pascal Lux, after cath he broke out in hives and had itching      Current Outpatient Medications  Medication Sig Dispense Refill  . aspirin (ASPIR-81) 81 MG EC tablet Take 1 tablet (81 mg total) by mouth daily. Swallow whole. 30 tablet 12  . furosemide (LASIX) 40 MG tablet One tab twice a week. (Patient taking differently: Take 40 mg by mouth See admin instructions. Take 40 mg by mouth twice weekly on Monday and Friday) 10 tablet 6  . warfarin (COUMADIN) 5 MG tablet TAKE ONE AND ONE-HALF (1  1/2) TABLET TO  TWO TABLETS BY MOUTH DAILY AS DIRECTED BY COUMADIN CLINIC (Patient taking differently: Take 7.5-10 mg by mouth See admin instructions. TAKE ONE AND ONE-HALF (1  1/2) (all other days) TABLET TO  TWO TABLETS (Monday and Friday) BY MOUTH DAILY AS DIRECTED BY COUMADIN CLINIC) 120 tablet 1   No current facility-administered medications for this visit.      Past Medical History:  Diagnosis Date  . Acute diastolic congestive heart failure ([place]) 09/21/2015   takes Lasix daily  . Acute renal failure (HCC)    ATN s/p cardiac arrest  . 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  Complicated by chronic left lower extremity edema and occasional recurrent left lower extremity cellulitis   . DVT (deep venous thrombosis) ([place]) early 80's   left leg  . Incidental lung nodule, less than or equal to 31mm 11/10/2015   3 mm nodule  right lung  . Lymphedema early 62's  . Mitral regurgitation 10/06/2012  . Obstructive sleep apnea 12/14/2006   AHI 19.2/hr, desaturation to 75% on Polysomnography 02/26/2005.  Uses 11 cm H2O nocturnal nasal CPAP with good symptom control.   . S/P minimally invasive mitral valve replacement with metallic valve 96/78/9381   33 mm Carbomedics Optiform bileaflet mechanical prosthesis placed via right mini-thoracotomy approach    ROS:   All systems reviewed and negative except as noted in the HPI.   Past Surgical History:  Procedure Laterality Date  . CARDIAC CATHETERIZATION N/A 11/03/2015   Procedure: Right/Left Heart Cath and Coronary Angiography;  Surgeon: Leonie Man, MD;  Location: [facility];  Service: Cardiovascular;  Laterality: N/A;  . CARDIOVERSION N/A 12/23/2017   Procedure: CARDIOVERSION;  Surgeon: Thayer Headings, MD;  Location: [facility] ENDOSCOPY;  Service: Cardiovascular;  Laterality: N/A;  . MITRAL VALVE REPLACEMENT Right 11/13/2015   Procedure: MINIMALLY INVASIVE MITRAL VALVE (MV) REPLACEMENT;  Surgeon: [name], MD;  Location: [facility];  Service: Open Heart Surgery;  Laterality: Right;  . MULTIPLE EXTRACTIONS WITH ALVEOLOPLASTY N/A 11/07/2015   Procedure: Extraction of tooth #'s 1,2,8,9,12,15,16,17, 32 with alveoloplasty and gross debridement of remaining teeth.;  Surgeon: [name], DDS;  Location: [facility];  Service: Oral Surgery;  Laterality: N/A;  . TEE WITHOUT CARDIOVERSION N/A 10/16/2015   Procedure: TRANSESOPHAGEAL ECHOCARDIOGRAM (TEE);  Surgeon: Skeet Latch, MD;  Location: [facility];  Service: Cardiovascular;  Laterality: N/A;  . TEE WITHOUT CARDIOVERSION N/A 11/13/2015   Procedure: TRANSESOPHAGEAL ECHOCARDIOGRAM (TEE);  Surgeon: [name], MD;  Location: [facility];  Service: Open Heart Surgery;  Laterality: N/A;     No family history on file.   Social History   Socioeconomic History  . Marital status: Legally Separated    Spouse name:  Not on file  . Number of children: 3  . Years of education: Not on file  . Highest education level: Not on file  Occupational History  . Not on file  Social Needs  . Financial resource strain: Not on file  . Food insecurity:    Worry: Not on file    Inability: Not on file  . Transportation needs:    Medical: Not on file    Non-medical: Not on file  Tobacco Use  . Smoking status: Former Smoker    Packs/day: 0.10    Years: 9.00    Pack years: 0.90    Types: Cigarettes    Last attempt to quit: 09/05/2012    Years since quitting: 5.4  . Smokeless tobacco: Never Used  Substance and Sexual Activity  . Alcohol use: No    Alcohol/week: 0.0 oz  . Drug use: No  . Sexual activity: Yes  Lifestyle  . Physical activity:    Days per week: Not on file    Minutes per session: Not on file  . Stress: Not on file  Relationships  . Social connections:    Talks on phone: Not on file    Gets together: Not on file    Attends religious service: Not on file    Active member of club or organization: Not on file    Attends meetings of clubs or organizations: Not on file    Relationship status: Not on file  . Intimate partner violence:    Fear of current or ex partner: Not on file    Emotionally abused: Not on file    Physically abused: Not on file    Forced sexual activity: Not on file  Other Topics Concern  . Not on file  Social History Narrative  . Not on file     BP 140/78   Pulse 72   Ht 5\' 8"  (1.727 m)   Wt 243 lb (110.2 kg)   SpO2 99%   BMI 36.95 kg/m   Physical Exam:  Well appearing middle aged man, NAD HEENT: Unremarkable Neck:  No JVD, no thyromegally Lymphatics:  No adenopathy Back:  No CVA tenderness Lungs:  Clear with no wheezes HEART:  Regular rate rhythm, no murmurs, no rubs, no clicks, mechanical S1.  Abd:  soft, positive bowel sounds, no organomegally, no rebound, no guarding Ext:  2 plus pulses, no edema, no cyanosis, no clubbing Skin:  No rashes no  nodules Neuro:  CN II through XII intact, motor grossly intact  EKG - reviewed with typical atrial flutter   Assess/Plan: 1. Atrial flutter - his symptoms are not particularly impressive but he is at risk for the development of a tachycardia induced CM. I have reviewed the indications/risks/benefits/goals/expectations of EP study and ablation and he wishes to proceed. 2. MVR - he is taking his warfarin and his valve function appears to be normal. 3. HTN - his blood pressure is up but he admits to being a bit anxious. Will consider medication increase in time.  Mikle Bosworth.D.

## 2018-02-22 NOTE — Patient Instructions (Addendum)
Medication Instructions:  Your physician recommends that you continue on your current medications as directed. Please refer to the Current Medication list given to you today.  Labwork: You will come to the [facility] office on March 13, 2018 anytime between 8:00 am and 5:00 pm to get lab work.  You do not need to be fasting.  Testing/Procedures: Your physician has recommended that you have an ablation. Catheter ablation is a medical procedure used to treat some cardiac arrhythmias (irregular heartbeats). During catheter ablation, a long, thin, flexible tube is put into a blood vessel in your groin (upper thigh), or neck. This tube is called an ablation catheter. It is then guided to your heart through the blood vessel. Radio frequency waves destroy small areas of heart tissue where abnormal heartbeats may cause an arrhythmia to start. Please see the instruction sheet given to you today.  Follow-Up:  You will follow up with Dr. Lovena Le 4 weeks after your procedure.   Any Other Special Instructions Will Be Listed Below (If Applicable).  Please arrive at the [facility] main entrance of [place] hospital at:  5:30 am on March 20, 2018 Do not eat or drink after midnight prior to procedure Do not take your WARFARIN the night before your procedure. Plan for one night stay-but you may be discharged. You will need someone to drive you home at discharge   If you need a refill on your cardiac medications before your next appointment, please call your pharmacy.   Cardiac Ablation Cardiac ablation is a procedure to disable (ablate) a small amount of heart tissue in very specific places. The heart has many electrical connections. Sometimes these connections are abnormal and can cause the heart to beat very fast or irregularly. Ablating some of the problem areas can improve the heart rhythm or return it to normal. Ablation may be done for people who:  Have Wolff-Parkinson-White syndrome.  Have fast  heart rhythms (tachycardia).  Have taken medicines for an abnormal heart rhythm (arrhythmia) that were not effective or caused side effects.  Have a high-risk heartbeat that may be life-threatening.  During the procedure, a small incision is made in the neck or the groin, and a long, thin, flexible tube (catheter) is inserted into the incision and moved to the heart. Small devices (electrodes) on the tip of the catheter will send out electrical currents. A type of X-ray (fluoroscopy) will be used to help guide the catheter and to provide images of the heart. Tell a health care provider about:  Any allergies you have.  All medicines you are taking, including vitamins, herbs, eye drops, creams, and over-the-counter medicines.  Any problems you or family members have had with anesthetic medicines.  Any blood disorders you have.  Any surgeries you have had.  Any medical conditions you have, such as kidney failure.  Whether you are pregnant or may be pregnant. What are the risks? Generally, this is a safe procedure. However, problems may occur, including:  Infection.  Bruising and bleeding at the catheter insertion site.  Bleeding into the chest, especially into the sac that surrounds the heart. This is a serious complication.  Stroke or blood clots.  Damage to other structures or organs.  Allergic reaction to medicines or dyes.  Need for a permanent pacemaker if the normal electrical system is damaged. A pacemaker is a small computer that sends electrical signals to the heart and helps your heart beat normally.  The procedure not being fully effective. This may  not be recognized until months later. Repeat ablation procedures are sometimes required.  What happens before the procedure?  Follow instructions from your health care provider about eating or drinking restrictions.  Ask your health care provider about: ? Changing or stopping your regular medicines. This is especially  important if you are taking diabetes medicines or blood thinners. ? Taking medicines such as aspirin and ibuprofen. These medicines can thin your blood. Do not take these medicines before your procedure if your health care provider instructs you not to.  Plan to have someone take you home from the hospital or clinic.  If you will be going home right after the procedure, plan to have someone with you for 24 hours. What happens during the procedure?  To lower your risk of infection: ? Your health care team will wash or sanitize their hands. ? Your skin will be washed with soap. ? Hair may be removed from the incision area.  An IV tube will be inserted into one of your veins.  You will be given a medicine to help you relax (sedative).  The skin on your neck or groin will be numbed.  An incision will be made in your neck or your groin.  A needle will be inserted through the incision and into a large vein in your neck or groin.  A catheter will be inserted into the needle and moved to your heart.  Dye may be injected through the catheter to help your surgeon see the area of the heart that needs treatment.  Electrical currents will be sent from the catheter to ablate heart tissue in desired areas. There are three types of energy that may be used to ablate heart tissue: ? Heat (radiofrequency energy). ? Laser energy. ? Extreme cold (cryoablation).  When the necessary tissue has been ablated, the catheter will be removed.  Pressure will be held on the catheter insertion area to prevent excessive bleeding.  A bandage (dressing) will be placed over the catheter insertion area. The procedure may vary among health care providers and hospitals. What happens after the procedure?  Your blood pressure, heart rate, breathing rate, and blood oxygen level will be monitored until the medicines you were given have worn off.  Your catheter insertion area will be monitored for bleeding. You will  need to lie still for a few hours to ensure that you do not bleed from the catheter insertion area.  Do not drive for 24 hours or as long as directed by your health care provider. Summary  Cardiac ablation is a procedure to disable (ablate) a small amount of heart tissue in very specific places. Ablating some of the problem areas can improve the heart rhythm or return it to normal.  During the procedure, electrical currents will be sent from the catheter to ablate heart tissue in desired areas. This information is not intended to replace advice given to you by your health care provider. Make sure you discuss any questions you have with your health care provider. Document Released: 04/03/2009 Document Revised: 10/04/2016 Document Reviewed: 10/04/2016 Elsevier Interactive Patient Education  [employer].

## 2018-02-22 NOTE — Progress Notes (Signed)
HPI Mr. Titsworth is referred today by Dr. Reine Just for evaluation of atrial flutter. He underwent MV replacement over 2 years ago after having rheumatic heart disease. He was noted to have atrial flutter several weeks ago and has undergone DCCV but had recurrent atrial flutter. He does not have much in the way of palpitations. No other complaints. His EF is normal by recent echo. He has been therapeutic with his anti-coagulation.  Allergies  Allergen Reactions  . Iohexol Hives, Itching and Other (See Comments)    Patient needs 13 hour prep per Dr. Pascal Lux, after cath he broke out in hives and had itching      Current Outpatient Medications  Medication Sig Dispense Refill  . aspirin (ASPIR-81) 81 MG EC tablet Take 1 tablet (81 mg total) by mouth daily. Swallow whole. 30 tablet 12  . furosemide (LASIX) 40 MG tablet One tab twice a week. (Patient taking differently: Take 40 mg by mouth See admin instructions. Take 40 mg by mouth twice weekly on Monday and Friday) 10 tablet 6  . warfarin (COUMADIN) 5 MG tablet TAKE ONE AND ONE-HALF (1  1/2) TABLET TO  TWO TABLETS BY MOUTH DAILY AS DIRECTED BY COUMADIN CLINIC (Patient taking differently: Take 7.5-10 mg by mouth See admin instructions. TAKE ONE AND ONE-HALF (1  1/2) (all other days) TABLET TO  TWO TABLETS (Monday and Friday) BY MOUTH DAILY AS DIRECTED BY COUMADIN CLINIC) 120 tablet 1   No current facility-administered medications for this visit.      Past Medical History:  Diagnosis Date  . Acute diastolic congestive heart failure ([place]) 09/21/2015   takes Lasix daily  . Acute renal failure ([facility])    ATN s/p cardiac arrest  . 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  Complicated by chronic left lower extremity edema and occasional recurrent left lower extremity cellulitis   . DVT (deep venous thrombosis) ([place]) early 80's   left leg  . Incidental lung nodule, less than or equal to 54mm 11/10/2015   3 mm nodule  right lung  . Lymphedema early 55's  . Mitral regurgitation 10/06/2012  . Obstructive sleep apnea 12/14/2006   AHI 19.2/hr, desaturation to 75% on Polysomnography 02/26/2005.  Uses 11 cm H2O nocturnal nasal CPAP with good symptom control.   . S/P minimally invasive mitral valve replacement with metallic valve 86/76/1950   33 mm Carbomedics Optiform bileaflet mechanical prosthesis placed via right mini-thoracotomy approach    ROS:   All systems reviewed and negative except as noted in the HPI.   Past Surgical History:  Procedure Laterality Date  . CARDIAC CATHETERIZATION N/A 11/03/2015   Procedure: Right/Left Heart Cath and Coronary Angiography;  Surgeon: Leonie Man, MD;  Location: [facility];  Service: Cardiovascular;  Laterality: N/A;  . CARDIOVERSION N/A 12/23/2017   Procedure: CARDIOVERSION;  Surgeon: Thayer Headings, MD;  Location: [facility] ENDOSCOPY;  Service: Cardiovascular;  Laterality: N/A;  . MITRAL VALVE REPLACEMENT Right 11/13/2015   Procedure: MINIMALLY INVASIVE MITRAL VALVE (MV) REPLACEMENT;  Surgeon: [name], MD;  Location: [place];  Service: Open Heart Surgery;  Laterality: Right;  . MULTIPLE EXTRACTIONS WITH ALVEOLOPLASTY N/A 11/07/2015   Procedure: Extraction of tooth #'s 1,2,8,9,12,15,16,17, 32 with alveoloplasty and gross debridement of remaining teeth.;  Surgeon: [name], DDS;  Location: [place];  Service: Oral Surgery;  Laterality: N/A;  . TEE WITHOUT CARDIOVERSION N/A 10/16/2015   Procedure: TRANSESOPHAGEAL ECHOCARDIOGRAM (TEE);  Surgeon: Skeet Latch, MD;  Location: [place];  Service: Cardiovascular;  Laterality: N/A;  . TEE WITHOUT CARDIOVERSION N/A 11/13/2015   Procedure: TRANSESOPHAGEAL ECHOCARDIOGRAM (TEE);  Surgeon: [name], MD;  Location: [place];  Service: Open Heart Surgery;  Laterality: N/A;     No family history on file.   Social History   Socioeconomic History  . Marital status: Legally Separated    Spouse name:  Not on file  . Number of children: 3  . Years of education: Not on file  . Highest education level: Not on file  Occupational History  . Not on file  Social Needs  . Financial resource strain: Not on file  . Food insecurity:    Worry: Not on file    Inability: Not on file  . Transportation needs:    Medical: Not on file    Non-medical: Not on file  Tobacco Use  . Smoking status: Former Smoker    Packs/day: 0.10    Years: 9.00    Pack years: 0.90    Types: Cigarettes    Last attempt to quit: 09/05/2012    Years since quitting: 5.4  . Smokeless tobacco: Never Used  Substance and Sexual Activity  . Alcohol use: No    Alcohol/week: 0.0 oz  . Drug use: No  . Sexual activity: Yes  Lifestyle  . Physical activity:    Days per week: Not on file    Minutes per session: Not on file  . Stress: Not on file  Relationships  . Social connections:    Talks on phone: Not on file    Gets together: Not on file    Attends religious service: Not on file    Active member of club or organization: Not on file    Attends meetings of clubs or organizations: Not on file    Relationship status: Not on file  . Intimate partner violence:    Fear of current or ex partner: Not on file    Emotionally abused: Not on file    Physically abused: Not on file    Forced sexual activity: Not on file  Other Topics Concern  . Not on file  Social History Narrative  . Not on file     BP 140/78   Pulse 72   Ht 5\' 8"  (1.727 m)   Wt 243 lb (110.2 kg)   SpO2 99%   BMI 36.95 kg/m   Physical Exam:  Well appearing middle aged man, NAD HEENT: Unremarkable Neck:  No JVD, no thyromegally Lymphatics:  No adenopathy Back:  No CVA tenderness Lungs:  Clear with no wheezes HEART:  Regular rate rhythm, no murmurs, no rubs, no clicks, mechanical S1.  Abd:  soft, positive bowel sounds, no organomegally, no rebound, no guarding Ext:  2 plus pulses, no edema, no cyanosis, no clubbing Skin:  No rashes no  nodules Neuro:  CN II through XII intact, motor grossly intact  EKG - reviewed with typical atrial flutter   Assess/Plan: 1. Atrial flutter - his symptoms are not particularly impressive but he is at risk for the development of a tachycardia induced CM. I have reviewed the indications/risks/benefits/goals/expectations of EP study and ablation and he wishes to proceed. 2. MVR - he is taking his warfarin and his valve function appears to be normal. 3. HTN - his blood pressure is up but he admits to being a bit anxious. Will consider medication increase in time.  Mikle Bosworth.D.

## 2018-03-03 ENCOUNTER — Other Ambulatory Visit: Payer: Self-pay | Admitting: Cardiovascular Disease

## 2018-03-03 NOTE — Telephone Encounter (Signed)
Please review for refill, Thanks !  

## 2018-03-13 ENCOUNTER — Other Ambulatory Visit: Payer: Managed Care, Other (non HMO) | Admitting: *Deleted

## 2018-03-13 DIAGNOSIS — I4892 Unspecified atrial flutter: Secondary | ICD-10-CM

## 2018-03-13 LAB — BASIC METABOLIC PANEL
BUN / CREAT RATIO: 16 (ref 9–20)
BUN: 16 mg/dL (ref 6–24)
CO2: 20 mmol/L (ref 20–29)
CREATININE: 1.03 mg/dL (ref 0.76–1.27)
Calcium: 9.4 mg/dL (ref 8.7–10.2)
Chloride: 104 mmol/L (ref 96–106)
GFR calc Af Amer: 97 mL/min/{1.73_m2} (ref >59)
GFR calc non Af Amer: 84 mL/min/{1.73_m2} (ref >59)
GLUCOSE: 86 mg/dL (ref 65–99)
Potassium: 4.1 mmol/L (ref 3.5–5.2)
SODIUM: 140 mmol/L (ref 134–144)

## 2018-03-13 LAB — CBC WITH DIFFERENTIAL/PLATELET
BASOS ABS: 0 10*3/uL (ref 0.0–0.2)
Basos: 1 %
EOS (ABSOLUTE): 0.1 10*3/uL (ref 0.0–0.4)
Eos: 2 %
HEMOGLOBIN: 15 g/dL (ref 13.0–17.7)
Hematocrit: 43.7 % (ref 37.5–51.0)
Immature Grans (Abs): 0 10*3/uL (ref 0.0–0.1)
Immature Granulocytes: 0 %
LYMPHS ABS: 1.4 10*3/uL (ref 0.7–3.1)
Lymphs: 37 %
MCH: 29.1 pg (ref 26.6–33.0)
MCHC: 34.3 g/dL (ref 31.5–35.7)
MCV: 85 fL (ref 79–97)
MONOCYTES: 6 %
MONOS ABS: 0.2 10*3/uL (ref 0.1–0.9)
NEUTROS ABS: 2.1 10*3/uL (ref 1.4–7.0)
Neutrophils: 54 %
Platelets: 186 10*3/uL (ref 150–379)
RBC: 5.15 x10E6/uL (ref 4.14–5.80)
RDW: 14 % (ref 12.3–15.4)
WBC: 3.8 10*3/uL (ref 3.4–10.8)

## 2018-03-13 LAB — PROTIME-INR
INR: 3.3 — AB (ref 0.8–1.2)
Prothrombin Time: 32.1 s — ABNORMAL HIGH (ref 9.1–12.0)

## 2018-03-20 ENCOUNTER — Ambulatory Visit (HOSPITAL_COMMUNITY)
Admission: RE | Admit: 2018-03-20 | Discharge: 2018-03-20 | Disposition: A | Discharge status: Home / Self Care | Payer: Managed Care, Other (non HMO) | Source: Ambulatory Visit | Attending: Internal Medicine | Admitting: Internal Medicine

## 2018-03-20 ENCOUNTER — Encounter (HOSPITAL_COMMUNITY)
Admission: RE | Disposition: A | Discharge status: Home / Self Care | Payer: Self-pay | Source: Ambulatory Visit | Attending: Internal Medicine

## 2018-03-20 DIAGNOSIS — Z87891 Personal history of nicotine dependence: Secondary | ICD-10-CM | POA: Diagnosis not present

## 2018-03-20 DIAGNOSIS — Z952 Presence of prosthetic heart valve: Secondary | ICD-10-CM | POA: Diagnosis not present

## 2018-03-20 DIAGNOSIS — Z86718 Personal history of other venous thrombosis and embolism: Secondary | ICD-10-CM | POA: Diagnosis not present

## 2018-03-20 DIAGNOSIS — Z7901 Long term (current) use of anticoagulants: Secondary | ICD-10-CM | POA: Insufficient documentation

## 2018-03-20 DIAGNOSIS — I4892 Unspecified atrial flutter: Secondary | ICD-10-CM | POA: Diagnosis present

## 2018-03-20 DIAGNOSIS — G4733 Obstructive sleep apnea (adult) (pediatric): Secondary | ICD-10-CM | POA: Diagnosis not present

## 2018-03-20 DIAGNOSIS — Z7982 Long term (current) use of aspirin: Secondary | ICD-10-CM | POA: Insufficient documentation

## 2018-03-20 DIAGNOSIS — I483 Typical atrial flutter: Secondary | ICD-10-CM | POA: Insufficient documentation

## 2018-03-20 DIAGNOSIS — I10 Essential (primary) hypertension: Secondary | ICD-10-CM | POA: Diagnosis not present

## 2018-03-20 DIAGNOSIS — Z8674 Personal history of sudden cardiac arrest: Secondary | ICD-10-CM | POA: Insufficient documentation

## 2018-03-20 HISTORY — PX: A-FLUTTER ABLATION: EP1230

## 2018-03-20 LAB — PROTIME-INR
INR: 3.09
PROTHROMBIN TIME: 31.6 s — AB (ref 11.4–15.2)

## 2018-03-20 SURGERY — A-FLUTTER ABLATION

## 2018-03-20 MED ORDER — HEPARIN (PORCINE) IN NACL 1000-0.9 UT/500ML-% IV SOLN
INTRAVENOUS | Status: AC
  Filled 2018-03-20: qty 500

## 2018-03-20 MED ORDER — BUPIVACAINE HCL (PF) 0.25 % IJ SOLN
INTRAMUSCULAR | Status: DC | PRN
  Administered 2018-03-20: 50 mL

## 2018-03-20 MED ORDER — MIDAZOLAM HCL 5 MG/5ML IJ SOLN
INTRAMUSCULAR | Status: AC
  Filled 2018-03-20: qty 5

## 2018-03-20 MED ORDER — FENTANYL CITRATE (PF) 100 MCG/2ML IJ SOLN
INTRAMUSCULAR | Status: AC
  Filled 2018-03-20: qty 2

## 2018-03-20 MED ORDER — HEPARIN (PORCINE) IN NACL 2-0.9 UNITS/ML
INTRAMUSCULAR | Status: AC | PRN
  Administered 2018-03-20: 500 mL

## 2018-03-20 MED ORDER — BUPIVACAINE HCL (PF) 0.25 % IJ SOLN
INTRAMUSCULAR | Status: AC
  Filled 2018-03-20: qty 60

## 2018-03-20 MED ORDER — SODIUM CHLORIDE 0.9 % IV SOLN
INTRAVENOUS | Status: DC
  Administered 2018-03-20: 07:00:00 via INTRAVENOUS

## 2018-03-20 MED ORDER — MIDAZOLAM HCL 5 MG/5ML IJ SOLN
INTRAMUSCULAR | Status: DC | PRN
  Administered 2018-03-20: 1 mg via INTRAVENOUS
  Administered 2018-03-20 (×2): 2 mg via INTRAVENOUS
  Administered 2018-03-20 (×5): 1 mg via INTRAVENOUS

## 2018-03-20 MED ORDER — ONDANSETRON HCL 4 MG/2ML IJ SOLN: 4.0000 mg | Freq: Four times a day (QID) | INTRAMUSCULAR | Status: DC | PRN

## 2018-03-20 MED ORDER — FENTANYL CITRATE (PF) 100 MCG/2ML IJ SOLN
INTRAMUSCULAR | Status: DC | PRN
  Administered 2018-03-20: 25 ug via INTRAVENOUS
  Administered 2018-03-20: 12.5 ug via INTRAVENOUS
  Administered 2018-03-20 (×3): 25 ug via INTRAVENOUS
  Administered 2018-03-20: 12.5 ug via INTRAVENOUS

## 2018-03-20 SURGICAL SUPPLY — 13 items
BAG SNAP BAND KOVER 36X36 (MISCELLANEOUS) ×2 IMPLANT
CATH BLAZERPRIME XP (ABLATOR) ×2 IMPLANT
CATH DUODECA HALO/ISMUS 7FR (CATHETERS) ×2 IMPLANT
CATH JOSEPHSON QUAD-ALLRED 6FR (CATHETERS) ×2 IMPLANT
CATH POLARIS X 2.5/5/2.5 DECAP (CATHETERS) ×2 IMPLANT
KIT MICROPUNCTURE NIT STIFF (SHEATH) ×2 IMPLANT
PACK EP LATEX FREE (CUSTOM PROCEDURE TRAY) ×3
PACK EP LF (CUSTOM PROCEDURE TRAY) IMPLANT
PAD DEFIB LIFELINK (PAD) ×2 IMPLANT
SHEATH AVANTI 11CM 6FR (SHEATH) ×2 IMPLANT
SHEATH AVANTI 11CM 7FR (SHEATH) ×2 IMPLANT
SHEATH AVANTI 11CM 8FR (SHEATH) ×4 IMPLANT
SHIELD RADPAD SCOOP 12X17 (MISCELLANEOUS) ×2 IMPLANT

## 2018-03-20 NOTE — Discharge Instructions (Signed)
Post procedure care instructions No driving for 4 days. No lifting over 5 lbs for 1 week. No vigorous or sexual activity for 1 week. You may return to work on 03/27/18. Keep procedure site clean & dry. If you notice increased pain, swelling, bleeding or pus, call/return!  You may shower, but no soaking baths/hot tubs/pools for 1 week.     Cardiac Ablation, Care After This sheet gives you information about how to care for yourself after your procedure. Your health care provider may also give you more specific instructions. If you have problems or questions, contact your health care provider. What can I expect after the procedure? After the procedure, it is common to have: Bruising around your puncture site. Tenderness around your puncture site. Skipped heartbeats. Tiredness (fatigue).  Follow these instructions at home: Puncture site care Follow instructions from your health care provider about how to take care of your puncture site. Make sure you: Wash your hands with soap and water before you change your bandage (dressing). If soap and water are not available, use hand sanitizer. Change your dressing as told by your health care provider. Leave stitches (sutures), skin glue, or adhesive strips in place. These skin closures may need to stay in place for up to 2 weeks. If adhesive strip edges start to loosen and curl up, you may trim the loose edges. Do not remove adhesive strips completely unless your health care provider tells you to do that. Check your puncture site every day for signs of infection. Check for: Redness, swelling, or pain. Fluid or blood. If your puncture site starts to bleed, lie down on your back, apply firm pressure to the area, and contact your health care provider. Warmth. Pus or a bad smell. Driving Ask your health care provider when it is safe for you to drive again after the procedure. Do not drive or use heavy machinery while taking prescription pain medicine. Do  not drive for 24 hours if you were given a medicine to help you relax (sedative) during your procedure. Activity Avoid activities that take a lot of effort for at least 3 days after your procedure. Do not lift anything that is heavier than 10 lb (4.5 kg), or the limit that you are told, until your health care provider says that it is safe. Return to your normal activities as told by your health care provider. Ask your health care provider what activities are safe for you. General instructions Take over-the-counter and prescription medicines only as told by your health care provider. Do not use any products that contain nicotine or tobacco, such as cigarettes and e-cigarettes. If you need help quitting, ask your health care provider. Do not take baths, swim, or use a hot tub until your health care provider approves. Do not drink alcohol for 24 hours after your procedure. Keep all follow-up visits as told by your health care provider. This is important. Contact a health care provider if: You have redness, mild swelling, or pain around your puncture site. You have fluid or blood coming from your puncture site that stops after applying firm pressure to the area. Your puncture site feels warm to the touch. You have pus or a bad smell coming from your puncture site. You have a fever. You have chest pain or discomfort that spreads to your neck, jaw, or arm. You are sweating a lot. You feel nauseous. You have a fast or irregular heartbeat. You have shortness of breath. You are dizzy or light-headed and feel  the need to lie down. You have pain or numbness in the arm or leg closest to your puncture site. Get help right away if: Your puncture site suddenly swells. Your puncture site is bleeding and the bleeding does not stop after applying firm pressure to the area. These symptoms may represent a serious problem that is an emergency. Do not wait to see if the symptoms will go away. Get medical help  right away. Call your local emergency services (911 in the [place] Do not drive yourself to the hospital. Summary After the procedure, it is normal to have bruising and tenderness at the puncture site in your groin, neck, or forearm. Check your puncture site every day for signs of infection. Get help right away if your puncture site is bleeding and the bleeding does not stop after applying firm pressure to the area. This is a medical emergency. This information is not intended to replace advice given to you by your health care provider. Make sure you discuss any questions you have with your health care provider. Document Released: 02/24/2017 Document Revised: 02/24/2017 Document Reviewed: 02/24/2017 Elsevier Interactive Patient Education  2018 [place]. Femoral Site Care Refer to this sheet in the next few weeks. These instructions provide you with information about caring for yourself after your procedure. Your health care provider may also give you more specific instructions. Your treatment has been planned according to current medical practices, but problems sometimes occur. Call your health care provider if you have any problems or questions after your procedure. What can I expect after the procedure? After your procedure, it is typical to have the following:  Bruising at the site that usually fades within 1-2 weeks.  Blood collecting in the tissue (hematoma) that may be painful to the touch. It should usually decrease in size and tenderness within 1-2 weeks.  Follow these instructions at home:  Take medicines only as directed by your health care provider.  You may shower 24-48 hours after the procedure or as directed by your health care provider. Remove the bandage (dressing) and gently wash the site with plain soap and water. Pat the area dry with a clean towel. Do not rub the site, because this may cause bleeding.  Do not take baths, swim, or use a hot tub until your health care  provider approves.  Check your insertion site every day for redness, swelling, or drainage.  Do not apply powder or lotion to the site.  Limit use of stairs to twice a day for the first 2-3 days or as directed by your health care provider.  Do not squat for the first 2-3 days or as directed by your health care provider.  Do not lift over 10 lb (4.5 kg) for 5 days after your procedure or as directed by your health care provider.  Ask your health care provider when it is okay to: ? Return to work or school. ? Resume usual physical activities or sports. ? Resume sexual activity.  Do not drive home if you are discharged the same day as the procedure. Have someone else drive you.  You may drive 24 hours after the procedure unless otherwise instructed by your health care provider.  Do not operate machinery or power tools for 24 hours after the procedure or as directed by your health care provider.  If your procedure was done as an outpatient procedure, which means that you went home the same day as your procedure, a responsible adult should be with you  for the first 24 hours after you arrive home.  Keep all follow-up visits as directed by your health care provider. This is important. Contact a health care provider if:  You have a fever.  You have chills.  You have increased bleeding from the site. Hold pressure on the site. Get help right away if:  You have unusual pain at the site.  You have redness, warmth, or swelling at the site.  You have drainage (other than a small amount of blood on the dressing) from the site.  The site is bleeding, and the bleeding does not stop after 30 minutes of holding steady pressure on the site.  Your leg or foot becomes pale, cool, tingly, or numb. This information is not intended to replace advice given to you by your health care provider. Make sure you discuss any questions you have with your health care provider. Document Released:  07/19/2014 Document Revised: 04/22/2016 Document Reviewed: 06/04/2014 Elsevier Interactive Patient Education  [employer].

## 2018-03-20 NOTE — Interval H&P Note (Signed)
History and Physical Interval Note:  03/20/2018 7:32 AM  Oscar Brown  has presented today for surgery, with the diagnosis of aflutter  The various methods of treatment have been discussed with the patient and family. After consideration of risks, benefits and other options for treatment, the patient has consented to  Procedure(s): A-FLUTTER ABLATION (N/A) as a surgical intervention .  The patient's history has been reviewed, patient examined, no change in status, stable for surgery.  I have reviewed the patient's chart and labs.  Questions were answered to the patient's satisfaction.     [name]

## 2018-03-20 NOTE — Interval H&P Note (Signed)
History and Physical Interval Note:  03/20/2018 7:33 AM  Oscar Brown  has presented today for surgery, with the diagnosis of aflutter  The various methods of treatment have been discussed with the patient and family. After consideration of risks, benefits and other options for treatment, the patient has consented to  Procedure(s): A-FLUTTER ABLATION (N/A) as a surgical intervention .  The patient's history has been reviewed, patient examined, no change in status, stable for surgery.  I have reviewed the patient's chart and labs.  Questions were answered to the patient's satisfaction.     [name]

## 2018-03-20 NOTE — Progress Notes (Addendum)
Site area: Right groin a 7 and 8 french X2 venous sheath was removed  Site Prior to Removal:  Level 0  Pressure Applied For 30 MINUTES    Bedrest Beginning at 1030am  Manual:   Yes.    Patient Status During Pull:  stable  Post Pull Groin Site:  Level 0  Post Pull Instructions Given:  Yes.    Post Pull Pulses Present:  Yes.    Dressing Applied:  Yes.    Comments:  VS remain stable

## 2018-03-21 ENCOUNTER — Encounter (HOSPITAL_COMMUNITY): Payer: Self-pay | Admitting: Internal Medicine

## 2018-03-21 MED FILL — Midazolam HCl Inj 5 MG/5ML (Base Equivalent): INTRAMUSCULAR | Qty: 5 | Status: AC

## 2018-03-28 ENCOUNTER — Encounter: Payer: Self-pay | Admitting: Internal Medicine

## 2018-04-06 ENCOUNTER — Ambulatory Visit (INDEPENDENT_AMBULATORY_CARE_PROVIDER_SITE_OTHER): Payer: Managed Care, Other (non HMO) | Admitting: Pharmacist Clinician (PhC)/ Clinical Pharmacy Specialist

## 2018-04-06 DIAGNOSIS — I4892 Unspecified atrial flutter: Secondary | ICD-10-CM | POA: Diagnosis not present

## 2018-04-06 DIAGNOSIS — Z954 Presence of other heart-valve replacement: Secondary | ICD-10-CM | POA: Diagnosis not present

## 2018-04-06 DIAGNOSIS — Z7901 Long term (current) use of anticoagulants: Secondary | ICD-10-CM | POA: Diagnosis not present

## 2018-04-06 LAB — POCT INR: INR: 4.1

## 2018-04-06 NOTE — Patient Instructions (Signed)
Description   No warfarin today Thursday May 9, then continue with 2 tablets every Monday and 1.5 tablets all other days of the week.  Repeat INR in 3 weeks

## 2018-04-17 ENCOUNTER — Ambulatory Visit (INDEPENDENT_AMBULATORY_CARE_PROVIDER_SITE_OTHER): Payer: Managed Care, Other (non HMO) | Admitting: Internal Medicine

## 2018-04-17 ENCOUNTER — Encounter: Payer: Self-pay | Admitting: Internal Medicine

## 2018-04-17 VITALS — BP 134/88 | HR 72 | Ht 68.0 in | Wt 244.0 lb

## 2018-04-17 DIAGNOSIS — Z954 Presence of other heart-valve replacement: Secondary | ICD-10-CM

## 2018-04-17 DIAGNOSIS — I4892 Unspecified atrial flutter: Secondary | ICD-10-CM

## 2018-04-17 NOTE — Patient Instructions (Addendum)
Medication Instructions:  Your physician recommends that you continue on your current medications as directed. Please refer to the Current Medication list given to you today.  Labwork: None ordered.  Testing/Procedures: None ordered.  Follow-Up: Your physician wants you to follow-up in: as needed with Dr. Taylor.      Any Other Special Instructions Will Be Listed Below (If Applicable).  If you need a refill on your cardiac medications before your next appointment, please call your pharmacy.   

## 2018-04-17 NOTE — Progress Notes (Signed)
HPI Oscar Brown returns today for evaluation of atrial flutter. He underwent EP study and catheter ablation of typical atrial flutter several weeks ago. He feels better. He denies chest pain or sob. No syncope. He is trying to do better with his diet. He denies peripheral edema. No palpitations. Allergies  Allergen Reactions  . Iohexol Hives, Itching and Other (See Comments)    Patient needs 13 hour prep per Dr. Pascal Lux, after cath he broke out in hives and had itching      Current Outpatient Medications  Medication Sig Dispense Refill  . aspirin (ASPIR-81) 81 MG EC tablet Take 1 tablet (81 mg total) by mouth daily. Swallow whole. 30 tablet 12  . furosemide (LASIX) 40 MG tablet One tab twice a week. (Patient taking differently: Take 40 mg by mouth See admin instructions. Take 40 mg by mouth twice weekly on Monday and Friday) 10 tablet 6  . Tetrahydrozoline-Zn Sulfate (ALLERGY RELIEF EYE DROPS OP) Place 1 drop into both eyes daily as needed (for allergies).    . warfarin (COUMADIN) 5 MG tablet TAKE ONE AND ONE-HALF (1  1/2) TABLET TO  TWO TABLETS BY MOUTH DAILY AS DIRECTED BY COUMADIN CLINIC (Patient taking differently: Take 10 mg by mouth daily on Monday and Friday. Take 7.5 mg by mouth daily on all other days.) 120 tablet 0   No current facility-administered medications for this visit.      Past Medical History:  Diagnosis Date  . Acute diastolic congestive heart failure ([place]) 09/21/2015   takes Lasix daily  . Acute renal failure ([facility])    ATN s/p cardiac arrest  . 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  Complicated by chronic left lower extremity edema and occasional recurrent left lower extremity cellulitis   . DVT (deep venous thrombosis) ([place]) early 80's   left leg  . Incidental lung nodule, less than or equal to 61mm 11/10/2015   3 mm nodule right lung  . Lymphedema early 39's  . Mitral regurgitation 10/06/2012  . Obstructive sleep apnea  12/14/2006   AHI 19.2/hr, desaturation to 75% on Polysomnography 02/26/2005.  Uses 11 cm H2O nocturnal nasal CPAP with good symptom control.   . 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   Past Surgical History:  Procedure Laterality Date  . A-FLUTTER ABLATION N/A 03/20/2018   Procedure: A-FLUTTER ABLATION;  Surgeon: Evans Lance, MD;  Location: [facility];  Service: Cardiovascular;  Laterality: N/A;  . CARDIAC CATHETERIZATION N/A 11/03/2015   Procedure: Right/Left Heart Cath and Coronary Angiography;  Surgeon: Leonie Man, MD;  Location: [facility];  Service: Cardiovascular;  Laterality: N/A;  . CARDIOVERSION N/A 12/23/2017   Procedure: CARDIOVERSION;  Surgeon: Thayer Headings, MD;  Location: [facility];  Service: Cardiovascular;  Laterality: N/A;  . MITRAL VALVE REPLACEMENT Right 11/13/2015   Procedure: MINIMALLY INVASIVE MITRAL VALVE (MV) REPLACEMENT;  Surgeon: [name], MD;  Location: [place];  Service: Open Heart Surgery;  Laterality: Right;  . MULTIPLE EXTRACTIONS WITH ALVEOLOPLASTY N/A 11/07/2015   Procedure: Extraction of tooth #'s 1,2,8,9,12,15,16,17, 32 with alveoloplasty and gross debridement of remaining teeth.;  Surgeon: [name], DDS;  Location: [place];  Service: Oral Surgery;  Laterality: N/A;  .  TEE WITHOUT CARDIOVERSION N/A 10/16/2015   Procedure: TRANSESOPHAGEAL ECHOCARDIOGRAM (TEE);  Surgeon: Skeet Latch, MD;  Location: [place];  Service: Cardiovascular;  Laterality: N/A;  . TEE WITHOUT CARDIOVERSION N/A 11/13/2015   Procedure: TRANSESOPHAGEAL ECHOCARDIOGRAM (TEE);  Surgeon: [name], MD;  Location: [place];  Service: Open Heart Surgery;  Laterality: N/A;     No family history on file.   Social History    Socioeconomic History  . Marital status: Legally Separated    Spouse name: Not on file  . Number of children: 3  . Years of education: Not on file  . Highest education level: Not on file  Occupational History  . Not on file  Social Needs  . Financial resource strain: Not on file  . Food insecurity:    Worry: Not on file    Inability: Not on file  . Transportation needs:    Medical: Not on file    Non-medical: Not on file  Tobacco Use  . Smoking status: Former Smoker    Packs/day: 0.10    Years: 9.00    Pack years: 0.90    Types: Cigarettes    Last attempt to quit: 09/05/2012    Years since quitting: 5.6  . Smokeless tobacco: Never Used  Substance and Sexual Activity  . Alcohol use: No    Alcohol/week: 0.0 oz  . Drug use: No  . Sexual activity: Yes  Lifestyle  . Physical activity:    Days per week: Not on file    Minutes per session: Not on file  . Stress: Not on file  Relationships  . Social connections:    Talks on phone: Not on file    Gets together: Not on file    Attends religious service: Not on file    Active member of club or organization: Not on file    Attends meetings of clubs or organizations: Not on file    Relationship status: Not on file  . Intimate partner violence:    Fear of current or ex partner: Not on file    Emotionally abused: Not on file    Physically abused: Not on file    Forced sexual activity: Not on file  Other Topics Concern  . Not on file  Social History Narrative  . Not on file     BP 134/88   Pulse 72   Ht 5\' 8"  (1.727 m)   Wt 244 lb (110.7 kg)   BMI 37.10 kg/m   Physical Exam:  Well appearing 52 yo man, NAD HEENT: Unremarkable Neck:  6 cm JVD, no thyromegally Lymphatics:  No adenopathy Back:  No CVA tenderness Lungs:  Clear with no wheezes HEART:  Regular rate rhythm, no murmurs, no rubs, no clicks, mechanical S1. Abd:  soft, positive bowel sounds, no organomegally, no rebound, no guarding Ext:  2 plus pulses,  no edema, no cyanosis, no clubbing Skin:  No rashes no nodules Neuro:  CN II through XII intact, motor grossly intact  EKG - NSR with LVH  Assess/Plan: 1. Atrial flutter - he is doing well after EP study and ablation. He appears to be maintaining NSR. 2. Mechanical MV - he is back to NSR but cannot stop his systemic anti-coagulation due to his mechanic MV. 3. HTN - his blood pressure today is reasonably well controlled. I have discussed the importance of salt reduction.  Mikle Bosworth.D.

## 2018-04-26 ENCOUNTER — Encounter (HOSPITAL_COMMUNITY): Payer: Self-pay | Admitting: Internal Medicine

## 2018-04-27 ENCOUNTER — Ambulatory Visit (INDEPENDENT_AMBULATORY_CARE_PROVIDER_SITE_OTHER): Payer: Managed Care, Other (non HMO) | Admitting: Pharmacist Clinician (PhC)/ Clinical Pharmacy Specialist

## 2018-04-27 DIAGNOSIS — Z7901 Long term (current) use of anticoagulants: Secondary | ICD-10-CM

## 2018-04-27 DIAGNOSIS — I4892 Unspecified atrial flutter: Secondary | ICD-10-CM | POA: Diagnosis not present

## 2018-04-27 DIAGNOSIS — Z954 Presence of other heart-valve replacement: Secondary | ICD-10-CM | POA: Diagnosis not present

## 2018-04-27 LAB — POCT INR: INR: 3.3 — AB (ref 2.0–3.0)

## 2018-04-27 NOTE — Patient Instructions (Signed)
Description   Continue with 2 tablets every Monday and 1.5 tablets all other days of the week.  Repeat INR in 4 weeks

## 2018-05-04 ENCOUNTER — Other Ambulatory Visit: Payer: Self-pay | Admitting: Cardiovascular Disease

## 2018-05-29 ENCOUNTER — Ambulatory Visit (INDEPENDENT_AMBULATORY_CARE_PROVIDER_SITE_OTHER): Payer: Managed Care, Other (non HMO) | Admitting: Pharmacist

## 2018-05-29 DIAGNOSIS — Z7901 Long term (current) use of anticoagulants: Secondary | ICD-10-CM

## 2018-05-29 DIAGNOSIS — Z954 Presence of other heart-valve replacement: Secondary | ICD-10-CM

## 2018-05-29 DIAGNOSIS — I4892 Unspecified atrial flutter: Secondary | ICD-10-CM

## 2018-05-29 LAB — POCT INR: INR: 1.9 — AB (ref 2.0–3.0)

## 2018-06-15 DIAGNOSIS — I252 Old myocardial infarction: Secondary | ICD-10-CM | POA: Insufficient documentation

## 2018-06-15 DIAGNOSIS — Z952 Presence of prosthetic heart valve: Secondary | ICD-10-CM | POA: Insufficient documentation

## 2018-06-15 DIAGNOSIS — I890 Lymphedema, not elsewhere classified: Secondary | ICD-10-CM | POA: Insufficient documentation

## 2018-06-19 ENCOUNTER — Ambulatory Visit (INDEPENDENT_AMBULATORY_CARE_PROVIDER_SITE_OTHER): Payer: Managed Care, Other (non HMO) | Admitting: Pharmacist Clinician (PhC)/ Clinical Pharmacy Specialist

## 2018-06-19 DIAGNOSIS — Z7901 Long term (current) use of anticoagulants: Secondary | ICD-10-CM

## 2018-06-19 DIAGNOSIS — Z954 Presence of other heart-valve replacement: Secondary | ICD-10-CM

## 2018-06-19 DIAGNOSIS — I4892 Unspecified atrial flutter: Secondary | ICD-10-CM

## 2018-06-19 LAB — POCT INR: INR: 2.8 (ref 2.0–3.0)

## 2018-06-29 ENCOUNTER — Ambulatory Visit (HOSPITAL_COMMUNITY)
Admission: RE | Admit: 2018-06-29 | Discharge: 2018-06-29 | Disposition: A | Discharge status: Home / Self Care | Payer: Managed Care, Other (non HMO) | Source: Ambulatory Visit | Attending: Internal Medicine | Admitting: Internal Medicine

## 2018-06-29 VITALS — BP 127/85 | HR 65 | Wt 244.0 lb

## 2018-06-29 DIAGNOSIS — Z954 Presence of other heart-valve replacement: Secondary | ICD-10-CM

## 2018-06-29 DIAGNOSIS — I2510 Atherosclerotic heart disease of native coronary artery without angina pectoris: Secondary | ICD-10-CM | POA: Diagnosis not present

## 2018-06-29 DIAGNOSIS — Z7901 Long term (current) use of anticoagulants: Secondary | ICD-10-CM | POA: Diagnosis not present

## 2018-06-29 DIAGNOSIS — I484 Atypical atrial flutter: Secondary | ICD-10-CM | POA: Insufficient documentation

## 2018-06-29 DIAGNOSIS — I5022 Chronic systolic (congestive) heart failure: Secondary | ICD-10-CM | POA: Diagnosis not present

## 2018-06-29 DIAGNOSIS — I890 Lymphedema, not elsewhere classified: Secondary | ICD-10-CM | POA: Diagnosis not present

## 2018-06-29 DIAGNOSIS — G4733 Obstructive sleep apnea (adult) (pediatric): Secondary | ICD-10-CM | POA: Insufficient documentation

## 2018-06-29 DIAGNOSIS — I340 Nonrheumatic mitral (valve) insufficiency: Secondary | ICD-10-CM | POA: Insufficient documentation

## 2018-06-29 DIAGNOSIS — Z86718 Personal history of other venous thrombosis and embolism: Secondary | ICD-10-CM | POA: Insufficient documentation

## 2018-06-29 DIAGNOSIS — Z6837 Body mass index (BMI) 37.0-37.9, adult: Secondary | ICD-10-CM | POA: Diagnosis not present

## 2018-06-29 DIAGNOSIS — Z7982 Long term (current) use of aspirin: Secondary | ICD-10-CM | POA: Insufficient documentation

## 2018-06-29 DIAGNOSIS — Z8674 Personal history of sudden cardiac arrest: Secondary | ICD-10-CM | POA: Diagnosis not present

## 2018-06-29 DIAGNOSIS — R001 Bradycardia, unspecified: Secondary | ICD-10-CM | POA: Diagnosis not present

## 2018-06-29 DIAGNOSIS — Z952 Presence of prosthetic heart valve: Secondary | ICD-10-CM | POA: Diagnosis not present

## 2018-06-29 DIAGNOSIS — Z79899 Other long term (current) drug therapy: Secondary | ICD-10-CM | POA: Insufficient documentation

## 2018-06-29 DIAGNOSIS — E669 Obesity, unspecified: Secondary | ICD-10-CM | POA: Insufficient documentation

## 2018-06-29 DIAGNOSIS — I110 Hypertensive heart disease with heart failure: Secondary | ICD-10-CM | POA: Diagnosis not present

## 2018-06-29 DIAGNOSIS — Z87891 Personal history of nicotine dependence: Secondary | ICD-10-CM | POA: Insufficient documentation

## 2018-06-29 DIAGNOSIS — I4892 Unspecified atrial flutter: Secondary | ICD-10-CM | POA: Diagnosis not present

## 2018-06-29 DIAGNOSIS — I5032 Chronic diastolic (congestive) heart failure: Secondary | ICD-10-CM | POA: Diagnosis not present

## 2018-06-29 NOTE — Patient Instructions (Signed)
No changes to medication at this time.  EKG today.  Follow up 6 months with Dr. Haroldine Laws. We will call you closer to this time, or you may call our office to schedule 1 month before you are due to be seen. Take all medication as prescribed the day of your appointment. Bring all medications with you to your appointment.  Do the following things EVERYDAY: 1) Weigh yourself in the morning before breakfast. Write it down and keep it in a log. 2) Take your medicines as prescribed 3) Eat low salt foods-Limit salt (sodium) to 2000 mg per day.  4) Stay as active as you can everyday 5) Limit all fluids for the day to less than 2 liters

## 2018-06-29 NOTE — Progress Notes (Signed)
Patient ID: Oscar Brown, male   DOB: 02/27/1966, 52 y.o.   MRN: [id number]     Advanced Heart Failure Clinic Note     Cardiologist: Dr Haroldine Laws  PCP: [name]  HPI: Oscar Brown is a 52 y.o. male  with OSA and mitral regurgitation. 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 On POD #3  he had a sudden VF arrest with nearly an hour of resuscitation time. Follow-up transthoracic and transesophageal echocardiograms performed after the arrest revealed normal function of the mechanical prosthetic valve with severe dysfunction involving the inferior wall of the left ventricle EF35-40%. Cardiac enzymes went quite high and he had some bradycardia and AV block, all suggestive of possible inferior wall myocardial infarction. However, left ventricular function completely normalized prior to hospital discharge. Plans for diagnostic cardiac catheterization were postponed because the patient developed acute non-oliguric renal failure with creatinine up to 7.Because the patient's rhythm and left ventricular systolic function recovered completely, it was felt that the patient did not need defibrillator implantation. The patient was ultimately discharged from the hospital on the 18th postoperative day.   Underwent successful RFA of AFL 03/20/18 with Dr. Lovena Le   He presents today for regular follow up. Feels great. Still working 3 jobs. Denies palpitations, SOB or edema. Remains on coumadin. No bleeding. Using SBE prophylaxis   EKG Sinus brady 58 No significant ST-T abnormalities. Personally reviewed   Echo 02/20/18 EF ~50% inferior HK RV mild to moderately HK. Septal flattening.    Echo 2/17 EF 45-50% inferior HK. MV ok   Review of systems complete and found to be negative unless listed in HPI.    SH:  Social History   Socioeconomic History   . Marital status: Legally Separated    Spouse name: Not on file  . Number of children: 3  . Years of education: Not on file  . Highest education level: Not on file  Occupational History  . Not on file  Social Needs  . Financial resource strain: Not on file  . Food insecurity:    Worry: Not on file    Inability: Not on file  . Transportation needs:    Medical: Not on file    Non-medical: Not on file  Tobacco Use  . Smoking status: Former Smoker    Packs/day: 0.10    Years: 9.00    Pack years: 0.90    Types: Cigarettes    Last attempt to quit: 09/05/2012    Years since quitting: 5.8  . Smokeless tobacco: Never Used  Substance and Sexual Activity  . Alcohol use: No    Alcohol/week: 0.0 oz  . Drug use: No  . Sexual activity: Yes  Lifestyle  . Physical activity:    Days per week: Not on file    Minutes per session: Not on file  . Stress: Not on file  Relationships  . Social connections:    Talks on phone: Not on file    Gets together: Not on file    Attends religious service: Not on file    Active member of club or organization: Not on file    Attends meetings of clubs or organizations: Not on file    Relationship status: Not on file  . Intimate partner violence:    Fear of current or ex partner: Not on file    Emotionally abused: Not on file  Physically abused: Not on file    Forced sexual activity: Not on file  Other Topics Concern  . Not on file  Social History Narrative  . Not on file   FH:  - Denies history of sudden cardiac death.  Past Medical History:  Diagnosis Date  . Acute diastolic congestive heart failure ([place]) 09/21/2015   takes Lasix daily  . Acute renal failure (HCC)    ATN s/p cardiac arrest  . 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  Complicated by chronic left lower extremity edema and occasional recurrent left lower extremity cellulitis   . DVT (deep venous thrombosis) ([place]) early 80's   left leg  .  Incidental lung nodule, less than or equal to 88mm 11/10/2015   3 mm nodule right lung  . Lymphedema early 6's  . Mitral regurgitation 10/06/2012  . Obstructive sleep apnea 12/14/2006   AHI 19.2/hr, desaturation to 75% on Polysomnography 02/26/2005.  Uses 11 cm H2O nocturnal nasal CPAP with good symptom control.   . S/P minimally invasive mitral valve replacement with metallic valve 32/35/5732   33 mm Carbomedics Optiform bileaflet mechanical prosthesis placed via right mini-thoracotomy approach    Current Outpatient Medications  Medication Sig Dispense Refill  . aspirin (ASPIR-81) 81 MG EC tablet Take 1 tablet (81 mg total) by mouth daily. Swallow whole. 30 tablet 12  . furosemide (LASIX) 40 MG tablet One tab twice a week. (Patient taking differently: Take 40 mg by mouth See admin instructions. Take 40 mg by mouth twice weekly on Monday and Friday) 10 tablet 6  . rosuvastatin (CRESTOR) 20 MG tablet Take 20 mg by mouth daily.    Bethann Humble Sulfate (ALLERGY RELIEF EYE DROPS OP) Place 1 drop into both eyes daily as needed (for allergies).    . warfarin (COUMADIN) 5 MG tablet TAKE ONE AND ONE-HALF (1  1/2) TABLET TO  TWO TABLETS BY MOUTH DAILY AS DIRECTED BY COUMADIN CLINIC 120 tablet 0   No current facility-administered medications for this encounter.     Vitals:   06/29/18 1118  BP: 127/85  Pulse: 65  SpO2: 99%  Weight: 244 lb (110.7 kg)   Wt Readings from Last 3 Encounters:  06/29/18 244 lb (110.7 kg)  04/17/18 244 lb (110.7 kg)  03/20/18 240 lb (108.9 kg)    PHYSICAL EXAM: General:  Well appearing. No resp difficulty HEENT: normal Neck: supple. no JVD. Carotids 2+ bilat; no bruits. No lymphadenopathy or thryomegaly appreciated. Cor: PMI nondisplaced. Regular rate & rhythm. No rubs, gallops or murmurs. Mechanical S1, crisp. Lungs: clear Abdomen: soft, nontender, nondistended. No hepatosplenomegaly. No bruits or masses. Good bowel sounds. Extremities: no cyanosis,  clubbing, rash, edema Neuro: alert & orientedx3, cranial nerves grossly intact. moves all 4 extremities w/o difficulty. Affect pleasant   ASSESSMENT & PLAN:  1. Recurrent A flutter, atypical - s/p DCCV 12/23/17. - s/p Aflutter ablation 03/20/18  - Remains in NSR  By ECG today. (Personally reviewed) - On coumadin for MVR. No bleeding 2. Mitral regurgitation s/p mechanical MVR - Continue coumadin.  - Stable on Echo 02/20/18  - INR has been stable.  - Has resultant RV strain due longstanding MV disease - Reinforced need for SBE prophylaxis 3. Prolonged in-hospital post-cardiac arrest - VT/VF - No change to current plan.   4. Lymphedema in left leg - Stable.  - Continue compression hose. 5. HTN - Meds as above.  6. CAD - cath 12/16 distal LCx 45% -  no s/s ischemia continue ASA/statin 7. Obesity - Body mass index is 37.1 kg/m.  - Encouraged weight loss and exercise.   Glori Bickers, MD  12:14 PM

## 2018-07-01 ENCOUNTER — Other Ambulatory Visit: Payer: Self-pay | Admitting: Cardiovascular Disease

## 2018-07-14 ENCOUNTER — Encounter: Payer: Self-pay | Admitting: Nurse Practitioner

## 2018-07-17 ENCOUNTER — Ambulatory Visit (INDEPENDENT_AMBULATORY_CARE_PROVIDER_SITE_OTHER): Payer: Managed Care, Other (non HMO) | Admitting: Pharmacist Clinician (PhC)/ Clinical Pharmacy Specialist

## 2018-07-17 DIAGNOSIS — Z954 Presence of other heart-valve replacement: Secondary | ICD-10-CM

## 2018-07-17 DIAGNOSIS — Z7901 Long term (current) use of anticoagulants: Secondary | ICD-10-CM | POA: Diagnosis not present

## 2018-07-17 DIAGNOSIS — I4892 Unspecified atrial flutter: Secondary | ICD-10-CM | POA: Diagnosis not present

## 2018-07-17 LAB — POCT INR: INR: 3.9 — AB (ref 2.0–3.0)

## 2018-07-17 NOTE — Patient Instructions (Signed)
Description   Take 1 tablet today Monday August 19, then continue with 2 tablets every Monday and 1.5 tablets all other days of the week.  Repeat INR in 3 weeks

## 2018-08-10 ENCOUNTER — Ambulatory Visit: Payer: Self-pay | Admitting: Nurse Practitioner

## 2018-08-14 ENCOUNTER — Ambulatory Visit (INDEPENDENT_AMBULATORY_CARE_PROVIDER_SITE_OTHER): Payer: Managed Care, Other (non HMO) | Admitting: Pharmacist

## 2018-08-14 ENCOUNTER — Telehealth: Payer: Self-pay

## 2018-08-14 DIAGNOSIS — I4892 Unspecified atrial flutter: Secondary | ICD-10-CM | POA: Diagnosis not present

## 2018-08-14 DIAGNOSIS — Z954 Presence of other heart-valve replacement: Secondary | ICD-10-CM | POA: Diagnosis not present

## 2018-08-14 DIAGNOSIS — Z7901 Long term (current) use of anticoagulants: Secondary | ICD-10-CM

## 2018-08-14 LAB — PROTIME-INR
INR: 8 — AB (ref 0.8–1.2)
Prothrombin Time: 74.9 s — ABNORMAL HIGH (ref 9.1–12.0)

## 2018-08-14 LAB — POCT INR: INR: 7.6 — AB (ref 2.0–3.0)

## 2018-08-14 NOTE — Telephone Encounter (Signed)
Received call from [place] with [employer] calling to report INR 8.0 PT 74.9.Advised I will message to coumadin clinic.

## 2018-08-14 NOTE — Telephone Encounter (Signed)
Thanks for update. See coumadin clinic note for details.

## 2018-08-17 ENCOUNTER — Ambulatory Visit (INDEPENDENT_AMBULATORY_CARE_PROVIDER_SITE_OTHER): Payer: Managed Care, Other (non HMO) | Admitting: Physician Assistant

## 2018-08-17 ENCOUNTER — Encounter: Payer: Self-pay | Admitting: Physician Assistant

## 2018-08-17 ENCOUNTER — Telehealth: Payer: Self-pay | Admitting: Emergency Medicine

## 2018-08-17 VITALS — BP 120/84 | HR 68 | Ht 67.0 in | Wt 239.4 lb

## 2018-08-17 DIAGNOSIS — Z800 Family history of malignant neoplasm of digestive organs: Secondary | ICD-10-CM

## 2018-08-17 DIAGNOSIS — Z1211 Encounter for screening for malignant neoplasm of colon: Secondary | ICD-10-CM

## 2018-08-17 DIAGNOSIS — Z7901 Long term (current) use of anticoagulants: Secondary | ICD-10-CM

## 2018-08-17 MED ORDER — NA SULFATE-K SULFATE-MG SULF 17.5-3.13-1.6 GM/177ML PO SOLN: 1.0000 | ORAL | 0 refills | Status: DC

## 2018-08-17 NOTE — Progress Notes (Addendum)
Chief Complaint: Screening for colorectal cancer in a patient on chronic anticoagulation with Coumadin  HPI:    Oscar Brown is a very pleasant 52 year old AA male with a past medical history as listed below including DVT and acute diastolic congestive heart failure and A flutter as well as mechanical mitral valve on Coumadin (02/20/2018 echo with LVEF 55%), who was referred to me by Marton Redwood, MD to consider a screening colonoscopy.      Today, patient tells me that he is doing well.  Explains that his mother had a form of cancer in which she needed a colostomy bag and tells me that he was unable to have his screening colonoscopy at 25 because he was undergoing heart surgery.  Describes mechanical valve placement in 2016 and being placed on Coumadin.  No acute heart events recently.  Feeling well.  Denies any GI complaints.    Denies fever, chills, weight loss, anorexia, nausea, vomiting, heartburn, reflux, change in bowel habits or abdominal pain.  Past Medical History:  Diagnosis Date  . Acute diastolic congestive heart failure ([place]) 09/21/2015   takes Lasix daily  . Acute renal failure (HCC)    ATN s/p cardiac arrest  . 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  Complicated by chronic left lower extremity edema and occasional recurrent left lower extremity cellulitis   . DVT (deep venous thrombosis) ([place]) early 80's   left leg  . Incidental lung nodule, less than or equal to 35mm 11/10/2015   3 mm nodule right lung  . Lymphedema early 49's  . Mitral regurgitation 10/06/2012  . Obstructive sleep apnea 12/14/2006   AHI 19.2/hr, desaturation to 75% on Polysomnography 02/26/2005.  Uses 11 cm H2O nocturnal nasal CPAP with good symptom control.   . S/P minimally invasive mitral valve replacement with metallic valve 78/29/5621   33 mm Carbomedics Optiform bileaflet mechanical prosthesis placed via right mini-thoracotomy approach    Past Surgical History:    Procedure Laterality Date  . A-FLUTTER ABLATION N/A 03/20/2018   Procedure: A-FLUTTER ABLATION;  Surgeon: Evans Lance, MD;  Location: [place] [facility];  Service: Cardiovascular;  Laterality: N/A;  . CARDIAC CATHETERIZATION N/A 11/03/2015   Procedure: Right/Left Heart Cath and Coronary Angiography;  Surgeon: Leonie Man, MD;  Location: [place] [facility];  Service: Cardiovascular;  Laterality: N/A;  . CARDIOVERSION N/A 12/23/2017   Procedure: CARDIOVERSION;  Surgeon: Thayer Headings, MD;  Location: [facility] ENDOSCOPY;  Service: Cardiovascular;  Laterality: N/A;  . MITRAL VALVE REPLACEMENT Right 11/13/2015   Procedure: MINIMALLY INVASIVE MITRAL VALVE (MV) REPLACEMENT;  Surgeon: [name], MD;  Location: [place];  Service: Open Heart Surgery;  Laterality: Right;  . MULTIPLE EXTRACTIONS WITH ALVEOLOPLASTY N/A 11/07/2015   Procedure: Extraction of tooth #'s 1,2,8,9,12,15,16,17, 32 with alveoloplasty and gross debridement of remaining teeth.;  Surgeon: [name], DDS;  Location: [place];  Service: Oral Surgery;  Laterality: N/A;  . TEE WITHOUT CARDIOVERSION N/A 10/16/2015   Procedure: TRANSESOPHAGEAL ECHOCARDIOGRAM (TEE);  Surgeon: Skeet Latch, MD;  Location: [place];  Service: Cardiovascular;  Laterality: N/A;  . TEE WITHOUT CARDIOVERSION N/A 11/13/2015   Procedure: TRANSESOPHAGEAL ECHOCARDIOGRAM (TEE);  Surgeon: [name], MD;  Location: [place];  Service: Open Heart Surgery;  Laterality: N/A;    Current Outpatient Medications  Medication Sig Dispense Refill  . aspirin (ASPIR-81) 81 MG EC tablet Take 1 tablet (81 mg total) by mouth daily. Swallow whole. 30 tablet 12  .  furosemide (LASIX) 40 MG tablet One tab twice a week. (Patient taking differently: Take 40 mg by mouth See admin instructions. Take 40 mg by mouth twice weekly on Monday and Friday) 10 tablet 6  . rosuvastatin (CRESTOR) 20 MG tablet Take 20 mg by mouth daily.    Bethann Humble Sulfate (ALLERGY RELIEF  EYE DROPS OP) Place 1 drop into both eyes daily as needed (for allergies).    . warfarin (COUMADIN) 5 MG tablet TAKE ONE AND ONE-HALF (1  1/2) TABLET TO  TWO TABLETS BY MOUTH DAILY AS DIRECTED BY COUMADIN CLINIC 120 tablet 0   No current facility-administered medications for this visit.     Allergies as of 08/17/2018 - Review Complete 06/29/2018  Allergen Reaction Noted  . Iohexol Hives, Itching, and Other (See Comments) 11/06/2015    No family history on file.  Social History   Socioeconomic History  . Marital status: Legally Separated    Spouse name: Not on file  . Number of children: 3  . Years of education: Not on file  . Highest education level: Not on file  Occupational History  . Not on file  Social Needs  . Financial resource strain: Not on file  . Food insecurity:    Worry: Not on file    Inability: Not on file  . Transportation needs:    Medical: Not on file    Non-medical: Not on file  Tobacco Use  . Smoking status: Former Smoker    Packs/day: 0.10    Years: 9.00    Pack years: 0.90    Types: Cigarettes    Last attempt to quit: 09/05/2012    Years since quitting: 5.9  . Smokeless tobacco: Never Used  Substance and Sexual Activity  . Alcohol use: No    Alcohol/week: 0.0 standard drinks  . Drug use: No  . Sexual activity: Yes  Lifestyle  . Physical activity:    Days per week: Not on file    Minutes per session: Not on file  . Stress: Not on file  Relationships  . Social connections:    Talks on phone: Not on file    Gets together: Not on file    Attends religious service: Not on file    Active member of club or organization: Not on file    Attends meetings of clubs or organizations: Not on file    Relationship status: Not on file  . Intimate partner violence:    Fear of current or ex partner: Not on file    Emotionally abused: Not on file    Physically abused: Not on file    Forced sexual activity: Not on file  Other Topics Concern  . Not on  file  Social History Narrative  . Not on file    Review of Systems:    Constitutional: No weight loss, fever or chills Skin: No rash  Cardiovascular: No chest pain Respiratory: No SOB  Gastrointestinal: See HPI and otherwise negative Genitourinary: No dysuria  Neurological: No headache, dizziness or syncope Musculoskeletal: No new muscle or joint pain Hematologic: No bleeding  Psychiatric: No history of depression or anxiety   Physical Exam:  Vital signs: BP 120/84 (BP Location: Left Arm, Patient Position: Sitting, Cuff Size: Normal)   Pulse 68   Ht 5\' 7"  (1.702 m) Comment: height measured without shoes  Wt 239 lb 6 oz (108.6 kg)   BMI 37.49 kg/m   Constitutional:   Pleasant AA male appears to be in NAD,  Well developed, Well nourished, alert and cooperative Head:  Normocephalic and atraumatic. Eyes:   PEERL, EOMI. No icterus. Conjunctiva pink. Ears:  Normal auditory acuity. Neck:  Supple Throat: Oral cavity and pharynx without inflammation, swelling or lesion.  Respiratory: Respirations even and unlabored. Lungs clear to auscultation bilaterally.   No wheezes, crackles, or rhonchi.  Cardiovascular: Normal S1, S2. No MRG. Regular rate and rhythm. No peripheral edema, cyanosis or pallor.  Gastrointestinal:  Soft, nondistended, nontender. No rebound or guarding. Normal bowel sounds. No appreciable masses or hepatomegaly. Rectal:  Not performed.  Msk:  Symmetrical without gross deformities. Without edema, no deformity or joint abnormality.  Neurologic:  Alert and  oriented x4;  grossly normal neurologically.  Skin:   Dry and intact without significant lesions or rashes. Psychiatric: Demonstrates good judgement and reason without abnormal affect or behaviors.  MOST RECENT LABS AND IMAGING: CBC    Component Value Date/Time   WBC 3.8 03/13/2018 1055   WBC 5.0 02/20/2018 1300   RBC 5.15 03/13/2018 1055   RBC 4.97 02/20/2018 1300   HGB 15.0 03/13/2018 1055   HCT 43.7  03/13/2018 1055   PLT 186 03/13/2018 1055   MCV 85 03/13/2018 1055   MCH 29.1 03/13/2018 1055   MCH 29.0 02/20/2018 1300   MCHC 34.3 03/13/2018 1055   MCHC 33.0 02/20/2018 1300   RDW 14.0 03/13/2018 1055   LYMPHSABS 1.4 03/13/2018 1055   MONOABS 0.8 05/30/2015 0524   EOSABS 0.1 03/13/2018 1055   BASOSABS 0.0 03/13/2018 1055    CMP     Component Value Date/Time   NA 140 03/13/2018 1055   K 4.1 03/13/2018 1055   CL 104 03/13/2018 1055   CO2 20 03/13/2018 1055   GLUCOSE 86 03/13/2018 1055   GLUCOSE 96 02/20/2018 1300   BUN 16 03/13/2018 1055   CREATININE 1.03 03/13/2018 1055   CREATININE 1.13 01/05/2016 1403   CALCIUM 9.4 03/13/2018 1055   PROT 7.1 12/16/2015 1220   ALBUMIN 3.2 (L) 12/16/2015 1220   AST 16 12/16/2015 1220   ALT 11 (L) 12/16/2015 1220   ALKPHOS 93 12/16/2015 1220   BILITOT 0.1 (L) 12/16/2015 1220   GFRNONAA 84 03/13/2018 1055   GFRAA 97 03/13/2018 1055    Assessment: 1.  Screening for colorectal cancer: Patient has never had a screening for colorectal cancer 2.  Likely family history of colon cancer in his mother: Patient was never told by his mother that she had colon cancer but does recall being told by an aunt that she had cancer and recalls that she had a colostomy bag, this was in her 60s/70s 3.  Chronic anticoagulation: For mechanical mitral valve and A flutter  Plan: 1.  Discussed with patient likely he will need to go on Lovenox to bridge his Coumadin therapy.  Would recommend that he hold his Coumadin for 5 days.  We will discuss the need for bridging with Lovenox with his prescribing physician and ensure that this is acceptable for him. 2.  Scheduled for a colonoscopy with Dr. Henrene Pastor as he had availability and supervising physician did not.  Did discuss risk, benefits, limitations and alternatives and the patient agrees to proceed.  Patient will follow with Dr. Henrene Pastor after time of procedure as he is new to our clinic. 3.  Patient to follow in clinic  per recommendations from Dr. Henrene Pastor after time of procedure.  Ellouise Newer, PA-C [facility] 08/17/2018, 8:57 AM  Cc: Marton Redwood, MD   Addendum: Oscar Brown with  Dr. Henrene Pastor re this patient. He would prefer he was assigned to the supervising physician this morning. Will go ahead and cancel scheduled colonoscopy and patient will be notified when the November schedule opens for Dr. Havery Moros and will be scheduled for colonoscopy then. He will then follow with Dr. Havery Moros who will be his primary GI physician.   Ellouise Newer, PA-C

## 2018-08-17 NOTE — Patient Instructions (Signed)

## 2018-08-17 NOTE — Telephone Encounter (Signed)
Des, I cancelled the colon the pt has been notified

## 2018-08-17 NOTE — Telephone Encounter (Signed)
-----   Message from Levin Erp, [place] sent at 08/17/2018 12:15 PM EDT ----- Regarding: Needs Colonoscopy changed Per Dr. Henrene Pastor needs colonoscopy changed. Please tell patient we will have to wait for November schedule to come out and he will be scheduled with Dr. Havery Moros, the supervising physician today at that time. Please cancel scheduled colo. Thanks-JLL

## 2018-08-17 NOTE — Telephone Encounter (Signed)
Thanks for the heads up on date change.  We will coordinate this with patient.  Please let me know as soon as you have the date set.

## 2018-08-17 NOTE — Progress Notes (Signed)
Reviewed. PCP coumadin clinic to manage anticoagulation.

## 2018-08-17 NOTE — Telephone Encounter (Signed)
Oscar Brown,   This patient has an appointment with the Coumadin Clinic tomorrow. We are scheduling him for a colonoscopy on 08/31/18. Please advise patient on when to hold coumadin and if he needs Lovenox bridging!    Thank you,    Tinnie Gens, [place]

## 2018-08-18 ENCOUNTER — Ambulatory Visit (INDEPENDENT_AMBULATORY_CARE_PROVIDER_SITE_OTHER): Payer: Managed Care, Other (non HMO) | Admitting: Pharmacist Clinician (PhC)/ Clinical Pharmacy Specialist

## 2018-08-18 DIAGNOSIS — I4892 Unspecified atrial flutter: Secondary | ICD-10-CM

## 2018-08-18 DIAGNOSIS — Z7901 Long term (current) use of anticoagulants: Secondary | ICD-10-CM

## 2018-08-18 DIAGNOSIS — Z954 Presence of other heart-valve replacement: Secondary | ICD-10-CM | POA: Diagnosis not present

## 2018-08-18 LAB — POCT INR: INR: 1.8 — AB (ref 2.0–3.0)

## 2018-08-31 ENCOUNTER — Encounter: Payer: Self-pay | Admitting: Internal Medicine

## 2018-09-01 ENCOUNTER — Ambulatory Visit (INDEPENDENT_AMBULATORY_CARE_PROVIDER_SITE_OTHER): Payer: Managed Care, Other (non HMO) | Admitting: Pharmacist

## 2018-09-01 DIAGNOSIS — Z954 Presence of other heart-valve replacement: Secondary | ICD-10-CM | POA: Diagnosis not present

## 2018-09-01 DIAGNOSIS — I4892 Unspecified atrial flutter: Secondary | ICD-10-CM

## 2018-09-01 DIAGNOSIS — Z7901 Long term (current) use of anticoagulants: Secondary | ICD-10-CM | POA: Diagnosis not present

## 2018-09-01 LAB — POCT INR: INR: 2 (ref 2.0–3.0)

## 2018-09-03 ENCOUNTER — Other Ambulatory Visit: Payer: Self-pay | Admitting: Internal Medicine

## 2018-09-20 ENCOUNTER — Other Ambulatory Visit (HOSPITAL_COMMUNITY): Payer: Self-pay | Admitting: Student

## 2018-09-29 ENCOUNTER — Ambulatory Visit (INDEPENDENT_AMBULATORY_CARE_PROVIDER_SITE_OTHER): Payer: Managed Care, Other (non HMO) | Admitting: Pharmacist Clinician (PhC)/ Clinical Pharmacy Specialist

## 2018-09-29 DIAGNOSIS — I4892 Unspecified atrial flutter: Secondary | ICD-10-CM

## 2018-09-29 DIAGNOSIS — Z7901 Long term (current) use of anticoagulants: Secondary | ICD-10-CM | POA: Diagnosis not present

## 2018-09-29 DIAGNOSIS — Z954 Presence of other heart-valve replacement: Secondary | ICD-10-CM

## 2018-09-29 LAB — POCT INR: INR: 3 (ref 2.0–3.0)

## 2018-10-22 ENCOUNTER — Other Ambulatory Visit (HOSPITAL_COMMUNITY): Payer: Self-pay | Admitting: Student

## 2018-10-30 ENCOUNTER — Ambulatory Visit (INDEPENDENT_AMBULATORY_CARE_PROVIDER_SITE_OTHER): Payer: Managed Care, Other (non HMO) | Admitting: Pharmacist Clinician (PhC)/ Clinical Pharmacy Specialist

## 2018-10-30 DIAGNOSIS — Z7901 Long term (current) use of anticoagulants: Secondary | ICD-10-CM | POA: Diagnosis not present

## 2018-10-30 DIAGNOSIS — Z954 Presence of other heart-valve replacement: Secondary | ICD-10-CM

## 2018-10-30 DIAGNOSIS — I4892 Unspecified atrial flutter: Secondary | ICD-10-CM

## 2018-10-30 LAB — POCT INR: INR: 3.6 — AB (ref 2.0–3.0)

## 2018-10-31 ENCOUNTER — Other Ambulatory Visit: Payer: Self-pay | Admitting: Internal Medicine

## 2018-11-01 ENCOUNTER — Inpatient Hospital Stay (HOSPITAL_COMMUNITY)
Admission: EM | Admit: 2018-11-01 | Discharge: 2018-11-07 | DRG: 314 | Disposition: A | Discharge status: Home Health | Payer: Managed Care, Other (non HMO) | Source: Ambulatory Visit | Attending: Family Medicine | Admitting: Family Medicine

## 2018-11-01 ENCOUNTER — Emergency Department (HOSPITAL_COMMUNITY): Payer: Managed Care, Other (non HMO)

## 2018-11-01 ENCOUNTER — Encounter (HOSPITAL_COMMUNITY): Payer: Self-pay

## 2018-11-01 ENCOUNTER — Other Ambulatory Visit: Payer: Self-pay

## 2018-11-01 DIAGNOSIS — J449 Chronic obstructive pulmonary disease, unspecified: Secondary | ICD-10-CM | POA: Diagnosis present

## 2018-11-01 DIAGNOSIS — Z7982 Long term (current) use of aspirin: Secondary | ICD-10-CM

## 2018-11-01 DIAGNOSIS — R55 Syncope and collapse: Secondary | ICD-10-CM | POA: Diagnosis present

## 2018-11-01 DIAGNOSIS — I4892 Unspecified atrial flutter: Secondary | ICD-10-CM | POA: Diagnosis present

## 2018-11-01 DIAGNOSIS — I890 Lymphedema, not elsewhere classified: Secondary | ICD-10-CM | POA: Diagnosis present

## 2018-11-01 DIAGNOSIS — L0390 Cellulitis, unspecified: Secondary | ICD-10-CM | POA: Diagnosis not present

## 2018-11-01 DIAGNOSIS — I10 Essential (primary) hypertension: Secondary | ICD-10-CM | POA: Diagnosis not present

## 2018-11-01 DIAGNOSIS — I4891 Unspecified atrial fibrillation: Secondary | ICD-10-CM | POA: Diagnosis present

## 2018-11-01 DIAGNOSIS — Z87891 Personal history of nicotine dependence: Secondary | ICD-10-CM | POA: Diagnosis not present

## 2018-11-01 DIAGNOSIS — G4733 Obstructive sleep apnea (adult) (pediatric): Secondary | ICD-10-CM | POA: Diagnosis present

## 2018-11-01 DIAGNOSIS — Z91041 Radiographic dye allergy status: Secondary | ICD-10-CM | POA: Diagnosis not present

## 2018-11-01 DIAGNOSIS — Y831 Surgical operation with implant of artificial internal device as the cause of abnormal reaction of the patient, or of later complication, without mention of misadventure at the time of the procedure: Secondary | ICD-10-CM | POA: Diagnosis present

## 2018-11-01 DIAGNOSIS — Z6839 Body mass index (BMI) 39.0-39.9, adult: Secondary | ICD-10-CM

## 2018-11-01 DIAGNOSIS — I361 Nonrheumatic tricuspid (valve) insufficiency: Secondary | ICD-10-CM | POA: Diagnosis not present

## 2018-11-01 DIAGNOSIS — I330 Acute and subacute infective endocarditis: Secondary | ICD-10-CM

## 2018-11-01 DIAGNOSIS — E669 Obesity, unspecified: Secondary | ICD-10-CM | POA: Diagnosis present

## 2018-11-01 DIAGNOSIS — T826XXA Infection and inflammatory reaction due to cardiac valve prosthesis, initial encounter: Principal | ICD-10-CM | POA: Diagnosis present

## 2018-11-01 DIAGNOSIS — E785 Hyperlipidemia, unspecified: Secondary | ICD-10-CM | POA: Diagnosis present

## 2018-11-01 DIAGNOSIS — Z952 Presence of prosthetic heart valve: Secondary | ICD-10-CM

## 2018-11-01 DIAGNOSIS — I5042 Chronic combined systolic (congestive) and diastolic (congestive) heart failure: Secondary | ICD-10-CM | POA: Diagnosis present

## 2018-11-01 DIAGNOSIS — B954 Other streptococcus as the cause of diseases classified elsewhere: Secondary | ICD-10-CM | POA: Diagnosis not present

## 2018-11-01 DIAGNOSIS — Z809 Family history of malignant neoplasm, unspecified: Secondary | ICD-10-CM | POA: Diagnosis not present

## 2018-11-01 DIAGNOSIS — A409 Streptococcal sepsis, unspecified: Secondary | ICD-10-CM | POA: Diagnosis present

## 2018-11-01 DIAGNOSIS — A419 Sepsis, unspecified organism: Secondary | ICD-10-CM | POA: Diagnosis present

## 2018-11-01 DIAGNOSIS — M7989 Other specified soft tissue disorders: Secondary | ICD-10-CM | POA: Diagnosis not present

## 2018-11-01 DIAGNOSIS — I110 Hypertensive heart disease with heart failure: Secondary | ICD-10-CM | POA: Diagnosis present

## 2018-11-01 DIAGNOSIS — I099 Rheumatic heart disease, unspecified: Secondary | ICD-10-CM | POA: Diagnosis not present

## 2018-11-01 DIAGNOSIS — Z888 Allergy status to other drugs, medicaments and biological substances status: Secondary | ICD-10-CM

## 2018-11-01 DIAGNOSIS — Z833 Family history of diabetes mellitus: Secondary | ICD-10-CM | POA: Diagnosis not present

## 2018-11-01 DIAGNOSIS — Z86718 Personal history of other venous thrombosis and embolism: Secondary | ICD-10-CM | POA: Diagnosis not present

## 2018-11-01 DIAGNOSIS — I825Y2 Chronic embolism and thrombosis of unspecified deep veins of left proximal lower extremity: Secondary | ICD-10-CM | POA: Diagnosis present

## 2018-11-01 DIAGNOSIS — Z9989 Dependence on other enabling machines and devices: Secondary | ICD-10-CM

## 2018-11-01 DIAGNOSIS — I350 Nonrheumatic aortic (valve) stenosis: Secondary | ICD-10-CM | POA: Diagnosis not present

## 2018-11-01 DIAGNOSIS — I5032 Chronic diastolic (congestive) heart failure: Secondary | ICD-10-CM | POA: Diagnosis not present

## 2018-11-01 DIAGNOSIS — I2510 Atherosclerotic heart disease of native coronary artery without angina pectoris: Secondary | ICD-10-CM | POA: Diagnosis present

## 2018-11-01 DIAGNOSIS — Z7901 Long term (current) use of anticoagulants: Secondary | ICD-10-CM | POA: Diagnosis not present

## 2018-11-01 DIAGNOSIS — R7881 Bacteremia: Secondary | ICD-10-CM | POA: Diagnosis not present

## 2018-11-01 DIAGNOSIS — Z23 Encounter for immunization: Secondary | ICD-10-CM

## 2018-11-01 DIAGNOSIS — L03116 Cellulitis of left lower limb: Secondary | ICD-10-CM | POA: Diagnosis present

## 2018-11-01 LAB — COMPREHENSIVE METABOLIC PANEL
ALT: 31 U/L (ref 0–44)
AST: 54 U/L — ABNORMAL HIGH (ref 15–41)
Albumin: 4 g/dL (ref 3.5–5.0)
Alkaline Phosphatase: 74 U/L (ref 38–126)
Anion gap: 14 (ref 5–15)
BUN: 10 mg/dL (ref 6–20)
CHLORIDE: 97 mmol/L — AB (ref 98–111)
CO2: 23 mmol/L (ref 22–32)
CREATININE: 1.2 mg/dL (ref 0.61–1.24)
Calcium: 9.5 mg/dL (ref 8.9–10.3)
GFR calc Af Amer: >60 mL/min (ref >60)
GFR calc non Af Amer: >60 mL/min (ref >60)
Glucose, Bld: 106 mg/dL — ABNORMAL HIGH (ref 70–99)
Potassium: 4.5 mmol/L (ref 3.5–5.1)
Sodium: 134 mmol/L — ABNORMAL LOW (ref 135–145)
Total Bilirubin: 1.5 mg/dL — ABNORMAL HIGH (ref 0.3–1.2)
Total Protein: 8.7 g/dL — ABNORMAL HIGH (ref 6.5–8.1)

## 2018-11-01 LAB — CBC WITH DIFFERENTIAL/PLATELET
Abs Immature Granulocytes: 0.07 10*3/uL (ref 0.00–0.07)
Basophils Absolute: 0 10*3/uL (ref 0.0–0.1)
Basophils Relative: 0 %
Eosinophils Absolute: 0 10*3/uL (ref 0.0–0.5)
Eosinophils Relative: 0 %
HCT: 51.6 % (ref 39.0–52.0)
Hemoglobin: 15.9 g/dL (ref 13.0–17.0)
Immature Granulocytes: 1 %
Lymphocytes Relative: 5 %
Lymphs Abs: 0.7 10*3/uL (ref 0.7–4.0)
MCH: 27.7 pg (ref 26.0–34.0)
MCHC: 30.8 g/dL (ref 30.0–36.0)
MCV: 89.9 fL (ref 80.0–100.0)
Monocytes Absolute: 0.4 10*3/uL (ref 0.1–1.0)
Monocytes Relative: 3 %
Neutro Abs: 13.4 10*3/uL — ABNORMAL HIGH (ref 1.7–7.7)
Neutrophils Relative %: 91 %
Platelets: 212 10*3/uL (ref 150–400)
RBC: 5.74 MIL/uL (ref 4.22–5.81)
RDW: 13.7 % (ref 11.5–15.5)
WBC: 14.6 10*3/uL — ABNORMAL HIGH (ref 4.0–10.5)
nRBC: 0 % (ref 0.0–0.2)

## 2018-11-01 LAB — PROTIME-INR
INR: 2.27
Prothrombin Time: 24.8 seconds — ABNORMAL HIGH (ref 11.4–15.2)

## 2018-11-01 LAB — LACTIC ACID, PLASMA
Lactic Acid, Venous: 1.2 mmol/L (ref 0.5–1.9)
Lactic Acid, Venous: 1.5 mmol/L (ref 0.5–1.9)

## 2018-11-01 LAB — I-STAT CG4 LACTIC ACID, ED: Lactic Acid, Venous: 2.28 mmol/L (ref 0.5–1.9)

## 2018-11-01 LAB — APTT: aPTT: 37 seconds — ABNORMAL HIGH (ref 24–36)

## 2018-11-01 MED ORDER — WARFARIN - PHARMACIST DOSING INPATIENT
Freq: Every day | Status: DC
  Administered 2018-11-01: 19:00:00

## 2018-11-01 MED ORDER — ASPIRIN 81 MG PO CHEW
81.0000 mg | CHEWABLE_TABLET | Freq: Every day | ORAL | Status: DC
  Administered 2018-11-01 – 2018-11-07 (×7): 81 mg via ORAL
  Filled 2018-11-01 (×7): qty 1

## 2018-11-01 MED ORDER — SODIUM CHLORIDE 0.9 % IV SOLN
2.0000 g | INTRAVENOUS | Status: DC
  Filled 2018-11-01: qty 20

## 2018-11-01 MED ORDER — SODIUM CHLORIDE 0.9 % IV BOLUS (SEPSIS)
2750.0000 mL | Freq: Once | INTRAVENOUS | Status: AC
  Administered 2018-11-01: 2750 mL via INTRAVENOUS

## 2018-11-01 MED ORDER — SODIUM CHLORIDE 0.45 % IV BOLUS
500.0000 mL | Freq: Once | INTRAVENOUS | Status: AC
  Administered 2018-11-01: 500 mL via INTRAVENOUS

## 2018-11-01 MED ORDER — ACETAMINOPHEN 650 MG RE SUPP: 650.0000 mg | Freq: Four times a day (QID) | RECTAL | Status: DC | PRN

## 2018-11-01 MED ORDER — SODIUM CHLORIDE 0.9 % IV SOLN
2.0000 g | Freq: Once | INTRAVENOUS | Status: AC
  Administered 2018-11-01: 2 g via INTRAVENOUS
  Filled 2018-11-01: qty 2

## 2018-11-01 MED ORDER — PNEUMOCOCCAL VAC POLYVALENT 25 MCG/0.5ML IJ INJ
0.5000 mL | INJECTION | INTRAMUSCULAR | Status: AC
  Administered 2018-11-02: 0.5 mL via INTRAMUSCULAR
  Filled 2018-11-01: qty 0.5

## 2018-11-01 MED ORDER — WARFARIN SODIUM 10 MG PO TABS
10.0000 mg | ORAL_TABLET | ORAL | Status: AC
  Administered 2018-11-01: 10 mg via ORAL
  Filled 2018-11-01: qty 1

## 2018-11-01 MED ORDER — POLYETHYLENE GLYCOL 3350 17 G PO PACK: 17.0000 g | PACK | Freq: Every day | ORAL | Status: DC | PRN

## 2018-11-01 MED ORDER — SODIUM CHLORIDE 0.9 % IV SOLN
2.0000 g | Freq: Two times a day (BID) | INTRAVENOUS | Status: DC
  Administered 2018-11-02: 2 g via INTRAVENOUS
  Filled 2018-11-01 (×2): qty 2

## 2018-11-01 MED ORDER — VANCOMYCIN HCL 10 G IV SOLR
2000.0000 mg | Freq: Once | INTRAVENOUS | Status: AC
  Administered 2018-11-01: 2000 mg via INTRAVENOUS
  Filled 2018-11-01: qty 2000

## 2018-11-01 MED ORDER — ACETAMINOPHEN 325 MG PO TABS
650.0000 mg | ORAL_TABLET | Freq: Four times a day (QID) | ORAL | Status: DC | PRN
  Administered 2018-11-01: 650 mg via ORAL
  Filled 2018-11-01: qty 2

## 2018-11-01 MED ORDER — ACETAMINOPHEN 325 MG PO TABS: 650.0000 mg | ORAL_TABLET | Freq: Four times a day (QID) | ORAL | Status: DC | PRN

## 2018-11-01 MED ORDER — SODIUM CHLORIDE 0.9 % IV SOLN: INTRAVENOUS | Status: DC | PRN

## 2018-11-01 MED ORDER — ROSUVASTATIN CALCIUM 20 MG PO TABS
20.0000 mg | ORAL_TABLET | Freq: Every day | ORAL | Status: DC
  Administered 2018-11-01 – 2018-11-06 (×6): 20 mg via ORAL
  Filled 2018-11-01 (×6): qty 1

## 2018-11-01 MED ORDER — SODIUM CHLORIDE 0.45 % IV BOLUS
1000.0000 mL | Freq: Once | INTRAVENOUS | Status: AC
  Administered 2018-11-01: 1000 mL via INTRAVENOUS

## 2018-11-01 MED ORDER — VANCOMYCIN HCL IN DEXTROSE 750-5 MG/150ML-% IV SOLN: 750.0000 mg | Freq: Two times a day (BID) | INTRAVENOUS | Status: DC

## 2018-11-01 NOTE — ED Provider Notes (Signed)
[facility] EMERGENCY DEPARTMENT Provider Note   CSN: [id number] Arrival date & time: 11/01/18  1520     History   Chief Complaint Chief Complaint  Patient presents with  . Loss of Consciousness    HPI Oscar Brown is a 52 y.o. male.  Patient with 5 day history of bilateral cellulitis presenting to ED febrile at 104F to ED after coming from PCP office while having two episodes of syncope. Patient also has history of CHF, metal mitral valve replacement with anticoagulation with warfarin. Patient has been treated with doxycyline for 5 days for cellulitis.      Past Medical History:  Diagnosis Date  . Acute diastolic congestive heart failure ([place]) 09/21/2015   takes Lasix daily  . Acute renal failure (HCC)    ATN s/p cardiac arrest  . Atrial fibrillation ([place])   . 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  Complicated by chronic left lower extremity edema and occasional recurrent left lower extremity cellulitis   . COPD (chronic obstructive pulmonary disease) ([place])   . DVT (deep venous thrombosis) ([place]) early 80's   left leg  . HLD (hyperlipidemia)   . Incidental lung nodule, less than or equal to 110m 11/10/2015   3 mm nodule right lung  . Lymphedema early 822's . Mitral regurgitation 10/06/2012  . Obstructive sleep apnea 12/14/2006   AHI 19.2/hr, desaturation to 75% on Polysomnography 02/26/2005.  Uses 11 cm H2O nocturnal nasal CPAP with good symptom control.   . S/P minimally invasive mitral valve replacement with metallic valve 112/24/8250  33 mm Carbomedics Optiform bileaflet mechanical prosthesis placed via right mini-thoracotomy approach    Patient Active Problem List   Diagnosis Date Noted  . Coronary artery disease involving native coronary artery of native heart without angina pectoris 12/23/2016  . Long term (current) use of anticoagulants [Z79.01] 12/05/2015  . Atrial flutter ([place]   . History of cardiac  arrest   . Incidental lung nodule, less than or equal to 369m12/10/2015  . Tobacco use disorder 06/05/2015  . Severe mitral regurgitation 10/06/2012  . Preventative health care 10/06/2012  . Obesity, Class I, BMI 30-34.9 12/14/2006  . Obstructive sleep apnea 12/14/2006  . Chronic venous insufficiency 12/14/2006    Past Surgical History:  Procedure Laterality Date  . A-FLUTTER ABLATION N/A 03/20/2018   Procedure: A-FLUTTER ABLATION;  Surgeon: TaEvans LanceMD;  [facility];  Service: Cardiovascular;  Laterality: N/A;  . CARDIAC CATHETERIZATION N/A 11/03/2015   Procedure: Right/Left Heart Cath and Coronary Angiography;  Surgeon: DaLeonie ManMD;  [facility];  Service: Cardiovascular;  Laterality: N/A;  . CARDIOVERSION N/A 12/23/2017   Procedure: CARDIOVERSION;  Surgeon: NaThayer HeadingsMD;  [facility];  Service: Cardiovascular;  Laterality: N/A;  . MITRAL VALVE REPLACEMENT Right 11/13/2015   Procedure: MINIMALLY INVASIVE MITRAL VALVE (MV) REPLACEMENT;  Surgeon: ClRexene AlbertsMD;  [facility] Service: Open Heart Surgery;  Laterality: Right;  . MULTIPLE EXTRACTIONS WITH ALVEOLOPLASTY N/A 11/07/2015   Procedure: Extraction of tooth #'s 1,2,8,9,12,15,16,17, 32 with alveoloplasty and gross debridement of remaining teeth.;  Surgeon: RoLenn CalDDS;  [facility] Service: Oral Surgery;  Laterality: N/A;  . TEE WITHOUT CARDIOVERSION N/A 10/16/2015   Procedure: TRANSESOPHAGEAL ECHOCARDIOGRAM (TEE);  Surgeon: TiSkeet LatchMD;  [facility] Service: Cardiovascular;  Laterality: N/A;  . TEE WITHOUT CARDIOVERSION N/A 11/13/2015  Procedure: TRANSESOPHAGEAL ECHOCARDIOGRAM (TEE);  Surgeon: [name], MD;  Location: [place];  Service: Open Heart Surgery;  Laterality: N/A;        Home Medications    Prior to Admission medications   Medication Sig Start Date End Date Taking? Authorizing Provider  aspirin  (ASPIR-81) 81 MG EC tablet Take 1 tablet (81 mg total) by mouth daily. Swallow whole. 12/16/15   Bensimhon, Shaune Pascal, MD  furosemide (LASIX) 40 MG tablet One tab twice a week. Patient taking differently: Take 40 mg by mouth See admin instructions. Take 40 mg by mouth twice weekly on Monday and Friday 12/05/17   Shirley Friar, PA-C  furosemide (LASIX) 40 MG tablet TAKE 1 TABLET BY MOUTH TWO TIMES A WEEK; NEED OFFICE VISIT 10/23/18   Shirley Friar, PA-C  Na Sulfate-K Sulfate-Mg Sulf 17.5-3.13-1.6 GM/177ML SOLN Take 1 kit by mouth as directed. 08/17/18   Levin Erp, PA  rosuvastatin (CRESTOR) 20 MG tablet Take 20 mg by mouth daily.    [provider]  Tetrahydrozoline-Zn Sulfate (ALLERGY RELIEF EYE DROPS OP) Place 1 drop into both eyes daily as needed (for allergies).    [provider]  warfarin (COUMADIN) 5 MG tablet TAKE 1  1/2  TO 2 TABLETS BY MOUTH DAILY  AS DIRECTED BY COUMADIN CLINIC 10/31/18   Evans Lance, MD    Family History Family History  Problem Relation Age of Onset  . Cancer Mother        type unknown but had a colostomy bag  . Other Father        bone disease  . Diabetes Paternal Aunt     Social History Social History   Tobacco Use  . Smoking status: Former Smoker    Packs/day: 0.10    Years: 9.00    Pack years: 0.90    Types: Cigarettes    Last attempt to quit: 09/05/2012    Years since quitting: 6.1  . Smokeless tobacco: Never Used  Substance Use Topics  . Alcohol use: Yes    Alcohol/week: 0.0 standard drinks    Comment: 1 beer once a week or special occasions  . Drug use: No     Allergies   Iohexol   Review of Systems Review of Systems  Constitutional: Positive for fever.  HENT: Negative for congestion.   Respiratory: Negative for cough, chest tightness and shortness of breath.   Cardiovascular: Negative for chest pain.  Gastrointestinal: Negative for abdominal distention, constipation and diarrhea.    Genitourinary: Negative for difficulty urinating and hematuria.  Neurological: Positive for dizziness, syncope and light-headedness. Negative for tremors, seizures, speech difficulty and weakness.     Physical Exam Updated Vital Signs BP (!) 102/58 (BP Location: Right Arm)   Pulse 81   Temp (S) (!) 104.7 F (40.4 C) (Rectal)   Resp 18   Ht _0  (1.702 m)   Wt 108.4 kg   SpO2 98%   BMI 37.43 kg/m   Physical Exam  Constitutional: He is oriented to person, place, and time. He appears well-developed. No distress.  HENT:  Head: Normocephalic and atraumatic.  Eyes: Pupils are equal, round, and reactive to light. EOM are normal. No scleral icterus.  Neck: No JVD present.  Cardiovascular: Normal rate and regular rhythm. Exam reveals no gallop and no friction rub.  No murmur heard. Pulmonary/Chest: Effort normal and breath sounds normal.  Abdominal: Soft. Bowel sounds are normal. He exhibits no distension.  Musculoskeletal: He exhibits  no deformity.       Right lower leg: He exhibits swelling.       Legs: Neurological: He is alert and oriented to person, place, and time. No cranial nerve deficit.  5/5 strength throughout  Skin: Capillary refill takes 2 to 3 seconds.  Psychiatric: He has a normal mood and affect. His behavior is normal.     ED Treatments / Results  Labs (all labs ordered are listed, but only abnormal results are displayed) Labs Reviewed  I-STAT CG4 LACTIC ACID, ED - Abnormal; Notable for the following components:      Result Value   Lactic Acid, Venous 2.28 (*)    All other components within normal limits  CULTURE, BLOOD (ROUTINE X 2)  CULTURE, BLOOD (ROUTINE X 2)  COMPREHENSIVE METABOLIC PANEL  CBC WITH DIFFERENTIAL/PLATELET  URINALYSIS, ROUTINE W REFLEX MICROSCOPIC  PROTIME-INR  APTT    EKG None  Radiology No results found.  Procedures Procedures (including critical care time)  Medications Ordered in ED Medications  acetaminophen (TYLENOL)  tablet 650 mg (650 mg Oral Given 11/01/18 1604)  sodium chloride 0.45 % bolus 500 mL (has no administration in time range)  vancomycin (VANCOCIN) 2,000 mg in sodium chloride 0.9 % 500 mL IVPB (has no administration in time range)  ceFEPIme (MAXIPIME) 2 g in sodium chloride 0.9 % 100 mL IVPB (has no administration in time range)     Initial Impression / Assessment and Plan / ED Course  I have reviewed the triage vital signs and the nursing notes.  Pertinent labs & imaging results that were available during my care of the patient were reviewed by me and considered in my medical decision making (see chart for details).     Febrile patient presenting with likely sepsis from bilateral cellulitis. Concern for possible infective endocarditis given syncopal episodes in setting of mechanical heart valve.  - History of CHF, will gently fluid resuscitate.  - Will initiate sepsis protocol with labs pending - After blood cultures obtained, will start Cefepime/Vanc to cover for infective endocarditis. - Plan for admission, consult placed to hospitalized team.    Final Clinical Impressions(s) / ED Diagnoses   Final diagnoses:  Sepsis without acute organ dysfunction, due to unspecified organism [facility])    ED Discharge Orders    None       Bonnita Hollow, MD 11/01/18 [place], DO 11/01/18 1721

## 2018-11-01 NOTE — ED Triage Notes (Signed)
Pt brought in by EMS due to having syncopal episode while at MD office. Pt has had fever since yesterday. Pt has hx of valve replacement. Pt denies chest pain or SOB. Pt a&ox4. Pt has cellulitis on left calf.

## 2018-11-01 NOTE — Progress Notes (Signed)
Pt arrived to 5W04 via stretcher, a/o X 4, VSS, showing no signs of distress. Skin check completed with charge nurse [place] RN, left leg cellulitis noted to be red and warm to touch, MD aware, no other skin issues noted upon admission to 5W04.  RN waiting for new orders to be place. Vital Signs: Temp: 99.2, BP: 105/64 (76), HR: 60.  RN to continue to monitor

## 2018-11-01 NOTE — Progress Notes (Signed)
Pharmacy Antibiotic Note  [facility] is a 52 y.o. male admitted on 11/01/2018 with Cellulitis and r/o IE.  Pharmacy has been consulted for Vanco/Cefepime dosing. + Coumadin dosing for MVR, afib, and h/o DVT.  CC/HPI: syncope at MD office. +Fever, cellulitis s/p treatment with doxycycline  PMH: afib, MVR, CHF,DVT, COPD, HLD, lung nodule, lymphedema, OSA,   Anticoag: MVR, afib, and h/o DVT. INR 2.27 on admit. (goal 2.5-3.5) - PTA Coumadin 10mg  MFr, 7.5mg  all other days. Admit INR 2.27. (held 12/2 for INR 3.6 and on 12/3 took 5mg )  ID: B cellulitis. Cover empirically for IE> LA 2.28,WBC 14.6,  Vanco 12/4>> Cefepime 12/4>>  Vancomycin 750 mg IV Q 12 hrs. Goal AUC 400-550. Expected AUC: 484.5 SCr used: 1.2  Plan: Vanco 2g IV x 1 then 750mg  IV q 12h. Trough at steady state. Cefepime 2g IV x 1 in ED then q 12 hrs. Coumadin 10mg  po x 1 tonight. Daily INR     Height: 5\' 7"  (170.2 cm) Weight: 239 lb (108.4 kg) IBW/kg (Calculated) : 66.1  Temp (24hrs), Avg:102.8 F (39.3 C), Min:100.3 F (37.9 C), Max:104.7 F (40.4 C)  Recent Labs  Lab 11/01/18 1551 11/01/18 1558  WBC 14.6*  --   CREATININE 1.20  --   LATICACIDVEN  --  2.28*    Estimated Creatinine Clearance: 84.5 mL/min (by C-G formula based on SCr of 1.2 mg/dL).    Allergies  Allergen Reactions  . Iohexol Hives, Itching and Other (See Comments)    Patient needs 13 hour prep per Dr. Pascal Lux, after cath he broke out in hives and had itching     Sims Laday S. Alford Highland, PharmD, BCPS Clinical Staff Pharmacist [place], [place] 11/01/2018 6:04 PM

## 2018-11-01 NOTE — H&P (Addendum)
[facility] Admission History and Physical Service Pager: [phone]  Patient name: Oscar Brown Medical record number: [id number] Date of birth: [birth year] Age: 52 y.o. Gender: male  Primary Care Provider: Marton Redwood, MD Consultants: none Code Status: full  Chief Complaint: fever, rash  Assessment and Plan: Oscar Brown is a 52 y.o. male presenting with rash on left leg, fever, and a presyncopal episode today.  PMH is significant for mitral valve replacement on warfarin, OSA, COPD, CHF.     Sepsis likely 2/2 non-puritic cellulitis of the left lower extremity- patient meets 3/4 SIRS criteria (temp, RR> 20, wbc 14.6 and 2/3 qSOFA ( RR > 22, SBP < 100). Lactic acid is 2.28.  Low pressure of 84/61.  Most recent was 93/63 with MAPs persistently 65-70.  Patient has a two day history of fever and rash on his left lower extremity. Exam and infectious picture seems most consistent with cellulitis, though it is unusual that he noticed redness in the right leg and was treated for a cellulitis on that side 1 weeks ago. Blood cultures have been drawn and patient has been started on vanc and cefepime empirically, fluids were started in the ED.  Differential diagnosis includes: cellulitis vs. Endocarditis vs DVT vs drug reaction. Of note, chart review suggests he has a history of lymphedema in the left leg for which he wears compression hose. His leg does not appear swollen compared with the right leg on admission exam. - Admit to FPTS, telemetry, attending Dr. Ardelia Mems - broad spec antibiotics: vanc and cefepime.  Narrow spectrum when patient appears more clinically stable - fluids: IVF at 145m/hr (careful fluid resuscitation with > maintenance d/t hx of significant heart disease, MVR)  - f/u Blood Cx - consider cards consult/TEE pending blood cx results - doppler u/s to r/o DVT despite patient being therapeutic on warfarin - this is an odd story for cellulitis because  it was bilateral and infection developed while he was on abx - cardiac monitoring - am cbc and bmp - trend LA to normal - monitor cellulitis margins  Mitral regurgitation s/p Mitral valve replacement/systolic heart failure - Patient follows with heart failure clinic. 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 In the post-op period he experienced a prolonged VF arrest and subsequently was noted to have EF 35-45% which subsequently improved. ECHO on 01/2018 shows EF 55%, D shaped interventricular septum suggestive of RV overload, mechanical mitral valve. Takes furosemide 434mtwice a week on Monday and Friday.   - holding furosemide -monitor fluid status closely, strict I/O.  on warfarin at home. INR on admission 2.27.  Was 3.6 on 12/2.  - continue warfarin.  Pharmacy to dose. - daily INR   - monitor lung exam for signs of hypervolemia while we are fluid resuscitating for sepsis management  COPD - reported history of COPD.  Patient is not taking medications for this. Lungs clear on exam, O2 saturation WNL.   OSA- patient reports wearing cpap every night.  - continue CPAP.   Atrial flutter, historical, resolved - patient had reported history of afib/flutter before his heart valve replacement but is not currently taking any rate controlling medications. On 03/20/2018 patient had successful a-flutter radiofrequency ablation treatment - on continuous cardiac monitoring.   HTN - hypotensive on admission. patient denies having HTN but it is noted in cardiology notes. Previously controlled per outpt notes - holding furosemide in setting of sepsis and fluid rehydration  CAD -  cath 12/16 distal left circumflex 45% stenosis. No chest pain on admission. Last lipid panel 09/22/2015 LDL 116, Total cholesterol 187, HDL 60. On crestor 71m at home.  - continue crestor, ASA   Obesity - Body mass index is 39.16 kg/m. Continue to encourage lifestyle  modifications.  FEN/GI: 1016mhr mIVF NS.   Prophylaxis: warfarin.    Disposition: telemetry  History of Present Illness:  Oscar Brown a 89.o. male presenting left leg erythema and warmth concerning for cellulitis meeting sepsis criteria with a presyncopal episode today.  PMH is significant for mitral valve replacement on warfarin, OSA, COPD, CHF.    About 7 days ago the patient reports that he had a rash on his right leg and fever.  He went to his doctor and was given a 10 day course of antibiotics, he believes doxycycline.  His rash on the right leg went away several days ago, but yesterday another rash appeared on the left leg and he developed fevers overnight.  He had difficulty sleeping because of the fever last night and today he went to the doctor again.  While he was there he became dizzy while taking off his compression stockings and felt like he was going to pass out. He states the nurse said his eyes 'rolled back in his head'. They gave him fluid and an ambulance took him to the ED.  He reports that he remembers the entire episode, he never lost consciousness, he never experienced a fall. He had no chest pain or dyspnea during the episode, no palpitations. He denies other infectious symptoms including cough, sore throat, rhinorrhea/congestion. No N/V/D/C. No urinary frequency, urgency or dysuria.     In the ED, the patient was noted to have a rectal temp of 104 and a LA of 2.28. He was given a fluid bolus, blood Cx were drawn, and he was started on vanc and cefepime.    Review Of Systems:   Review of Systems  Constitutional: Positive for fever.  Respiratory: Negative for cough.   Cardiovascular: Negative for chest pain.  Gastrointestinal: Negative for abdominal pain, diarrhea, nausea and vomiting.  Genitourinary: Negative for dysuria.  Skin: Positive for rash.  Neurological: Negative for headaches.    Patient Active Problem List   Diagnosis Date Noted  . Coronary  artery disease involving native coronary artery of native heart without angina pectoris 12/23/2016  . Long term (current) use of anticoagulants [Z79.01] 12/05/2015  . Atrial flutter ([place]  . History of cardiac arrest   . Incidental lung nodule, less than or equal to 60m36m2/10/2015  . Tobacco use disorder 06/05/2015  . Severe mitral regurgitation 10/06/2012  . Preventative health care 10/06/2012  . Obesity, Class I, BMI 30-34.9 12/14/2006  . Obstructive sleep apnea 12/14/2006  . Chronic venous insufficiency 12/14/2006    Past Medical History: Past Medical History:  Diagnosis Date  . Acute diastolic congestive heart failure ([place]0/23/2016   takes Lasix daily  . Acute renal failure (HCC)    ATN s/p cardiac arrest  . Atrial fibrillation ([place] . 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  Complicated by chronic left lower extremity edema and occasional recurrent left lower extremity cellulitis   . COPD (chronic obstructive pulmonary disease) ([place] . DVT (deep venous thrombosis) ([place]CC[place] Bostonarly 80's   left leg  . HLD (hyperlipidemia)   . Incidental lung nodule, less than or equal to 60mm5m/10/2015   3 mm  nodule right lung  . Lymphedema early 79's  . Mitral regurgitation 10/06/2012  . Obstructive sleep apnea 12/14/2006   AHI 19.2/hr, desaturation to 75% on Polysomnography 02/26/2005.  Uses 11 cm H2O nocturnal nasal CPAP with good symptom control.   . S/P minimally invasive mitral valve replacement with metallic valve 49/82/6415   33 mm Carbomedics Optiform bileaflet mechanical prosthesis placed via right mini-thoracotomy approach    Past Surgical History: Past Surgical History:  Procedure Laterality Date  . A-FLUTTER ABLATION N/A 03/20/2018   Procedure: A-FLUTTER ABLATION;  Surgeon: Evans Lance, MD;  Location: [place] [facility];  Service: Cardiovascular;  Laterality: N/A;  . CARDIAC CATHETERIZATION N/A 11/03/2015   Procedure: Right/Left Heart Cath  and Coronary Angiography;  Surgeon: Leonie Man, MD;  Location: [place] [facility];  Service: Cardiovascular;  Laterality: N/A;  . CARDIOVERSION N/A 12/23/2017   Procedure: CARDIOVERSION;  Surgeon: Thayer Headings, MD;  [facility];  Service: Cardiovascular;  Laterality: N/A;  . MITRAL VALVE REPLACEMENT Right 11/13/2015   Procedure: MINIMALLY INVASIVE MITRAL VALVE (MV) REPLACEMENT;  Surgeon: [name], MD;  Location: [place];  Service: Open Heart Surgery;  Laterality: Right;  . MULTIPLE EXTRACTIONS WITH ALVEOLOPLASTY N/A 11/07/2015   Procedure: Extraction of tooth #'s 1,2,8,9,12,15,16,17, 32 with alveoloplasty and gross debridement of remaining teeth.;  Surgeon: [name], DDS;  Location: [place];  Service: Oral Surgery;  Laterality: N/A;  . TEE WITHOUT CARDIOVERSION N/A 10/16/2015   Procedure: TRANSESOPHAGEAL ECHOCARDIOGRAM (TEE);  Surgeon: Skeet Latch, MD;  Location: [place];  Service: Cardiovascular;  Laterality: N/A;  . TEE WITHOUT CARDIOVERSION N/A 11/13/2015   Procedure: TRANSESOPHAGEAL ECHOCARDIOGRAM (TEE);  Surgeon: [name], MD;  Location: [place];  Service: Open Heart Surgery;  Laterality: N/A;    Social History: Social History   Tobacco Use  . Smoking status: Former Smoker    Packs/day: 0.10    Years: 9.00    Pack years: 0.90    Types: Cigarettes    Last attempt to quit: 09/05/2012    Years since quitting: 6.1  . Smokeless tobacco: Never Used  Substance Use Topics  . Alcohol use: Yes    Alcohol/week: 0.0 standard drinks    Comment: 1 beer once a week or special occasions  . Drug use: No   Additional social history: lives with fiance  Please also refer to relevant sections of EMR.  Family History: Family History  Problem Relation Age of Onset  . Cancer Mother        type unknown but had a colostomy bag  . Other Father        bone disease  . Diabetes Paternal Aunt    Allergies and Medications: Allergies  Allergen Reactions   . Iohexol Hives, Itching and Other (See Comments)    Patient needs 13 hour prep per Dr. Pascal Lux, after cath he broke out in hives and had itching    No current facility-administered medications on file prior to encounter.    Current Outpatient Medications on File Prior to Encounter  Medication Sig Dispense Refill  . aspirin (ASPIR-81) 81 MG EC tablet Take 1 tablet (81 mg total) by mouth daily. Swallow whole. 30 tablet 12  . furosemide (LASIX) 40 MG tablet One tab twice a week. (Patient taking differently: Take 40 mg by mouth See admin instructions. Take 40 mg by mouth twice weekly on Monday and Friday) 10 tablet 6  . furosemide (LASIX) 40 MG tablet TAKE 1 TABLET BY MOUTH  TWO TIMES A WEEK; NEED OFFICE VISIT 10 tablet 2  . Na Sulfate-K Sulfate-Mg Sulf 17.5-3.13-1.6 GM/177ML SOLN Take 1 kit by mouth as directed. 354 mL 0  . rosuvastatin (CRESTOR) 20 MG tablet Take 20 mg by mouth daily.    Bethann Humble Sulfate (ALLERGY RELIEF EYE DROPS OP) Place 1 drop into both eyes daily as needed (for allergies).    . warfarin (COUMADIN) 5 MG tablet TAKE 1  1/2  TO 2 TABLETS BY MOUTH DAILY  AS DIRECTED BY COUMADIN CLINIC 120 tablet 0      Objective: BP (!) 102/58 (BP Location: Right Arm)   Pulse 81   Temp (S) (!) 104.7 F (40.4 C) (Rectal)   Resp 18   Ht 5' 7"  (1.702 m)   Wt 108.4 kg   SpO2 98%   BMI 37.43 kg/m  Exam: General: alert and oriented.  No acute distress.  Diaphoretic.   Eyes: EOMI.  No scleral icterus.  Cardiovascular: regular rate, rhythm.  No murmurs or rubs.   Respiratory: lungs clear to auscultation bilaterally. No wheezes or rhonchi. Comfortable work of breathing Gastrointestinal: soft, nontender. Normal bowel sounds.  EXT: left leg has erythematous rash covering majority of his left leg below the knee (see picture above). Erythema extends from medial to the left knee down the leg to above the ankle with clear margins, no drainage. No evidence of skin abrasion, no puncture  wounds. Area is warm to the touch, nontender.  Negative Homan sign. No edema Neuro: Cranial nerves grossly intact.   Psych: pleasant affect.  Good eye contact.  Normal speech.   Labs and Imaging: CBC BMET  Recent Labs  Lab 11/01/18 1551  WBC 14.6*  HGB 15.9  HCT 51.6  PLT 212   No results for input(s): NA, K, CL, CO2, BUN, CREATININE, GLUCOSE, CALCIUM in the last 168 hours.    Benay Pike, MD 11/01/2018, 4:46 PM PGY-1, [place] Intern pager: [phone], text pages welcome  I have separately seen and examined the patient. I have discussed the findings and exam with Dr. Jeannine Kitten and agree with the above note.  My changes/additions are outlined in BLUE.   Everrett Coombe, MD PGY-3 [facility]

## 2018-11-02 ENCOUNTER — Encounter (HOSPITAL_COMMUNITY): Payer: Self-pay

## 2018-11-02 DIAGNOSIS — A419 Sepsis, unspecified organism: Secondary | ICD-10-CM

## 2018-11-02 LAB — CBC
HCT: 37.2 % — ABNORMAL LOW (ref 39.0–52.0)
Hemoglobin: 11.9 g/dL — ABNORMAL LOW (ref 13.0–17.0)
MCH: 28.1 pg (ref 26.0–34.0)
MCHC: 32 g/dL (ref 30.0–36.0)
MCV: 87.9 fL (ref 80.0–100.0)
Platelets: 169 10*3/uL (ref 150–400)
RBC: 4.23 MIL/uL (ref 4.22–5.81)
RDW: 13.9 % (ref 11.5–15.5)
WBC: 12.1 10*3/uL — ABNORMAL HIGH (ref 4.0–10.5)
nRBC: 0 % (ref 0.0–0.2)

## 2018-11-02 LAB — BLOOD CULTURE ID PANEL (REFLEXED)
ACINETOBACTER BAUMANNII: NOT DETECTED
Candida albicans: NOT DETECTED
Candida glabrata: NOT DETECTED
Candida krusei: NOT DETECTED
Candida parapsilosis: NOT DETECTED
Candida tropicalis: NOT DETECTED
ENTEROBACTERIACEAE SPECIES: NOT DETECTED
Enterobacter cloacae complex: NOT DETECTED
Enterococcus species: NOT DETECTED
Escherichia coli: NOT DETECTED
Haemophilus influenzae: NOT DETECTED
Klebsiella oxytoca: NOT DETECTED
Klebsiella pneumoniae: NOT DETECTED
LISTERIA MONOCYTOGENES: NOT DETECTED
Neisseria meningitidis: NOT DETECTED
Proteus species: NOT DETECTED
Pseudomonas aeruginosa: NOT DETECTED
STREPTOCOCCUS PYOGENES: NOT DETECTED
STREPTOCOCCUS SPECIES: DETECTED — AB
Serratia marcescens: NOT DETECTED
Staphylococcus aureus (BCID): NOT DETECTED
Staphylococcus species: NOT DETECTED
Streptococcus agalactiae: DETECTED — AB
Streptococcus pneumoniae: NOT DETECTED

## 2018-11-02 LAB — BASIC METABOLIC PANEL
Anion gap: 9 (ref 5–15)
BUN: 11 mg/dL (ref 6–20)
CO2: 21 mmol/L — ABNORMAL LOW (ref 22–32)
Calcium: 8.2 mg/dL — ABNORMAL LOW (ref 8.9–10.3)
Chloride: 105 mmol/L (ref 98–111)
Creatinine, Ser: 0.88 mg/dL (ref 0.61–1.24)
GFR calc Af Amer: >60 mL/min (ref >60)
GFR calc non Af Amer: >60 mL/min (ref >60)
Glucose, Bld: 102 mg/dL — ABNORMAL HIGH (ref 70–99)
Potassium: 3.9 mmol/L (ref 3.5–5.1)
Sodium: 135 mmol/L (ref 135–145)

## 2018-11-02 LAB — PROTIME-INR
INR: 2.87
Prothrombin Time: 29.7 seconds — ABNORMAL HIGH (ref 11.4–15.2)

## 2018-11-02 LAB — HIV ANTIBODY (ROUTINE TESTING W REFLEX): HIV Screen 4th Generation wRfx: NONREACTIVE

## 2018-11-02 MED ORDER — SODIUM CHLORIDE 0.9 % IV BOLUS
500.0000 mL | Freq: Once | INTRAVENOUS | Status: AC
  Administered 2018-11-02: 500 mL via INTRAVENOUS

## 2018-11-02 MED ORDER — PENICILLIN G POTASSIUM 20000000 UNITS IJ SOLR
4.0000 10*6.[IU] | INTRAVENOUS | Status: DC
  Administered 2018-11-02 – 2018-11-07 (×29): 4 10*6.[IU] via INTRAVENOUS
  Filled 2018-11-02 (×36): qty 4

## 2018-11-02 MED ORDER — WARFARIN SODIUM 7.5 MG PO TABS
7.5000 mg | ORAL_TABLET | Freq: Once | ORAL | Status: AC
  Administered 2018-11-02: 7.5 mg via ORAL
  Filled 2018-11-02: qty 1

## 2018-11-02 MED ORDER — SODIUM CHLORIDE 0.9 % IV SOLN
INTRAVENOUS | Status: DC
  Administered 2018-11-02 – 2018-11-03 (×3): via INTRAVENOUS

## 2018-11-02 NOTE — Progress Notes (Signed)
Pt using home CPAP

## 2018-11-02 NOTE — Progress Notes (Signed)
Pt's BP noted to be 93/59 (69), Temp: 100.8. Pt currently asymptomatic showing no signs of distress. MD notified. RN to continue to monitor.

## 2018-11-02 NOTE — H&P (View-Only) (Signed)
    [facility] has been requested to perform a transesophageal echocardiogram on Oscar Brown for bacteremia.  After careful review of history and examination, the risks and benefits of transesophageal echocardiogram have been explained including risks of esophageal damage, perforation (1:10,000 risk), bleeding, pharyngeal hematoma as well as other potential complications associated with conscious sedation including aspiration, arrhythmia, respiratory failure and death. Alternatives to treatment were discussed, questions were answered. Patient is willing to proceed.   Lyda Jester, PA-C  11/02/2018 4:58 PM

## 2018-11-02 NOTE — Progress Notes (Signed)
    [facility] has been requested to perform a transesophageal echocardiogram on Decklin J Suhre for bacteremia.  After careful review of history and examination, the risks and benefits of transesophageal echocardiogram have been explained including risks of esophageal damage, perforation (1:10,000 risk), bleeding, pharyngeal hematoma as well as other potential complications associated with conscious sedation including aspiration, arrhythmia, respiratory failure and death. Alternatives to treatment were discussed, questions were answered. Patient is willing to proceed.   Lyda Jester, PA-C  11/02/2018 4:58 PM

## 2018-11-02 NOTE — Progress Notes (Signed)
Pt wearing home CPAP machine and tolerating well.

## 2018-11-02 NOTE — Progress Notes (Signed)
Paged MD at 1316 about patient BP 92/58 and HR 57 at 1254. MD indicated to recheck in 2 hours.  Paged doctor to update on patient BP of 100/63  at 1727.

## 2018-11-02 NOTE — Progress Notes (Signed)
PHARMACY - PHYSICIAN COMMUNICATION CRITICAL VALUE ALERT - BLOOD CULTURE IDENTIFICATION (BCID)  Oscar Brown is an 52 y.o. male who presented to [facility] on 11/01/2018 with a chief complaint of fever and rash.  Assessment:  Started on broad-spectrum ABX for cellulitis w/ possible sepsis, DDx inc'ing endocarditis; blood cx returns w/ 3/4 bottles growing strep agalactiae.  Name of physician (or Provider) Contacted: Dr Pilar Plate  Current antibiotics: vancomycin and cefepime  Changes to prescribed antibiotics recommended:  Resident will present the recommended narrowed regimen (penicillin G) to rounding team this am; for now will continue current regimen.  Results for orders placed or performed during the hospital encounter of 11/01/18  Blood Culture ID Panel (Reflexed) (Collected: 11/01/2018  4:10 PM)  Result Value Ref Range   Enterococcus species NOT DETECTED NOT DETECTED   Listeria monocytogenes NOT DETECTED NOT DETECTED   Staphylococcus species NOT DETECTED NOT DETECTED   Staphylococcus aureus (BCID) NOT DETECTED NOT DETECTED   Streptococcus species DETECTED (A) NOT DETECTED   Streptococcus agalactiae DETECTED (A) NOT DETECTED   Streptococcus pneumoniae NOT DETECTED NOT DETECTED   Streptococcus pyogenes NOT DETECTED NOT DETECTED   Acinetobacter baumannii NOT DETECTED NOT DETECTED   Enterobacteriaceae species NOT DETECTED NOT DETECTED   Enterobacter cloacae complex NOT DETECTED NOT DETECTED   Escherichia coli NOT DETECTED NOT DETECTED   Klebsiella oxytoca NOT DETECTED NOT DETECTED   Klebsiella pneumoniae NOT DETECTED NOT DETECTED   Proteus species NOT DETECTED NOT DETECTED   Serratia marcescens NOT DETECTED NOT DETECTED   Haemophilus influenzae NOT DETECTED NOT DETECTED   Neisseria meningitidis NOT DETECTED NOT DETECTED   Pseudomonas aeruginosa NOT DETECTED NOT DETECTED   Candida albicans NOT DETECTED NOT DETECTED   Candida glabrata NOT DETECTED NOT DETECTED   Candida krusei NOT  DETECTED NOT DETECTED   Candida parapsilosis NOT DETECTED NOT DETECTED   Candida tropicalis NOT DETECTED NOT DETECTED    Wynona Neat, PharmD, BCPS  11/02/2018  5:19 AM

## 2018-11-02 NOTE — Progress Notes (Signed)
ANTICOAGULATION CONSULT NOTE - Initial Consult  Pharmacy Consult for warfarin Indication: h/o MVR, afib and h/o DVT  Allergies  Allergen Reactions  . Iohexol Hives, Itching and Other (See Comments)    Patient needs 13 hour prep per Dr. Pascal Lux, after cath he broke out in hives and had itching     Patient Measurements: Height: 5\' 7"  (170.2 cm) Weight: 250 lb (113.4 kg) IBW/kg (Calculated) : 66.1   Vital Signs: Temp: 100.8 F (38.2 C) (12/05 0537) Temp Source: Oral (12/05 0537) BP: 93/59 (12/05 0537) Pulse Rate: 71 (12/05 0537)  Labs: Recent Labs    10/30/18 0842 11/01/18 1551 11/02/18 0524  HGB  --  15.9 11.9*  HCT  --  51.6 37.2*  PLT  --  212 169  APTT  --  37*  --   LABPROT  --  24.8* 29.7*  INR 3.6* 2.27 2.87  CREATININE  --  1.20 0.88    Estimated Creatinine Clearance: 118.1 mL/min (by C-G formula based on SCr of 0.88 mg/dL).   Medical History: Past Medical History:  Diagnosis Date  . Acute diastolic congestive heart failure ([place]) 09/21/2015   takes Lasix daily  . Acute renal failure (HCC)    ATN s/p cardiac arrest  . Atrial fibrillation ([place])   . 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  Complicated by chronic left lower extremity edema and occasional recurrent left lower extremity cellulitis   . COPD (chronic obstructive pulmonary disease) ([place])   . DVT (deep venous thrombosis) ([place]) early 80's   left leg  . HLD (hyperlipidemia)   . Incidental lung nodule, less than or equal to 88mm 11/10/2015   3 mm nodule right lung  . Lymphedema early 35's  . Mitral regurgitation 10/06/2012  . Obstructive sleep apnea 12/14/2006   AHI 19.2/hr, desaturation to 75% on Polysomnography 02/26/2005.  Uses 11 cm H2O nocturnal nasal CPAP with good symptom control.   . S/P minimally invasive mitral valve replacement with metallic valve 00/76/2263   33 mm Carbomedics Optiform bileaflet mechanical prosthesis placed via right mini-thoracotomy  approach   Assessment: 52 yo male with h/o MVR, afib, and DVT on warfarin PTA. PTA dose of warfarin is 10mg  M, Fr, and 7.5mg  all other days. Admit INR 2.27. (held 12/2 for INR 3.6 and on 12/3 took 5mg ). INR 2.87 today.  Goal of Therapy:  INR 2.5-3.5 Monitor platelets by anticoagulation protocol: Yes   Plan:  Warfarin 7.5mg  PO x 1 tonight Daily INR  Dhara Schepp A. Levada Dy, PharmD, [place] Pager: [phone] Please utilize Amion for appropriate phone number to reach the unit pharmacist ([place])    11/02/2018,8:08 AM

## 2018-11-02 NOTE — Progress Notes (Signed)
Family Medicine Teaching Service Daily Progress Note Intern Pager: [phone]  Patient name: Oscar Brown Medical record number: [id number] Date of birth: 1966/05/06 Age: 52 y.o. Gender: male  Primary Care Provider: Marton Redwood, MD Consultants: none Code Status: Full code  Pt Overview and Major Events to Date:  Hospital Day 1 Admitted: 11/01/2018   Assessment and Plan: Oscar Brown is a 52 y.o. male presenting with rash on left leg, fever, and a presyncopal episode today.  PMH is significant for mitral valve replacement on warfarin, OSA, COPD, CHF.     Sepsis likely 2/2 non-puritic cellulitis of the left lower extremity- patient developed left leg cellulitis while being treated with Abx for cellulitis of the R. Leg.  Blood Cx drawn and Broad Spec Abx started. No IVF given overnight, will give bolus this AM. Patient currently TDDUKGU(542.7), systolic BP < 062, LA has dropped to 1.5. Strep agalactiae detected on 3/4 blood Cx . On exam patient was alert, no acute distress, normal CV and pulm exams.  Patient on vanc, cefepime.  Pharm recommended switching to penicillin G 4 mil. Q4h. Will switch to that.  Will get TEE to r/o endocarditis. - cards consult/TEE   - f/u blood Cx sensitivities.  - f/u doppler u/s - cardiac monitoring -CBC, BMP - monitor cellulitis margins.    Mitral regurgitation s/p Mitral valve replacement/systolic heart failure - Patient follows with heart failure clinic. 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 In the post-op period he experienced a prolonged VF arrest and subsequently was noted to have EF 35-45% which subsequently improved. ECHO on 01/2018 shows EF 55%, D shaped interventricular septum suggestive of RV overload, mechanical mitral valve. Takes furosemide 40mg  twice a week on Monday and Friday.  I/O: 2600 input, 2000 output.   - holding furosemide -monitor fluid status closely, strict I/O.  -on warfarin at home.  INR on admission 2.27>2.87  Was 3.6 on 12/2.  - continue warfarin.  Pharmacy to dose. - daily INR   - monitor lung exam for signs of hypervolemia while we are fluid resuscitating for sepsis management  COPD - reported history of COPD.  Patient is not taking medications for this. Lungs clear on exam, O2 saturation WNL.   OSA- patient reports wearing cpap every night.  - continue CPAP.   Atrial flutter, historical, resolved - patient had reported history of afib/flutter before his heart valve replacement but is not currently taking any rate controlling medications. On 03/20/2018 patient had successful a-flutter radiofrequency ablation treatment - on continuous cardiac monitoring.   HTN - hypotensive on admission. patient denies having HTN but it is noted in cardiology notes. Previously controlled per outpt notes - holding furosemide in setting of sepsis and fluid rehydration  CAD - cath 12/16 distal left circumflex 45% stenosis. No chest pain on admission. Last lipid panel 09/22/2015 LDL 116, Total cholesterol 187, HDL 60. On crestor 20mg  at home.  - continue crestor, ASA   Obesity - Body mass index is 39.16 kg/m. Continue to encourage lifestyle modifications.   . sodium chloride    . ceFEPime (MAXIPIME) IV Stopped (11/02/18 0444)  . vancomycin     Electrolytes: replete PRN  Nutrition: regular diet GI ppx: none DVT px: warfarin  Disposition: home   Medications: Scheduled Meds: . aspirin  81 mg Oral Daily  . pneumococcal 23 valent vaccine  0.5 mL Intramuscular Tomorrow-1000  . rosuvastatin  20 mg Oral q1800  . Warfarin - Pharmacist Dosing Inpatient  Does not apply q1800   Continuous Infusions: . sodium chloride    . ceFEPime (MAXIPIME) IV Stopped (11/02/18 0444)  . vancomycin     PRN Meds: sodium chloride, acetaminophen **OR** acetaminophen, polyethylene  glycol  ================================================= ================================================= Subjective:  Patient feeling well today, much better than yesterday.  He has no nausea or vomiting.  He had a nonbloody bowl movement last night.   Objective: Temp:  [99.2 F (37.3 C)-104.7 F (40.4 C)] 100.8 F (38.2 C) (12/05 0537) Pulse Rate:  [60-87] 71 (12/05 0537) Resp:  [16-25] 18 (12/05 0537) BP: (84-111)/(44-68) 93/59 (12/05 0537) SpO2:  [93 %-100 %] 97 % (12/05 0537) Weight:  [108.4 kg-113.4 kg] 113.4 kg (12/04 2210) Intake/Output 12/04 0701 - 12/05 0700 In: 2680 [P.O.:480; IV Piggyback:2200] Out: 2025 [Urine:2025] Physical Exam:  Gen: NAD, alert, non-toxic, well-nourished, well-appearing, sitting comfortably  HEENT: Normocephaic, atraumatic. Clear conjuctiva, no scleral icterus and injection.  CV: Regular rate and rhythm. Mechanical valve sound appreciated. Radial pulses 2+ bilaterally. No bilateral lower extremity edema. Resp: Clear to auscultation bilaterally.  No wheezing, rales, abnormal lung sounds.  No increased work of breathing appreciated. Abd: Nontender and nondistended on palpation to all 4 quadrants.  Positive bowel sounds. Psych: Cooperative with exam. Pleasant. Makes eye contact.    Laboratory: Recent Labs  Lab 11/01/18 1551  WBC 14.6*  HGB 15.9  HCT 51.6  PLT 212   Recent Labs  Lab 11/01/18 1551  NA 134*  K 4.5  CL 97*  CO2 23  BUN 10  CREATININE 1.20  CALCIUM 9.5  PROT 8.7*  BILITOT 1.5*  ALKPHOS 74  ALT 31  AST 54*  GLUCOSE 106*   Imaging/Diagnostic Tests: Dg Chest Port 1 View  Result Date: 11/01/2018 CLINICAL DATA:  Syncopal episode today. Fever since yesterday. EXAM: PORTABLE CHEST 1 VIEW COMPARISON:  Chest x-ray dated 12/08/2015. FINDINGS: Heart size and mediastinal contours are stable. Valvular hardware appears grossly stable in position. Lungs are clear. No pleural effusions seen. Osseous structures about the chest are  unremarkable. IMPRESSION: No active disease.  No evidence of pneumonia. Electronically Signed   By: Franki Cabot M.D.   On: 11/01/2018 18:08      Benay Pike, MD 11/02/2018, 6:03 AM PGY-1, [place] Intern pager: [phone], text pages welcome

## 2018-11-03 ENCOUNTER — Inpatient Hospital Stay (HOSPITAL_COMMUNITY): Payer: Managed Care, Other (non HMO)

## 2018-11-03 ENCOUNTER — Encounter (HOSPITAL_COMMUNITY): Payer: Self-pay | Admitting: *Deleted

## 2018-11-03 ENCOUNTER — Encounter (HOSPITAL_COMMUNITY)
Admission: EM | Disposition: A | Discharge status: Home Health | Payer: Self-pay | Source: Home / Self Care | Attending: Family Medicine

## 2018-11-03 DIAGNOSIS — I10 Essential (primary) hypertension: Secondary | ICD-10-CM

## 2018-11-03 DIAGNOSIS — G4733 Obstructive sleep apnea (adult) (pediatric): Secondary | ICD-10-CM

## 2018-11-03 DIAGNOSIS — I361 Nonrheumatic tricuspid (valve) insufficiency: Secondary | ICD-10-CM

## 2018-11-03 DIAGNOSIS — L0390 Cellulitis, unspecified: Secondary | ICD-10-CM

## 2018-11-03 DIAGNOSIS — R7881 Bacteremia: Secondary | ICD-10-CM

## 2018-11-03 DIAGNOSIS — M7989 Other specified soft tissue disorders: Secondary | ICD-10-CM

## 2018-11-03 DIAGNOSIS — Z86718 Personal history of other venous thrombosis and embolism: Secondary | ICD-10-CM

## 2018-11-03 DIAGNOSIS — Z952 Presence of prosthetic heart valve: Secondary | ICD-10-CM

## 2018-11-03 HISTORY — PX: TEE WITHOUT CARDIOVERSION: SHX5443

## 2018-11-03 LAB — BASIC METABOLIC PANEL
Anion gap: 7 (ref 5–15)
BUN: 10 mg/dL (ref 6–20)
CO2: 23 mmol/L (ref 22–32)
CREATININE: 0.77 mg/dL (ref 0.61–1.24)
Calcium: 8.6 mg/dL — ABNORMAL LOW (ref 8.9–10.3)
Chloride: 105 mmol/L (ref 98–111)
GFR calc Af Amer: >60 mL/min (ref >60)
GFR calc non Af Amer: >60 mL/min (ref >60)
Glucose, Bld: 115 mg/dL — ABNORMAL HIGH (ref 70–99)
POTASSIUM: 4.1 mmol/L (ref 3.5–5.1)
Sodium: 135 mmol/L (ref 135–145)

## 2018-11-03 LAB — PROTIME-INR
INR: 2.44
Prothrombin Time: 26.2 seconds — ABNORMAL HIGH (ref 11.4–15.2)

## 2018-11-03 SURGERY — ECHOCARDIOGRAM, TRANSESOPHAGEAL
Anesthesia: Moderate Sedation

## 2018-11-03 MED ORDER — GENTAMICIN SULFATE 40 MG/ML IJ SOLN
3.0000 mg/kg | INTRAVENOUS | Status: DC
  Administered 2018-11-04 – 2018-11-07 (×5): 260 mg via INTRAVENOUS
  Filled 2018-11-03 (×5): qty 6.5

## 2018-11-03 MED ORDER — FENTANYL CITRATE (PF) 100 MCG/2ML IJ SOLN
INTRAMUSCULAR | Status: AC
  Filled 2018-11-03: qty 2

## 2018-11-03 MED ORDER — MIDAZOLAM HCL (PF) 10 MG/2ML IJ SOLN
INTRAMUSCULAR | Status: DC | PRN
  Administered 2018-11-03 (×5): 2 mg via INTRAVENOUS

## 2018-11-03 MED ORDER — WARFARIN SODIUM 5 MG PO TABS
10.0000 mg | ORAL_TABLET | Freq: Once | ORAL | Status: AC
  Administered 2018-11-03: 10 mg via ORAL
  Filled 2018-11-03: qty 2

## 2018-11-03 MED ORDER — FENTANYL CITRATE (PF) 100 MCG/2ML IJ SOLN
INTRAMUSCULAR | Status: DC | PRN
  Administered 2018-11-03 (×4): 25 ug via INTRAVENOUS

## 2018-11-03 MED ORDER — BUTAMBEN-TETRACAINE-BENZOCAINE 2-2-14 % EX AERO
INHALATION_SPRAY | CUTANEOUS | Status: DC | PRN
  Administered 2018-11-03: 2 via TOPICAL

## 2018-11-03 MED ORDER — MIDAZOLAM HCL (PF) 5 MG/ML IJ SOLN
INTRAMUSCULAR | Status: AC
  Filled 2018-11-03: qty 2

## 2018-11-03 MED ORDER — SODIUM CHLORIDE 0.9 % IV SOLN: INTRAVENOUS | Status: DC

## 2018-11-03 NOTE — Progress Notes (Signed)
ANTICOAGULATION CONSULT NOTE - Initial Consult  Pharmacy Consult for warfarin Indication: h/o MVR, afib and h/o DVT  Allergies  Allergen Reactions  . Iohexol Hives, Itching and Other (See Comments)    Patient needs 13 hour prep per Dr. Pascal Lux, after cath he broke out in hives and had itching     Patient Measurements: Height: 5\' 7"  (170.2 cm) Weight: 253 lb 1.4 oz (114.8 kg) IBW/kg (Calculated) : 66.1   Vital Signs: Temp: 98.1 F (36.7 C) (12/06 0510) Temp Source: Oral (12/06 0510) BP: 112/73 (12/06 0510) Pulse Rate: 52 (12/06 0510)  Labs: Recent Labs    11/01/18 1551 11/02/18 0524 11/03/18 0328  HGB 15.9 11.9*  --   HCT 51.6 37.2*  --   PLT 212 169  --   APTT 37*  --   --   LABPROT 24.8* 29.7* 26.2*  INR 2.27 2.87 2.44  CREATININE 1.20 0.88  --     Estimated Creatinine Clearance: 118.9 mL/min (by C-G formula based on SCr of 0.88 mg/dL).   Medical History: Past Medical History:  Diagnosis Date  . Acute diastolic congestive heart failure ([place]) 09/21/2015   takes Lasix daily  . Acute renal failure ([facility])    ATN s/p cardiac arrest  . Atrial fibrillation ([place])   . 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  Complicated by chronic left lower extremity edema and occasional recurrent left lower extremity cellulitis   . COPD (chronic obstructive pulmonary disease) ([place])   . DVT (deep venous thrombosis) ([place]) early 80's   left leg  . HLD (hyperlipidemia)   . Incidental lung nodule, less than or equal to 29mm 11/10/2015   3 mm nodule right lung  . Lymphedema early 36's  . Mitral regurgitation 10/06/2012  . Obstructive sleep apnea 12/14/2006   AHI 19.2/hr, desaturation to 75% on Polysomnography 02/26/2005.  Uses 11 cm H2O nocturnal nasal CPAP with good symptom control.   . S/P minimally invasive mitral valve replacement with metallic valve 47/07/6282   33 mm Carbomedics Optiform bileaflet mechanical prosthesis placed via right  mini-thoracotomy approach   Assessment: 52 yo male with h/o MVR, afib, and DVT on warfarin PTA. PTA dose of warfarin is 10mg  M, Fr, and 7.5mg  all other days. Admit INR 2.27. (held 12/2 for INR 3.6 and on 12/3 took 5mg ). INR 2.44 today.  Goal of Therapy:  INR 2.5-3.5 Monitor platelets by anticoagulation protocol: Yes   Plan:  Warfarin 10 mg PO x 1 tonight Daily INR  Autum Benfer A. Levada Dy, PharmD, [place] Pager: [phone] Please utilize Amion for appropriate phone number to reach the unit pharmacist ([place])    11/03/2018,7:43 AM

## 2018-11-03 NOTE — CV Procedure (Signed)
     Transesophageal Echocardiogram Note  AMUN STEMM [id number] 1966/04/09  Procedure: Transesophageal Echocardiogram Indications: bacteremia  Procedure Details Consent: Obtained Time Out: Verified patient identification, verified procedure, site/side was marked, verified correct patient position, special equipment/implants available, Radiology Safety Procedures followed,  medications/allergies/relevent history reviewed, required imaging and test results available.  Performed  Medications: During this procedure the patient is administered a total of Versed 10 mg and Fentanyl 100 mcg to achieve and maintain moderate conscious sedation.  The patient's heart rate, blood pressure, and oxygen saturation are monitored continuously during the procedure. The period of conscious sedation is 35 minutes, of which I was present face-to-face 100% of this time.  There is a highly mobile echodensity attached to the anterior portion of the sewing ring of the mechanical mitral valve measuring 12 x 5 mm consistent with prosthesis vegetation.  For full reading please see TEE report.    Complications: No apparent complications Patient did tolerate procedure well.  Ena Dawley, MD, [facility] 11/03/2018, 3:20 PM

## 2018-11-03 NOTE — Progress Notes (Signed)
Left lower extremity venous duplex exam completed.  Positive acute deep vein thrombosis involving left profunda vein proximal. Exam cannot completely exclude the possible left calf veins thrombosis due to poor visibility.  Please see preliminary notes on CV PROC under chart review. Fitz[place] Mikami H Zoeann Mol(RDMS RVT) 11/03/18 5:41 PM

## 2018-11-03 NOTE — Interval H&P Note (Signed)
History and Physical Interval Note:  11/03/2018 12:46 PM  Oscar Brown  has presented today for surgery, with the diagnosis of BACTEREMIA  The various methods of treatment have been discussed with the patient and family. After consideration of risks, benefits and other options for treatment, the patient has consented to  Procedure(s): TRANSESOPHAGEAL ECHOCARDIOGRAM (TEE) (N/A) as a surgical intervention .  The patient's history has been reviewed, patient examined, no change in status, stable for surgery.  I have reviewed the patient's chart and labs.  Questions were answered to the patient's satisfaction.     Ena Dawley

## 2018-11-03 NOTE — Progress Notes (Signed)
Pharmacy Antibiotic Note  Oscar Brown is a 52 y.o. male admitted on 11/01/2018 with GBS bacteremia and Prosthetic valve endocarditis. Pt is on IV penicillin, will add gentamicin for synergy. Scr 0.77, est. crcl > 100 ml/min. Gent dosing ABW = 85.6 kg Peak goal = 3-4 mg/L Trough goal < 1 mg/L  Plan: Gentamicin 260 mg IV Q 24 hrs Monitor renal function.   Height: 5\' 7"  (170.2 cm) Weight: 253 lb 1.4 oz (114.8 kg) IBW/kg (Calculated) : 66.1  Temp (24hrs), Avg:98.1 F (36.7 C), Min:97.8 F (36.6 C), Max:98.4 F (36.9 C)  Recent Labs  Lab 11/01/18 1551 11/01/18 1558 11/01/18 1825 11/01/18 2140 11/02/18 0524 11/03/18 0843  WBC 14.6*  --   --   --  12.1*  --   CREATININE 1.20  --   --   --  0.88 0.77  LATICACIDVEN  --  2.28* 1.2 1.5  --   --     Estimated Creatinine Clearance: 130.8 mL/min (by C-G formula based on SCr of 0.77 mg/dL).    Allergies  Allergen Reactions  . Iohexol Hives, Itching and Other (See Comments)    Patient needs 13 hour prep per Dr. Pascal Lux, after cath he broke out in hives and had itching     Antimicrobials this admission: Vanco 12/4>>12/5 Cefepime 12/4>>12/5 Pencillin G 12/5>> Gentamicin 12/6 >>  Dose adjustments this admission:   Microbiology results: 12/4 Blood >>Strep agalactiae in 3/4 bottles (GBS)  Thank you for allowing pharmacy to be a part of this patient's care.  Maryanna Shape, PharmD, BCPS, BCPPS Clinical Pharmacist  Pager: [phone]   11/03/2018 5:45 PM

## 2018-11-03 NOTE — Progress Notes (Signed)
Family Medicine Teaching Service Daily Progress Note Intern Pager: [phone]  Patient name: Oscar Brown Medical record number: [id number] Date of birth: [birth year] Age: 52 y.o. Gender: male  Primary Care Provider: Marton Redwood, MD Consultants: none Code Status: Full code  Pt Overview and Major Events to Date:  Hospital Day 2 Admitted: 11/01/2018   Assessment and Plan: Oscar Brown is a 52 y.o. male presenting with rash on left leg, fever, and a presyncopal episode today.  PMH is significant for mitral valve replacement on warfarin, OSA, COPD, CHF.     Sepsis likely 2/2 non-puritic cellulitis of the left lower extremity- patient developed left leg cellulitis while being treated with Abx for cellulitis of the R. Leg.  Blood Cx drawn and Broad Spec Abx started.  Patient currently afebrile, systolic BP improved, now over 100, LA has dropped to 1.5, wbc down to 12.1. Strep agalactiae detected on 3/4 blood Cx . Normal exam today and no complaints from patient.  He is feeling well.  Patient on penicillin G 66m q4h, per pharm recs.  Will get TEE to r/o endocarditis. - cards consult/TEE   - d/c fluids - f/u blood Cx sensitivities.  - cardiac monitoring -CBC, BMP - monitor cellulitis margins.    Mitral regurgitation s/p Mitral valve replacement/systolic heart failure - Patient follows with heart failure clinic. 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 In the post-op period he experienced a prolonged VF arrest and subsequently was noted to have EF 35-45% which subsequently improved. ECHO on 01/2018 shows EF 55%, D shaped interventricular septum suggestive of RV overload, mechanical mitral valve. Takes furosemide 40mg  twice a week on Monday and Friday.  I/O: 2600 input, 2000 output.   - holding furosemide -monitor fluid status closely, strict I/O.  -on warfarin at home. INR on admission 2.27>2.87>2.44  Was 3.6 on 12/2.  - continue warfarin.  Pharmacy  to dose. - daily INR   - monitor lung exam for signs of hypervolemia while we are fluid resuscitating for sepsis management  COPD - reported history of COPD.  Patient is not taking medications for this. Lungs clear on exam, O2 saturation WNL.   OSA- patient reports wearing cpap every night.  - continue CPAP.   Atrial flutter, historical, resolved - patient had reported history of afib/flutter before his heart valve replacement but is not currently taking any rate controlling medications. On 03/20/2018 patient had successful a-flutter radiofrequency ablation treatment - on continuous cardiac monitoring.   HTN - hypotensive on admission. patient denies having HTN but it is noted in cardiology notes. Previously controlled per outpt notes - holding furosemide in setting of sepsis and fluid rehydration  CAD - cath 12/16 distal left circumflex 45% stenosis. No chest pain on admission. Last lipid panel 09/22/2015 LDL 116, Total cholesterol 187, HDL 60. On crestor 20mg  at home.  - continue crestor, ASA   Obesity - Body mass index is 39.16 kg/m. Continue to encourage lifestyle modifications.   . sodium chloride 100 mL/hr at 11/03/18 0406  . pencillin G potassium IV 4 Million Units (11/03/18 0527)   Electrolytes: replete PRN  Nutrition: regular diet GI ppx: none DVT px: warfarin  Disposition: home   Medications: Scheduled Meds: . aspirin  81 mg Oral Daily  . rosuvastatin  20 mg Oral q1800  . Warfarin - Pharmacist Dosing Inpatient   Does not apply q1800   Continuous Infusions: . sodium chloride 100 mL/hr at 11/03/18 0406  . pencillin G  potassium IV 4 Million Units (11/03/18 0527)   PRN Meds: acetaminophen **OR** acetaminophen, polyethylene glycol  ================================================= ================================================= Subjective:  Patient states he feels great.  He has no complaints.   Objective: Temp:  [97.8 F (36.6 C)-98.2 F (36.8 C)]  98.1 F (36.7 C) (12/06 0510) Pulse Rate:  [52-61] 52 (12/06 0510) Resp:  [18] 18 (12/06 0510) BP: (92-112)/(58-73) 112/73 (12/06 0510) SpO2:  [98 %] 98 % (12/06 0510) Weight:  [114.8 kg] 114.8 kg (12/06 0500) Intake/Output 12/05 0701 - 12/06 0700 In: 240 [P.O.:240] Out: -  Physical Exam:  Gen: NAD, alert, non-toxic, well-nourished, well-appearing, sitting comfortably  HEENT: Normocephaic, atraumatic. Clear conjuctiva, no scleral icterus and injection.  CV: Regular rate and rhythm. Mechanical valve sound appreciated. Radial pulses 2+ bilaterally. No bilateral lower extremity edema. Resp: Clear to auscultation bilaterally.  No wheezing, rales, abnormal lung sounds.  No increased work of breathing appreciated. Abd: Nontender and nondistended on palpation to all 4 quadrants.  Positive bowel sounds. Psych: Cooperative with exam. Pleasant. Makes eye contact.    Laboratory: Recent Labs  Lab 11/01/18 1551 11/02/18 0524  WBC 14.6* 12.1*  HGB 15.9 11.9*  HCT 51.6 37.2*  PLT 212 169   Recent Labs  Lab 11/01/18 1551 11/02/18 0524  NA 134* 135  K 4.5 3.9  CL 97* 105  CO2 23 21*  BUN 10 11  CREATININE 1.20 0.88  CALCIUM 9.5 8.2*  PROT 8.7*  --   BILITOT 1.5*  --   ALKPHOS 74  --   ALT 31  --   AST 54*  --   GLUCOSE 106* 102*   Imaging/Diagnostic Tests: Dg Chest Port 1 View  Result Date: 11/01/2018 CLINICAL DATA:  Syncopal episode today. Fever since yesterday. EXAM: PORTABLE CHEST 1 VIEW COMPARISON:  Chest x-ray dated 12/08/2015. FINDINGS: Heart size and mediastinal contours are stable. Valvular hardware appears grossly stable in position. Lungs are clear. No pleural effusions seen. Osseous structures about the chest are unremarkable. IMPRESSION: No active disease.  No evidence of pneumonia. Electronically Signed   By: Franki Cabot M.D.   On: 11/01/2018 18:08      Benay Pike, MD 11/03/2018, 5:53 AM PGY-1, [place] Intern pager: [phone], text  pages welcome

## 2018-11-03 NOTE — Progress Notes (Addendum)
ID PROGRESS NOTE  Dr Linus Salmons will see formally tomorrow. This is a 52yo M with hx of rheumatic heart disease c/bsevere mitral regurgitation s/p minimally invasive mitral valve replacement using a bileaflet mechanical prosthetic valve in 11/13/2015 with [place] Carbomedics Optiform bileaflet mechanical prosthesis (size 57mm, catalog # [id number], serial # [id number])- placed by dr cub owen.The patient was recently treated for RLE cellulits but now admitted with fever of 104F, hypotension and LLE cellulitis. He was started on vanco/cefepime but narrowed to penicillin when blood cx grew group b strep bacteremia. He underwent TEE on 12/6 which showed a highly mobile echodensity attached to the anterior portion of the sewing ring of the mechanical mitral valve measuring 12 x 5 mm consistent with prosthesis vegetation.  ID consulted for further recs   - recommend to continue with high dose penicillin, which we will likely treat for 6 wks. Patient has good renal function, will initiate gentamicin for aim of 2 wks. -recommend repeat blood cx - please contact CT surgery for evaluation since it is associated with high mortality  Oscar Brown [facility] [phone]

## 2018-11-04 DIAGNOSIS — L03116 Cellulitis of left lower limb: Secondary | ICD-10-CM

## 2018-11-04 DIAGNOSIS — I099 Rheumatic heart disease, unspecified: Secondary | ICD-10-CM

## 2018-11-04 DIAGNOSIS — Z7901 Long term (current) use of anticoagulants: Secondary | ICD-10-CM

## 2018-11-04 DIAGNOSIS — I350 Nonrheumatic aortic (valve) stenosis: Secondary | ICD-10-CM

## 2018-11-04 DIAGNOSIS — I5032 Chronic diastolic (congestive) heart failure: Secondary | ICD-10-CM

## 2018-11-04 DIAGNOSIS — Z87891 Personal history of nicotine dependence: Secondary | ICD-10-CM

## 2018-11-04 LAB — CULTURE, BLOOD (ROUTINE X 2)
SPECIAL REQUESTS: ADEQUATE
Special Requests: ADEQUATE

## 2018-11-04 LAB — PROTIME-INR
INR: 2.32
Prothrombin Time: 25.1 seconds — ABNORMAL HIGH (ref 11.4–15.2)

## 2018-11-04 MED ORDER — ENOXAPARIN SODIUM 120 MG/0.8ML ~~LOC~~ SOLN
115.0000 mg | Freq: Two times a day (BID) | SUBCUTANEOUS | Status: DC
  Administered 2018-11-05: 115 mg via SUBCUTANEOUS
  Filled 2018-11-04: qty 0.77

## 2018-11-04 MED ORDER — WARFARIN SODIUM 7.5 MG PO TABS
12.5000 mg | ORAL_TABLET | Freq: Once | ORAL | Status: AC
  Administered 2018-11-04: 12.5 mg via ORAL
  Filled 2018-11-04: qty 1

## 2018-11-04 MED ORDER — ENOXAPARIN SODIUM 120 MG/0.8ML ~~LOC~~ SOLN
115.0000 mg | Freq: Once | SUBCUTANEOUS | Status: AC
  Administered 2018-11-04: 115 mg via SUBCUTANEOUS
  Filled 2018-11-04: qty 0.77

## 2018-11-04 NOTE — Consult Note (Signed)
Reason for Consult:Prosthetic valve endocarditis Referring Physician: Family medicine  Oscar Brown is an 52 y.o. male.  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 He has a past history significant for chronic diastolic heart failure, atrial fibrillation, lymphedema, DVT of left leg, OSA and COPD. Initially developed a rash and swelling of right leg about 2 weeks ago. Treated as outpatient with antibiotics. On the day prior to admission he developed a rash and swelling of left leg. While at MD's office he took off compression hose and became dizzy. No LOC- sent to ED. Admitted and started on Iv antibiotics. TEE showed a vegetation on the prosthetic mitral valve.   He has not experienced any further presyncopal symptoms since admission. Denies shortness of breath.  Duplex showed DVT in profunda vein.  Blood cultures growing Strep agalactiae. Currently on PCN G and garamycin.  Past Medical History:  Diagnosis Date  . Acute diastolic congestive heart failure ([place]) 09/21/2015   takes Lasix daily  . Acute renal failure ([facility])    ATN s/p cardiac arrest  . Atrial fibrillation ([place])   . 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  Complicated by chronic left lower extremity edema and occasional recurrent left lower extremity cellulitis   . COPD (chronic obstructive pulmonary disease) ([place])   . DVT (deep venous thrombosis) ([place]) early 80's   left leg  . HLD (hyperlipidemia)   . Incidental lung nodule, less than or equal to 11mm 11/10/2015   3 mm nodule right lung  . Lymphedema early 41's  . Mitral regurgitation 10/06/2012  . Obstructive sleep apnea 12/14/2006   AHI 19.2/hr, desaturation to 75% on Polysomnography 02/26/2005.  Uses 11 cm H2O nocturnal nasal CPAP with good symptom control.   . S/P minimally invasive mitral valve replacement with metallic valve 30/05/6225   33 mm  [employer] Optiform bileaflet mechanical prosthesis placed via right mini-thoracotomy approach    Past Surgical History:  Procedure Laterality Date  . A-FLUTTER ABLATION N/A 03/20/2018   Procedure: A-FLUTTER ABLATION;  Surgeon: Evans Lance, MD;  Location: [facility];  Service: Cardiovascular;  Laterality: N/A;  . CARDIAC CATHETERIZATION N/A 11/03/2015   Procedure: Right/Left Heart Cath and Coronary Angiography;  Surgeon: Leonie Man, MD;  Location: [facility];  Service: Cardiovascular;  Laterality: N/A;  . CARDIOVERSION N/A 12/23/2017   Procedure: CARDIOVERSION;  Surgeon: Thayer Headings, MD;  Location: [facility];  Service: Cardiovascular;  Laterality: N/A;  . MITRAL VALVE REPLACEMENT Right 11/13/2015   Procedure: MINIMALLY INVASIVE MITRAL VALVE (MV) REPLACEMENT;  Surgeon: [name], MD;  Location: [facility];  Service: Open Heart Surgery;  Laterality: Right;  . MULTIPLE EXTRACTIONS WITH ALVEOLOPLASTY N/A 11/07/2015   Procedure: Extraction of tooth #'s 1,2,8,9,12,15,16,17, 32 with alveoloplasty and gross debridement of remaining teeth.;  Surgeon: [name], DDS;  Location: [facility];  Service: Oral Surgery;  Laterality: N/A;  . TEE WITHOUT CARDIOVERSION N/A 10/16/2015   Procedure: TRANSESOPHAGEAL ECHOCARDIOGRAM (TEE);  Surgeon: Skeet Latch, MD;  Location: [facility];  Service: Cardiovascular;  Laterality: N/A;  . TEE WITHOUT CARDIOVERSION N/A 11/13/2015   Procedure: TRANSESOPHAGEAL ECHOCARDIOGRAM (TEE);  Surgeon: [name], MD;  Location: [facility];  Service: Open Heart Surgery;  Laterality: N/A;    Family History  Problem Relation Age of Onset  . Cancer Mother  type unknown but had a colostomy bag  . Other Father        bone disease  . Diabetes Paternal Aunt     Social History:  reports that he quit smoking about 6 years ago. His smoking use included cigarettes. He has a 0.90 pack-year smoking history. He has never used smokeless tobacco. He  reports that he drinks alcohol. He reports that he does not use drugs.  Allergies:  Allergies  Allergen Reactions  . Iohexol Hives, Itching and Other (See Comments)    Patient needs 13 hour prep per Dr. Pascal Lux, after cath he broke out in hives and had itching     Medications:  Scheduled: . aspirin  81 mg Oral Daily  . [START ON 11/05/2018] enoxaparin (LOVENOX) injection  115 mg Subcutaneous Q12H  . rosuvastatin  20 mg Oral q1800  . warfarin  12.5 mg Oral ONCE-1800  . Warfarin - Pharmacist Dosing Inpatient   Does not apply q1800    Results for orders placed or performed during the hospital encounter of 11/01/18 (from the past 48 hour(s))  Protime-INR     Status: Abnormal   Collection Time: 11/03/18  3:28 AM  Result Value Ref Range   Prothrombin Time 26.2 (H) 11.4 - 15.2 seconds   INR 2.44     Comment: Performed at [facility] [address] [place], [place] [address]  Basic metabolic panel     Status: Abnormal   Collection Time: 11/03/18  8:43 AM  Result Value Ref Range   Sodium 135 135 - 145 mmol/L   Potassium 4.1 3.5 - 5.1 mmol/L   Chloride 105 98 - 111 mmol/L   CO2 23 22 - 32 mmol/L   Glucose, Bld 115 (H) 70 - 99 mg/dL   BUN 10 6 - 20 mg/dL   Creatinine, Ser 0.77 0.61 - 1.24 mg/dL   Calcium 8.6 (L) 8.9 - 10.3 mg/dL   GFR calc non Af Amer >60 >60 mL/min   GFR calc Af Amer >60 >60 mL/min   Anion gap 7 5 - 15    Comment: Performed at [facility] [address], [place], [place] [address]  Protime-INR     Status: Abnormal   Collection Time: 11/04/18  3:54 AM  Result Value Ref Range   Prothrombin Time 25.1 (H) 11.4 - 15.2 seconds   INR 2.32     Comment: Performed at [facility] [address], [place], [place] [address]  Culture, blood (Routine X 2) w Reflex to ID Panel     Status: None (Preliminary result)   Collection Time: 11/04/18 11:07 AM  Result Value Ref Range   Specimen Description BLOOD BLOOD LEFT HAND    Special Requests AEROBIC BOTTLE  ONLY Blood Culture adequate volume    Culture      NO GROWTH < 12 HOURS Performed at [facility] [address], [place], [place] [address]    Report Status PENDING   Culture, blood (Routine X 2) w Reflex to ID Panel     Status: None (Preliminary result)   Collection Time: 11/04/18 11:07 AM  Result Value Ref Range   Specimen Description BLOOD BLOOD RIGHT WRIST    Special Requests      AEROBIC BOTTLE ONLY Blood Culture results may not be optimal due to an inadequate volume of blood received in culture bottles   Culture      NO GROWTH < 12 HOURS Performed at [facility], [place] [address] [place], [place] [address]    Report Status PENDING     Vas [place] Lower Extremity Venous (dvt)  Result Date: 11/03/2018  Lower Venous Study Indications: Swelling, and history of DVT. Other Indications: Suspected DVT. Limitations: Body habitus and very poor visibility of calf veins. Performing Technologist: Lorina Rabon  Examination Guidelines: A complete evaluation includes B-mode imaging, spectral Doppler, color Doppler, and power Doppler as needed of all accessible portions of each vessel. Bilateral testing is considered an integral part of a complete examination. Limited examinations for reoccurring indications may be performed as noted.  Right Venous Findings: +---+---------------+---------+-----------+----------+-------+    CompressibilityPhasicitySpontaneityPropertiesSummary +---+---------------+---------+-----------+----------+-------+ CFVFull           Yes      Yes                          +---+---------------+---------+-----------+----------+-------+  Left Venous Findings: +---------+---------------+---------+-----------+----------+-------------------+          CompressibilityPhasicitySpontaneityPropertiesSummary             +---------+---------------+---------+-----------+----------+-------------------+ CFV      Full           Yes      Yes                  POOR FLOW            +---------+---------------+---------+-----------+----------+-------------------+ SFJ      Full                                                             +---------+---------------+---------+-----------+----------+-------------------+ FV Prox  Full                                                             +---------+---------------+---------+-----------+----------+-------------------+ FV Mid   Full                                                             +---------+---------------+---------+-----------+----------+-------------------+ FV DistalFull                                                             +---------+---------------+---------+-----------+----------+-------------------+ PFV      Partial                                      Acute               +---------+---------------+---------+-----------+----------+-------------------+ POP      Full                    Yes  dilated vein with                                                         poor flow           +---------+---------------+---------+-----------+----------+-------------------+ PTV                                                   poor visibility     +---------+---------------+---------+-----------+----------+-------------------+ PERO                                                  poor visibility     +---------+---------------+---------+-----------+----------+-------------------+    Summary: Right: No evidence of common femoral vein obstruction. Left: Findings consistent with acute deep vein thrombosis involving the left proximal profunda vein. No cystic structure found in the popliteal fossa. Calf veins unable to see clearly, exam cannot completely exclude the possibility of thrombosis in calf veins.  *See table(s) above for measurements and observations.    Preliminary     Review of Systems  Constitutional: Positive for fever and  malaise/fatigue.  Respiratory: Negative for shortness of breath.   Cardiovascular: Positive for leg swelling. Negative for chest pain and orthopnea.  Neurological: Positive for dizziness. Negative for focal weakness, seizures and loss of consciousness.   Blood pressure (!) 126/52, pulse 64, temperature 97.8 F (36.6 C), temperature source Oral, resp. rate 17, height 5\' 7"  (1.702 m), weight 114.9 kg, SpO2 100 %. Physical Exam  Vitals reviewed. Constitutional: He is oriented to person, place, and time. No distress.  obese  HENT:  Head: Normocephalic and atraumatic.  Mouth/Throat: No oropharyngeal exudate.  Eyes: Conjunctivae and EOM are normal. No scleral icterus.  Neck: Neck supple. No thyromegaly present.  Cardiovascular: Normal rate and regular rhythm.  No murmur heard. Good valve click  Respiratory: Effort normal and breath sounds normal. No respiratory distress. He has no wheezes. He has no rales.  GI: Soft. He exhibits no distension. There is no tenderness.  Musculoskeletal: He exhibits edema.  Lymphadenopathy:    He has no cervical adenopathy.  Neurological: He is alert and oriented to person, place, and time. No cranial nerve deficit. He exhibits normal muscle tone. Coordination normal.  Skin: Skin is warm and dry. He is not diaphoretic.   ECHOCARDIOGRAM Study Conclusions  - Left ventricle: Systolic function was normal. The estimated   ejection fraction was in the range of 55% to 60%. Wall motion was   normal; there were no regional wall motion abnormalities. - Aortic valve: No evidence of vegetation. There was no   regurgitation. - Aorta: There was moderate non-mobile atheroma. - Descending aorta: The descending aorta was normal in size. - Mitral valve: Mechanical 33 mm Carbomedics Optiform bi-leaflet   valve. Leaflets are opening well. Normal transmitral gradient.   There was mild regurgitation. Mean gradient (D): 4 mm Hg. - Left atrium: The atrium was dilated. No  evidence of thrombus in   the atrial cavity or appendage. No evidence of thrombus in the   atrial cavity  or appendage. - Right ventricle: The cavity size was moderately dilated. Wall   thickness was normal. Systolic function was mildly reduced. - Right atrium: The atrium was dilated. No evidence of thrombus in   the atrial cavity or appendage. - Tricuspid valve: No evidence of vegetation. There was moderate   regurgitation.  Impressions:  - There is a highly mobile echodensity attached to the anterior   portion of the sewing ring of the mechanical mitral valve   measuring 12 x 5 mm consistent with prosthesis vegetation.   Mechanical mitral valve leaflets are opening well and have normal   trans-mitral gradients.   There are no vegetations on the aortic, tricuspid or pulmonic   valves.  Assessment/Plan: 52 yo man with multiple medical problems including history of MR, MVR in 2016, CHF, COPD, obesity, OSA, left leg DVT, lymphedema. Presents with cellulitis of left leg and prosthetic valve endocarditis.  He is growing strep agalactiae from blood cultures and TEE shows a vegetation on the valve sewing ring. Leaflets are functioning normally, no evidence of annular abscess. He has no evidence of left heart failure or embolization. He is afebrile and WBC trending down with antibiotics.  There is no indication for surgery at this time.  He has a history of DVT of the left leg. If the current thrombus is felt to be acute, hematology should be consulted as it would have developed while on therapeutic anticoagulation. This may be his old clot as he has chronic lymphedema.   Melrose Nakayama 11/04/2018, 4:07 PM

## 2018-11-04 NOTE — Progress Notes (Signed)
ANTICOAGULATION CONSULT NOTE - Follow-Up Consult  Pharmacy Consult for Warfarin Indication: h/o MVR, afib and h/o DVT  Patient Measurements: Height: 5\' 7"  (170.2 cm) Weight: 253 lb 4.9 oz (114.9 kg) IBW/kg (Calculated) : 66.1   Vital Signs: Temp: 97.8 F (36.6 C) (12/07 1354) Temp Source: Oral (12/07 1354) BP: 126/52 (12/07 1354) Pulse Rate: 64 (12/07 1354)  Labs: Recent Labs    11/01/18 1551 11/02/18 0524 11/03/18 0328 11/03/18 0843 11/04/18 0354  HGB 15.9 11.9*  --   --   --   HCT 51.6 37.2*  --   --   --   PLT 212 169  --   --   --   APTT 37*  --   --   --   --   LABPROT 24.8* 29.7* 26.2*  --  25.1*  INR 2.27 2.87 2.44  --  2.32  CREATININE 1.20 0.88  --  0.77  --     Estimated Creatinine Clearance: 130.8 mL/min (by C-G formula based on SCr of 0.77 mg/dL).   Assessment: 55 YOM on warfarin PTA for hx mechanical MVR, Afib, and DVT on warfarin PTA. Admit INR 2.27 on PTA dose of warfarin is 10mg  M, Fr, and 7.5mg  all other day (held 12/2 for INR 3.6 and on 12/3 took 5mg ). Pharmacy consulted to resume warfarin dosing this admit.   INR today remains SUBtherapeutic (INR 2.32 << 2.44, goal of 2.5-3.5) and has been now for ~48 hours. Also noted dopplers showing new LLE DVT on 12/6. Discussed with FMTS and will start Lovenox bridge until INR >/=2.5.   Goal of Therapy:  INR 2.5-3.5 Monitor platelets by anticoagulation protocol: Yes   Plan:  - Lovenox 115 mg x 1 dose now followed by q12h dosing starting on 12/8 AM - Warfarin 12.5 mg x 1 dose at 1800 today - Will continue to monitor for any signs/symptoms of bleeding and will follow up with PT/INR in the a.m.   Thank you for allowing pharmacy to be a part of this patient's care.  Alycia Rossetti, PharmD, BCPS Clinical Pharmacist Pager: [phone] Clinical phone for 11/04/2018 from 7a-3:30p: [phone] If after 3:30p, please call main pharmacy at: [phone] Please check AMION for all [place] numbers 11/04/2018 2:32 PM

## 2018-11-04 NOTE — Progress Notes (Signed)
Family Medicine Teaching Service Daily Progress Note Intern Pager: [phone]  Patient name: Oscar Brown Medical record number: [id number] Date of birth: 07/08/1966 Age: 52 y.o. Gender: male  Primary Care Provider: Marton Redwood, MD Consultants: none Code Status: Full code  Pt Overview and Major Events to Date:  Hospital Day 3 Admitted: 11/01/2018   Assessment and Plan: Oscar Brown is a 52 y.o. male presenting with rash on left leg, fever, and a presyncopal episode today.  PMH is significant for mitral valve replacement on warfarin, OSA, COPD, CHF.     Step Agalactiae Bacteremia likely 2/2 non-puritic cellulitis of the left lower extremity- Improving with resolved sepsis. patient developed left leg cellulitis while being treated with Abx for cellulitis of the R. Leg.  Blood Cx drawn and Broad Spec Abx started.  Patient currently afebrile, systolic BP improved, now over 100, LA has dropped to 1.5, wbc down to 12.1. Strep agalactiae detected on 3/4 blood Cx . Normal exam today and no complaints from patient.  He is feeling well.  Patient on penicillin G 30m q4h, per pharm recs. TEE showed endocarditis - cards consulted: added gentamicin 3mg /kg for 2 weeks, continue IV PNC G for 6wks total - CVTS consulted; appreciate recs - Blood Cx sensitivities only resistance to Erythromycin.  - cardiac monitoring - CBC, BMP - monitor cellulitis margins.   Endocarditis Patient found to have Strep [place]a bacteremia now found to have highly mobile echodensity attached to the anterior portio of the sewing ring of the mechanical mitral valve measuring 12 x 46mm consistent with prosthesis vegetation.  - Consult CVTS; appreciate recs - Currently on IV PNC G 4 million units q4hrs/day for 6wks - ID recs: Start Gentamicin 3mg /kg q24hrs for 2wks  Mitral regurgitation s/p Mitral valve replacement/systolic heart failure - Patient follows with heart failure clinic. 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 In the post-op period he experienced a prolonged VF arrest and subsequently was noted to have EF 35-45% which subsequently improved. ECHO on 01/2018 shows EF 55%, D shaped interventricular septum suggestive of RV overload, mechanical mitral valve. Takes furosemide 40mg  twice a week on Monday and Friday.  I/O: net 0 past 24hrs. - holding furosemide -monitor fluid status closely, strict I/O.  -on warfarin at home. INR on admission 2.27>2.87>2.44> 2.32  Was 3.6 on 12/2.  - continue warfarin.  Pharmacy to dose. - daily INR   - monitor lung exam for signs of hypervolemia while we are fluid resuscitating for sepsis management  COPD - reported history of COPD.  Patient is not taking medications for this. Lungs clear on exam, O2 saturation WNL.   OSA- patient reports wearing cpap every night.  - continue CPAP.   DVT of Right vein Findings consistent with DVT of left proximal profunda vein. Found on Vas U/S LE on the right. - Currently on full anticoagulation on warfarin dosed per pharmacy due to mechanical heart valve - CVTS recommended no changes to anticoagulation  Atrial flutter, historical, resolved - patient had reported history of afib/flutter before his heart valve replacement but is not currently taking any rate controlling medications. On 03/20/2018 patient had successful a-flutter radiofrequency ablation treatment - on continuous cardiac monitoring.   HTN - hypotensive on admission. patient denies having HTN but it is noted in cardiology notes. Previously controlled per outpt notes - holding furosemide in setting of sepsis and fluid rehydration  CAD - cath 12/16 distal left circumflex 45% stenosis. No chest pain  on admission. Last lipid panel 09/22/2015 LDL 116, Total cholesterol 187, HDL 60. On crestor 20mg  at home.  - continue crestor, ASA   Obesity - Body mass index is 39.16 kg/m. Continue to encourage lifestyle modifications.   .  sodium chloride    . gentamicin 260 mg (11/04/18 0102)  . pencillin G potassium IV 4 Million Units (11/04/18 9470)   Electrolytes: replete PRN  Nutrition: regular diet GI ppx: none DVT px: warfarin  Disposition: home   Medications: Scheduled Meds: . aspirin  81 mg Oral Daily  . rosuvastatin  20 mg Oral q1800  . Warfarin - Pharmacist Dosing Inpatient   Does not apply q1800   Continuous Infusions: . sodium chloride    . gentamicin 260 mg (11/04/18 0102)  . pencillin G potassium IV 4 Million Units (11/04/18 9628)   PRN Meds: acetaminophen **OR** acetaminophen, polyethylene glycol  ================================================= ================================================= Subjective:  Patient states he feels good. He is somewhat concerned about the possibility of having a surgery but remained in good spirits. He was resting in his chair comfortably with no complaints.  Objective: Temp:  [97.9 F (36.6 C)-98.7 F (37.1 C)] 98.7 F (37.1 C) (12/07 0403) Pulse Rate:  [54-65] 56 (12/07 0403) Resp:  [11-20] 17 (12/07 0403) BP: (91-170)/(54-142) 95/60 (12/07 0403) SpO2:  [93 %-100 %] 98 % (12/07 0403) Weight:  [114.9 kg] 114.9 kg (12/07 0500) Intake/Output 12/06 0701 - 12/07 0700 In: 0  Out: 1400 [Urine:1400] Physical Exam:  Gen: NAD HEENT: NCAT  CV: RRR, no murmurs, click heard during beginning of diastole consistent with mechanical heart valve Resp: CTAB, no wheezing or rales Abd: non-distended, non-tender, soft, +bs Psych: appropriate and cooperative Ext: right leg is not warm or tender, improving erythema with margins decreasing.   Laboratory: Recent Labs  Lab 11/01/18 1551 11/02/18 0524  WBC 14.6* 12.1*  HGB 15.9 11.9*  HCT 51.6 37.2*  PLT 212 169   Recent Labs  Lab 11/01/18 1551 11/02/18 0524 11/03/18 0843  NA 134* 135 135  K 4.5 3.9 4.1  CL 97* 105 105  CO2 23 21* 23  BUN 10 11 10   CREATININE 1.20 0.88 0.77  CALCIUM 9.5 8.2* 8.6*  PROT  8.7*  --   --   BILITOT 1.5*  --   --   ALKPHOS 74  --   --   ALT 31  --   --   AST 54*  --   --   GLUCOSE 106* 102* 115*   Imaging/Diagnostic Tests: Vas [place] Lower Extremity Venous (dvt)  Result Date: 11/03/2018  Lower Venous Study Indications: Swelling, and history of DVT. Other Indications: Suspected DVT. Limitations: Body habitus and very poor visibility of calf veins. Performing Technologist: Lorina Rabon  Examination Guidelines: A complete evaluation includes B-mode imaging, spectral Doppler, color Doppler, and power Doppler as needed of all accessible portions of each vessel. Bilateral testing is considered an integral part of a complete examination. Limited examinations for reoccurring indications may be performed as noted.  Right Venous Findings: +---+---------------+---------+-----------+----------+-------+    CompressibilityPhasicitySpontaneityPropertiesSummary +---+---------------+---------+-----------+----------+-------+ CFVFull           Yes      Yes                          +---+---------------+---------+-----------+----------+-------+  Left Venous Findings: +---------+---------------+---------+-----------+----------+-------------------+          CompressibilityPhasicitySpontaneityPropertiesSummary             +---------+---------------+---------+-----------+----------+-------------------+ CFV  Full           Yes      Yes                  POOR FLOW           +---------+---------------+---------+-----------+----------+-------------------+ SFJ      Full                                                             +---------+---------------+---------+-----------+----------+-------------------+ FV Prox  Full                                                             +---------+---------------+---------+-----------+----------+-------------------+ FV Mid   Full                                                              +---------+---------------+---------+-----------+----------+-------------------+ FV DistalFull                                                             +---------+---------------+---------+-----------+----------+-------------------+ PFV      Partial                                      Acute               +---------+---------------+---------+-----------+----------+-------------------+ POP      Full                    Yes                  dilated vein with                                                         poor flow           +---------+---------------+---------+-----------+----------+-------------------+ PTV                                                   poor visibility     +---------+---------------+---------+-----------+----------+-------------------+ PERO                                                  poor visibility     +---------+---------------+---------+-----------+----------+-------------------+  Summary: Right: No evidence of common femoral vein obstruction. Left: Findings consistent with acute deep vein thrombosis involving the left proximal profunda vein. No cystic structure found in the popliteal fossa. Calf veins unable to see clearly, exam cannot completely exclude the possibility of thrombosis in calf veins.  *See table(s) above for measurements and observations.    Preliminary       Oscar Alpha, DO 11/04/2018, 8:43 AM PGY-1, [place] Intern pager: [phone], text pages welcome

## 2018-11-04 NOTE — Consult Note (Signed)
[facility] for Infectious Disease       Reason for Consult: PVE    Referring Physician: Dr. Ardelia Mems  Active Problems:   OSA (obstructive sleep apnea)   Sepsis due to cellulitis [facility])   H/O mitral valve replacement with mechanical valve   Essential hypertension   . aspirin  81 mg Oral Daily  . enoxaparin (LOVENOX) injection  115 mg Subcutaneous Once  . [START ON 11/05/2018] enoxaparin (LOVENOX) injection  115 mg Subcutaneous Q12H  . rosuvastatin  20 mg Oral q1800  . warfarin  12.5 mg Oral ONCE-1800  . Warfarin - Pharmacist Dosing Inpatient   Does not apply q1800    Recommendations: Continue with penicillin and gentamicin CVTS consultation (patient of Dr. Roxy Manns)  Assessment: PVE of mitral valve   Antibiotics: Penicillin day 3  Gentamicin day 2  HPI: Oscar Brown is a 52 y.o. male with a history of afib s/p cardioconversion, MV replacement due to severe mitral regurgitation from rheumatic heart disease on chronic anticoagulation who came in with cellulitis of LLE and blood cultures 2/2 with GBS. Ampicillin/penicillin sensitive.  Follows in heart failure clinic.  Work up revealed MVE of his prosthetic valve.  No associated sob.     Review of Systems:  Constitutional: negative for fevers, chills, malaise and anorexia Respiratory: negative for cough or dyspnea on exertion Cardiovascular: negative for chest pressure/discomfort Gastrointestinal: negative for diarrhea All other systems reviewed and are negative    Past Medical History:  Diagnosis Date  . Acute diastolic congestive heart failure ([place]) 09/21/2015   takes Lasix daily  . Acute renal failure ([facility])    ATN s/p cardiac arrest  . Atrial fibrillation ([place])   . 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  Complicated by chronic left lower extremity edema and occasional recurrent left lower extremity cellulitis   . COPD (chronic obstructive pulmonary disease) ([place])   . DVT  (deep venous thrombosis) ([place]) early 80's   left leg  . HLD (hyperlipidemia)   . Incidental lung nodule, less than or equal to 42mm 11/10/2015   3 mm nodule right lung  . Lymphedema early 74's  . Mitral regurgitation 10/06/2012  . Obstructive sleep apnea 12/14/2006   AHI 19.2/hr, desaturation to 75% on Polysomnography 02/26/2005.  Uses 11 cm H2O nocturnal nasal CPAP with good symptom control.   . S/P minimally invasive mitral valve replacement with metallic valve 05/22/7627   33 mm Carbomedics Optiform bileaflet mechanical prosthesis placed via right mini-thoracotomy approach    Social History   Tobacco Use  . Smoking status: Former Smoker    Packs/day: 0.10    Years: 9.00    Pack years: 0.90    Types: Cigarettes    Last attempt to quit: 09/05/2012    Years since quitting: 6.1  . Smokeless tobacco: Never Used  Substance Use Topics  . Alcohol use: Yes    Alcohol/week: 0.0 standard drinks    Comment: 1 beer once a week or special occasions  . Drug use: No    Family History  Problem Relation Age of Onset  . Cancer Mother        type unknown but had a colostomy bag  . Other Father        bone disease  . Diabetes Paternal Aunt     Allergies  Allergen Reactions  . Iohexol Hives, Itching and Other (See Comments)    Patient needs 13 hour prep per Dr. Pascal Lux, after  cath he broke out in hives and had itching     Physical Exam: Constitutional: in no apparent distress and alert  Vitals:   11/04/18 0403 11/04/18 1354  BP: 95/60 (!) 126/52  Pulse: (!) 56 64  Resp: 17   Temp: 98.7 F (37.1 C) 97.8 F (36.6 C)  SpO2: 98% 100%   EYES: anicteric ENMT: no thrush Cardiovascular: Cor RRR Respiratory: CTA B; normal respiratory effort GI: soft, nt Musculoskeletal: bilateral leg edema; no erythema, no warmth noted by me on either leg Skin: no rash Hematologic: no cervical lad  Lab Results  Component Value Date   WBC 12.1 (H) 11/02/2018   HGB 11.9 (L) 11/02/2018   HCT 37.2 (L)  11/02/2018   MCV 87.9 11/02/2018   PLT 169 11/02/2018    Lab Results  Component Value Date   CREATININE 0.77 11/03/2018   BUN 10 11/03/2018   NA 135 11/03/2018   K 4.1 11/03/2018   CL 105 11/03/2018   CO2 23 11/03/2018    Lab Results  Component Value Date   ALT 31 11/01/2018   AST 54 (H) 11/01/2018   ALKPHOS 74 11/01/2018     Microbiology: Recent Results (from the past 240 hour(s))  Blood Culture (routine x 2)     Status: Abnormal   Collection Time: 11/01/18  4:10 PM  Result Value Ref Range Status   Specimen Description BLOOD SITE NOT SPECIFIED  Final   Special Requests   Final    BOTTLES DRAWN AEROBIC AND ANAEROBIC Blood Culture adequate volume   Culture  Setup Time   Final    GRAM POSITIVE COCCI IN BOTH AEROBIC AND ANAEROBIC BOTTLES CRITICAL RESULT CALLED TO, READ BACK BY AND VERIFIED WITHAlona Bene PHARMD 0539 11/02/18 A BROWNING Performed at [facility], [address], [place], [place] [address]    Culture GROUP B STREP(S.AGALACTIAE)ISOLATED (A)  Final   Report Status 11/04/2018 FINAL  Final   Organism ID, Bacteria GROUP B STREP(S.AGALACTIAE)ISOLATED  Final      Susceptibility   Group b strep(s.agalactiae)isolated - MIC*    CLINDAMYCIN <=0.25 SENSITIVE Sensitive     AMPICILLIN <=0.25 SENSITIVE Sensitive     ERYTHROMYCIN 2 RESISTANT Resistant     VANCOMYCIN 0.5 SENSITIVE Sensitive     CEFTRIAXONE <=0.12 SENSITIVE Sensitive     LEVOFLOXACIN 1 SENSITIVE Sensitive     * GROUP B STREP(S.AGALACTIAE)ISOLATED  Blood Culture ID Panel (Reflexed)     Status: Abnormal   Collection Time: 11/01/18  4:10 PM  Result Value Ref Range Status   Enterococcus species NOT DETECTED NOT DETECTED Final   Listeria monocytogenes NOT DETECTED NOT DETECTED Final   Staphylococcus species NOT DETECTED NOT DETECTED Final   Staphylococcus aureus (BCID) NOT DETECTED NOT DETECTED Final   Streptococcus species DETECTED (A) NOT DETECTED Final    Comment: CRITICAL RESULT CALLED TO, READ BACK  BY AND VERIFIED WITH: V BRYK PHARMD 1937 11/02/18 A BROWNING    Streptococcus agalactiae DETECTED (A) NOT DETECTED Final    Comment: CRITICAL RESULT CALLED TO, READ BACK BY AND VERIFIED WITHAlona Bene PHARMD 9024 11/02/18 A BROWNING    Streptococcus pneumoniae NOT DETECTED NOT DETECTED Final   Streptococcus pyogenes NOT DETECTED NOT DETECTED Final   Acinetobacter baumannii NOT DETECTED NOT DETECTED Final   Enterobacteriaceae species NOT DETECTED NOT DETECTED Final   Enterobacter cloacae complex NOT DETECTED NOT DETECTED Final   Escherichia coli NOT DETECTED NOT DETECTED Final   Klebsiella oxytoca NOT DETECTED NOT  DETECTED Final   Klebsiella pneumoniae NOT DETECTED NOT DETECTED Final   Proteus species NOT DETECTED NOT DETECTED Final   Serratia marcescens NOT DETECTED NOT DETECTED Final   Haemophilus influenzae NOT DETECTED NOT DETECTED Final   Neisseria meningitidis NOT DETECTED NOT DETECTED Final   Pseudomonas aeruginosa NOT DETECTED NOT DETECTED Final   Candida albicans NOT DETECTED NOT DETECTED Final   Candida glabrata NOT DETECTED NOT DETECTED Final   Candida krusei NOT DETECTED NOT DETECTED Final   Candida parapsilosis NOT DETECTED NOT DETECTED Final   Candida tropicalis NOT DETECTED NOT DETECTED Final    Comment: Performed at [facility], [place] [address] [place], [place] [address]  Blood Culture (routine x 2)     Status: Abnormal   Collection Time: 11/01/18  4:30 PM  Result Value Ref Range Status   Specimen Description BLOOD RIGHT ANTECUBITAL  Final   Special Requests   Final    BOTTLES DRAWN AEROBIC AND ANAEROBIC Blood Culture adequate volume   Culture  Setup Time   Final    GRAM POSITIVE COCCI IN BOTH AEROBIC AND ANAEROBIC BOTTLES CRITICAL RESULT CALLED TO, READ BACK BY AND VERIFIED WITHAlona Bene PHARMD 6045 11/02/18 A BROWNING    Culture (A)  Final    GROUP B STREP(S.AGALACTIAE)ISOLATED SUSCEPTIBILITIES PERFORMED ON PREVIOUS CULTURE WITHIN THE LAST 5 DAYS. Performed  at [facility], [place] [address], [place], [place] [address]    Report Status 11/04/2018 FINAL  Final  Culture, blood (Routine X 2) w Reflex to ID Panel     Status: None (Preliminary result)   Collection Time: 11/04/18 11:07 AM  Result Value Ref Range Status   Specimen Description BLOOD BLOOD LEFT HAND  Final   Special Requests AEROBIC BOTTLE ONLY Blood Culture adequate volume  Final   Culture   Final    NO GROWTH < 12 HOURS Performed at [facility], [place] [address], [place], [place] [address]    Report Status PENDING  Incomplete  Culture, blood (Routine X 2) w Reflex to ID Panel     Status: None (Preliminary result)   Collection Time: 11/04/18 11:07 AM  Result Value Ref Range Status   Specimen Description BLOOD BLOOD RIGHT WRIST  Final   Special Requests   Final    AEROBIC BOTTLE ONLY Blood Culture results may not be optimal due to an inadequate volume of blood received in culture bottles   Culture   Final    NO GROWTH < 12 HOURS Performed at [facility], [place] [address] [place], [place] [address]    Report Status PENDING  Incomplete    Thayer Headings, [facility] for Infectious Disease [place] [email or web address] [phone] pager  [phone] cell 11/04/2018, 3:03 PM

## 2018-11-04 NOTE — Progress Notes (Signed)
Patient places self on and off CPAP and will call is any assistance needed

## 2018-11-05 DIAGNOSIS — I330 Acute and subacute infective endocarditis: Secondary | ICD-10-CM

## 2018-11-05 LAB — BASIC METABOLIC PANEL
Anion gap: 11 (ref 5–15)
BUN: 10 mg/dL (ref 6–20)
CHLORIDE: 102 mmol/L (ref 98–111)
CO2: 23 mmol/L (ref 22–32)
Calcium: 8.7 mg/dL — ABNORMAL LOW (ref 8.9–10.3)
Creatinine, Ser: 0.84 mg/dL (ref 0.61–1.24)
GFR calc Af Amer: >60 mL/min (ref >60)
GFR calc non Af Amer: >60 mL/min (ref >60)
Glucose, Bld: 94 mg/dL (ref 70–99)
POTASSIUM: 4.2 mmol/L (ref 3.5–5.1)
Sodium: 136 mmol/L (ref 135–145)

## 2018-11-05 LAB — PROTIME-INR
INR: 2.67
Prothrombin Time: 28.1 seconds — ABNORMAL HIGH (ref 11.4–15.2)

## 2018-11-05 MED ORDER — WHITE PETROLATUM EX OINT
TOPICAL_OINTMENT | CUTANEOUS | Status: AC
  Administered 2018-11-05: 16:00:00
  Filled 2018-11-05: qty 28.35

## 2018-11-05 MED ORDER — WARFARIN SODIUM 5 MG PO TABS
10.0000 mg | ORAL_TABLET | Freq: Once | ORAL | Status: AC
  Administered 2018-11-05: 10 mg via ORAL
  Filled 2018-11-05: qty 2

## 2018-11-05 NOTE — Progress Notes (Signed)
Family Medicine Teaching Service Daily Progress Note Intern Pager: [phone]  Patient name: Oscar Brown Medical record number: [id number] Date of birth: [birth year] Age: 52 y.o. Gender: male  Primary Care Provider: Marton Redwood, MD Consultants: ID, CVTS, cardiology Code Status: Full code  Pt Overview and Major Events to Date:  Hospital Day 4 Admitted: 11/01/2018  Assessment and Plan: [name] a 52 y.o.malepresenting with rash on left leg, fever, and a presyncopal episode concerning for sepsis and found to have cellulitis and endocarditis. PMH is significant for mitral valve replacement on warfarin, OSA, COPD, CHF.   Sepsiswith strep agalactieae bacteremia, endocarditis of mechanical valve, and cellulitis. - patient developed left leg cellulitis while being treated with Abx for cellulitis of the R. Leg.  Blood Cx drawn and Broad Spec Abx started.  Patient no longer septic. Strep agalactiae detected on 3/4 blood Cx . Normal exam today and no complaints from patient.  He is feeling well. Cellulitis improving in appearance.  Patient on penicillin G 39m q4h and gentamycin. TEE showed vegetation on mechanical valve. CVTS consulted and will not do surgery.  ID consulted, appreciate recs. Awaiting ID recs on  PICC line placement.   - penicillin G 6 weeks, per ID - gentamycin 2 weeks, per ID.    - cardiac monitoring -CBC, BMP - monitor cellulitis margins.   DVT of left proximal profunda vein - patient has been therapeutic on warfarin for months. Possibly a chronic clot.  Will consult heme to see if adjustments need to be made to anticoagulation.  - continue warfarin.   Mitral regurgitations/p Mitral valvereplacement/systolic heart failure-Patient follows with heart failure clinic. 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 In the post-op period he experienced a prolonged VF arrest and subsequently was noted to have EF 35-45% which  subsequently improved.ECHO on 01/2018 shows EF 55%, D shaped interventricular septum suggestive of RV overload, mechanical mitral valve.Takes furosemide 40mg  twice a week on Monday and Friday. I/O: 2600 input, 2000 output.   - holding furosemide -monitor fluid status closely, strict I/O.  -on warfarin at home.INR on admission 2.27>2.87>2.44> 2.32> 2.67   -continuewarfarin. Pharmacy to dose. - daily INR - monitor lung exam for signs of hypervolemia while we are fluid resuscitating for sepsis management  COPD- reported history of COPD. Patient is not taking medications for this.Lungs clear on exam, O2 saturation WNL.  OSA-patient reports wearingcpap every night. - continue CPAP.  Atrial flutter, historical, resolved-patient had reported history of afib/flutter before his heart valve replacement but is not currently taking any rate controlling medications. On 03/20/2018 patient had successful a-flutter radiofrequency ablation treatment - on continuous cardiac monitoring.  HTN- hypotensive on admission.patient denies having HTN but it is noted in cardiology notes. Previously controlled per outpt notes - holding furosemide in setting of sepsis and fluid rehydration  CAD-cath 12/16 distalleft circumflex 45% stenosis. No chest pain on admission.Last lipid panel 09/22/2015 LDL 116, Total cholesterol 187, HDL 60. On crestor 20mg  at home. - continue crestor,ASA   Obesity-Body mass index is 39.16 kg/m.Continue to encourage lifestyle modifications.   Fluids: none  . sodium chloride    . gentamicin 260 mg (11/05/18 0019)  . pencillin G potassium IV 4 Million Units (11/05/18 1308)   Electrolytes: replete PRn  Nutrition: heart healthy diet GI ppx: none DVT px: warfarin  Disposition: home   Medications: Scheduled Meds: . aspirin  81 mg Oral Daily  . rosuvastatin  20 mg Oral q1800  . Warfarin -  Pharmacist Dosing Inpatient   Does not apply q1800    Continuous Infusions: . sodium chloride    . gentamicin 260 mg (11/05/18 0019)  . pencillin G potassium IV 4 Million Units (11/05/18 0921)   PRN Meds: acetaminophen **OR** acetaminophen, polyethylene glycol  ================================================= ================================================= Subjective:  Patient feels great today.  stil l has no complaints.  He will like to know when he can leave.   Objective: Temp:  [97.8 F (36.6 C)-98.1 F (36.7 C)] 98 F (36.7 C) (12/08 0405) Pulse Rate:  [57-64] 57 (12/08 0405) Resp:  [16-18] 16 (12/08 0405) BP: (93-126)/(52-64) 93/61 (12/08 0405) SpO2:  [99 %-100 %] 99 % (12/08 0405) Weight:  [114.1 kg] 114.1 kg (12/08 0421) Intake/Output 12/07 0701 - 12/08 0700 In: 2191.5 [P.O.:600; IV Piggyback:1591.5] Out: 380 [Urine:380] Physical Exam:  Gen: NAD, alert, non-toxic, well-nourished, well-appearing, sitting comfortably  HEENT: Normocephaic, atraumatic. Clear conjuctiva, no scleral icterus and injection.  CV: Regular rate and rhythm. Mechanical click from valve appreciated.  Normal capillary refill bilaterally.  Radial pulses 2+ bilaterally. No bilateral lower extremity edema. Resp: Clear to auscultation bilaterally.  No wheezing, rales, abnormal lung sounds.  No increased work of breathing appreciated. Abd: Nontender and nondistended on palpation to all 4 quadrants.  Positive bowel sounds. Psych: Cooperative with exam. Pleasant. Makes eye contact. Extremities: Full ROM.  No swelling, no calf or popliteal tenderness to palpation.  Erythema of left leg has almost completely resolved.    Laboratory: Recent Labs  Lab 11/01/18 1551 11/02/18 0524  WBC 14.6* 12.1*  HGB 15.9 11.9*  HCT 51.6 37.2*  PLT 212 169   Recent Labs  Lab 11/01/18 1551 11/02/18 0524 11/03/18 0843 11/05/18 0336  NA 134* 135 135 136  K 4.5 3.9 4.1 4.2  CL 97* 105 105 102  CO2 23 21* 23 23  BUN 10 11 10 10   CREATININE 1.20 0.88 0.77 0.84   CALCIUM 9.5 8.2* 8.6* 8.7*  PROT 8.7*  --   --   --   BILITOT 1.5*  --   --   --   ALKPHOS 74  --   --   --   ALT 31  --   --   --   AST 54*  --   --   --   GLUCOSE 106* 102* 115* 94   Imaging/Diagnostic Tests: Vas [place] Lower Extremity Venous (dvt)  Result Date: 11/05/2018  Lower Venous Study Indications: Swelling, and history of DVT. Other Indications: Suspected DVT. Limitations: Body habitus and very poor visibility of calf veins. Performing Technologist: Lorina Rabon  Examination Guidelines: A complete evaluation includes B-mode imaging, spectral Doppler, color Doppler, and power Doppler as needed of all accessible portions of each vessel. Bilateral testing is considered an integral part of a complete examination. Limited examinations for reoccurring indications may be performed as noted.  Right Venous Findings: +---+---------------+---------+-----------+----------+-------+    CompressibilityPhasicitySpontaneityPropertiesSummary +---+---------------+---------+-----------+----------+-------+ CFVFull           Yes      Yes                          +---+---------------+---------+-----------+----------+-------+  Left Venous Findings: +---------+---------------+---------+-----------+----------+-------------------+          CompressibilityPhasicitySpontaneityPropertiesSummary             +---------+---------------+---------+-----------+----------+-------------------+ CFV      Full           Yes      Yes  POOR FLOW           +---------+---------------+---------+-----------+----------+-------------------+ SFJ      Full                                                             +---------+---------------+---------+-----------+----------+-------------------+ FV Prox  Full                                                             +---------+---------------+---------+-----------+----------+-------------------+ FV Mid   Full                                                              +---------+---------------+---------+-----------+----------+-------------------+ FV DistalFull                                                             +---------+---------------+---------+-----------+----------+-------------------+ PFV      Partial                                      Acute               +---------+---------------+---------+-----------+----------+-------------------+ POP      Full                    Yes                  dilated vein with                                                         poor flow           +---------+---------------+---------+-----------+----------+-------------------+ PTV                                                   poor visibility     +---------+---------------+---------+-----------+----------+-------------------+ PERO                                                  poor visibility     +---------+---------------+---------+-----------+----------+-------------------+    Summary: Right: No evidence of common femoral vein obstruction. Left: Findings consistent with acute deep vein thrombosis involving the left proximal profunda vein. No cystic structure found in the popliteal  fossa. Calf veins unable to see clearly, exam cannot completely exclude the possibility of thrombosis in calf veins.  *See table(s) above for measurements and observations. Electronically signed by Harold Barban MD on 11/05/2018 at 8:24:35 AM.    Final       Benay Pike, MD 11/05/2018, 9:33 AM PGY-1, [place] Intern pager: [phone], text pages welcome

## 2018-11-05 NOTE — Progress Notes (Signed)
Patient has home CPAP at bedside.  RT assistance not needed.

## 2018-11-05 NOTE — Progress Notes (Signed)
ANTICOAGULATION CONSULT NOTE - Follow-Up Consult  Pharmacy Consult for Warfarin Indication: h/o MVR, afib and h/o DVT  Patient Measurements: Height: 5\' 7"  (170.2 cm) Weight: 251 lb 8.7 oz (114.1 kg) IBW/kg (Calculated) : 66.1   Vital Signs: Temp: 98 F (36.7 C) (12/08 0405) Temp Source: Oral (12/08 0405) BP: 93/61 (12/08 0405) Pulse Rate: 57 (12/08 0405)  Labs: Recent Labs    11/03/18 0328 11/03/18 0843 11/04/18 0354 11/05/18 0336  LABPROT 26.2*  --  25.1* 28.1*  INR 2.44  --  2.32 2.67  CREATININE  --  0.77  --  0.84    Estimated Creatinine Clearance: 124.1 mL/min (by C-G formula based on SCr of 0.84 mg/dL).   Assessment: 50 YOM on warfarin PTA for hx mechanical MVR, Afib, and DVT on warfarin PTA.  Also noted dopplers showing new LLE DVT on 12/6. Admit INR 2.27 on PTA dose of warfarin is 10mg  M, Fr, and 7.5mg  all other day (held 12/2 for INR 3.6 and on 12/3 took 5mg ). Pharmacy consulted to resume warfarin dosing this admit.   INR today is back up into the therapeutic range (INR 2.67 << 2.32, goal of 2.5-3.5) - will discontinue the lovenox bridge. No CBC today - no bleeding noted.   Goal of Therapy:  INR 2.5-3.5 Monitor platelets by anticoagulation protocol: Yes   Plan:  - D/c Lovenox now that INR>/= 2.5 - Warfarin 10 mg x 1 dose at 1800 today - Will continue to monitor for any signs/symptoms of bleeding and will follow up with PT/INR in the a.m.   Thank you for allowing pharmacy to be a part of this patient's care.  Alycia Rossetti, PharmD, BCPS Clinical Pharmacist Pager: [phone] Clinical phone for 11/05/2018 from 7a-3:30p: [phone] If after 3:30p, please call main pharmacy at: [phone] Please check AMION for all [place] numbers 11/05/2018 9:44 AM

## 2018-11-05 NOTE — Discharge Summary (Signed)
[facility] Discharge Summary  Patient name: Oscar Brown Medical record number: [id number] Date of birth: 09-Oct-1966 Age: 52 y.o. Gender: male Date of Admission: 11/01/2018  Date of Discharge: 11/07/2018 Admitting Physician: Oscar Rio, MD  Primary Care Provider: Marton Redwood, MD Consultants: ID, CVTS  Indication for Hospitalization: LLE Cellulitis  Discharge Diagnoses/Problem List:  Patient Active Problem List   Diagnosis Date Noted  . Acute bacterial endocarditis   . H/O mitral valve replacement with mechanical valve   . Essential hypertension   . Sepsis due to cellulitis ([place]) 11/01/2018  . Coronary artery disease involving native coronary artery of native heart without angina pectoris 12/23/2016  . Long term (current) use of anticoagulants [Z79.01] 12/05/2015  . Atrial flutter ([place])   . History of cardiac arrest   . Incidental lung nodule, less than or equal to 73m 11/10/2015  . Tobacco use disorder 06/05/2015  . Severe mitral regurgitation 10/06/2012  . Preventative health care 10/06/2012  . Obesity, Class I, BMI 30-34.9 12/14/2006  . OSA (obstructive sleep apnea) 12/14/2006  . Chronic venous insufficiency 12/14/2006     Disposition: Home  Discharge Condition: Stable  Discharge Exam: Copied from progress note on the day of discharge: Objective: Temp:  [97.9 F (36.6 C)-98.3 F (36.8 C)] 98.3 F (36.8 C) (12/10 0452) Pulse Rate:  [54-58] 54 (12/10 0452) Resp:  [12-17] 12 (12/10 0452) BP: (96-105)/(60-65) 96/62 (12/10 0452) SpO2:  [98 %] 98 % (12/10 0452) Weight:  [112.6 kg] 112.6 kg (12/10 0452) Intake/Output 12/09 0701 - 12/10 0700 In: 120 [P.O.:120] Out: -  Physical Exam:  Copied from the resident note on the day of discharge Gen: NAD, alert, non-toxic, well-nourished, well-appearing, sitting comfortably  HEENT: Normocephaic, atraumatic. Clear conjuctiva, no scleral icterus and injection.  CV: Regular rate and  rhythm. Mechanical click from valve appreciated.  Normal capillary refill bilaterally.  Radial pulses 2+ bilaterally. No bilateral lower extremity edema. Resp: Clear to auscultation bilaterally.  No wheezing, rales, abnormal lung sounds.  No increased work of breathing appreciated. Abd: Nontender and nondistended on palpation to all 4 quadrants.  Positive bowel sounds. Psych: Cooperative with exam. Pleasant. Makes eye contact. Extremities: Full ROM.  No swelling, no calf or popliteal tenderness to palpation.  Erythema of left leg has almost completely resolved.   Brief Hospital Course:  [name] a 52year old with hx of mechanical mitral valve (10/2015) on warfarin, OSA, COPD and CHF. He presented inially meeting sepsis criteria with likely source left lower extremity cellulitis. It was an unusual cellulitis picture because he had a recent RLE cellulitis in the past week for which he was treated outpatient with doxycycline. Patient was given IV fluids and started on broad spectrum IV antibiotics. Blood cultures resulted with Streptococcus agalactiae and abx were narrowed. TEE demonstrated a 12x555mvegetation on the prosthetic mitral valve. CVTS  Was consulted and patient was not thought to need surgical intervention. ID was consulted for antibiotic management. Repeat inpatient blood cultures were negative.  Of note, patient also got a LE ultrasound which demonstrated a DVT of proximal profunda vein. This was thought to be a chronic clot. Hematology was curbsided and it was recommended to continue warfarin.   Issues for Follow Up:  1. Antibiotics: PICC was placed and patient was sent to complete a 6 week course of IV PCN and 2 week course of gentamicin. 2. Patient to follow up with ID in 4-5 weeks  For repeat TEE and BCX. Appt  made for 12-07-2018 at 10AM.  Significant Procedures: None  Significant Labs and Imaging:  Recent Labs  Lab 11/06/18 0312  WBC 4.1  HGB 12.6*  HCT 40.2  PLT 265    Recent Labs  Lab 11/05/18 0336 11/06/18 0312 11/07/18 0848  NA 136 137 138  K 4.2 4.4 4.4  CL 102 103 105  CO2 _0 GLUCOSE 94 108* 93  BUN _1 CREATININE 0.84 0.84 0.89  CALCIUM 8.7* 8.9 9.2    Results/Tests Pending at Time of Discharge: None  Discharge Medications:  Allergies as of 11/07/2018      Reactions   Iohexol Hives, Itching, Other (See Comments)   Patient needs 13 hour prep per Dr. Pascal Lux, after cath he broke out in hives and had itching       Medication List    STOP taking these medications   doxycycline 100 MG tablet Commonly known as:  VIBRA-TABS     TAKE these medications   aspirin 81 MG EC tablet Commonly known as:  ASPIRIN 81 Take 1 tablet (81 mg total) by mouth daily. Swallow whole.   furosemide 40 MG tablet Commonly known as:  LASIX One tab twice a week. What changed:    how much to take  how to take this  when to take this  additional instructions   gentamicin  IVPB Commonly known as:  GARAMYCIN Inject 260 mg into the vein daily for 11 days. Indication:  Strep prosthetic valve endocarditis Last Day of Therapy:  11/17/18 Labs - Sunday/Monday:  CBC/D, BMP, and gentamicin trough. Labs - Thursday:  BMP and gentamicin trough Labs - Every other week:  ESR and CRP Notes to patient:  Take as ordered. Follow instructions listed above.    penicillin G  IVPB Inject 24 Million Units into the vein daily. As a continuous infusion Indication:  Strep prosthetic valve endocarditis Last Day of Therapy:  12/17/18 Labs - Once weekly:  CBC/D and BMP, Labs - Every other week:  ESR and CRP Notes to patient:  Follow instructions listed above.    rosuvastatin 20 MG tablet Commonly known as:  CRESTOR Take 20 mg by mouth daily.   warfarin 5 MG tablet Commonly known as:  COUMADIN Take as directed. If you are unsure how to take this medication, talk to your nurse or doctor. Original instructions:  TAKE 1 tablet daily until you can follow up  with your coumadin clinic. What changed:  additional instructions            Home Infusion Instuctions  (From admission, onward)         Start     Ordered   11/07/18 0000  Home infusion instructions [facility] May follow [facility] Dosing Protocol; May administer Cathflo as needed to maintain patency of vascular access device.; Flushing of vascular access device: per [facility] Protocol: 0.9% NaCl pre/post medica...    Question Answer Comment  Instructions May follow [facility] Dosing Protocol   Instructions May administer Cathflo as needed to maintain patency of vascular access device.   Instructions Flushing of vascular access device: per [facility] Protocol: 0.9% NaCl pre/post medication administration and prn patency; Heparin 100 u/ml, 55m for implanted ports and Heparin 10u/ml, 541mfor all other central venous catheters.   Instructions May follow [facility] Anaphylaxis Protocol for First Dose Administration in the home: 0.9% NaCl at 25-50 ml/hr to maintain IV access for protocol meds. Epinephrine 0.3 ml IV/IM PRN and Benadryl 25-50  IV/IM PRN s/s of anaphylaxis.   Instructions [facility] Infusion Coordinator (RN) to assist per patient IV care needs in the home PRN.      11/07/18 1454          Discharge Instructions: Please refer to Patient Instructions section of EMR for full details.  Patient was counseled important signs and symptoms that should prompt return to medical care, changes in medications, dietary instructions, activity restrictions, and follow up appointments.   Follow-Up Appointments: Follow-up Information    Magnolia Callas, NP Follow up.   Specialty:  Infectious Diseases Why:  December 07, 2018 at 10am. If you are not able to make this appointment please call our office to schedule.  Contact information: [place] [address] [phone]        [facility]-Home Follow up.   Specialty:  Home Health Services Why:  home  healthservices arranged Contact information: [address] [place] [address] [phone]        [facility] - Dme .   Contact information: [place] [address] [phone]        Oscar Redwood, MD Follow up.   Specialty:  Internal Medicine Why:  please try to follow up with your primary care doctor within one week of discharge.  Contact information: [place] [address] [phone]           Everrett Coombe, MD 11/10/2018, 6:18 PM PGY-3, [place]

## 2018-11-06 ENCOUNTER — Encounter (HOSPITAL_COMMUNITY): Payer: Self-pay | Admitting: Cardiology

## 2018-11-06 ENCOUNTER — Inpatient Hospital Stay: Payer: Self-pay

## 2018-11-06 DIAGNOSIS — Z91041 Radiographic dye allergy status: Secondary | ICD-10-CM

## 2018-11-06 DIAGNOSIS — Z952 Presence of prosthetic heart valve: Secondary | ICD-10-CM

## 2018-11-06 DIAGNOSIS — B954 Other streptococcus as the cause of diseases classified elsewhere: Secondary | ICD-10-CM

## 2018-11-06 DIAGNOSIS — T826XXA Infection and inflammatory reaction due to cardiac valve prosthesis, initial encounter: Principal | ICD-10-CM

## 2018-11-06 LAB — CBC WITH DIFFERENTIAL/PLATELET
Band Neutrophils: 0 %
Basophils Absolute: 0.1 10*3/uL (ref 0.0–0.1)
Basophils Relative: 2 %
Blasts: 0 %
EOS ABS: 0.1 10*3/uL (ref 0.0–0.5)
Eosinophils Relative: 3 %
HEMATOCRIT: 40.2 % (ref 39.0–52.0)
Hemoglobin: 12.6 g/dL — ABNORMAL LOW (ref 13.0–17.0)
LYMPHS PCT: 34 %
Lymphs Abs: 1.4 10*3/uL (ref 0.7–4.0)
MCH: 27.6 pg (ref 26.0–34.0)
MCHC: 31.3 g/dL (ref 30.0–36.0)
MCV: 88 fL (ref 80.0–100.0)
Metamyelocytes Relative: 0 %
Monocytes Absolute: 0.4 10*3/uL (ref 0.1–1.0)
Monocytes Relative: 9 %
Myelocytes: 0 %
Neutro Abs: 2.1 10*3/uL (ref 1.7–7.7)
Neutrophils Relative %: 52 %
OTHER: 0 %
Platelets: 265 10*3/uL (ref 150–400)
Promyelocytes Relative: 0 %
RBC: 4.57 MIL/uL (ref 4.22–5.81)
RDW: 13.6 % (ref 11.5–15.5)
WBC: 4.1 10*3/uL (ref 4.0–10.5)
nRBC: 0 % (ref 0.0–0.2)
nRBC: 0 /100 WBC

## 2018-11-06 LAB — BASIC METABOLIC PANEL
Anion gap: 9 (ref 5–15)
BUN: 11 mg/dL (ref 6–20)
CO2: 25 mmol/L (ref 22–32)
Calcium: 8.9 mg/dL (ref 8.9–10.3)
Chloride: 103 mmol/L (ref 98–111)
Creatinine, Ser: 0.84 mg/dL (ref 0.61–1.24)
GFR calc Af Amer: >60 mL/min (ref >60)
GFR calc non Af Amer: >60 mL/min (ref >60)
Glucose, Bld: 108 mg/dL — ABNORMAL HIGH (ref 70–99)
POTASSIUM: 4.4 mmol/L (ref 3.5–5.1)
Sodium: 137 mmol/L (ref 135–145)

## 2018-11-06 LAB — PROTIME-INR
INR: 3.17
Prothrombin Time: 32 seconds — ABNORMAL HIGH (ref 11.4–15.2)

## 2018-11-06 MED ORDER — WARFARIN SODIUM 7.5 MG PO TABS
7.5000 mg | ORAL_TABLET | Freq: Once | ORAL | Status: AC
  Administered 2018-11-06: 7.5 mg via ORAL
  Filled 2018-11-06: qty 1

## 2018-11-06 NOTE — Progress Notes (Signed)
Pharmacy Antibiotic Note  Oscar Brown is a 52 y.o. male admitted on 11/01/2018 with GBS bacteremia and prosthetic valve endocarditis. Pharmacy has been consulted for Gentamicin synergy dosing in addition to Penicillin G per MD.   The patient continues on Gentamicin 3 mg/kg/day using the patient's adjusted body weight - dose remains appropriate. SCr 0.84, CrCl~80-100 ml/min - will monitor closely while on Gentamicin.  ID planning on using Gentamicin for synergy for 2 weeks. Started on 12/7 - intended stop date for 12/20.  Peak goal = 3-4 mg/L Trough goal < 1 mg/L  Plan: - Continue Gentamicin 240 mg IV every 24 hours - Continue Penicillin G 4 MU every 4 hours per MD - Will continue to follow renal function  Height: 5\' 7"  (170.2 cm) Weight: 244 lb 4.3 oz (110.8 kg) IBW/kg (Calculated) : 66.1  Temp (24hrs), Avg:97.7 F (36.5 C), Min:97.2 F (36.2 C), Max:98.2 F (36.8 C)  Recent Labs  Lab 11/01/18 1551 11/01/18 1558 11/01/18 1825 11/01/18 2140 11/02/18 0524 11/03/18 0843 11/05/18 0336 11/06/18 0312  WBC 14.6*  --   --   --  12.1*  --   --  4.1  CREATININE 1.20  --   --   --  0.88 0.77 0.84 0.84  LATICACIDVEN  --  2.28* 1.2 1.5  --   --   --   --     Estimated Creatinine Clearance: 122.2 mL/min (by C-G formula based on SCr of 0.84 mg/dL).    Allergies  Allergen Reactions  . Iohexol Hives, Itching and Other (See Comments)    Patient needs 13 hour prep per Dr. Pascal Lux, after cath he broke out in hives and had itching     Antimicrobials this admission: Vanco 12/4>>12/5 Cefepime 12/4>>12/5 Pencillin G 12/5>> Gentamicin 12/6 >> (12/20)  Microbiology results: 12/4 Blood >> 3/4 Strep agalactiae (GBS) (pan-S except R-erythromycin) 12/7 BCx >> ngtd  Thank you for allowing pharmacy to be a part of this patient's care.  Alycia Rossetti, PharmD, BCPS Clinical Pharmacist Pager: [phone] Clinical phone for 11/06/2018 from 7a-3:30p: [phone] If after 3:30p, please call  main pharmacy at: [phone] Please check AMION for all [place] numbers 11/06/2018 11:15 AM

## 2018-11-06 NOTE — Progress Notes (Addendum)
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 To discharging provider:  please sign these orders via discharge navigator,  Select New Orders & click on the button choice - Manage This Unsigned Work.     Thank you for allowing pharmacy to be a part of this patient's care.  Jimmy Footman, PharmD, BCPS, BCIDP Infectious Diseases Clinical Pharmacist Phone: [phone] 11/06/2018, 3:43 PM

## 2018-11-06 NOTE — Progress Notes (Addendum)
Family Medicine Teaching Service Daily Progress Note Intern Pager: [phone]  Patient name: Oscar Brown Medical record number: [id number] Date of birth: July 13, 1966 Age: 52 y.o. Gender: male  Primary Care Provider: Marton Redwood, MD Consultants: ID, CVTS, cardiology Code Status: Full code  Pt Overview and Major Events to Date:  Hospital Day 5 Admitted: 11/01/2018  Assessment and Plan: Ashkan Chamberland Carteris a 52 y.o.malepresenting with rash on left leg, fever, and a presyncopal episode concerning for sepsis and found to have cellulitis and endocarditis. PMH is significant for mitral valve replacement on warfarin, OSA, COPD, CHF.   Sepsiswith strep agalactieae bacteremia, endocarditis of mechanical valve, and cellulitis. (sepsis resolved) - patient developed left leg cellulitis while being treated with Abx for cellulitis of the R. Leg.  Blood Cx drawn and Broad Spec Abx started.  Patient no longer septic. Strep agalactiae detected on 3/4 blood Cx . Today patient is feeling well with no complaints and normal physical exam.. Cellulitis improving in appearance, almost completely resolved. .  Patient on penicillin G 58m q4h and gentamycin. TEE showed vegetation on mechanical valve. CVTS consulted and will not do surgery.  ID consulted, appreciate recs. Awaiting ID recs on  PICC line placement.   - penicillin G 6 weeks, per ID - gentamycin 2 weeks, per ID.    - cardiac monitoring - CBC, BMP - monitor cellulitis margins.  - f/u repeat Blood Cx: NG1D - ask CVTS about activity restrictions  - ask IV team - can he work with a picc?  DVT of left proximal profunda vein - patient has been therapeutic on warfarin for months. Possibly a chronic clot.  Talked with heme over the weekend and he does not need to have additional anticoagulation for vein as warfarin is the standard anticoagulation for mechanical valve and NOACs are not proven in that situation.  Current therapeutic range of 2.5-3.5 is  sufficient.  - continue warfarin.   Mitral regurgitations/p Mitral valvereplacement/systolic heart failure-Patient follows with heart failure clinic. 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 In the post-op period he experienced a prolonged VF arrest and subsequently was noted to have EF 35-45% which subsequently improved.ECHO on 01/2018 shows EF 55%, D shaped interventricular septum suggestive of RV overload, mechanical mitral valve.Takes furosemide 40mg  twice a week on Monday and Friday. I/O: 2600 input, 2000 output.   - holding furosemide -monitor fluid status closely, strict I/O.  -on warfarin at home.INR on admission 2.27>2.87>2.44> 2.32> 2.67 >3.17  -continuewarfarin. Pharmacy to dose. - daily INR - monitor lung exam for signs of hypervolemia while we are fluid resuscitating for sepsis management  COPD- reported history of COPD. Patient is not taking medications for this.Lungs clear on exam, O2 saturation WNL.  OSA-patient reports wearingcpap every night. - continue CPAP.  Atrial flutter, historical, resolved-patient had reported history of afib/flutter before his heart valve replacement but is not currently taking any rate controlling medications. On 03/20/2018 patient had successful a-flutter radiofrequency ablation treatment - on continuous cardiac monitoring.  HTN- hypotensive on admission.patient denies having HTN but it is noted in cardiology notes. Previously controlled per outpt notes - holding furosemide in setting of sepsis and fluid rehydration  CAD-cath 12/16 distalleft circumflex 45% stenosis. No chest pain on admission.Last lipid panel 09/22/2015 LDL 116, Total cholesterol 187, HDL 60. On crestor 20mg  at home. - continue crestor,ASA   Obesity-Body mass index is 39.16 kg/m.Continue to encourage lifestyle modifications.   Fluids: none  . sodium chloride    .  gentamicin 260 mg (11/05/18  2104)  . pencillin G potassium IV 4 Million Units (11/06/18 0530)   Electrolytes: replete PRn  Nutrition: heart healthy diet GI ppx: none DVT px: warfarin  Disposition: home   Medications: Scheduled Meds: . aspirin  81 mg Oral Daily  . rosuvastatin  20 mg Oral q1800  . Warfarin - Pharmacist Dosing Inpatient   Does not apply q1800   Continuous Infusions: . sodium chloride    . gentamicin 260 mg (11/05/18 2104)  . pencillin G potassium IV 4 Million Units (11/06/18 0530)   PRN Meds: acetaminophen **OR** acetaminophen, polyethylene glycol  ================================================= ================================================= Subjective:  Patient feels great today.  stil l has no complaints. I told him I would find out if he could work with a picc line in place, as he was curious yesterday. .   Objective: Temp:  [97.2 F (36.2 C)-98.2 F (36.8 C)] 97.2 F (36.2 C) (12/09 0517) Pulse Rate:  [55-61] 55 (12/09 0517) Resp:  [18] 18 (12/09 0517) BP: (97-108)/(56-73) 97/56 (12/09 0517) SpO2:  [98 %-100 %] 100 % (12/09 0517) Weight:  [110.8 kg] 110.8 kg (12/09 0500) Intake/Output No intake/output data recorded. Physical Exam:  Gen: NAD, alert, non-toxic, well-nourished, well-appearing, sitting comfortably  HEENT: Normocephaic, atraumatic. Clear conjuctiva, no scleral icterus and injection.  CV: Regular rate and rhythm. Mechanical click from valve appreciated.  Normal capillary refill bilaterally.  Radial pulses 2+ bilaterally. No bilateral lower extremity edema. Resp: Clear to auscultation bilaterally.  No wheezing, rales, abnormal lung sounds.  No increased work of breathing appreciated. Abd: Nontender and nondistended on palpation to all 4 quadrants.  Positive bowel sounds. Psych: Cooperative with exam. Pleasant. Makes eye contact. Extremities: Full ROM.  No swelling, no calf or popliteal tenderness to palpation.  Erythema of left leg has almost completely resolved.     Laboratory: Recent Labs  Lab 11/01/18 1551 11/02/18 0524 11/06/18 0312  WBC 14.6* 12.1* 4.1  HGB 15.9 11.9* 12.6*  HCT 51.6 37.2* 40.2  PLT 212 169 265   Recent Labs  Lab 11/01/18 1551  11/03/18 0843 11/05/18 0336 11/06/18 0312  NA 134*   < > 135 136 137  K 4.5   < > 4.1 4.2 4.4  CL 97*   < > 105 102 103  CO2 23   < > 23 23 25   BUN 10   < > 10 10 11   CREATININE 1.20   < > 0.77 0.84 0.84  CALCIUM 9.5   < > 8.6* 8.7* 8.9  PROT 8.7*  --   --   --   --   BILITOT 1.5*  --   --   --   --   ALKPHOS 74  --   --   --   --   ALT 31  --   --   --   --   AST 54*  --   --   --   --   GLUCOSE 106*   < > 115* 94 108*   < > = values in this interval not displayed.   Imaging/Diagnostic Tests: No results found.    Benay Pike, MD 11/06/2018, 6:11 AM PGY-1, [place] Intern pager: [phone], text pages welcome

## 2018-11-06 NOTE — Progress Notes (Signed)
ANTICOAGULATION CONSULT NOTE - Follow-Up Consult  Pharmacy Consult for Warfarin Indication: h/o MVR, afib and h/o DVT  Patient Measurements: Height: 5\' 7"  (170.2 cm) Weight: 244 lb 4.3 oz (110.8 kg) IBW/kg (Calculated) : 66.1   Vital Signs: Temp: 97.2 F (36.2 C) (12/09 0517) Temp Source: Oral (12/09 0517) BP: 97/56 (12/09 0517) Pulse Rate: 55 (12/09 0517)  Labs: Recent Labs    11/04/18 0354 11/05/18 0336 11/06/18 0312  HGB  --   --  12.6*  HCT  --   --  40.2  PLT  --   --  265  LABPROT 25.1* 28.1* 32.0*  INR 2.32 2.67 3.17  CREATININE  --  0.84 0.84    Estimated Creatinine Clearance: 122.2 mL/min (by C-G formula based on SCr of 0.84 mg/dL).   Assessment: 45 YOM on warfarin PTA for hx mechanical MVR, Afib, and DVT on warfarin PTA.  Also noted dopplers showing new LLE DVT on 12/6. Admit INR 2.27 on PTA dose of warfarin is 10mg  M, Fr, and 7.5mg  all other day (held 12/2 for INR 3.6 and on 12/3 took 5mg ). Pharmacy consulted to resume warfarin dosing this admit.   INR today remains in the therapeutic range (INR 3.17 << 2.67, goal of 2.5-3.5). Hgb 12.6, plts wnl - no bleeding noted.   Goal of Therapy:  INR 2.5-3.5 Monitor platelets by anticoagulation protocol: Yes   Plan:  - Warfarin 7.5 mg x 1 dose at 1800 today - Will continue to monitor for any signs/symptoms of bleeding and will follow up with PT/INR in the a.m.   Thank you for allowing pharmacy to be a part of this patient's care.  Alycia Rossetti, PharmD, BCPS Clinical Pharmacist Pager: [phone] Clinical phone for 11/06/2018 from 7a-3:30p: [phone] If after 3:30p, please call main pharmacy at: [phone] Please check AMION for all [place] numbers 11/06/2018 11:09 AM

## 2018-11-06 NOTE — Progress Notes (Signed)
INFECTIOUS DISEASE PROGRESS NOTE  ID: Oscar Brown is a 52 y.o. male with  Active Problems:   OSA (obstructive sleep apnea)   Sepsis due to cellulitis [facility])   H/O mitral valve replacement with mechanical valve   Essential hypertension   Acute bacterial endocarditis  Subjective: Feels better, still some LE edema.   Abtx:  Anti-infectives (From admission, onward)   Start     Dose/Rate Route Frequency Ordered Stop   11/03/18 1830  gentamicin (GARAMYCIN) 260 mg in dextrose 5 % 100 mL IVPB     3 mg/kg  85.6 kg (Adjusted) 106.5 mL/hr over 60 Minutes Intravenous Every 24 hours 11/03/18 1752     11/02/18 1900  vancomycin (VANCOCIN) IVPB 750 mg/150 ml premix  Status:  Discontinued     750 mg 150 mL/hr over 60 Minutes Intravenous Every 12 hours 11/01/18 1808 11/02/18 1103   11/02/18 1200  penicillin G potassium 4 Million Units in dextrose 5 % 250 mL IVPB     4 Million Units 250 mL/hr over 60 Minutes Intravenous Every 4 hours 11/02/18 1103     11/02/18 0445  ceFEPIme (MAXIPIME) 2 g in sodium chloride 0.9 % 100 mL IVPB  Status:  Discontinued     2 g 200 mL/hr over 30 Minutes Intravenous Every 12 hours 11/01/18 1724 11/02/18 1103   11/01/18 1615  vancomycin (VANCOCIN) 2,000 mg in sodium chloride 0.9 % 500 mL IVPB     2,000 mg 250 mL/hr over 120 Minutes Intravenous  Once 11/01/18 1614 11/01/18 1950   11/01/18 1615  ceFEPIme (MAXIPIME) 2 g in sodium chloride 0.9 % 100 mL IVPB     2 g 200 mL/hr over 30 Minutes Intravenous  Once 11/01/18 1614 11/01/18 1716   11/01/18 1600  cefTRIAXone (ROCEPHIN) 2 g in sodium chloride 0.9 % 100 mL IVPB  Status:  Discontinued     2 g 200 mL/hr over 30 Minutes Intravenous Every 24 hours 11/01/18 1557 11/01/18 1602      Medications:  Scheduled: . aspirin  81 mg Oral Daily  . rosuvastatin  20 mg Oral q1800  . warfarin  7.5 mg Oral ONCE-1800  . Warfarin - Pharmacist Dosing Inpatient   Does not apply q1800    Objective: Vital signs in last 24  hours: Temp:  [97.2 F (36.2 C)-98.2 F (36.8 C)] 97.9 F (36.6 C) (12/09 1441) Pulse Rate:  [55-58] 57 (12/09 1441) Resp:  [17-18] 17 (12/09 1441) BP: (97-108)/(56-70) 100/60 (12/09 1441) SpO2:  [98 %-100 %] 98 % (12/09 1441) Weight:  [110.8 kg] 110.8 kg (12/09 0500)   General appearance: alert, cooperative and no distress Resp: clear to auscultation bilaterally Cardio: regular rate and rhythm GI: normal findings: bowel sounds normal and soft, non-tender Extremities: edema trace BLE  Lab Results Recent Labs    11/05/18 0336 11/06/18 0312  WBC  --  4.1  HGB  --  12.6*  HCT  --  40.2  NA 136 137  K 4.2 4.4  CL 102 103  CO2 23 25  BUN 10 11  CREATININE 0.84 0.84   Liver Panel No results for input(s): PROT, ALBUMIN, AST, ALT, ALKPHOS, BILITOT, BILIDIR, IBILI in the last 72 hours. Sedimentation Rate No results for input(s): ESRSEDRATE in the last 72 hours. C-Reactive Protein No results for input(s): CRP in the last 72 hours.  Microbiology: Recent Results (from the past 240 hour(s))  Blood Culture (routine x 2)     Status: Abnormal   Collection  Time: 11/01/18  4:10 PM  Result Value Ref Range Status   Specimen Description BLOOD SITE NOT SPECIFIED  Final   Special Requests   Final    BOTTLES DRAWN AEROBIC AND ANAEROBIC Blood Culture adequate volume   Culture  Setup Time   Final    GRAM POSITIVE COCCI IN BOTH AEROBIC AND ANAEROBIC BOTTLES CRITICAL RESULT CALLED TO, READ BACK BY AND VERIFIED WITHAlona Bene PHARMD [id number] 11/02/18 A BROWNING Performed at [facility], [place] [address], [place], [place] [address]    Culture GROUP B STREP(S.AGALACTIAE)ISOLATED (A)  Final   Report Status 11/04/2018 FINAL  Final   Organism ID, Bacteria GROUP B STREP(S.AGALACTIAE)ISOLATED  Final      Susceptibility   Group b strep(s.agalactiae)isolated - MIC*    CLINDAMYCIN <=0.25 SENSITIVE Sensitive     AMPICILLIN <=0.25 SENSITIVE Sensitive     ERYTHROMYCIN 2 RESISTANT Resistant      VANCOMYCIN 0.5 SENSITIVE Sensitive     CEFTRIAXONE <=0.12 SENSITIVE Sensitive     LEVOFLOXACIN 1 SENSITIVE Sensitive     * GROUP B STREP(S.AGALACTIAE)ISOLATED  Blood Culture ID Panel (Reflexed)     Status: Abnormal   Collection Time: 11/01/18  4:10 PM  Result Value Ref Range Status   Enterococcus species NOT DETECTED NOT DETECTED Final   Listeria monocytogenes NOT DETECTED NOT DETECTED Final   Staphylococcus species NOT DETECTED NOT DETECTED Final   Staphylococcus aureus (BCID) NOT DETECTED NOT DETECTED Final   Streptococcus species DETECTED (A) NOT DETECTED Final    Comment: CRITICAL RESULT CALLED TO, READ BACK BY AND VERIFIED WITHAlona Bene PHARMD [id number] 11/02/18 A BROWNING    Streptococcus agalactiae DETECTED (A) NOT DETECTED Final    Comment: CRITICAL RESULT CALLED TO, READ BACK BY AND VERIFIED WITHAlona Bene PHARMD [id number] 11/02/18 A BROWNING    Streptococcus pneumoniae NOT DETECTED NOT DETECTED Final   Streptococcus pyogenes NOT DETECTED NOT DETECTED Final   Acinetobacter baumannii NOT DETECTED NOT DETECTED Final   Enterobacteriaceae species NOT DETECTED NOT DETECTED Final   Enterobacter cloacae complex NOT DETECTED NOT DETECTED Final   Escherichia coli NOT DETECTED NOT DETECTED Final   Klebsiella oxytoca NOT DETECTED NOT DETECTED Final   Klebsiella pneumoniae NOT DETECTED NOT DETECTED Final   Proteus species NOT DETECTED NOT DETECTED Final   Serratia marcescens NOT DETECTED NOT DETECTED Final   Haemophilus influenzae NOT DETECTED NOT DETECTED Final   Neisseria meningitidis NOT DETECTED NOT DETECTED Final   Pseudomonas aeruginosa NOT DETECTED NOT DETECTED Final   Candida albicans NOT DETECTED NOT DETECTED Final   Candida glabrata NOT DETECTED NOT DETECTED Final   Candida krusei NOT DETECTED NOT DETECTED Final   Candida parapsilosis NOT DETECTED NOT DETECTED Final   Candida tropicalis NOT DETECTED NOT DETECTED Final    Comment: Performed at [facility], [address],  [place], [place] [address]  Blood Culture (routine x 2)     Status: Abnormal   Collection Time: 11/01/18  4:30 PM  Result Value Ref Range Status   Specimen Description BLOOD RIGHT ANTECUBITAL  Final   Special Requests   Final    BOTTLES DRAWN AEROBIC AND ANAEROBIC Blood Culture adequate volume   Culture  Setup Time   Final    GRAM POSITIVE COCCI IN BOTH AEROBIC AND ANAEROBIC BOTTLES CRITICAL RESULT CALLED TO, READ BACK BY AND VERIFIED WITHAlona Bene PHARMD [id number] 11/02/18 A BROWNING    Culture (A)  Final    GROUP B STREP(S.AGALACTIAE)ISOLATED SUSCEPTIBILITIES  PERFORMED ON PREVIOUS CULTURE WITHIN THE LAST 5 DAYS. Performed at [facility], [place] [address] [place], [place] [address]    Report Status 11/04/2018 FINAL  Final  Culture, blood (Routine X 2) w Reflex to ID Panel     Status: None (Preliminary result)   Collection Time: 11/04/18 11:07 AM  Result Value Ref Range Status   Specimen Description BLOOD BLOOD LEFT HAND  Final   Special Requests AEROBIC BOTTLE ONLY Blood Culture adequate volume  Final   Culture   Final    NO GROWTH 2 DAYS Performed at [facility], [place] [address], [place], [place] [address]    Report Status PENDING  Incomplete  Culture, blood (Routine X 2) w Reflex to ID Panel     Status: None (Preliminary result)   Collection Time: 11/04/18 11:07 AM  Result Value Ref Range Status   Specimen Description BLOOD BLOOD RIGHT WRIST  Final   Special Requests   Final    AEROBIC BOTTLE ONLY Blood Culture results may not be optimal due to an inadequate volume of blood received in culture bottles   Culture   Final    NO GROWTH 2 DAYS Performed at [facility], [place] [address], [place], [place] [address]    Report Status PENDING  Incomplete    Studies/Results: No results found.   Assessment/Plan: PVE (MV) Strep bacteremia (GBS)  Total days of antibiotics: 2  His kidneys have tolerated the gent well so far.  Repeat BCx 12-7 are ngtd.  Plan for 2 weeks of  gent Plan for 6 weeks of IV penicillin  Will place [facility] Will place OPAT consult Will f/u in ID in 4-5 weeks (will need repeat TEE and repeat BCx)  Allergies  Allergen Reactions  . Iohexol Hives, Itching and Other (See Comments)    Patient needs 13 hour prep per Dr. Pascal Lux, after cath he broke out in hives and had itching     OPAT Orders Duration: 42 days End Date: 12-17-2018  [facility] Care Per Protocol:  Labs weekly while on IV antibiotics: _x_ CBC with differential __ BMP _x CMP __ CRP __ ESR __ Vancomycin trough __ CK  _x_ Please pull PIC at completion of IV antibiotics __ Please leave PIC in place until doctor has seen patient or been notified  Fax weekly labs to [phone]  Clinic Follow Up Appt: ID clinic 12-07-2018 Doren Custard) at [place]          Bobby Rumpf MD, FACP Infectious Diseases (pager) [phone] [email or web address] 11/06/2018, 3:30 PM  LOS: 5 days

## 2018-11-07 LAB — BASIC METABOLIC PANEL
Anion gap: 10 (ref 5–15)
BUN: 12 mg/dL (ref 6–20)
CHLORIDE: 105 mmol/L (ref 98–111)
CO2: 23 mmol/L (ref 22–32)
Calcium: 9.2 mg/dL (ref 8.9–10.3)
Creatinine, Ser: 0.89 mg/dL (ref 0.61–1.24)
GFR calc Af Amer: >60 mL/min (ref >60)
GFR calc non Af Amer: >60 mL/min (ref >60)
Glucose, Bld: 93 mg/dL (ref 70–99)
Potassium: 4.4 mmol/L (ref 3.5–5.1)
Sodium: 138 mmol/L (ref 135–145)

## 2018-11-07 LAB — PROTIME-INR
INR: 3.68
Prothrombin Time: 35.9 seconds — ABNORMAL HIGH (ref 11.4–15.2)

## 2018-11-07 MED ORDER — WARFARIN SODIUM 5 MG PO TABS: ORAL_TABLET | ORAL | 0 refills | Status: DC

## 2018-11-07 MED ORDER — GENTAMICIN IV (FOR PTA / DISCHARGE USE ONLY): 260.0000 mg | INTRAVENOUS | 0 refills | Status: AC

## 2018-11-07 MED ORDER — SODIUM CHLORIDE 0.9% FLUSH: 10.0000 mL | INTRAVENOUS | Status: DC | PRN

## 2018-11-07 MED ORDER — PENICILLIN G POTASSIUM IV (FOR PTA / DISCHARGE USE ONLY): 24.0000 10*6.[IU] | INTRAVENOUS | 0 refills | Status: AC

## 2018-11-07 MED ORDER — HEPARIN SOD (PORK) LOCK FLUSH 100 UNIT/ML IV SOLN
250.0000 [IU] | INTRAVENOUS | Status: AC | PRN
  Administered 2018-11-07: 250 [IU]

## 2018-11-07 NOTE — Care Management Note (Addendum)
Case Management Note  Patient Details  Name: Oscar Brown MRN: [id number] Date of Birth: 14-Sep-1966  Subjective/Objective:       Admitted with sepsis. Hx of mitral valve replacement on warfarin, OSA, COPD, CHF. Resides with fiance', Tiffany. PTA independent with ADL's, no DME usage.              Wylene Simmer (Sister) [place] (Daughter) Jonelle Sidle (fiance')    [phone]    PCP: Marton Redwood  Action/Plan: Transition to home with home health services to follow.... Pt will need LT IV ABX therapy, completes 12/20. [facility] will provide home health services.   Pt states has transportation to home.  Expected Discharge Date:   11/07/2018           Expected Discharge Plan:  [place]  In-House Referral:     Discharge planning Services  CM Consult  Post Acute Care Choice:    Choice offered to:  Patient  DME Arranged:  (IV abx therapy) DME Agency:  [place]:  RN [facility] Agency:  [place]  Status of Service:  Completed, signed off  If discussed at [place] of Stay Meetings, dates discussed:    Additional Comments:  Sharin Mons, RN 11/07/2018, 3:01 PM

## 2018-11-07 NOTE — Progress Notes (Signed)
Day shift RN reviewed dc instructions with the patient.  Pt discharged home with wife. PICC hep locked by IV team RN prior to DC. Belongings and medications with the patient.

## 2018-11-07 NOTE — Discharge Instructions (Signed)
Because you have a bacterial vegetation on your mechanical valve you will need to continue to take antibiotics for several more weeks.  You have no work or physical restrictions with this.  You will have to administer the antibiotics to yourself at home.  You will be taught how to do this before you leave.   Your INR levels were a little elevated, so I would like you to only take 5mg  of warfarin daily until you get your INR checked again at your regular coumadin clinic. Please schedule a follow up with them and your primary care doctor soon.    If you start to feel like you felt before you came into the hospital (weak, feverish, sweating, etc..) please come back to the hospital to be evaluated for infection.

## 2018-11-07 NOTE — Progress Notes (Signed)
ANTICOAGULATION CONSULT NOTE - Follow-Up Consult  Pharmacy Consult for Warfarin Indication: h/o MVR, afib and h/o DVT  Patient Measurements: Height: 5\' 7"  (170.2 cm) Weight: 248 lb 3.8 oz (112.6 kg) IBW/kg (Calculated) : 66.1   Vital Signs: Temp: 98.3 F (36.8 C) (12/10 0452) Temp Source: Oral (12/10 0452) BP: 96/62 (12/10 0452) Pulse Rate: 54 (12/10 0452)  Labs: Recent Labs    11/05/18 0336 11/06/18 0312 11/07/18 0415  HGB  --  12.6*  --   HCT  --  40.2  --   PLT  --  265  --   LABPROT 28.1* 32.0* 35.9*  INR 2.67 3.17 3.68  CREATININE 0.84 0.84  --     Estimated Creatinine Clearance: 123.2 mL/min (by C-G formula based on SCr of 0.84 mg/dL).   Assessment: 46 YOM on warfarin PTA for hx mechanical MVR, Afib, and DVT on warfarin PTA.  Also noted dopplers showing new LLE DVT on 12/6. Admit INR 2.27 on PTA dose of warfarin is 10mg  M/Fr, and 7.5mg  all other day (held 12/2 for INR 3.6 and on 12/3 took 5mg ). Pharmacy consulted to resume warfarin dosing this admit.   INR is supratherapeutic today at 3.68. Hgb 12.6, plts wnl - no bleeding noted.   Goal of Therapy:  INR 2.5-3.5 Monitor platelets by anticoagulation protocol: Yes   Plan:  - Hold warfarin tonight for high INR - Will continue to monitor for any signs/symptoms of bleeding and will follow up with PT/INR in the a.m.   Thank you for allowing pharmacy to be a part of this patient's care.  Renold Genta, PharmD, BCPS Clinical Pharmacist Clinical phone for 11/07/2018 until 3p is [phone] 11/07/2018 10:21 AM  **Pharmacist phone directory can now be found on [email or web address] listed under [place]**

## 2018-11-07 NOTE — Progress Notes (Signed)
[facility] Infusion Coordinator will follow pt with ID team to support home infusion pharmacy services at DC for home IVABX.  If patient discharges after hours, please call [phone].   Larry Sierras 11/07/2018, 8:18 AM

## 2018-11-07 NOTE — Progress Notes (Signed)
Family Medicine Teaching Service Daily Progress Note Intern Pager: [phone]  Patient name: Oscar Brown Medical record number: [id number] Date of birth: 1966/10/26 Age: 52 y.o. Gender: male  Primary Care Provider: Marton Redwood, MD Consultants: ID, CVTS, cardiology Code Status: Full code  Pt Overview and Major Events to Date:  Hospital Day 6 Admitted: 11/01/2018  Assessment and Plan: Sabir Charters Carteris a 52 y.o.malepresenting with rash on left leg, fever, and a presyncopal episode concerning for sepsis and found to have cellulitis and endocarditis. PMH is significant for mitral valve replacement on warfarin, OSA, COPD, CHF.    Sepsiswith strep agalactieae bacteremia, endocarditis of mechanical valve, and cellulitis. (sepsis resolved) - patient developed left leg cellulitis while being treated with Abx for cellulitis of the R. Leg.  Blood Cx drawn and Broad Spec Abx started.  Patient no longer septic. Strep agalactiae detected on 3/4 blood Cx . Today patient is feeling well with no complaints and normal physical exam.. Cellulitis improving in appearance, almost completely resolved. .  Patient on penicillin G 76m q4h and gentamycin. TEE showed vegetation on mechanical valve. CVTS consulted and will not do surgery.  ID consulted, appreciate recs. Awaiting ID recs on  PICC line placement.  - picc today.   - penicillin G 6 weeks, per ID - gentamycin 2 weeks, per ID.    - cardiac monitoring - CBC, BMP - monitor cellulitis margins.  - f/u repeat Blood Cx: NG2D  DVT of left proximal profunda vein - patient has been therapeutic on warfarin for months. Possibly a chronic clot.  Talked with heme over the weekend and he does not need to have additional anticoagulation for vein as warfarin is the standard anticoagulation for mechanical valve and NOACs are not proven in that situation.  Current therapeutic range of 2.5-3.5 is sufficient. Patient has INR of 3.68 today - continue warfarin.  Holding todays dose bc INR 3.68.  Will give patient 2.5mg  daily until he see's pcp in followup and gets additional INR.   Mitral regurgitations/p Mitral valvereplacement/systolic heart failure-Patient follows with heart failure clinic. 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 In the post-op period he experienced a prolonged VF arrest and subsequently was noted to have EF 35-45% which subsequently improved.ECHO on 01/2018 shows EF 55%, D shaped interventricular septum suggestive of RV overload, mechanical mitral valve.Takes furosemide 40mg  twice a week on Monday and Friday. I/O: 2600 input, 2000 output.   - holding furosemide -monitor fluid status closely, strict I/O.  -on warfarin at home.INR on admission 2.27>2.87>2.44> 2.32> 2.67 >3.17> 3.68 -continuewarfarin. Pharmacy to dose. - daily INR - monitor lung exam for signs of hypervolemia while we are fluid resuscitating for sepsis management  COPD- reported history of COPD. Patient is not taking medications for this.Lungs clear on exam, O2 saturation WNL.  OSA-patient reports wearingcpap every night. - continue CPAP.  Atrial flutter, historical, resolved-patient had reported history of afib/flutter before his heart valve replacement but is not currently taking any rate controlling medications. On 03/20/2018 patient had successful a-flutter radiofrequency ablation treatment - on continuous cardiac monitoring.  HTN- hypotensive on admission.patient denies having HTN but it is noted in cardiology notes. Previously controlled per outpt notes - holding furosemide in setting of sepsis and fluid rehydration  CAD-cath 12/16 distalleft circumflex 45% stenosis. No chest pain on admission.Last lipid panel 09/22/2015 LDL 116, Total cholesterol 187, HDL 60. On crestor 20mg  at home. - continue crestor,ASA   Obesity-Body mass index is 39.16 kg/m.Continue to  encourage lifestyle  modifications.   Fluids: none  . sodium chloride    . gentamicin 260 mg (11/06/18 2144)  . pencillin G potassium IV 4 Million Units (11/07/18 0428)   Electrolytes: replete PRn  Nutrition: heart healthy diet GI ppx: none DVT px: warfarin  Disposition: home   Medications: Scheduled Meds: . aspirin  81 mg Oral Daily  . rosuvastatin  20 mg Oral q1800  . Warfarin - Pharmacist Dosing Inpatient   Does not apply q1800   Continuous Infusions: . sodium chloride    . gentamicin 260 mg (11/06/18 2144)  . pencillin G potassium IV 4 Million Units (11/07/18 0428)   PRN Meds: acetaminophen **OR** acetaminophen, polyethylene glycol  ================================================= ================================================= Subjective:  Patient feels great today.  stil l has no complaints. No questions.    Objective: Temp:  [97.9 F (36.6 C)-98.3 F (36.8 C)] 98.3 F (36.8 C) (12/10 0452) Pulse Rate:  [54-58] 54 (12/10 0452) Resp:  [12-17] 12 (12/10 0452) BP: (96-105)/(60-65) 96/62 (12/10 0452) SpO2:  [98 %] 98 % (12/10 0452) Weight:  [112.6 kg] 112.6 kg (12/10 0452) Intake/Output 12/09 0701 - 12/10 0700 In: 120 [P.O.:120] Out: -  Physical Exam:  Gen: NAD, alert, non-toxic, well-nourished, well-appearing, sitting comfortably  HEENT: Normocephaic, atraumatic. Clear conjuctiva, no scleral icterus and injection.  CV: Regular rate and rhythm. Mechanical click from valve appreciated.  Normal capillary refill bilaterally.  Radial pulses 2+ bilaterally. No bilateral lower extremity edema. Resp: Clear to auscultation bilaterally.  No wheezing, rales, abnormal lung sounds.  No increased work of breathing appreciated. Abd: Nontender and nondistended on palpation to all 4 quadrants.  Positive bowel sounds. Psych: Cooperative with exam. Pleasant. Makes eye contact. Extremities: Full ROM.  No swelling, no calf or popliteal tenderness to palpation.  Erythema of left leg has almost  completely resolved.    Laboratory: Recent Labs  Lab 11/01/18 1551 11/02/18 0524 11/06/18 0312  WBC 14.6* 12.1* 4.1  HGB 15.9 11.9* 12.6*  HCT 51.6 37.2* 40.2  PLT 212 169 265   Recent Labs  Lab 11/01/18 1551  11/03/18 0843 11/05/18 0336 11/06/18 0312  NA 134*   < > 135 136 137  K 4.5   < > 4.1 4.2 4.4  CL 97*   < > 105 102 103  CO2 23   < > 23 23 25   BUN 10   < > 10 10 11   CREATININE 1.20   < > 0.77 0.84 0.84  CALCIUM 9.5   < > 8.6* 8.7* 8.9  PROT 8.7*  --   --   --   --   BILITOT 1.5*  --   --   --   --   ALKPHOS 74  --   --   --   --   ALT 31  --   --   --   --   AST 54*  --   --   --   --   GLUCOSE 106*   < > 115* 94 108*   < > = values in this interval not displayed.   Imaging/Diagnostic Tests: [place] Ekg Site Rite  Result Date: 11/06/2018 If Site Rite image not attached, placement could not be confirmed due to current cardiac rhythm.     Benay Pike, MD 11/07/2018, 6:23 AM PGY-1, [place] Intern pager: [phone], text pages welcome

## 2018-11-07 NOTE — Progress Notes (Signed)
Peripherally Inserted Central Catheter/Midline Placement  The IV Nurse has discussed with the patient and/or persons authorized to consent for the patient, the purpose of this procedure and the potential benefits and risks involved with this procedure.  The benefits include less needle sticks, lab draws from the catheter, and the patient may be discharged home with the catheter. Risks include, but not limited to, infection, bleeding, blood clot (thrombus formation), and puncture of an artery; nerve damage and irregular heartbeat and possibility to perform a PICC exchange if needed/ordered by physician.  Alternatives to this procedure were also discussed.  Bard Power PICC patient education guide, fact sheet on infection prevention and patient information card has been provided to patient /or left at bedside.    PICC/Midline Placement Documentation  PICC Double Lumen 11/07/18 PICC Right Brachial 38 cm 0 cm (Active)  Indication for Insertion or Continuance of Line Home intravenous therapies (PICC only) 11/07/2018  9:45 AM  Exposed Catheter (cm) 0 cm 11/07/2018  9:45 AM  Site Assessment Clean;Dry;Intact 11/07/2018  9:45 AM  Lumen #1 Status Flushed;Heparin locked (Peds);Saline locked 11/07/2018  9:45 AM  Lumen #2 Status Flushed;Blood return noted;Saline locked 11/07/2018  9:45 AM  Dressing Type Transparent 11/07/2018  9:45 AM  Dressing Status Dry;Clean;Intact 11/07/2018  9:45 AM  Dressing Change Due 11/14/18 11/07/2018  9:45 AM       Scotty Court 11/07/2018, 9:59 AM

## 2018-11-07 NOTE — Progress Notes (Signed)
Pt self administers CPAP. 

## 2018-11-09 LAB — CULTURE, BLOOD (ROUTINE X 2)
CULTURE: NO GROWTH
Culture: NO GROWTH
Special Requests: ADEQUATE

## 2018-11-27 ENCOUNTER — Ambulatory Visit (INDEPENDENT_AMBULATORY_CARE_PROVIDER_SITE_OTHER): Payer: Managed Care, Other (non HMO) | Admitting: Pharmacist Clinician (PhC)/ Clinical Pharmacy Specialist

## 2018-11-27 DIAGNOSIS — Z954 Presence of other heart-valve replacement: Secondary | ICD-10-CM

## 2018-11-27 DIAGNOSIS — Z7901 Long term (current) use of anticoagulants: Secondary | ICD-10-CM | POA: Diagnosis not present

## 2018-11-27 DIAGNOSIS — I4892 Unspecified atrial flutter: Secondary | ICD-10-CM

## 2018-11-27 LAB — POCT INR: INR: 1.6 — AB (ref 2.0–3.0)

## 2018-12-04 ENCOUNTER — Ambulatory Visit (INDEPENDENT_AMBULATORY_CARE_PROVIDER_SITE_OTHER): Payer: Managed Care, Other (non HMO) | Admitting: Pharmacist

## 2018-12-04 DIAGNOSIS — Z954 Presence of other heart-valve replacement: Secondary | ICD-10-CM

## 2018-12-04 DIAGNOSIS — Z7901 Long term (current) use of anticoagulants: Secondary | ICD-10-CM | POA: Diagnosis not present

## 2018-12-04 DIAGNOSIS — I4892 Unspecified atrial flutter: Secondary | ICD-10-CM

## 2018-12-04 LAB — POCT INR: INR: 2.8 (ref 2.0–3.0)

## 2018-12-06 DIAGNOSIS — Z5181 Encounter for therapeutic drug level monitoring: Secondary | ICD-10-CM | POA: Insufficient documentation

## 2018-12-06 NOTE — Assessment & Plan Note (Addendum)
TEE pending as detailed below. FU with Dr. Roxy Manns.

## 2018-12-06 NOTE — Assessment & Plan Note (Addendum)
Oscar Brown seems to be recovering well from group b streptococcal PVE. He tolerated dual beta-lactam + aminoglycoside therapy well and for full 14 day course and has continued to do well on PCN therapy. PICC looks good. No signs of relapsing infection or septic embolism complication. [facility] and has an appointment next week - will reach out to see if they can assist with repeating his TEE. Will continue antibiotics until repeated TEE to reassess vegetation of MV ring. He will return 7-10 days after stopping antibiotics to see how he is doing and repeat blood cultures at that time as well.

## 2018-12-06 NOTE — H&P (View-Only) (Signed)
c     Patient: Oscar Brown  DOB: 03/14/1966 MRN: [id number] PCP: Marton Redwood, MD  Referring Provider: HFU   Patient Active Problem List   Diagnosis Date Noted  . Medication monitoring encounter 12/06/2018  . Acute bacterial endocarditis   . H/O mitral valve replacement with mechanical valve   . Essential hypertension   . Sepsis due to cellulitis (Oscar Brown) 11/01/2018  . Coronary artery disease involving native coronary artery of native heart without angina pectoris 12/23/2016  . Long term (current) use of anticoagulants [Z79.01] 12/05/2015  . Atrial flutter (Oscar Brown)   . History of cardiac arrest   . Incidental lung nodule, less than or equal to 55m 11/10/2015  . Tobacco use disorder 06/05/2015  . Severe mitral regurgitation 10/06/2012  . Preventative health care 10/06/2012  . Obesity, Class I, BMI 30-34.9 12/14/2006  . OSA (obstructive sleep apnea) 12/14/2006  . Chronic venous insufficiency 12/14/2006     Subjective:  CC:  Brown follow up for streptococcal endocarditis of prosthetic mitral valve. Feeling great, "better and better every day."   Brief ID Hx:  Oscar Brown with pmhx notable for systolic heart failure s/p minimally invasive mitral valve replacement (bileaflet mechanical prosthetic valve -Oscar Brown 10-2015 to correct damage related to rheumatic heart disease. He had a complicated post op course with prolonged VF arrest and acute heart failure. Now follows with Dr. BHaroldine Lawsand [facility] clinic.   HPI:  [facility] several weeks of feeling poorly with fevers. He was found to have high fever, hypotension and left lower extremity cellulitis. Prior to admission he required treatment for RLE cellulitis with antibiotics and at baseline he struggles with lymphedema of the LLE. He was started on vancomycin + cefepime empirically for sepsis. Blood cultures 12-4 grew streptococcus agalactiae (group b strep) in 2/2  sets and antibiotics were narrowed to penicillin 24 million units Q24h alone. TEE was obtained on 12/06 revealing highly mobile echodensity attached to anterior portion of the sewing ring of the mechanical mitral valve measuring 12x5 mm. Gentamicin was added for recommended 14 days of synergistic therapy in accordance with strep PVE guidelines. Repeated blood cultures obtained on 12-7 were sterile. He was seen by Dr. HRoxan Hockeyin consultation on 12/07 and determined that no surgery would be required due to intact valvular function.  He was last seen on 12/09 by Dr. HJohnnye Simaand was tolerating gentamicin therapy well. He was discharged home on 11/05/18. GNorva Karvonenwas continued through 11/17/18 and his penicillin is scheduled to complete 6 weeks on 12/17/18.    HPI:  Since discharge Oscar Brown reports that he has continued to feel better and better and now thinks he is back to normal. He has a RUE PICC line in place continuously infusing PCN via pump and is without pain, drainage or erythema and is well maintained by Oscar Brown. No swelling or altered sensation in affected distal extremity. He has what he would consider baseline edema of the lower extremities (L>R) and uses compression therapy which he finds to be helpful. 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 Denies any headaches, vision changes, shortness of breath, abdominal/flank pain, SOB, DOE or orthopnea. Weighs himself at home regularly and has follow up with Dr. BClayborne Brown next week. No fevers, chills or  sweats. It sounds like he had a ulcer on his foot/toe from rubbing on shoes at some point that is healed now. No cellulitis noted or lower leg pain.   Review of Systems  Constitutional: Negative for chills and fever.  HENT: Negative for tinnitus.   Eyes: Negative for blurred vision and photophobia.    Respiratory: Negative for cough and sputum production.   Cardiovascular: Negative for chest pain.  Gastrointestinal: Negative for diarrhea, nausea and vomiting.  Genitourinary: Negative for dysuria.  Skin: Negative for rash.  Neurological: Negative for dizziness, tingling, focal weakness, weakness and headaches.  Psychiatric/Behavioral: The patient does not have insomnia.     Past Medical History:  Diagnosis Date  . Acute diastolic congestive heart failure ([place]) 09/21/2015   takes Lasix daily  . Acute renal failure ([facility])    ATN s/p cardiac arrest  . Atrial fibrillation ([place])   . 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  Complicated by chronic left lower extremity edema and occasional recurrent left lower extremity cellulitis   . COPD (chronic obstructive pulmonary disease) ([place])   . DVT (deep venous thrombosis) ([place]) early 80's   left leg  . HLD (hyperlipidemia)   . Incidental lung nodule, less than or equal to 71m 11/10/2015   3 mm nodule right lung  . Lymphedema early 883's . Mitral regurgitation 10/06/2012  . Obstructive sleep apnea 12/14/2006   AHI 19.2/hr, desaturation to 75% on Polysomnography 02/26/2005.  Uses 11 cm H2O nocturnal nasal CPAP with good symptom control.   . S/P minimally invasive mitral valve replacement with metallic valve 115/17/6160  33 mm Carbomedics Optiform bileaflet mechanical prosthesis placed via right mini-thoracotomy approach    Outpatient Medications Prior to Visit  Medication Sig Dispense Refill  . aspirin (ASPIR-81) 81 MG EC tablet Take 1 tablet (81 mg total) by mouth daily. Swallow whole. 30 tablet 12  . furosemide (LASIX) 40 MG tablet One tab twice a week. (Patient taking differently: Take 40 mg by mouth See admin instructions. Take 40 mg by mouth twice weekly on Monday and Friday) 10 tablet 6  . penicillin G IVPB Inject 24 Million Units into the vein daily. As a continuous infusion Indication:  Strep prosthetic  valve endocarditis Last Day of Therapy:  12/17/18 Labs - Once weekly:  CBC/D and BMP, Labs - Every other week:  ESR and CRP 41 Units 0  . rosuvastatin (CRESTOR) 20 MG tablet Take 20 mg by mouth daily.    .Marland Kitchenwarfarin (COUMADIN) 5 MG tablet TAKE 1 tablet daily until you can follow up with your coumadin clinic. 30 tablet 0   No facility-administered medications prior to visit.      Allergies  Allergen Reactions  . Iohexol Hives, Itching and Other (See Comments)    Patient needs 13 hour prep per Dr. WPascal Lux after cath he broke out in hives and had itching     Social History   Tobacco Use  . Smoking status: Former Smoker    Packs/day: 0.10    Years: 9.00    Pack years: 0.90    Types: Cigarettes    Last attempt to quit: 09/05/2012    Years since quitting: 6.2  . Smokeless tobacco: Never Used  Substance Use Topics  . Alcohol use: Yes    Alcohol/week: 0.0 standard drinks    Comment: 1 beer once a week or special occasions  . Drug use: No    Family History  Problem Relation Age  of Onset  . Cancer Mother        type unknown but had a colostomy bag  . Other Father        bone disease  . Diabetes Paternal Aunt     Objective:   Vitals:   12/07/18 1012  BP: 134/86  Pulse: 69  Temp: 97.9 F (36.6 C)  TempSrc: Oral  Weight: 247 lb (112 kg)   Body mass index is 38.69 kg/m.  Physical Exam Constitutional:      General: He is not in acute distress.    Appearance: Normal appearance. He is not ill-appearing.  HENT:     Mouth/Throat:     Mouth: Mucous membranes are moist.     Pharynx: Oropharynx is clear.  Cardiovascular:     Rate and Rhythm: Normal rate and regular rhythm.     Heart sounds: No murmur. No gallop.      Comments: Mechanical click Pulmonary:     Effort: Pulmonary effort is normal. No respiratory distress.     Breath sounds: Normal breath sounds.  Abdominal:     General: There is no distension.     Palpations: Abdomen is soft.     Tenderness: There is no  abdominal tenderness.  Musculoskeletal:        General: Swelling (L>R, compression stockings in place) present.     Comments: RUE PICC line - clean/dry dressing. Insertion site w/o erythema, tenderness, drainage, cording or distal swelling of affected extremity   Skin:    General: Skin is warm and dry.     Capillary Refill: Capillary refill takes less than 2 seconds.  Neurological:     General: No focal deficit present.     Mental Status: He is alert and oriented to person, place, and time.  Psychiatric:        Mood and Affect: Mood normal.     Lab Results: Lab Results  Component Value Date   WBC 4.1 11/06/2018   HGB 12.6 (L) 11/06/2018   HCT 40.2 11/06/2018   MCV 88.0 11/06/2018   PLT 265 11/06/2018    Lab Results  Component Value Date   CREATININE 0.89 11/07/2018   BUN 12 11/07/2018   NA 138 11/07/2018   K 4.4 11/07/2018   CL 105 11/07/2018   CO2 23 11/07/2018    Lab Results  Component Value Date   ALT 31 11/01/2018   AST 54 (H) 11/01/2018   ALKPHOS 74 11/01/2018   BILITOT 1.5 (H) 11/01/2018     Assessment & Plan:   Problem List Items Addressed This Visit      Unprioritized   Sepsis due to cellulitis Mccamey Brown)    Resolved - ?referral to lymphedema clinic for assistance in management. Counseled on preventative tactics to reduce recurrence of cellulitis. He would likely benefit from on demand therapy with penicillin so he can stay on top of recurrent episodes. Will discuss at upcoming appointment.       H/O mitral valve replacement with mechanical valve    TEE pending as detailed below. FU with Dr. Roxy Brown.       Acute bacterial endocarditis - Primary    [facility] seems to be recovering well from group b streptococcal PVE. He tolerated dual beta-lactam + aminoglycoside therapy well and for full 14 day course and has continued to do well on PCN therapy. PICC looks good. No signs of relapsing infection or septic embolism complication. [facility]  and has an appointment next  week - will reach out to see if they can assist with repeating his TEE. Will continue antibiotics until repeated TEE to reassess vegetation of MV ring. He will return 7-10 days after stopping antibiotics to see how he is doing and repeat blood cultures at that time as well.       Medication monitoring encounter    All OPAT lab work reviewed and within normal limits. Creatinine stable with labs 12/16 @ 0.9.         Return in about 18 days (around 12/25/2018) for follow up.   Janene Madeira, MSN, NP-C [facility] [place] Pager: [phone] Office: [phone]  12/07/18  1:27 PM

## 2018-12-06 NOTE — Assessment & Plan Note (Addendum)
Resolved - ?referral to lymphedema clinic for assistance in management. Counseled on preventative tactics to reduce recurrence of cellulitis. He would likely benefit from on demand therapy with penicillin so he can stay on top of recurrent episodes. Will discuss at upcoming appointment.

## 2018-12-06 NOTE — Progress Notes (Signed)
c     Patient: Oscar Brown  DOB: 08-31-1966 MRN: [id number] PCP: Marton Redwood, MD  Referring Provider: HFU   Patient Active Problem List   Diagnosis Date Noted  . Medication monitoring encounter 12/06/2018  . Acute bacterial endocarditis   . H/O mitral valve replacement with mechanical valve   . Essential hypertension   . Sepsis due to cellulitis ([place]) 11/01/2018  . Coronary artery disease involving native coronary artery of native heart without angina pectoris 12/23/2016  . Long term (current) use of anticoagulants [Z79.01] 12/05/2015  . Atrial flutter ([place])   . History of cardiac arrest   . Incidental lung nodule, less than or equal to 4m 11/10/2015  . Tobacco use disorder 06/05/2015  . Severe mitral regurgitation 10/06/2012  . Preventative health care 10/06/2012  . Obesity, Class I, BMI 30-34.9 12/14/2006  . OSA (obstructive sleep apnea) 12/14/2006  . Chronic venous insufficiency 12/14/2006     Subjective:  CC:  Hospital follow up for streptococcal endocarditis of prosthetic mitral valve. Feeling great, "better and better every day."   Brief ID Hx:  Oscar DUBEAUis a 53y.o. male with pmhx notable for systolic heart failure s/p minimally invasive mitral valve replacement (bileaflet mechanical prosthetic valve -Roxy Manns 10-2015 to correct damage related to rheumatic heart disease. He had a complicated post op course with prolonged VF arrest and acute heart failure. Now follows with Dr. BHaroldine Lawsand AHF clinic.   HPI:  [facility] several weeks of feeling poorly with fevers. He was found to have high fever, hypotension and left lower extremity cellulitis. Prior to admission he required treatment for RLE cellulitis with antibiotics and at baseline he struggles with lymphedema of the LLE. He was started on vancomycin + cefepime empirically for sepsis. Blood cultures 12-4 grew streptococcus agalactiae (group b strep) in 2/2  sets and antibiotics were narrowed to penicillin 24 million units Q24h alone. TEE was obtained on 12/06 revealing highly mobile echodensity attached to anterior portion of the sewing ring of the mechanical mitral valve measuring 12x5 mm. Gentamicin was added for recommended 14 days of synergistic therapy in accordance with strep PVE guidelines. Repeated blood cultures obtained on 12-7 were sterile. He was seen by Dr. HRoxan Hockeyin consultation on 12/07 and determined that no surgery would be required due to intact valvular function.  He was last seen on 12/09 by Dr. HJohnnye Simaand was tolerating gentamicin therapy well. He was discharged home on 11/05/18. GNorva Karvonenwas continued through 11/17/18 and his penicillin is scheduled to complete 6 weeks on 12/17/18.    HPI:  Since discharge Oscar Brown reports that he has continued to feel better and better and now thinks he is back to normal. He has a RUE PICC line in place continuously infusing PCN via pump and is without pain, drainage or erythema and is well maintained by [facility]. No swelling or altered sensation in affected distal extremity. He has what he would consider baseline edema of the lower extremities (L>R) and uses compression therapy which he finds to be helpful. 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 Denies any headaches, vision changes, shortness of breath, abdominal/flank pain, SOB, DOE or orthopnea. Weighs himself at home regularly and has follow up with Dr. BClayborne Danaclinic next week. No fevers, chills or  sweats. It sounds like he had a ulcer on his foot/toe from rubbing on shoes at some point that is healed now. No cellulitis noted or lower leg pain.   Review of Systems  Constitutional: Negative for chills and fever.  HENT: Negative for tinnitus.   Eyes: Negative for blurred vision and photophobia.    Respiratory: Negative for cough and sputum production.   Cardiovascular: Negative for chest pain.  Gastrointestinal: Negative for diarrhea, nausea and vomiting.  Genitourinary: Negative for dysuria.  Skin: Negative for rash.  Neurological: Negative for dizziness, tingling, focal weakness, weakness and headaches.  Psychiatric/Behavioral: The patient does not have insomnia.     Past Medical History:  Diagnosis Date  . Acute diastolic congestive heart failure ([facility]) 09/21/2015   takes Lasix daily  . Acute renal failure ([facility])    ATN s/p cardiac arrest  . Atrial fibrillation ([place])   . 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  Complicated by chronic left lower extremity edema and occasional recurrent left lower extremity cellulitis   . COPD (chronic obstructive pulmonary disease) ([place])   . DVT (deep venous thrombosis) ([place]) early 80's   left leg  . HLD (hyperlipidemia)   . Incidental lung nodule, less than or equal to 56m 11/10/2015   3 mm nodule right lung  . Lymphedema early 861's . Mitral regurgitation 10/06/2012  . Obstructive sleep apnea 12/14/2006   AHI 19.2/hr, desaturation to 75% on Polysomnography 02/26/2005.  Uses 11 cm H2O nocturnal nasal CPAP with good symptom control.   . S/P minimally invasive mitral valve replacement with metallic valve 165/53/7482  33 mm Carbomedics Optiform bileaflet mechanical prosthesis placed via right mini-thoracotomy approach    Outpatient Medications Prior to Visit  Medication Sig Dispense Refill  . aspirin (ASPIR-81) 81 MG EC tablet Take 1 tablet (81 mg total) by mouth daily. Swallow whole. 30 tablet 12  . furosemide (LASIX) 40 MG tablet One tab twice a week. (Patient taking differently: Take 40 mg by mouth See admin instructions. Take 40 mg by mouth twice weekly on Monday and Friday) 10 tablet 6  . penicillin G IVPB Inject 24 Million Units into the vein daily. As a continuous infusion Indication:  Strep prosthetic  valve endocarditis Last Day of Therapy:  12/17/18 Labs - Once weekly:  CBC/D and BMP, Labs - Every other week:  ESR and CRP 41 Units 0  . rosuvastatin (CRESTOR) 20 MG tablet Take 20 mg by mouth daily.    .Marland Kitchenwarfarin (COUMADIN) 5 MG tablet TAKE 1 tablet daily until you can follow up with your coumadin clinic. 30 tablet 0   No facility-administered medications prior to visit.      Allergies  Allergen Reactions  . Iohexol Hives, Itching and Other (See Comments)    Patient needs 13 hour prep per Dr. WPascal Lux after cath he broke out in hives and had itching     Social History   Tobacco Use  . Smoking status: Former Smoker    Packs/day: 0.10    Years: 9.00    Pack years: 0.90    Types: Cigarettes    Last attempt to quit: 09/05/2012    Years since quitting: 6.2  . Smokeless tobacco: Never Used  Substance Use Topics  . Alcohol use: Yes    Alcohol/week: 0.0 standard drinks    Comment: 1 beer once a week or special occasions  . Drug use: No    Family History  Problem Relation Age  of Onset  . Cancer Mother        type unknown but had a colostomy bag  . Other Father        bone disease  . Diabetes Paternal Aunt     Objective:   Vitals:   12/07/18 1012  BP: 134/86  Pulse: 69  Temp: 97.9 F (36.6 C)  TempSrc: Oral  Weight: 247 lb (112 kg)   Body mass index is 38.69 kg/m.  Physical Exam Constitutional:      General: He is not in acute distress.    Appearance: Normal appearance. He is not ill-appearing.  HENT:     Mouth/Throat:     Mouth: Mucous membranes are moist.     Pharynx: Oropharynx is clear.  Cardiovascular:     Rate and Rhythm: Normal rate and regular rhythm.     Heart sounds: No murmur. No gallop.      Comments: Mechanical click Pulmonary:     Effort: Pulmonary effort is normal. No respiratory distress.     Breath sounds: Normal breath sounds.  Abdominal:     General: There is no distension.     Palpations: Abdomen is soft.     Tenderness: There is no  abdominal tenderness.  Musculoskeletal:        General: Swelling (L>R, compression stockings in place) present.     Comments: RUE PICC line - clean/dry dressing. Insertion site w/o erythema, tenderness, drainage, cording or distal swelling of affected extremity   Skin:    General: Skin is warm and dry.     Capillary Refill: Capillary refill takes less than 2 seconds.  Neurological:     General: No focal deficit present.     Mental Status: He is alert and oriented to person, place, and time.  Psychiatric:        Mood and Affect: Mood normal.     Lab Results: Lab Results  Component Value Date   WBC 4.1 11/06/2018   HGB 12.6 (L) 11/06/2018   HCT 40.2 11/06/2018   MCV 88.0 11/06/2018   PLT 265 11/06/2018    Lab Results  Component Value Date   CREATININE 0.89 11/07/2018   BUN 12 11/07/2018   NA 138 11/07/2018   K 4.4 11/07/2018   CL 105 11/07/2018   CO2 23 11/07/2018    Lab Results  Component Value Date   ALT 31 11/01/2018   AST 54 (H) 11/01/2018   ALKPHOS 74 11/01/2018   BILITOT 1.5 (H) 11/01/2018     Assessment & Plan:   Problem List Items Addressed This Visit      Unprioritized   Sepsis due to cellulitis [facility])    Resolved - ?referral to lymphedema clinic for assistance in management. Counseled on preventative tactics to reduce recurrence of cellulitis. He would likely benefit from on demand therapy with penicillin so he can stay on top of recurrent episodes. Will discuss at upcoming appointment.       H/O mitral valve replacement with mechanical valve    TEE pending as detailed below. FU with Dr. Roxy Manns.       Acute bacterial endocarditis - Primary    Jameson seems to be recovering well from group b streptococcal PVE. He tolerated dual beta-lactam + aminoglycoside therapy well and for full 14 day course and has continued to do well on PCN therapy. PICC looks good. No signs of relapsing infection or septic embolism complication. [facility]  and has an appointment next  week - will reach out to see if they can assist with repeating his TEE. Will continue antibiotics until repeated TEE to reassess vegetation of MV ring. He will return 7-10 days after stopping antibiotics to see how he is doing and repeat blood cultures at that time as well.       Medication monitoring encounter    All OPAT lab work reviewed and within normal limits. Creatinine stable with labs 12/16 @ 0.9.         Return in about 18 days (around 12/25/2018) for follow up.   Janene Madeira, MSN, NP-C [facility] for Infectious [place] Pager: [phone] Office: [phone]  12/07/18  1:27 PM

## 2018-12-06 NOTE — Assessment & Plan Note (Signed)
All OPAT lab work reviewed and within normal limits. Creatinine stable with labs 12/16 @ 0.9.

## 2018-12-07 ENCOUNTER — Ambulatory Visit (INDEPENDENT_AMBULATORY_CARE_PROVIDER_SITE_OTHER): Payer: Managed Care, Other (non HMO) | Admitting: Infectious Diseases

## 2018-12-07 ENCOUNTER — Encounter: Payer: Self-pay | Admitting: Infectious Diseases

## 2018-12-07 VITALS — BP 134/86 | HR 69 | Temp 97.9°F | Wt 247.0 lb

## 2018-12-07 DIAGNOSIS — T826XXD Infection and inflammatory reaction due to cardiac valve prosthesis, subsequent encounter: Secondary | ICD-10-CM

## 2018-12-07 DIAGNOSIS — I330 Acute and subacute infective endocarditis: Secondary | ICD-10-CM | POA: Diagnosis not present

## 2018-12-07 DIAGNOSIS — Z95828 Presence of other vascular implants and grafts: Secondary | ICD-10-CM

## 2018-12-07 DIAGNOSIS — Z5181 Encounter for therapeutic drug level monitoring: Secondary | ICD-10-CM

## 2018-12-07 DIAGNOSIS — I5020 Unspecified systolic (congestive) heart failure: Secondary | ICD-10-CM | POA: Diagnosis not present

## 2018-12-07 DIAGNOSIS — Z91041 Radiographic dye allergy status: Secondary | ICD-10-CM

## 2018-12-07 DIAGNOSIS — L0390 Cellulitis, unspecified: Secondary | ICD-10-CM

## 2018-12-07 DIAGNOSIS — Z952 Presence of prosthetic heart valve: Secondary | ICD-10-CM

## 2018-12-07 DIAGNOSIS — A419 Sepsis, unspecified organism: Secondary | ICD-10-CM

## 2018-12-07 DIAGNOSIS — B951 Streptococcus, group B, as the cause of diseases classified elsewhere: Secondary | ICD-10-CM | POA: Diagnosis not present

## 2018-12-07 DIAGNOSIS — Z872 Personal history of diseases of the skin and subcutaneous tissue: Secondary | ICD-10-CM

## 2018-12-07 DIAGNOSIS — Z87891 Personal history of nicotine dependence: Secondary | ICD-10-CM

## 2018-12-07 NOTE — Patient Instructions (Addendum)
Will reach out to Dr. Windy Canny team to see about arranging a repeat TEE for you.   Continue antibiotics through January 19th as planned. Everything seems to be looking good and glad you are feeling better!   Will see you back 7 days after stopping antibiotics to repeat your blood cultures (to make sure bacteria is gone) and check in again.

## 2018-12-11 ENCOUNTER — Telehealth (HOSPITAL_COMMUNITY): Payer: Self-pay

## 2018-12-11 ENCOUNTER — Encounter (HOSPITAL_COMMUNITY): Payer: Self-pay

## 2018-12-11 ENCOUNTER — Other Ambulatory Visit (HOSPITAL_COMMUNITY): Payer: Self-pay

## 2018-12-11 DIAGNOSIS — I340 Nonrheumatic mitral (valve) insufficiency: Secondary | ICD-10-CM

## 2018-12-11 DIAGNOSIS — I059 Rheumatic mitral valve disease, unspecified: Secondary | ICD-10-CM

## 2018-12-11 DIAGNOSIS — I058 Other rheumatic mitral valve diseases: Secondary | ICD-10-CM

## 2018-12-11 NOTE — Telephone Encounter (Signed)
Pt called to schedule TEE.  PT scheduled for 12/13/2018 at 9am. Pt given instructions to arrive at 7am in main hospital. Per MD, pt does not have to hold coumadin. Pt instructed to be NPO at midnight and to have someone responsible present to drive him home. Pt states understanding of all, questions answered.

## 2018-12-13 ENCOUNTER — Ambulatory Visit (HOSPITAL_COMMUNITY): Payer: Managed Care, Other (non HMO)

## 2018-12-13 ENCOUNTER — Ambulatory Visit (HOSPITAL_COMMUNITY)
Admission: RE | Admit: 2018-12-13 | Discharge: 2018-12-13 | Disposition: A | Discharge status: Home / Self Care | Payer: Managed Care, Other (non HMO) | Attending: Internal Medicine | Admitting: Internal Medicine

## 2018-12-13 ENCOUNTER — Encounter (HOSPITAL_COMMUNITY): Payer: Self-pay | Admitting: Internal Medicine

## 2018-12-13 ENCOUNTER — Encounter (HOSPITAL_COMMUNITY)
Admission: RE | Disposition: A | Discharge status: Home / Self Care | Payer: Self-pay | Source: Home / Self Care | Attending: Internal Medicine

## 2018-12-13 ENCOUNTER — Other Ambulatory Visit: Payer: Self-pay

## 2018-12-13 ENCOUNTER — Other Ambulatory Visit (HOSPITAL_COMMUNITY): Payer: Self-pay

## 2018-12-13 DIAGNOSIS — L03115 Cellulitis of right lower limb: Secondary | ICD-10-CM | POA: Insufficient documentation

## 2018-12-13 DIAGNOSIS — I10 Essential (primary) hypertension: Secondary | ICD-10-CM | POA: Diagnosis not present

## 2018-12-13 DIAGNOSIS — Z87891 Personal history of nicotine dependence: Secondary | ICD-10-CM | POA: Insufficient documentation

## 2018-12-13 DIAGNOSIS — Z888 Allergy status to other drugs, medicaments and biological substances status: Secondary | ICD-10-CM | POA: Insufficient documentation

## 2018-12-13 DIAGNOSIS — Z79899 Other long term (current) drug therapy: Secondary | ICD-10-CM | POA: Insufficient documentation

## 2018-12-13 DIAGNOSIS — I890 Lymphedema, not elsewhere classified: Secondary | ICD-10-CM | POA: Diagnosis not present

## 2018-12-13 DIAGNOSIS — I420 Dilated cardiomyopathy: Secondary | ICD-10-CM | POA: Diagnosis not present

## 2018-12-13 DIAGNOSIS — I330 Acute and subacute infective endocarditis: Secondary | ICD-10-CM | POA: Diagnosis present

## 2018-12-13 DIAGNOSIS — E669 Obesity, unspecified: Secondary | ICD-10-CM | POA: Insufficient documentation

## 2018-12-13 DIAGNOSIS — I059 Rheumatic mitral valve disease, unspecified: Secondary | ICD-10-CM

## 2018-12-13 DIAGNOSIS — Z952 Presence of prosthetic heart valve: Secondary | ICD-10-CM | POA: Diagnosis not present

## 2018-12-13 DIAGNOSIS — J449 Chronic obstructive pulmonary disease, unspecified: Secondary | ICD-10-CM | POA: Insufficient documentation

## 2018-12-13 DIAGNOSIS — I38 Endocarditis, valve unspecified: Secondary | ICD-10-CM | POA: Diagnosis not present

## 2018-12-13 DIAGNOSIS — I4892 Unspecified atrial flutter: Secondary | ICD-10-CM | POA: Insufficient documentation

## 2018-12-13 DIAGNOSIS — Z7901 Long term (current) use of anticoagulants: Secondary | ICD-10-CM | POA: Insufficient documentation

## 2018-12-13 DIAGNOSIS — G4733 Obstructive sleep apnea (adult) (pediatric): Secondary | ICD-10-CM | POA: Insufficient documentation

## 2018-12-13 DIAGNOSIS — E785 Hyperlipidemia, unspecified: Secondary | ICD-10-CM | POA: Diagnosis not present

## 2018-12-13 DIAGNOSIS — I4891 Unspecified atrial fibrillation: Secondary | ICD-10-CM | POA: Diagnosis not present

## 2018-12-13 DIAGNOSIS — I872 Venous insufficiency (chronic) (peripheral): Secondary | ICD-10-CM | POA: Insufficient documentation

## 2018-12-13 DIAGNOSIS — I058 Other rheumatic mitral valve diseases: Secondary | ICD-10-CM

## 2018-12-13 DIAGNOSIS — Z86718 Personal history of other venous thrombosis and embolism: Secondary | ICD-10-CM | POA: Diagnosis not present

## 2018-12-13 DIAGNOSIS — Z7982 Long term (current) use of aspirin: Secondary | ICD-10-CM | POA: Insufficient documentation

## 2018-12-13 DIAGNOSIS — Z8674 Personal history of sudden cardiac arrest: Secondary | ICD-10-CM | POA: Diagnosis not present

## 2018-12-13 DIAGNOSIS — I2510 Atherosclerotic heart disease of native coronary artery without angina pectoris: Secondary | ICD-10-CM | POA: Insufficient documentation

## 2018-12-13 DIAGNOSIS — Z6838 Body mass index (BMI) 38.0-38.9, adult: Secondary | ICD-10-CM | POA: Diagnosis not present

## 2018-12-13 HISTORY — PX: TEE WITHOUT CARDIOVERSION: SHX5443

## 2018-12-13 SURGERY — ECHOCARDIOGRAM, TRANSESOPHAGEAL
Anesthesia: Moderate Sedation

## 2018-12-13 MED ORDER — FENTANYL CITRATE (PF) 100 MCG/2ML IJ SOLN
INTRAMUSCULAR | Status: AC
  Filled 2018-12-13: qty 2

## 2018-12-13 MED ORDER — MIDAZOLAM HCL (PF) 5 MG/ML IJ SOLN
INTRAMUSCULAR | Status: AC
  Filled 2018-12-13: qty 2

## 2018-12-13 MED ORDER — DIPHENHYDRAMINE HCL 50 MG/ML IJ SOLN
INTRAMUSCULAR | Status: AC
  Filled 2018-12-13: qty 1

## 2018-12-13 MED ORDER — SODIUM CHLORIDE 0.9 % IV SOLN: INTRAVENOUS | Status: DC

## 2018-12-13 MED ORDER — BUTAMBEN-TETRACAINE-BENZOCAINE 2-2-14 % EX AERO
INHALATION_SPRAY | CUTANEOUS | Status: DC | PRN
  Administered 2018-12-13: 2 via TOPICAL

## 2018-12-13 MED ORDER — MIDAZOLAM HCL (PF) 10 MG/2ML IJ SOLN
INTRAMUSCULAR | Status: DC | PRN
  Administered 2018-12-13 (×2): 2 mg via INTRAVENOUS
  Administered 2018-12-13: 1 mg via INTRAVENOUS

## 2018-12-13 MED ORDER — FENTANYL CITRATE (PF) 100 MCG/2ML IJ SOLN
INTRAMUSCULAR | Status: DC | PRN
  Administered 2018-12-13 (×2): 25 ug via INTRAVENOUS

## 2018-12-13 NOTE — Discharge Instructions (Signed)

## 2018-12-13 NOTE — Progress Notes (Signed)
Patient ID: Oscar Brown, male   DOB: 10/24/1966, 53 y.o.   MRN: [id number]     Advanced Heart Failure Clinic Note     Cardiologist: Dr Haroldine Laws  PCP: [name]  HPI: Oscar Brown is a 53 y.o. male  with OSA and mitral regurgitation. 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 On POD #3  he had a sudden VF arrest with nearly an hour of resuscitation time. Follow-up transthoracic and transesophageal echocardiograms performed after the arrest revealed normal function of the mechanical prosthetic valve with severe dysfunction involving the inferior wall of the left ventricle EF35-40%. Cardiac enzymes went quite high and he had some bradycardia and AV block, all suggestive of possible inferior wall myocardial infarction. However, left ventricular function completely normalized prior to hospital discharge. Plans for diagnostic cardiac catheterization were postponed because the patient developed acute non-oliguric renal failure with creatinine up to 7.Because the patient's rhythm and left ventricular systolic function recovered completely, it was felt that the patient did not need defibrillator implantation. The patient was ultimately discharged from the hospital on the 18th postoperative day.   Underwent successful RFA of AFL 03/20/18 with Dr. Lovena Le   Admitted 12/4 - 11/07/18 with LLE cellulitis. Started on empiric ABX. BCx positive for Streptococcus agalactiae. With mechanical MV, taken for TEE which showed highly mobile echo-density attached to the anterior portion of the sewing ring of the mechanical mitral valve measuring 12x5 mm consistent with prosthesis vegetation. Pt thought not to need surgical intervention. PICC was placed to continue 6 week course of IV ABX per ID. Also noted to have chronic DVT. Coumadin continued, no other change.     He presents today for post hospital follow up. Had TEE yesterday, 12/13/2018 with no noted vegetation. As below. Discussed with Dr. Haroldine Laws personally. Likely curative given IV ABX. He is feeling great overall. Has only 3 days left of ABX. Will have final labs drawn and PICC line pulled on Monday. He remais very active, painting, mowing yards, and working at [employer]. He denies any DOE, lightheadedness or dizziness. No CP or palpitations. Denies bleeding on coumadin. He would like to get his screening colonoscopy done, knows that he would need Lovenox bridge. Taking all medications as directed. Taking lasix 40 mg twice weekly. He has not needed any extra.   EKG NSR 67 bpm with 1st degree AV block  TEE 12/13/2018 LEFT VENTRICLE: EF = 55-60%. No regional wall motion abnormalities. LEFT ATRIUM: Dilated LEFT ATRIAL APPENDAGE: No thrombus.  AORTIC VALVE:  Trileaflet. No AI/AS. No vegetation. MITRAL VALVE:    Mechanical MV. Functioning well. No MR. Mild MS mean gradient 92mHG. Previously seen vegetation no longer present  TRICUSPID VALVE: Normal. Mild TR. No vegetation. PULMONIC VALVE: Grossly normal. No vegetation. INTERATRIAL SEPTUM: No PFO or ASD. DESCENDING AORTA: Mild plaque  TEE 11/03/18 There is a highly mobile echodensity attached to the anterior portion of the sewing ring of the mechanical mitral valve measuring 12 x 5 mm consistent with prosthesis vegetation.   Echo 02/20/18 EF ~50% inferior HK RV mild to moderately HK. Septal flattening.   Echo 2/17 EF 45-50% inferior HK. MV ok   Review of systems complete and found to be negative unless listed in HPI.    SH:  Social History   Socioeconomic History  . Marital status: Legally Separated    Spouse name: Not on file  .  Number of children: 3  . Years of education: Not on file  . Highest education level: Not on file  Occupational History  . Not on file  Social Needs  . Financial resource strain: Not on file  . Food  insecurity:    Worry: Not on file    Inability: Not on file  . Transportation needs:    Medical: Not on file    Non-medical: Not on file  Tobacco Use  . Smoking status: Former Smoker    Packs/day: 0.10    Years: 9.00    Pack years: 0.90    Types: Cigarettes    Last attempt to quit: 09/05/2012    Years since quitting: 6.2  . Smokeless tobacco: Never Used  Substance and Sexual Activity  . Alcohol use: Yes    Alcohol/week: 0.0 standard drinks    Comment: 1 beer once a week or special occasions  . Drug use: No  . Sexual activity: Yes  Lifestyle  . Physical activity:    Days per week: Not on file    Minutes per session: Not on file  . Stress: Not on file  Relationships  . Social connections:    Talks on phone: Not on file    Gets together: Not on file    Attends religious service: Not on file    Active member of club or organization: Not on file    Attends meetings of clubs or organizations: Not on file    Relationship status: Not on file  . Intimate partner violence:    Fear of current or ex partner: Not on file    Emotionally abused: Not on file    Physically abused: Not on file    Forced sexual activity: Not on file  Other Topics Concern  . Not on file  Social History Narrative  . Not on file   FH:  - Denies history of sudden cardiac death.  Past Medical History:  Diagnosis Date  . Acute diastolic congestive heart failure ([place]) 09/21/2015   takes Lasix daily  . Acute renal failure ([facility])    ATN s/p cardiac arrest  . Atrial fibrillation ([place])   . 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  Complicated by chronic left lower extremity edema and occasional recurrent left lower extremity cellulitis   . COPD (chronic obstructive pulmonary disease) ([place])   . DVT (deep venous thrombosis) ([place]) early 80's   left leg  . HLD (hyperlipidemia)   . Incidental lung nodule, less than or equal to 58m 11/10/2015   3 mm nodule right lung  .  Lymphedema early 848's . Mitral regurgitation 10/06/2012  . Obstructive sleep apnea 12/14/2006   AHI 19.2/hr, desaturation to 75% on Polysomnography 02/26/2005.  Uses 11 cm H2O nocturnal nasal CPAP with good symptom control.   . S/P minimally invasive mitral valve replacement with metallic valve 109/32/3557  33 mm Carbomedics Optiform bileaflet mechanical prosthesis placed via right mini-thoracotomy approach    Current Outpatient Medications  Medication Sig Dispense Refill  . aspirin (ASPIR-81) 81 MG EC tablet Take 1 tablet (81 mg total) by mouth daily. Swallow whole. 30 tablet 12  . furosemide (LASIX) 40 MG tablet One tab twice a week. (Patient taking differently: Take 40 mg by mouth See admin instructions. Take 40 mg by mouth twice weekly on Monday and Friday) 10 tablet 6  . penicillin G IVPB Inject 24 Million Units into the vein daily. As a continuous infusion  Indication:  Strep prosthetic valve endocarditis Last Day of Therapy:  12/17/18 Labs - Once weekly:  CBC/D and BMP, Labs - Every other week:  ESR and CRP 41 Units 0  . rosuvastatin (CRESTOR) 20 MG tablet Take 20 mg by mouth daily.    Marland Kitchen warfarin (COUMADIN) 5 MG tablet TAKE 1 tablet daily until you can follow up with your coumadin clinic. (Patient taking differently: Take 7.5-10 mg by mouth See admin instructions. TAKE 1 tablet daily until you can follow up with your coumadin clinic. 10 mg on Monday and Friday, 7.5 mg all other days) 30 tablet 0   No current facility-administered medications for this encounter.    Vitals:   12/14/18 0954 12/14/18 1024  BP: (!) 148/92 138/84  Pulse: 68   SpO2: 98%   Weight: 114.2 kg (251 lb 12.8 oz)      Wt Readings from Last 3 Encounters:  12/14/18 114.2 kg (251 lb 12.8 oz)  12/13/18 112 kg (247 lb)  12/07/18 112 kg (247 lb)    PHYSICAL EXAM: General: Well appearing. No resp difficulty. HEENT: Normal Neck: Supple. JVP 6-7 cm. Carotids 2+ bilat; no bruits. No thyromegaly or nodule noted. Cor:  PMI nondisplaced. RRR, Mechanical S1, crisp. Lungs: CTAB, normal effort. Abdomen: Soft, non-tender, non-distended, no HSM. No bruits or masses. +BS  Extremities: No cyanosis, clubbing, or rash. R and LLE no edema.  Neuro: Alert & orientedx3, cranial nerves grossly intact. moves all 4 extremities w/o difficulty. Affect pleasant   ASSESSMENT & PLAN:  1. Recurrent A flutter, atypical - s/p DCCV 12/23/17. - s/p Aflutter ablation 03/20/18  - NSR by EKG today, personally reviewed.  - On coumadin for MVR. Denies bleeding.  2. Mitral regurgitation s/p mechanical MVR - Normal EF and stable without vegetation on TEE 12/13/2018. - Continue coumadin.  - INR stable on recent check.  - Has resultant RV strain due longstanding MV disease - Reinforced need for SBE prophylaxis 3. 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 - Saw ID 12/07/2018 - Completed gentamicin therapy 11/17/18 and will continue IV PCN until 12/17/2018 4. Prolonged in-hospital post-cardiac arrest - VT/VF - No change to current plan.   5. Chronic DVT - Continue coumadin as above.  6. Lymphedema in left leg - Continue lasix 40 mg twice weekly. BMET today.  - Continue compression hose. He wraps with any exacerbation with relief. Does a great job of managing himself.  7. HTN - BP at home runs 110-120s. He is going to continue to check at home. If BP running > 140 is to call and let us know.  8. CAD - Cath 12/16 distal LCx 45% - No s/s of ischemia.    - Continue ASA/statin 9. Obesity - Body mass index is 39.44 kg/m.  - Encouraged weight loss and exercise.  10. Health Screening - He is due for screening colonoscopy. He has normal EF. Would need lovenox coverage for INR off coumadin, but otherwise should be fine to proceed. Will confirm with MD.   Shirley Friar, PA-C  9:57 AM   Greater than 50% of the 25 minute visit was spent in counseling/coordination of care regarding  disease state education, salt/fluid restriction, sliding scale diuretics, and medication compliance.

## 2018-12-13 NOTE — CV Procedure (Signed)
    TRANSESOPHAGEAL ECHOCARDIOGRAM   NAME:  TRA WILEMON   MRN: [id number] DOB:  February 07, 1966   ADMIT DATE: 12/13/2018  INDICATIONS:   PROCEDURE:   Informed consent was obtained prior to the procedure. The risks, benefits and alternatives for the procedure were discussed and the patient comprehended these risks.  Risks include, but are not limited to, cough, sore throat, vomiting, nausea, somnolence, esophageal and stomach trauma or perforation, bleeding, low blood pressure, aspiration, pneumonia, infection, trauma to the teeth and death.    After a procedural time-out, the patient was given 5 mg versed and 50 mcg fentanyl for moderate sedation.  The oropharynx was anesthetized 10 cc of topical 1% viscous lidocaine.  The transesophageal probe was inserted in the esophagus and stomach without difficulty and multiple views were obtained.    COMPLICATIONS:    There were no immediate complications.  FINDINGS:  LEFT VENTRICLE: EF = 55-60%. No regional wall motion abnormalities.  RIGHT VENTRICLE: Normal size and function.   LEFT ATRIUM: Dilated  LEFT ATRIAL APPENDAGE: No thrombus.   RIGHT ATRIUM: Normal  AORTIC VALVE:  Trileaflet. No AI/AS. No vegetation.  MITRAL VALVE:    Mechanical MV. Functioning well. No MR. Mild MS mean gradient 51mmHG. Previously seen vegetation no longer present   TRICUSPID VALVE: Normal. Mild TR. No vegetation.  PULMONIC VALVE: Grossly normal. No vegetation.  INTERATRIAL SEPTUM: No PFO or ASD.  PERICARDIUM: No effusion  DESCENDING AORTA: Mild plaque   CONCLUSION:  Previously seen MVR vegetation no longer present    Benay Spice 9:49 AM

## 2018-12-13 NOTE — Interval H&P Note (Signed)
History and Physical Interval Note:  12/13/2018 9:01 AM  Oscar Brown  has presented today for surgery, with the diagnosis of ENDOCARDITIS  The various methods of treatment have been discussed with the patient and family. After consideration of risks, benefits and other options for treatment, the patient has consented to  Procedure(s): TRANSESOPHAGEAL ECHOCARDIOGRAM (TEE) (N/A) as a surgical intervention .  The patient's history has been reviewed, patient examined, no change in status, stable for surgery.  I have reviewed the patient's chart and labs.  Questions were answered to the patient's satisfaction.     Brylon Brenning

## 2018-12-14 ENCOUNTER — Ambulatory Visit (HOSPITAL_COMMUNITY)
Admission: RE | Admit: 2018-12-14 | Discharge: 2018-12-14 | Disposition: A | Discharge status: Home / Self Care | Payer: Managed Care, Other (non HMO) | Source: Ambulatory Visit | Attending: Internal Medicine | Admitting: Internal Medicine

## 2018-12-14 ENCOUNTER — Encounter (HOSPITAL_COMMUNITY): Payer: Self-pay

## 2018-12-14 VITALS — BP 138/84 | HR 68 | Wt 251.8 lb

## 2018-12-14 DIAGNOSIS — R7881 Bacteremia: Secondary | ICD-10-CM | POA: Insufficient documentation

## 2018-12-14 DIAGNOSIS — Z79899 Other long term (current) drug therapy: Secondary | ICD-10-CM | POA: Insufficient documentation

## 2018-12-14 DIAGNOSIS — I2510 Atherosclerotic heart disease of native coronary artery without angina pectoris: Secondary | ICD-10-CM

## 2018-12-14 DIAGNOSIS — E785 Hyperlipidemia, unspecified: Secondary | ICD-10-CM | POA: Insufficient documentation

## 2018-12-14 DIAGNOSIS — I440 Atrioventricular block, first degree: Secondary | ICD-10-CM | POA: Insufficient documentation

## 2018-12-14 DIAGNOSIS — Z8674 Personal history of sudden cardiac arrest: Secondary | ICD-10-CM | POA: Diagnosis not present

## 2018-12-14 DIAGNOSIS — I110 Hypertensive heart disease with heart failure: Secondary | ICD-10-CM | POA: Diagnosis not present

## 2018-12-14 DIAGNOSIS — Z87891 Personal history of nicotine dependence: Secondary | ICD-10-CM | POA: Insufficient documentation

## 2018-12-14 DIAGNOSIS — Z6839 Body mass index (BMI) 39.0-39.9, adult: Secondary | ICD-10-CM | POA: Insufficient documentation

## 2018-12-14 DIAGNOSIS — J449 Chronic obstructive pulmonary disease, unspecified: Secondary | ICD-10-CM | POA: Insufficient documentation

## 2018-12-14 DIAGNOSIS — Z952 Presence of prosthetic heart valve: Secondary | ICD-10-CM | POA: Diagnosis not present

## 2018-12-14 DIAGNOSIS — I4891 Unspecified atrial fibrillation: Secondary | ICD-10-CM | POA: Diagnosis not present

## 2018-12-14 DIAGNOSIS — I872 Venous insufficiency (chronic) (peripheral): Secondary | ICD-10-CM

## 2018-12-14 DIAGNOSIS — I4892 Unspecified atrial flutter: Secondary | ICD-10-CM | POA: Diagnosis not present

## 2018-12-14 DIAGNOSIS — I340 Nonrheumatic mitral (valve) insufficiency: Secondary | ICD-10-CM | POA: Diagnosis not present

## 2018-12-14 DIAGNOSIS — I059 Rheumatic mitral valve disease, unspecified: Secondary | ICD-10-CM

## 2018-12-14 DIAGNOSIS — I484 Atypical atrial flutter: Secondary | ICD-10-CM | POA: Insufficient documentation

## 2018-12-14 DIAGNOSIS — I058 Other rheumatic mitral valve diseases: Secondary | ICD-10-CM

## 2018-12-14 DIAGNOSIS — I890 Lymphedema, not elsewhere classified: Secondary | ICD-10-CM | POA: Insufficient documentation

## 2018-12-14 DIAGNOSIS — Z7982 Long term (current) use of aspirin: Secondary | ICD-10-CM | POA: Insufficient documentation

## 2018-12-14 DIAGNOSIS — Z7901 Long term (current) use of anticoagulants: Secondary | ICD-10-CM | POA: Insufficient documentation

## 2018-12-14 DIAGNOSIS — I5031 Acute diastolic (congestive) heart failure: Secondary | ICD-10-CM | POA: Insufficient documentation

## 2018-12-14 DIAGNOSIS — I82509 Chronic embolism and thrombosis of unspecified deep veins of unspecified lower extremity: Secondary | ICD-10-CM | POA: Diagnosis not present

## 2018-12-14 DIAGNOSIS — G4733 Obstructive sleep apnea (adult) (pediatric): Secondary | ICD-10-CM | POA: Insufficient documentation

## 2018-12-14 DIAGNOSIS — Z954 Presence of other heart-valve replacement: Secondary | ICD-10-CM

## 2018-12-14 DIAGNOSIS — I10 Essential (primary) hypertension: Secondary | ICD-10-CM

## 2018-12-14 DIAGNOSIS — E669 Obesity, unspecified: Secondary | ICD-10-CM | POA: Insufficient documentation

## 2018-12-14 LAB — BASIC METABOLIC PANEL
Anion gap: 9 (ref 5–15)
BUN: 8 mg/dL (ref 6–20)
CO2: 21 mmol/L — ABNORMAL LOW (ref 22–32)
Calcium: 9.1 mg/dL (ref 8.9–10.3)
Chloride: 107 mmol/L (ref 98–111)
Creatinine, Ser: 0.84 mg/dL (ref 0.61–1.24)
GFR calc Af Amer: >60 mL/min (ref >60)
GFR calc non Af Amer: >60 mL/min (ref >60)
Glucose, Bld: 84 mg/dL (ref 70–99)
Potassium: 4.4 mmol/L (ref 3.5–5.1)
Sodium: 137 mmol/L (ref 135–145)

## 2018-12-14 NOTE — Addendum Note (Signed)
Encounter addended by: Harvie Junior, CMA on: 12/14/2018 11:02 AM  Actions taken: Order list changed, Diagnosis association updated

## 2018-12-14 NOTE — Addendum Note (Signed)
Encounter addended by: Vanice Sarah, CMA on: 12/14/2018 10:42 AM  Actions taken: Clinical Note Signed

## 2018-12-14 NOTE — Patient Instructions (Signed)
Labs today. Will call you with results if abnormal, otherwise no news is good news.

## 2018-12-18 ENCOUNTER — Ambulatory Visit (INDEPENDENT_AMBULATORY_CARE_PROVIDER_SITE_OTHER): Payer: Managed Care, Other (non HMO) | Admitting: Pharmacist

## 2018-12-18 DIAGNOSIS — Z954 Presence of other heart-valve replacement: Secondary | ICD-10-CM

## 2018-12-18 DIAGNOSIS — Z7901 Long term (current) use of anticoagulants: Secondary | ICD-10-CM

## 2018-12-18 DIAGNOSIS — I4892 Unspecified atrial flutter: Secondary | ICD-10-CM

## 2018-12-18 LAB — POCT INR: INR: 4.1 — AB (ref 2.0–3.0)

## 2018-12-21 ENCOUNTER — Other Ambulatory Visit: Payer: Self-pay | Admitting: *Deleted

## 2018-12-21 DIAGNOSIS — I330 Acute and subacute infective endocarditis: Secondary | ICD-10-CM

## 2018-12-25 ENCOUNTER — Other Ambulatory Visit: Payer: Managed Care, Other (non HMO)

## 2018-12-25 DIAGNOSIS — I330 Acute and subacute infective endocarditis: Secondary | ICD-10-CM

## 2019-01-02 LAB — CULTURE, BLOOD (SINGLE)
MICRO NUMBER:: 111469
MICRO NUMBER:: 111471
RESULT: NO GROWTH
Result:: NO GROWTH
SPECIMEN QUALITY:: ADEQUATE
SPECIMEN QUALITY:: ADEQUATE

## 2019-01-03 NOTE — Assessment & Plan Note (Addendum)
Group b streptococcal PVE s/p treatment with 2 weeks gent + pcn dual therapy followed by 4 weeks pcn infusion. Repeat TEE w/o evidence of any residual vegetation; repeat blood cultures following completion of abx are no growth, final. It appears that that treatment has been curative for him. He can follow up here as needed.

## 2019-01-03 NOTE — Assessment & Plan Note (Addendum)
Lymphedema involving the left lower leg. Hx of recurrent cellulitis and ulcer on right great toe (now healed). We discussed today on demand therapy with amoxicillin should he feel he is developing cellulitis-- will have him start taking amox 500 mg TID x 3-5 days until resolution of condition.

## 2019-01-03 NOTE — Assessment & Plan Note (Signed)
SBE prophylaxis in the future with amoxicillin as previously discussed.

## 2019-01-03 NOTE — Progress Notes (Signed)
c     Patient: Oscar Brown  DOB: Jun 10, 1966 MRN: [id number] PCP: Oscar Redwood, MD  Referring Provider: HFU   Patient Active Problem List   Diagnosis Date Noted  . Medication monitoring encounter 12/06/2018  . Acute bacterial endocarditis   . H/O mitral valve replacement with mechanical valve   . Essential hypertension   . Coronary artery disease involving native coronary artery of native heart without angina pectoris 12/23/2016  . Long term (current) use of anticoagulants [Z79.01] 12/05/2015  . Atrial flutter ([place])   . History of cardiac arrest   . Incidental lung nodule, less than or equal to 33mm 11/10/2015  . Tobacco use disorder 06/05/2015  . Preventative health care 10/06/2012  . Obesity, Class I, BMI 30-34.9 12/14/2006  . OSA (obstructive sleep apnea) 12/14/2006  . Chronic venous insufficiency 12/14/2006     Subjective:  CC:  Hospital follow up for streptococcal endocarditis of prosthetic mitral valve. Repeat TEE negative for any vegetation. Feels great and back at work.   Brief ID Hx:  Oscar Brown is a 53 y.o. male with pmhx notable for systolic heart failure s/p minimally invasive mitral valve replacement (bileaflet mechanical prosthetic valve Oscar Brown) 10-2015 to correct damage related to rheumatic heart disease. He had a complicated post op course with prolonged VF arrest and acute heart failure. Now follows with Dr. Haroldine Brown and AHF clinic.   [name] required admission to [facility] 11/01/18 after several weeks of feeling poorly with fevers. He was found to have high fever, hypotension and left lower extremity cellulitis. Prior to admission he required treatment for RLE cellulitis with antibiotics and at baseline he struggles with lymphedema of the LLE. He was started on vancomycin + cefepime empirically for sepsis. Blood cultures 12-4 grew streptococcus agalactiae (group b strep) in 2/2 sets and antibiotics were narrowed to penicillin 24 million units Q24h  alone. TEE was obtained on 12/06 revealing highly mobile echodensity attached to anterior portion of the sewing ring of the mechanical mitral valve measuring 12x5 mm. Gentamicin was added for recommended 14 days of synergistic therapy in accordance with strep PVE guidelines. Repeated blood cultures obtained on 12-7 were sterile. He was seen by Dr. Roxan Brown in consultation on 12/07 and determined that no surgery would be required due to intact valvular function.  He was last seen on 12/09 by Dr. Johnnye Brown and was tolerating gentamicin therapy well. He was discharged home on 11/05/18. Oscar Brown was continued through 11/17/18 and his penicillin is scheduled to complete 6 weeks on 12/17/18.    HPI:  Completed antibiotics and now has had his PICC line removed. Repeated TEE done by Dr. Haroldine Brown 2 weeks ago and no residual signs of endocarditis involving the mitral ring.   He is feeling very well and back at work. Hopeful that the blood work we did a week ago looks better. Denies any fevers/chills. Has had a little cold but since starting some probiotics he feels that he has improved.   Review of Systems  Constitutional: Negative for chills and fever.  HENT: Positive for congestion. Negative for sore throat.   Respiratory: Negative for cough and sputum production.   Cardiovascular: Negative for chest pain.  Gastrointestinal: Negative for abdominal pain and diarrhea.  Genitourinary: Negative.   Musculoskeletal: Negative.   Skin: Negative for rash.  Neurological: Negative for dizziness.    Past Medical History:  Diagnosis Date  . Acute diastolic congestive heart failure ([place]) 09/21/2015   takes Lasix daily  . Acute  renal failure ([place])    ATN s/p cardiac arrest  . Atrial fibrillation ([place])   . 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  Complicated by chronic left lower extremity edema and occasional recurrent left lower extremity cellulitis   . COPD (chronic  obstructive pulmonary disease) ([place])   . DVT (deep venous thrombosis) ([place]) early 80's   left leg  . HLD (hyperlipidemia)   . Incidental lung nodule, less than or equal to 58mm 11/10/2015   3 mm nodule right lung  . Lymphedema early 17's  . Mitral regurgitation 10/06/2012  . Obstructive sleep apnea 12/14/2006   AHI 19.2/hr, desaturation to 75% on Polysomnography 02/26/2005.  Uses 11 cm H2O nocturnal nasal CPAP with good symptom control.   . S/P minimally invasive mitral valve replacement with metallic valve 40/98/1191   33 mm Carbomedics Optiform bileaflet mechanical prosthesis placed via right mini-thoracotomy approach    Outpatient Medications Prior to Visit  Medication Sig Dispense Refill  . aspirin (ASPIR-81) 81 MG EC tablet Take 1 tablet (81 mg total) by mouth daily. Swallow whole. 30 tablet 12  . furosemide (LASIX) 40 MG tablet One tab twice a week. (Patient taking differently: Take 40 mg by mouth See admin instructions. Take 40 mg by mouth twice weekly on Monday and Friday) 10 tablet 6  . rosuvastatin (CRESTOR) 20 MG tablet Take 20 mg by mouth daily.    Marland Kitchen warfarin (COUMADIN) 5 MG tablet TAKE 1 tablet daily until you can follow up with your coumadin clinic. (Patient taking differently: Take 7.5-10 mg by mouth See admin instructions. TAKE 1 tablet daily until you can follow up with your coumadin clinic. 10 mg on Monday and Friday, 7.5 mg all other days) 30 tablet 0   No facility-administered medications prior to visit.      Allergies  Allergen Reactions  . Iohexol Hives, Itching and Other (See Comments)    Patient needs 13 hour prep per Oscar Brown, after cath he broke out in hives and had itching     Social History   Tobacco Use  . Smoking status: Former Smoker    Packs/day: 0.10    Years: 9.00    Pack years: 0.90    Types: Cigarettes    Last attempt to quit: 09/05/2012    Years since quitting: 6.3  . Smokeless tobacco: Never Used  Substance Use Topics  . Alcohol use: Yes     Alcohol/week: 0.0 standard drinks    Comment: 1 beer once a week or special occasions  . Drug use: No     Objective:   Vitals:   01/04/19 0854  BP: 123/79  Pulse: 76  Temp: 99 F (37.2 C)  Weight: 247 lb (112 kg)   Body mass index is 38.69 kg/m.  Physical Exam Constitutional:      Appearance: He is well-developed.     Comments: Seated comfortably in chair during visit.   HENT:     Mouth/Throat:     Dentition: Normal dentition. No dental abscesses.  Cardiovascular:     Rate and Rhythm: Normal rate and regular rhythm.     Heart sounds: Normal heart sounds. No murmur.  Pulmonary:     Effort: Pulmonary effort is normal.     Breath sounds: Normal breath sounds.     Comments: occ coarse rhonchi that clears with cough Abdominal:     General: There is no distension.     Palpations: Abdomen is soft.  Tenderness: There is no abdominal tenderness.  Musculoskeletal:     Left lower leg: Edema (stable w/o sx of infection) present.  Lymphadenopathy:     Cervical: No cervical adenopathy.  Skin:    General: Skin is warm and dry.     Findings: No rash.  Neurological:     Mental Status: He is alert and oriented to person, place, and time.  Psychiatric:        Judgment: Judgment normal.     Comments: In good spirits today and engaged in care discussion.      Lab Results: Lab Results  Component Value Date   WBC 4.1 11/06/2018   HGB 12.6 (L) 11/06/2018   HCT 40.2 11/06/2018   MCV 88.0 11/06/2018   PLT 265 11/06/2018    Lab Results  Component Value Date   CREATININE 0.84 12/14/2018   BUN 8 12/14/2018   NA 137 12/14/2018   K 4.4 12/14/2018   CL 107 12/14/2018   CO2 21 (L) 12/14/2018    Lab Results  Component Value Date   ALT 31 11/01/2018   AST 54 (H) 11/01/2018   ALKPHOS 74 11/01/2018   BILITOT 1.5 (H) 11/01/2018     Assessment & Plan:   Problem List Items Addressed This Visit      Unprioritized   Acute bacterial endocarditis - Primary    Group b  streptococcal PVE s/p treatment with 2 weeks gent + pcn dual therapy followed by 4 weeks pcn infusion. Repeat TEE w/o evidence of any residual vegetation; repeat blood cultures following completion of abx are no growth, final. It appears that that treatment has been curative for him. He can follow up here as needed.       Chronic venous insufficiency (Chronic)    Lymphedema involving the left lower leg. Hx of recurrent cellulitis and ulcer on right great toe (now healed). We discussed today on demand therapy with amoxicillin should he feel he is developing cellulitis-- will have him start taking amox 500 mg TID x 3-5 days until resolution of condition.       H/O mitral valve replacement with mechanical valve    SBE prophylaxis in the future with amoxicillin as previously discussed.         Return if symptoms worsen or fail to improve.   Janene Madeira, MSN, NP-C [facility] Pager: [phone] Office: [phone]  01/04/19  9:17 AM

## 2019-01-04 ENCOUNTER — Encounter: Payer: Self-pay | Admitting: Infectious Diseases

## 2019-01-04 ENCOUNTER — Ambulatory Visit (INDEPENDENT_AMBULATORY_CARE_PROVIDER_SITE_OTHER): Payer: Managed Care, Other (non HMO) | Admitting: Infectious Diseases

## 2019-01-04 VITALS — BP 123/79 | HR 76 | Temp 99.0°F | Wt 247.0 lb

## 2019-01-04 DIAGNOSIS — Z952 Presence of prosthetic heart valve: Secondary | ICD-10-CM

## 2019-01-04 DIAGNOSIS — I872 Venous insufficiency (chronic) (peripheral): Secondary | ICD-10-CM

## 2019-01-04 DIAGNOSIS — I330 Acute and subacute infective endocarditis: Secondary | ICD-10-CM

## 2019-01-04 MED ORDER — AMOXICILLIN 500 MG PO CAPS: 500.0000 mg | ORAL_CAPSULE | Freq: Three times a day (TID) | ORAL | 1 refills | Status: DC

## 2019-01-04 NOTE — Patient Instructions (Signed)
It appears that your endocarditis has been cured!   You had a strep species that I suspect came from a skin ulcer/cut and settled on your heart valve.   Careful attention to wounds, proper foot ware, changing wet socks and catching cellulitis early are what I want you to focus on   If you suspect a bout of cellulitis (pain/tingling and swelling/redness of the skin with warmth) start taking amoxicillin 1 tablet three times a day for 3-5 days until it is improved. This will allow you to stay on top of treatment.   Happy to see you in the future but you no longer need to be seen here.

## 2019-01-05 ENCOUNTER — Ambulatory Visit (INDEPENDENT_AMBULATORY_CARE_PROVIDER_SITE_OTHER): Payer: Managed Care, Other (non HMO) | Admitting: Pharmacist Clinician (PhC)/ Clinical Pharmacy Specialist

## 2019-01-05 DIAGNOSIS — Z7901 Long term (current) use of anticoagulants: Secondary | ICD-10-CM

## 2019-01-05 DIAGNOSIS — I4892 Unspecified atrial flutter: Secondary | ICD-10-CM

## 2019-01-05 DIAGNOSIS — Z954 Presence of other heart-valve replacement: Secondary | ICD-10-CM

## 2019-01-05 LAB — POCT INR: INR: 4 — AB (ref 2.0–3.0)

## 2019-01-16 ENCOUNTER — Other Ambulatory Visit: Payer: Self-pay | Admitting: Internal Medicine

## 2019-01-23 ENCOUNTER — Ambulatory Visit (INDEPENDENT_AMBULATORY_CARE_PROVIDER_SITE_OTHER): Payer: Managed Care, Other (non HMO) | Admitting: Pharmacist

## 2019-01-23 DIAGNOSIS — Z7901 Long term (current) use of anticoagulants: Secondary | ICD-10-CM | POA: Diagnosis not present

## 2019-01-23 DIAGNOSIS — I4892 Unspecified atrial flutter: Secondary | ICD-10-CM

## 2019-01-23 DIAGNOSIS — Z954 Presence of other heart-valve replacement: Secondary | ICD-10-CM | POA: Diagnosis not present

## 2019-01-23 LAB — POCT INR: INR: 4.6 — AB (ref 2.0–3.0)

## 2019-01-29 ENCOUNTER — Encounter (HOSPITAL_COMMUNITY): Payer: Self-pay | Admitting: Internal Medicine

## 2019-01-29 ENCOUNTER — Ambulatory Visit (HOSPITAL_COMMUNITY)
Admission: RE | Admit: 2019-01-29 | Discharge: 2019-01-29 | Disposition: A | Discharge status: Home / Self Care | Payer: Managed Care, Other (non HMO) | Source: Ambulatory Visit | Attending: Internal Medicine | Admitting: Internal Medicine

## 2019-01-29 VITALS — BP 132/90 | HR 72 | Wt 251.8 lb

## 2019-01-29 DIAGNOSIS — Z8674 Personal history of sudden cardiac arrest: Secondary | ICD-10-CM | POA: Insufficient documentation

## 2019-01-29 DIAGNOSIS — Z86718 Personal history of other venous thrombosis and embolism: Secondary | ICD-10-CM | POA: Diagnosis not present

## 2019-01-29 DIAGNOSIS — Z954 Presence of other heart-valve replacement: Secondary | ICD-10-CM

## 2019-01-29 DIAGNOSIS — Z952 Presence of prosthetic heart valve: Secondary | ICD-10-CM | POA: Diagnosis not present

## 2019-01-29 DIAGNOSIS — I059 Rheumatic mitral valve disease, unspecified: Secondary | ICD-10-CM

## 2019-01-29 DIAGNOSIS — Z6839 Body mass index (BMI) 39.0-39.9, adult: Secondary | ICD-10-CM | POA: Diagnosis not present

## 2019-01-29 DIAGNOSIS — Z7982 Long term (current) use of aspirin: Secondary | ICD-10-CM | POA: Insufficient documentation

## 2019-01-29 DIAGNOSIS — G4733 Obstructive sleep apnea (adult) (pediatric): Secondary | ICD-10-CM | POA: Diagnosis not present

## 2019-01-29 DIAGNOSIS — E669 Obesity, unspecified: Secondary | ICD-10-CM | POA: Diagnosis not present

## 2019-01-29 DIAGNOSIS — I10 Essential (primary) hypertension: Secondary | ICD-10-CM | POA: Insufficient documentation

## 2019-01-29 DIAGNOSIS — Z79899 Other long term (current) drug therapy: Secondary | ICD-10-CM | POA: Insufficient documentation

## 2019-01-29 DIAGNOSIS — I890 Lymphedema, not elsewhere classified: Secondary | ICD-10-CM | POA: Insufficient documentation

## 2019-01-29 DIAGNOSIS — I340 Nonrheumatic mitral (valve) insufficiency: Secondary | ICD-10-CM | POA: Diagnosis not present

## 2019-01-29 DIAGNOSIS — Z7901 Long term (current) use of anticoagulants: Secondary | ICD-10-CM | POA: Diagnosis not present

## 2019-01-29 DIAGNOSIS — J449 Chronic obstructive pulmonary disease, unspecified: Secondary | ICD-10-CM | POA: Diagnosis not present

## 2019-01-29 DIAGNOSIS — R7881 Bacteremia: Secondary | ICD-10-CM | POA: Insufficient documentation

## 2019-01-29 DIAGNOSIS — I484 Atypical atrial flutter: Secondary | ICD-10-CM | POA: Diagnosis present

## 2019-01-29 DIAGNOSIS — I4891 Unspecified atrial fibrillation: Secondary | ICD-10-CM | POA: Diagnosis not present

## 2019-01-29 DIAGNOSIS — I058 Other rheumatic mitral valve diseases: Secondary | ICD-10-CM | POA: Diagnosis not present

## 2019-01-29 DIAGNOSIS — I5022 Chronic systolic (congestive) heart failure: Secondary | ICD-10-CM

## 2019-01-29 DIAGNOSIS — Z87891 Personal history of nicotine dependence: Secondary | ICD-10-CM | POA: Insufficient documentation

## 2019-01-29 DIAGNOSIS — I2510 Atherosclerotic heart disease of native coronary artery without angina pectoris: Secondary | ICD-10-CM | POA: Insufficient documentation

## 2019-01-29 MED ORDER — FUROSEMIDE 40 MG PO TABS: 40.0000 mg | ORAL_TABLET | ORAL | 2 refills | Status: DC

## 2019-01-29 NOTE — Patient Instructions (Signed)
Dr Haroldine Laws recommends that you follow up with [place] in 6 months (Sept 2020).  PLEASE CALL OUR OFFICE IN July TO SCHEDULE AN APPOINTMENT

## 2019-01-29 NOTE — Progress Notes (Signed)
Patient ID: Oscar Brown, male   DOB: [birth year], 53 y.o.   MRN: [id number]     Advanced Heart Failure Clinic Note     Cardiologist: Dr Haroldine Laws  PCP: [name]  HPI: Oscar Brown is a 53 y.o. male  with OSA and mitral regurgitation. 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 On POD #3  he had a sudden VF arrest with nearly an hour of resuscitation time. Follow-up transthoracic and transesophageal echocardiograms performed after the arrest revealed normal function of the mechanical prosthetic valve with severe dysfunction involving the inferior wall of the left ventricle EF35-40%. Cardiac enzymes went quite high and he had some bradycardia and AV block, all suggestive of possible inferior wall myocardial infarction. However, left ventricular function completely normalized prior to hospital discharge. Plans for diagnostic cardiac catheterization were postponed because the patient developed acute non-oliguric renal failure with creatinine up to 7.Because the patient's rhythm and left ventricular systolic function recovered completely, it was felt that the patient did not need defibrillator implantation. The patient was ultimately discharged from the hospital on the 18th postoperative day.   Underwent successful RFA of AFL 03/20/18 with Dr. Lovena Le   Admitted 12/4 - 11/07/18 with LLE cellulitis. Started on empiric ABX. BCx positive for Streptococcus agalactiae. With mechanical MV, taken for TEE which showed highly mobile echo-density attached to the anterior portion of the sewing ring of the mechanical mitral valve measuring 12x5 mm consistent with prosthesis vegetation. Pt thought not to need surgical intervention. PICC was placed to continue 6 week course of IV ABX per ID. Also noted to have chronic DVT. Coumadin continued, no other change.     Had TEE 12/13/2018 vegetation resolved. Valve working well.   Here for f/u. Says he is back to normal. Working FT at [employer]. No SOB, fevers or chills. No edema. No problem with meds.    TEE 12/13/2018 LEFT VENTRICLE: EF = 55-60%. No regional wall motion abnormalities. LEFT ATRIUM: Dilated LEFT ATRIAL APPENDAGE: No thrombus.  AORTIC VALVE:  Trileaflet. No AI/AS. No vegetation. MITRAL VALVE:    Mechanical MV. Functioning well. No MR. Mild MS mean gradient 76mmHG. Previously seen vegetation no longer present  TRICUSPID VALVE: Normal. Mild TR. No vegetation. PULMONIC VALVE: Grossly normal. No vegetation. INTERATRIAL SEPTUM: No PFO or ASD. DESCENDING AORTA: Mild plaque  TEE 11/03/18 There is a highly mobile echodensity attached to the anterior portion of the sewing ring of the mechanical mitral valve measuring 12 x 5 mm consistent with prosthesis vegetation.   Echo 02/20/18 EF ~50% inferior HK RV mild to moderately HK. Septal flattening.   Echo 2/17 EF 45-50% inferior HK. MV ok   Review of systems complete and found to be negative unless listed in HPI.   SH:  Social History   Socioeconomic History  . Marital status: Legally Separated    Spouse name: Not on file  . Number of children: 3  . Years of education: Not on file  . Highest education level: Not on file  Occupational History  . Not on file  Social Needs  . Financial resource strain: Not on file  . Food insecurity:    Worry: Not on file    Inability: Not on file  . Transportation needs:    Medical: Not on file    Non-medical: Not on file  Tobacco Use  . Smoking status: Former  Smoker    Packs/day: 0.10    Years: 9.00    Pack years: 0.90    Types: Cigarettes    Last attempt to quit: 09/05/2012    Years since quitting: 6.4  . Smokeless tobacco: Never Used  Substance and Sexual Activity  . Alcohol use: Yes    Alcohol/week: 0.0 standard drinks    Comment: 1 beer once a week or special  occasions  . Drug use: No  . Sexual activity: Yes  Lifestyle  . Physical activity:    Days per week: Not on file    Minutes per session: Not on file  . Stress: Not on file  Relationships  . Social connections:    Talks on phone: Not on file    Gets together: Not on file    Attends religious service: Not on file    Active member of club or organization: Not on file    Attends meetings of clubs or organizations: Not on file    Relationship status: Not on file  . Intimate partner violence:    Fear of current or ex partner: Not on file    Emotionally abused: Not on file    Physically abused: Not on file    Forced sexual activity: Not on file  Other Topics Concern  . Not on file  Social History Narrative  . Not on file   FH:  - Denies history of sudden cardiac death.  Past Medical History:  Diagnosis Date  . Acute diastolic congestive heart failure ([place]) 09/21/2015   takes Lasix daily  . Acute renal failure ([facility])    ATN s/p cardiac arrest  . Atrial fibrillation ([place])   . 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  Complicated by chronic left lower extremity edema and occasional recurrent left lower extremity cellulitis   . COPD (chronic obstructive pulmonary disease) ([place])   . DVT (deep venous thrombosis) ([place]) early 80's   left leg  . HLD (hyperlipidemia)   . Incidental lung nodule, less than or equal to 30mm 11/10/2015   3 mm nodule right lung  . Lymphedema early 85's  . Mitral regurgitation 10/06/2012  . Obstructive sleep apnea 12/14/2006   AHI 19.2/hr, desaturation to 75% on Polysomnography 02/26/2005.  Uses 11 cm H2O nocturnal nasal CPAP with good symptom control.   . S/P minimally invasive mitral valve replacement with metallic valve 36/14/4315   33 mm Carbomedics Optiform bileaflet mechanical prosthesis placed via right mini-thoracotomy approach    Current Outpatient Medications  Medication Sig Dispense Refill  . aspirin (ASPIR-81) 81  MG EC tablet Take 1 tablet (81 mg total) by mouth daily. Swallow whole. 30 tablet 12  . furosemide (LASIX) 40 MG tablet One tab twice a week. (Patient taking differently: Take 40 mg by mouth See admin instructions. Take 40 mg by mouth twice weekly on Monday and Friday) 10 tablet 6  . rosuvastatin (CRESTOR) 20 MG tablet Take 20 mg by mouth daily.    Marland Kitchen warfarin (COUMADIN) 5 MG tablet TAKE ONE AND ONE-HALF (1  1/2) TABLET TO TWO TABLETS BY MOUTH DAILY AS DIRECTED BY COUMADIN CLINIC 180 tablet 0   No current facility-administered medications for this encounter.    Vitals:   01/29/19 1437  BP: 132/90  Pulse: 72  SpO2: 98%  Weight: 114.2 kg (251 lb 12.8 oz)     Wt Readings from Last 3 Encounters:  01/29/19 114.2 kg (251 lb 12.8 oz)  01/04/19 112 kg (  247 lb)  12/14/18 114.2 kg (251 lb 12.8 oz)    PHYSICAL EXAM: General:  Well appearing. No resp difficulty HEENT: normal Neck: supple. no JVD. Carotids 2+ bilat; no bruits. No lymphadenopathy or thryomegaly appreciated. Cor: PMI nondisplaced. Regular rate & rhythm. Mechanical s1 crisp  Lungs: clear Abdomen: soft, nontender, nondistended. No hepatosplenomegaly. No bruits or masses. Good bowel sounds. Extremities: no cyanosis, clubbing, rash, edema + compression hose  Neuro: alert & orientedx3, cranial nerves grossly intact. moves all 4 extremities w/o difficulty. Affect pleasant   ASSESSMENT & PLAN:  1. Recurrent A flutter, atypical - s/p DCCV 12/23/17. - s/p Aflutter ablation 03/20/18  - In NSR today - On coumadin for MVR. Denies bleeding.  2. Mitral regurgitation s/p mechanical MVR - Normal EF and stable without vegetation on TEE 12/13/2018. - Continue coumadin per coumadin clinic.  - Has resultant RV strain due longstanding MV disease - Reinforced need for SBE prophylaxis 3. 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 - Completed gentamicin therapy 11/17/18 and IV PCN 12/17/2018 - Saw ID  01/04/19 - F/u TEE 1/20 no vegetation. Valve functioning well.  4. Prolonged in-hospital post-cardiac arrest - VT/VF - No change.  5. Chronic DVT - Continue coumadin as above.No change.  6. Lymphedema in left leg - Continue lasix 40 mg twice weekly. BMET stable 12/14/18 - Continue compression hose. He wraps with any exacerbation with relief. Does a great job of managing himself.  7. HTN - Blood pressure well controlled. Continue current regimen. 8. CAD - Cath 12/16 distal LCx 45% - No s/s ischemia.   - Continue ASA/statin 9. Obesity - Body mass index is 39.44 kg/m.  - Encouraged weight loss and exercise.  10. Health Screening - He is due for screening colonoscopy. He has normal EF. Would need lovenox coverage for INR off coumadin, but otherwise should be fine to proceed.   Glori Bickers, MD  3:11 PM

## 2019-01-29 NOTE — Addendum Note (Signed)
Encounter addended by: Scarlette Calico, RN on: 01/29/2019 3:16 PM  Actions taken: Order list changed

## 2019-01-30 ENCOUNTER — Other Ambulatory Visit (HOSPITAL_COMMUNITY): Payer: Self-pay | Admitting: Family Medicine

## 2019-02-07 ENCOUNTER — Other Ambulatory Visit (HOSPITAL_COMMUNITY): Payer: Self-pay | Admitting: Student

## 2019-02-09 ENCOUNTER — Ambulatory Visit (INDEPENDENT_AMBULATORY_CARE_PROVIDER_SITE_OTHER): Payer: Managed Care, Other (non HMO) | Admitting: *Deleted

## 2019-02-09 ENCOUNTER — Other Ambulatory Visit: Payer: Self-pay

## 2019-02-09 DIAGNOSIS — I4892 Unspecified atrial flutter: Secondary | ICD-10-CM

## 2019-02-09 DIAGNOSIS — Z7901 Long term (current) use of anticoagulants: Secondary | ICD-10-CM

## 2019-02-09 DIAGNOSIS — Z954 Presence of other heart-valve replacement: Secondary | ICD-10-CM

## 2019-02-09 LAB — POCT INR: INR: 2.8 (ref 2.0–3.0)

## 2019-02-09 NOTE — Patient Instructions (Signed)
Description   Continue taking  1.5 tablets daily Repeat INR in 2 weeks

## 2019-03-01 ENCOUNTER — Telehealth: Payer: Self-pay

## 2019-03-01 NOTE — Telephone Encounter (Signed)
lmom for prescreen/drive thru 

## 2019-03-02 ENCOUNTER — Ambulatory Visit (INDEPENDENT_AMBULATORY_CARE_PROVIDER_SITE_OTHER): Payer: Managed Care, Other (non HMO) | Admitting: Pharmacist

## 2019-03-02 ENCOUNTER — Other Ambulatory Visit: Payer: Self-pay

## 2019-03-02 DIAGNOSIS — Z954 Presence of other heart-valve replacement: Secondary | ICD-10-CM

## 2019-03-02 DIAGNOSIS — Z7901 Long term (current) use of anticoagulants: Secondary | ICD-10-CM | POA: Diagnosis not present

## 2019-03-02 DIAGNOSIS — I4892 Unspecified atrial flutter: Secondary | ICD-10-CM

## 2019-03-02 LAB — POCT INR: INR: 3.2 — AB (ref 2.0–3.0)

## 2019-03-29 ENCOUNTER — Telehealth: Payer: Self-pay

## 2019-03-29 NOTE — Telephone Encounter (Signed)

## 2019-03-29 NOTE — Telephone Encounter (Signed)
lmom for prescreen  

## 2019-03-30 ENCOUNTER — Other Ambulatory Visit: Payer: Self-pay

## 2019-03-30 ENCOUNTER — Ambulatory Visit (INDEPENDENT_AMBULATORY_CARE_PROVIDER_SITE_OTHER): Payer: Managed Care, Other (non HMO) | Admitting: *Deleted

## 2019-03-30 DIAGNOSIS — I4892 Unspecified atrial flutter: Secondary | ICD-10-CM

## 2019-03-30 DIAGNOSIS — Z7901 Long term (current) use of anticoagulants: Secondary | ICD-10-CM

## 2019-03-30 LAB — POCT INR: INR: 2.6 (ref 2.0–3.0)

## 2019-03-30 NOTE — Patient Instructions (Signed)
Description   Spoke with pt and instructed to continue taking 1.5 tablets daily Repeat INR in 6 weeks at [facility] ([place] pt).

## 2019-04-25 ENCOUNTER — Telehealth: Payer: Self-pay | Admitting: Pharmacist

## 2019-04-25 NOTE — Telephone Encounter (Signed)
LMOM - need to move next INR appt from [facility] to [place] office.

## 2019-05-03 ENCOUNTER — Telehealth: Payer: Self-pay

## 2019-05-03 NOTE — Telephone Encounter (Signed)
lmom for prescreen  

## 2019-05-09 NOTE — Telephone Encounter (Signed)
1. COVID-19 Pre-Screening Questions:  . In the past 7 to 10 days have you had a cough,  shortness of breath, headache, congestion, fever (100 or greater) body aches, chills, sore throat, or sudden loss of taste or sense of smell?  NO . Have you been around anyone with known Covid 19.  NO . Have you been around anyone who is awaiting Covid 19 test results in the past 7 to 10 days?  no . Have you been around anyone who has been exposed to Covid 19, or has mentioned symptoms of Covid 19 within the past 7 to 10 days?  no    2. Pt advised of visitor restrictions (no visitors allowed except if needed to conduct the visit). Also advised to arrive at appointment time and wear a mask.

## 2019-05-11 ENCOUNTER — Other Ambulatory Visit: Payer: Self-pay

## 2019-05-11 ENCOUNTER — Ambulatory Visit (INDEPENDENT_AMBULATORY_CARE_PROVIDER_SITE_OTHER): Payer: Managed Care, Other (non HMO) | Admitting: *Deleted

## 2019-05-11 DIAGNOSIS — I4892 Unspecified atrial flutter: Secondary | ICD-10-CM | POA: Diagnosis not present

## 2019-05-11 DIAGNOSIS — Z7901 Long term (current) use of anticoagulants: Secondary | ICD-10-CM

## 2019-05-11 DIAGNOSIS — Z954 Presence of other heart-valve replacement: Secondary | ICD-10-CM

## 2019-05-11 LAB — POCT INR: INR: 1.7 — AB (ref 2.0–3.0)

## 2019-05-11 NOTE — Patient Instructions (Addendum)
   Description   Today take 2 tablets, tomorrow take 2 tablets then continue taking 1.5 tablets daily Repeat INR in  2- 3 weeks .

## 2019-05-23 ENCOUNTER — Telehealth: Payer: Self-pay

## 2019-05-23 NOTE — Telephone Encounter (Signed)
lmom for prescreen  

## 2019-05-31 ENCOUNTER — Other Ambulatory Visit: Payer: Self-pay

## 2019-05-31 ENCOUNTER — Ambulatory Visit (INDEPENDENT_AMBULATORY_CARE_PROVIDER_SITE_OTHER): Payer: Managed Care, Other (non HMO)

## 2019-05-31 DIAGNOSIS — I4892 Unspecified atrial flutter: Secondary | ICD-10-CM | POA: Diagnosis not present

## 2019-05-31 DIAGNOSIS — Z954 Presence of other heart-valve replacement: Secondary | ICD-10-CM

## 2019-05-31 DIAGNOSIS — Z7901 Long term (current) use of anticoagulants: Secondary | ICD-10-CM

## 2019-05-31 LAB — POCT INR: INR: 5.3 — AB (ref 2.0–3.0)

## 2019-05-31 NOTE — Patient Instructions (Signed)
Description   Skip today and tomorrow's dosage of Coumadin, then resume same dosage 1.5 tablets daily Repeat INR in  2 weeks.

## 2019-06-07 ENCOUNTER — Telehealth: Payer: Self-pay

## 2019-06-07 NOTE — Telephone Encounter (Signed)
lmom for prescreen  

## 2019-06-08 ENCOUNTER — Other Ambulatory Visit: Payer: Self-pay | Admitting: *Deleted

## 2019-06-18 ENCOUNTER — Ambulatory Visit (INDEPENDENT_AMBULATORY_CARE_PROVIDER_SITE_OTHER): Payer: Managed Care, Other (non HMO) | Admitting: Pharmacist

## 2019-06-18 ENCOUNTER — Other Ambulatory Visit: Payer: Self-pay

## 2019-06-18 DIAGNOSIS — Z954 Presence of other heart-valve replacement: Secondary | ICD-10-CM

## 2019-06-18 DIAGNOSIS — I4892 Unspecified atrial flutter: Secondary | ICD-10-CM

## 2019-06-18 DIAGNOSIS — Z7901 Long term (current) use of anticoagulants: Secondary | ICD-10-CM | POA: Diagnosis not present

## 2019-06-18 LAB — POCT INR: INR: 5.6 — AB (ref 2.0–3.0)

## 2019-06-21 DIAGNOSIS — E785 Hyperlipidemia, unspecified: Secondary | ICD-10-CM | POA: Insufficient documentation

## 2019-06-29 ENCOUNTER — Telehealth: Payer: Self-pay | Admitting: *Deleted

## 2019-06-29 ENCOUNTER — Telehealth: Payer: Self-pay

## 2019-06-29 NOTE — Telephone Encounter (Signed)

## 2019-06-29 NOTE — Telephone Encounter (Signed)
LMOM FOR SCREEN

## 2019-07-01 ENCOUNTER — Other Ambulatory Visit (HOSPITAL_COMMUNITY): Payer: Self-pay | Admitting: Internal Medicine

## 2019-07-02 ENCOUNTER — Ambulatory Visit (INDEPENDENT_AMBULATORY_CARE_PROVIDER_SITE_OTHER): Payer: Managed Care, Other (non HMO) | Admitting: Pharmacist

## 2019-07-02 ENCOUNTER — Other Ambulatory Visit: Payer: Self-pay

## 2019-07-02 DIAGNOSIS — I4892 Unspecified atrial flutter: Secondary | ICD-10-CM

## 2019-07-02 DIAGNOSIS — Z7901 Long term (current) use of anticoagulants: Secondary | ICD-10-CM

## 2019-07-02 DIAGNOSIS — Z954 Presence of other heart-valve replacement: Secondary | ICD-10-CM | POA: Diagnosis not present

## 2019-07-02 LAB — POCT INR: INR: 2.7 (ref 2.0–3.0)

## 2019-07-19 ENCOUNTER — Encounter (HOSPITAL_COMMUNITY): Payer: Self-pay | Admitting: Emergency Medicine

## 2019-07-19 ENCOUNTER — Emergency Department (HOSPITAL_COMMUNITY)
Admission: EM | Admit: 2019-07-19 | Discharge: 2019-07-19 | Disposition: A | Discharge status: Home / Self Care | Payer: Managed Care, Other (non HMO) | Attending: Emergency Medicine | Admitting: Emergency Medicine

## 2019-07-19 ENCOUNTER — Other Ambulatory Visit: Payer: Self-pay

## 2019-07-19 DIAGNOSIS — Z79899 Other long term (current) drug therapy: Secondary | ICD-10-CM | POA: Insufficient documentation

## 2019-07-19 DIAGNOSIS — T63443A Toxic effect of venom of bees, assault, initial encounter: Secondary | ICD-10-CM

## 2019-07-19 DIAGNOSIS — I4891 Unspecified atrial fibrillation: Secondary | ICD-10-CM | POA: Diagnosis not present

## 2019-07-19 DIAGNOSIS — Z7901 Long term (current) use of anticoagulants: Secondary | ICD-10-CM | POA: Insufficient documentation

## 2019-07-19 DIAGNOSIS — T63441A Toxic effect of venom of bees, accidental (unintentional), initial encounter: Secondary | ICD-10-CM | POA: Insufficient documentation

## 2019-07-19 DIAGNOSIS — I5032 Chronic diastolic (congestive) heart failure: Secondary | ICD-10-CM | POA: Diagnosis not present

## 2019-07-19 DIAGNOSIS — S0086XA Insect bite (nonvenomous) of other part of head, initial encounter: Secondary | ICD-10-CM | POA: Diagnosis present

## 2019-07-19 DIAGNOSIS — Z86718 Personal history of other venous thrombosis and embolism: Secondary | ICD-10-CM | POA: Diagnosis not present

## 2019-07-19 DIAGNOSIS — Y939 Activity, unspecified: Secondary | ICD-10-CM | POA: Insufficient documentation

## 2019-07-19 DIAGNOSIS — Z87891 Personal history of nicotine dependence: Secondary | ICD-10-CM | POA: Insufficient documentation

## 2019-07-19 DIAGNOSIS — Z7982 Long term (current) use of aspirin: Secondary | ICD-10-CM | POA: Insufficient documentation

## 2019-07-19 DIAGNOSIS — S60561A Insect bite (nonvenomous) of right hand, initial encounter: Secondary | ICD-10-CM | POA: Insufficient documentation

## 2019-07-19 DIAGNOSIS — W57XXXA Bitten or stung by nonvenomous insect and other nonvenomous arthropods, initial encounter: Secondary | ICD-10-CM | POA: Insufficient documentation

## 2019-07-19 DIAGNOSIS — J449 Chronic obstructive pulmonary disease, unspecified: Secondary | ICD-10-CM | POA: Diagnosis not present

## 2019-07-19 DIAGNOSIS — S60562A Insect bite (nonvenomous) of left hand, initial encounter: Secondary | ICD-10-CM | POA: Insufficient documentation

## 2019-07-19 DIAGNOSIS — Y999 Unspecified external cause status: Secondary | ICD-10-CM | POA: Insufficient documentation

## 2019-07-19 DIAGNOSIS — Y929 Unspecified place or not applicable: Secondary | ICD-10-CM | POA: Diagnosis not present

## 2019-07-19 MED ORDER — PREDNISONE 10 MG PO TABS: 40.0000 mg | ORAL_TABLET | Freq: Every day | ORAL | 0 refills | Status: AC

## 2019-07-19 NOTE — Discharge Instructions (Addendum)
Take benadryl 25mg  every 6 hours for 2 days, then take every 6 hours as needed for itching. Take tylenol for pain and ice the areas of swelling.  If the benadryl is too sedating, you may take a medication like loratadine or cetirizine in the AM and take 1 benadryl at night.  You may also take famotidine OR ranitidine in addition to these medications.  We hope you feel better soon!  Happy early birthday!

## 2019-07-19 NOTE — ED Triage Notes (Signed)
Pt reports being stung multiple times by yellow jackets at approx 5pm today. States that pain and swelling is worse so he came to ED. Localized swelling to right eye, RFA, and left arm from stings. Denies SOB, NAD noted at this time.

## 2019-07-19 NOTE — ED Provider Notes (Signed)
[facility] EMERGENCY DEPARTMENT Provider Note   CSN: [id number] Arrival date & time: 07/19/19  0001     History   Chief Complaint Chief Complaint  Patient presents with  . Insect Bite  . Allergic Reaction    HPI Oscar Brown is a 53 y.o. male.     HPI   53yo male presents with concern for swelling of right face, bilateral hands after multiple bee stings yesterday.  Was stung on hands, face and has had increasing swelling.  No shortness of breath, lightheadedness, nausea, vomiting, diarrhea, abdominal pain, tongue or throat swelling. No hx of anaphylaxis. Sings occurred as he was cutting weeds yesterday. Reports being stung multiple times. Has not taken anything for it. Has localized itching and pain/swelling. No other rashes.  Past Medical History:  Diagnosis Date  . Acute diastolic congestive heart failure ([place]) 09/21/2015   takes Lasix daily  . Acute renal failure ([facility])    ATN s/p cardiac arrest  . Atrial fibrillation ([place])   . 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  Complicated by chronic left lower extremity edema and occasional recurrent left lower extremity cellulitis   . COPD (chronic obstructive pulmonary disease) ([place])   . DVT (deep venous thrombosis) ([place]) early 80's   left leg  . HLD (hyperlipidemia)   . Incidental lung nodule, less than or equal to 58mm 11/10/2015   3 mm nodule right lung  . Lymphedema early 11's  . Mitral regurgitation 10/06/2012  . Obstructive sleep apnea 12/14/2006   AHI 19.2/hr, desaturation to 75% on Polysomnography 02/26/2005.  Uses 11 cm H2O nocturnal nasal CPAP with good symptom control.   . S/P minimally invasive mitral valve replacement with metallic valve 99/24/2683   33 mm Carbomedics Optiform bileaflet mechanical prosthesis placed via right mini-thoracotomy approach    Patient Active Problem List   Diagnosis Date Noted  . Medication monitoring encounter 12/06/2018  .  Acute bacterial endocarditis   . H/O mitral valve replacement with mechanical valve   . Essential hypertension   . Coronary artery disease involving native coronary artery of native heart without angina pectoris 12/23/2016  . Long term (current) use of anticoagulants [Z79.01] 12/05/2015  . Atrial flutter ([place])   . History of cardiac arrest   . Incidental lung nodule, less than or equal to 43mm 11/10/2015  . Tobacco use disorder 06/05/2015  . Preventative health care 10/06/2012  . Obesity, Class I, BMI 30-34.9 12/14/2006  . OSA (obstructive sleep apnea) 12/14/2006  . Chronic venous insufficiency 12/14/2006    Past Surgical History:  Procedure Laterality Date  . A-FLUTTER ABLATION N/A 03/20/2018   Procedure: A-FLUTTER ABLATION;  Surgeon: Evans Lance, MD;  Location: [facility];  Service: Cardiovascular;  Laterality: N/A;  . CARDIAC CATHETERIZATION N/A 11/03/2015   Procedure: Right/Left Heart Cath and Coronary Angiography;  Surgeon: Leonie Man, MD;  Location: [facility];  Service: Cardiovascular;  Laterality: N/A;  . CARDIOVERSION N/A 12/23/2017   Procedure: CARDIOVERSION;  Surgeon: Thayer Headings, MD;  Location: [facility] ENDOSCOPY;  Service: Cardiovascular;  Laterality: N/A;  . MITRAL VALVE REPLACEMENT Right 11/13/2015   Procedure: MINIMALLY INVASIVE MITRAL VALVE (MV) REPLACEMENT;  Surgeon: [name], MD;  Location: [facility];  Service: Open Heart Surgery;  Laterality: Right;  . MULTIPLE EXTRACTIONS WITH ALVEOLOPLASTY N/A 11/07/2015   Procedure: Extraction of tooth #'s 1,2,8,9,12,15,16,17, 32 with alveoloplasty and gross debridement of remaining teeth.;  Surgeon: Pete Glatter  Kulinski, DDS;  Location: [facility];  Service: Oral Surgery;  Laterality: N/A;  . TEE WITHOUT CARDIOVERSION N/A 10/16/2015   Procedure: TRANSESOPHAGEAL ECHOCARDIOGRAM (TEE);  Surgeon: Skeet Latch, MD;  Location: [facility];  Service: Cardiovascular;  Laterality: N/A;  . TEE WITHOUT CARDIOVERSION N/A  11/13/2015   Procedure: TRANSESOPHAGEAL ECHOCARDIOGRAM (TEE);  Surgeon: [name], MD;  Location: [facility];  Service: Open Heart Surgery;  Laterality: N/A;  . TEE WITHOUT CARDIOVERSION N/A 11/03/2018   Procedure: TRANSESOPHAGEAL ECHOCARDIOGRAM (TEE);  Surgeon: Dorothy Spark, MD;  Location: [facility] ENDOSCOPY;  Service: Cardiovascular;  Laterality: N/A;  . TEE WITHOUT CARDIOVERSION N/A 12/13/2018   Procedure: TRANSESOPHAGEAL ECHOCARDIOGRAM (TEE);  Surgeon: Jolaine Artist, MD;  Location: [facility] ENDOSCOPY;  Service: Cardiovascular;  Laterality: N/A;        Home Medications    Prior to Admission medications   Medication Sig Start Date End Date Taking? Authorizing Provider  aspirin (ASPIR-81) 81 MG EC tablet Take 1 tablet (81 mg total) by mouth daily. Swallow whole. 12/16/15   Bensimhon, Shaune Pascal, MD  furosemide (LASIX) 40 MG tablet TAKE 1 TABLET BY MOUTH TWICE WEEKLY ON MONDAY AND FRIDAY 07/02/19   Bensimhon, Shaune Pascal, MD  predniSONE (DELTASONE) 10 MG tablet Take 4 tablets (40 mg total) by mouth daily for 4 days. 07/19/19 07/23/19  Gareth Morgan, MD  rosuvastatin (CRESTOR) 20 MG tablet Take 20 mg by mouth daily.    [provider]  warfarin (COUMADIN) 5 MG tablet TAKE ONE AND ONE-HALF (1  1/2) TABLET TO TWO TABLETS BY MOUTH DAILY AS DIRECTED BY [facility] 01/16/19   Evans Lance, MD    Family History Family History  Problem Relation Age of Onset  . Cancer Mother        type unknown but had a colostomy bag  . Other Father        bone disease  . Diabetes Paternal Aunt     Social History Social History   Tobacco Use  . Smoking status: Former Smoker    Packs/day: 0.10    Years: 9.00    Pack years: 0.90    Types: Cigarettes    Quit date: 09/05/2012    Years since quitting: 6.8  . Smokeless tobacco: Never Used  Substance Use Topics  . Alcohol use: Yes    Alcohol/week: 0.0 standard drinks    Comment: 1 beer once a week or special occasions  . Drug use: No      Allergies   Iohexol   Review of Systems Review of Systems  Constitutional: Negative for fever.  HENT: Positive for facial swelling. Negative for trouble swallowing and voice change.   Respiratory: Negative for shortness of breath.   Cardiovascular: Negative for chest pain.  Gastrointestinal: Negative for abdominal pain, nausea and vomiting.  Musculoskeletal: Positive for joint swelling.  Skin: Rash: swelling, no rash.  Neurological: Negative for light-headedness.     Physical Exam Updated Vital Signs BP (!) 144/94 (BP Location: Right Arm)   Pulse (!) 51   Temp 98.3 F (36.8 C) (Oral)   Resp 18   SpO2 100%   Physical Exam Vitals signs and nursing note reviewed.  Constitutional:      General: He is not in acute distress.    Appearance: He is well-developed. He is not diaphoretic.  HENT:     Head: Normocephalic.     Comments: Right periorbital swelling Eyes:     Conjunctiva/sclera: Conjunctivae normal.  Neck:     Musculoskeletal:  Normal range of motion.  Cardiovascular:     Rate and Rhythm: Normal rate and regular rhythm.     Pulses: Normal pulses.  Pulmonary:     Effort: Pulmonary effort is normal. No respiratory distress.     Breath sounds: Normal breath sounds. No wheezing or rales.  Abdominal:     General: There is no distension.     Palpations: Abdomen is soft.     Tenderness: There is no abdominal tenderness. There is no guarding.  Musculoskeletal:        General: Swelling (bilateral hands) present.  Skin:    General: Skin is warm and dry.  Neurological:     Mental Status: He is alert and oriented to person, place, and time.      ED Treatments / Results  Labs (all labs ordered are listed, but only abnormal results are displayed) Labs Reviewed - No data to display  EKG None  Radiology No results found.  Procedures Procedures (including critical care time)  Medications Ordered in ED Medications - No data to display   Initial Impression /  Assessment and Plan / ED Course  I have reviewed the triage vital signs and the nursing notes.  Pertinent labs & imaging results that were available during my care of the patient were reviewed by me and considered in my medical decision making (see chart for details).        53yo male with history above presents with concern for swelling of right face, bilateral hands after multiple bee stings yesterday.  Pt with multiple stings with localized reactions. No signs of anaphylaxis. Given rx for steroids, rec benadryl/H2 blocker. Patient discharged in stable condition with understanding of reasons to return.   Final Clinical Impressions(s) / ED Diagnoses   Final diagnoses:  Bee sting, assault, initial encounter  Local reaction to bee sting, assault, initial encounter    ED Discharge Orders         Ordered    predniSONE (DELTASONE) 10 MG tablet  Daily     07/19/19 0759           Gareth Morgan, MD 07/19/19 2231

## 2019-07-20 ENCOUNTER — Encounter: Payer: Self-pay | Admitting: Gastroenterology

## 2019-08-08 ENCOUNTER — Ambulatory Visit (INDEPENDENT_AMBULATORY_CARE_PROVIDER_SITE_OTHER): Payer: Managed Care, Other (non HMO) | Admitting: Pharmacist

## 2019-08-08 ENCOUNTER — Other Ambulatory Visit: Payer: Self-pay

## 2019-08-08 DIAGNOSIS — Z954 Presence of other heart-valve replacement: Secondary | ICD-10-CM | POA: Diagnosis not present

## 2019-08-08 DIAGNOSIS — I4892 Unspecified atrial flutter: Secondary | ICD-10-CM

## 2019-08-08 DIAGNOSIS — Z7901 Long term (current) use of anticoagulants: Secondary | ICD-10-CM | POA: Diagnosis not present

## 2019-08-08 LAB — POCT INR: INR: 1.6 — AB (ref 2.0–3.0)

## 2019-08-17 ENCOUNTER — Ambulatory Visit (INDEPENDENT_AMBULATORY_CARE_PROVIDER_SITE_OTHER): Payer: Managed Care, Other (non HMO) | Admitting: Pharmacist Clinician (PhC)/ Clinical Pharmacy Specialist

## 2019-08-17 ENCOUNTER — Other Ambulatory Visit: Payer: Self-pay

## 2019-08-17 DIAGNOSIS — Z954 Presence of other heart-valve replacement: Secondary | ICD-10-CM

## 2019-08-17 DIAGNOSIS — Z7901 Long term (current) use of anticoagulants: Secondary | ICD-10-CM

## 2019-08-17 DIAGNOSIS — I4892 Unspecified atrial flutter: Secondary | ICD-10-CM

## 2019-08-17 LAB — POCT INR: INR: 1.9 — AB (ref 2.0–3.0)

## 2019-08-29 ENCOUNTER — Ambulatory Visit: Payer: Managed Care, Other (non HMO) | Admitting: Gastroenterology

## 2019-09-07 ENCOUNTER — Other Ambulatory Visit: Payer: Self-pay

## 2019-09-07 ENCOUNTER — Ambulatory Visit (INDEPENDENT_AMBULATORY_CARE_PROVIDER_SITE_OTHER): Payer: Managed Care, Other (non HMO) | Admitting: Pharmacist Clinician (PhC)/ Clinical Pharmacy Specialist

## 2019-09-07 DIAGNOSIS — I4892 Unspecified atrial flutter: Secondary | ICD-10-CM | POA: Diagnosis not present

## 2019-09-07 DIAGNOSIS — Z7901 Long term (current) use of anticoagulants: Secondary | ICD-10-CM

## 2019-09-07 DIAGNOSIS — Z954 Presence of other heart-valve replacement: Secondary | ICD-10-CM

## 2019-09-07 LAB — POCT INR: INR: 2.1 (ref 2.0–3.0)

## 2019-09-08 ENCOUNTER — Other Ambulatory Visit: Payer: Self-pay | Admitting: Internal Medicine

## 2019-09-28 ENCOUNTER — Ambulatory Visit (INDEPENDENT_AMBULATORY_CARE_PROVIDER_SITE_OTHER): Payer: Managed Care, Other (non HMO) | Admitting: Pharmacist Clinician (PhC)/ Clinical Pharmacy Specialist

## 2019-09-28 ENCOUNTER — Other Ambulatory Visit: Payer: Self-pay

## 2019-09-28 DIAGNOSIS — Z954 Presence of other heart-valve replacement: Secondary | ICD-10-CM

## 2019-09-28 DIAGNOSIS — Z7901 Long term (current) use of anticoagulants: Secondary | ICD-10-CM

## 2019-09-28 DIAGNOSIS — I4892 Unspecified atrial flutter: Secondary | ICD-10-CM | POA: Diagnosis not present

## 2019-09-28 LAB — POCT INR: INR: 2.5 (ref 2.0–3.0)

## 2019-11-09 ENCOUNTER — Ambulatory Visit (INDEPENDENT_AMBULATORY_CARE_PROVIDER_SITE_OTHER): Payer: Managed Care, Other (non HMO) | Admitting: Pharmacist Clinician (PhC)/ Clinical Pharmacy Specialist

## 2019-11-09 ENCOUNTER — Other Ambulatory Visit: Payer: Self-pay

## 2019-11-09 DIAGNOSIS — Z954 Presence of other heart-valve replacement: Secondary | ICD-10-CM | POA: Diagnosis not present

## 2019-11-09 DIAGNOSIS — Z7901 Long term (current) use of anticoagulants: Secondary | ICD-10-CM

## 2019-11-09 DIAGNOSIS — I4892 Unspecified atrial flutter: Secondary | ICD-10-CM | POA: Diagnosis not present

## 2019-11-09 LAB — POCT INR: INR: 2.7 (ref 2.0–3.0)

## 2019-12-09 ENCOUNTER — Other Ambulatory Visit: Payer: Self-pay | Admitting: Internal Medicine

## 2019-12-21 ENCOUNTER — Ambulatory Visit (INDEPENDENT_AMBULATORY_CARE_PROVIDER_SITE_OTHER): Payer: Managed Care, Other (non HMO) | Admitting: Pharmacist Clinician (PhC)/ Clinical Pharmacy Specialist

## 2019-12-21 ENCOUNTER — Other Ambulatory Visit: Payer: Self-pay

## 2019-12-21 ENCOUNTER — Encounter: Payer: Self-pay | Admitting: Pharmacist Clinician (PhC)/ Clinical Pharmacy Specialist

## 2019-12-21 DIAGNOSIS — Z7901 Long term (current) use of anticoagulants: Secondary | ICD-10-CM | POA: Diagnosis not present

## 2019-12-21 DIAGNOSIS — Z954 Presence of other heart-valve replacement: Secondary | ICD-10-CM

## 2019-12-21 DIAGNOSIS — I4892 Unspecified atrial flutter: Secondary | ICD-10-CM

## 2019-12-21 LAB — POCT INR: INR: 3.2 — AB (ref 2.0–3.0)

## 2020-01-22 ENCOUNTER — Other Ambulatory Visit: Payer: Self-pay | Admitting: Internal Medicine

## 2020-02-01 ENCOUNTER — Ambulatory Visit (INDEPENDENT_AMBULATORY_CARE_PROVIDER_SITE_OTHER): Payer: Managed Care, Other (non HMO) | Admitting: Pharmacist Clinician (PhC)/ Clinical Pharmacy Specialist

## 2020-02-01 ENCOUNTER — Other Ambulatory Visit: Payer: Self-pay

## 2020-02-01 DIAGNOSIS — Z7901 Long term (current) use of anticoagulants: Secondary | ICD-10-CM

## 2020-02-01 DIAGNOSIS — I4892 Unspecified atrial flutter: Secondary | ICD-10-CM | POA: Diagnosis not present

## 2020-02-01 DIAGNOSIS — Z954 Presence of other heart-valve replacement: Secondary | ICD-10-CM

## 2020-02-01 LAB — POCT INR: INR: 2.8 (ref 2.0–3.0)

## 2020-02-09 ENCOUNTER — Ambulatory Visit: Payer: Managed Care, Other (non HMO) | Attending: Internal Medicine

## 2020-02-09 DIAGNOSIS — Z23 Encounter for immunization: Secondary | ICD-10-CM

## 2020-02-09 NOTE — Progress Notes (Signed)
   Covid-19 Vaccination Clinic  Name:  Oscar Brown    MRN: [id number] DOB: [birth year]  02/09/2020  Mr. Turso was observed post Covid-19 immunization for 15 minutes without incident. He was provided with Vaccine Information Sheet and instruction to access the V-Safe system.   Mr. Hutson was instructed to call 911 with any severe reactions post vaccine: Marland Kitchen Difficulty breathing  . Swelling of face and throat  . A fast heartbeat  . A bad rash all over body  . Dizziness and weakness   Immunizations Administered    Name Date Dose VIS Date Route   Pfizer COVID-19 Vaccine 02/09/2020  9:20 AM 0.3 mL 11/09/2019 Intramuscular   Manufacturer: [place]   Lot: [id number]   [place]: [id number]

## 2020-03-03 ENCOUNTER — Ambulatory Visit: Payer: Managed Care, Other (non HMO) | Attending: Internal Medicine

## 2020-03-03 ENCOUNTER — Ambulatory Visit: Payer: Self-pay

## 2020-03-03 DIAGNOSIS — Z23 Encounter for immunization: Secondary | ICD-10-CM

## 2020-03-03 NOTE — Progress Notes (Signed)
   Covid-19 Vaccination Clinic  Name:  Oscar Brown    MRN: [id number] DOB: 09-Jul-1966  03/03/2020  [name] was observed post Covid-19 immunization for 15 minutes without incident. He was provided with Vaccine Information Sheet and instruction to access the V-Safe system.   [name] was instructed to call 911 with any severe reactions post vaccine: Marland Kitchen Difficulty breathing  . Swelling of face and throat  . A fast heartbeat  . A bad rash all over body  . Dizziness and weakness   Immunizations Administered    Name Date Dose VIS Date Route   Pfizer COVID-19 Vaccine 03/03/2020  5:12 PM 0.3 mL 11/09/2019 Intramuscular   Manufacturer: [place]   Lot: [id number]   [place]: [id number]

## 2020-03-10 ENCOUNTER — Encounter: Payer: Self-pay | Admitting: Neurology

## 2020-03-11 ENCOUNTER — Encounter: Payer: Self-pay | Admitting: Neurology

## 2020-03-11 ENCOUNTER — Ambulatory Visit: Payer: Managed Care, Other (non HMO) | Admitting: Neurology

## 2020-03-11 ENCOUNTER — Other Ambulatory Visit: Payer: Self-pay

## 2020-03-11 VITALS — BP 138/91 | HR 79 | Ht 67.0 in | Wt 244.0 lb

## 2020-03-11 DIAGNOSIS — Z8674 Personal history of sudden cardiac arrest: Secondary | ICD-10-CM

## 2020-03-11 DIAGNOSIS — E669 Obesity, unspecified: Secondary | ICD-10-CM

## 2020-03-11 DIAGNOSIS — Z952 Presence of prosthetic heart valve: Secondary | ICD-10-CM | POA: Diagnosis not present

## 2020-03-11 DIAGNOSIS — I4892 Unspecified atrial flutter: Secondary | ICD-10-CM

## 2020-03-11 DIAGNOSIS — G4733 Obstructive sleep apnea (adult) (pediatric): Secondary | ICD-10-CM | POA: Diagnosis not present

## 2020-03-11 DIAGNOSIS — I340 Nonrheumatic mitral (valve) insufficiency: Secondary | ICD-10-CM

## 2020-03-11 DIAGNOSIS — Z9989 Dependence on other enabling machines and devices: Secondary | ICD-10-CM

## 2020-03-11 NOTE — Progress Notes (Signed)
Subjective:    Patient ID: Oscar Brown is a 54 y.o. male.  HPI     Star Age, MD, PhD [facility] [address], [address] [place], [place] [address]  Dear Dr. Brigitte Pulse,   I saw your patient, Oscar Brown, upon your kind request in my sleep clinic today for initial consultation of his sleep disorder, in particular, evaluation of his prior diagnosis of obstructive sleep apnea.  The patient is unaccompanied today.  As you know, Oscar Brown is a 54 year old right-handed gentleman with an underlying medical history of mitral regurgitation with status post mitral valve replacement in 2016, on Coumadin, hx of Vfib arrest, Hx of recurrent Aflutter, hyperlipidemia, history of DVT, COPD, history of atrial fibrillation, history of lymphedema, right lower extremity cellulitis, and obesity, who was previously diagnosed with obstructive sleep apnea and placed on CPAP therapy.  Prior sleep study results are not available for my review today.  I reviewed your office records, phone note from 02/25/2020 as well as telemedicine note from 06/21/2019.  His Epworth sleepiness score is 0/24. A CPAP compliance download was reviewed today, from 02/10/2020 through 03/10/2020, which is a total of 30 days, during which time he used his machine 25 days with percent used days greater than 4 hours at 20% only, indicating significantly suboptimal compliance with an average usage of 2 hours and 51 minutes, residual AHI 0.7/h, leak very high with a 95th percentile at 54.2 L/min on a pressure of 11 cm.  His DME company is [employer].  He has not received supplies in quite some time, uses a wide Mirage Fx nasal mask.  He reports that he is trying to keep his CPAP on all night but it is uncomfortable as the headgear is loose and he has trouble keeping the mask on.  He has benefited from treatment and would like to get back on the machine with new supplies and an updated machine as soon as possible.  He  has a variable sleep schedule given that he has 2 jobs.  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  Generally, he may be in bed between 1230 and 1 AM, rise time is generally around 7 AM.  He does have to go to the bathroom at least once per average night.  He lives with his 64 year old son who is in college.  He has 2 older daughters.  He is separated from his second wife.  Prior sleep study testing was over 10 years ago at [facility] he recalls.  He has an S9 machine from [employer]	[place].  His weight fluctuates a little bit, less in the summertime, a little more in the wintertime.  He does not drink any caffeine and tries to hydrate well with water.  He quit smoking some 5, nearly 6 years ago and drinks alcohol very occasionally.  He has a family history of sleep apnea, one of his daughters has a CPAP machine, 2 sisters have sleep apnea and his younger brother is in the process of being evaluated for sleep apnea as I understand.   His Past Medical History Is Significant For: Past Medical History:  Diagnosis Date  . Acute diastolic congestive heart failure ([place]) 09/21/2015   takes Lasix daily  . Acute renal failure ([facility])    ATN s/p cardiac arrest  . Atrial fibrillation ([place])   . 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  Complicated by  chronic left lower extremity edema and occasional recurrent left lower extremity cellulitis   . COPD (chronic obstructive pulmonary disease) ([place])   . DVT (deep venous thrombosis) ([place]) early 80's   left leg  . HLD (hyperlipidemia)   . Incidental lung nodule, less than or equal to 15mm 11/10/2015   3 mm nodule right lung  . Lymphedema early 58's  . Mitral regurgitation 10/06/2012  . Obstructive sleep apnea 12/14/2006   AHI 19.2/hr, desaturation to 75% on Polysomnography 02/26/2005.  Uses 11 cm H2O nocturnal nasal CPAP with good symptom control.   . OSA on CPAP   . S/P minimally invasive mitral valve  replacement with metallic valve [id number]   33 mm Carbomedics Optiform bileaflet mechanical prosthesis placed via right mini-thoracotomy approach    His Past Surgical History Is Significant For: Past Surgical History:  Procedure Laterality Date  . A-FLUTTER ABLATION N/A 03/20/2018   Procedure: A-FLUTTER ABLATION;  Surgeon: Evans Lance, MD;  Location: [place] [facility];  Service: Cardiovascular;  Laterality: N/A;  . CARDIAC CATHETERIZATION N/A 11/03/2015   Procedure: Right/Left Heart Cath and Coronary Angiography;  Surgeon: Leonie Man, MD;  Location: [place] [facility];  Service: Cardiovascular;  Laterality: N/A;  . CARDIOVERSION N/A 12/23/2017   Procedure: CARDIOVERSION;  Surgeon: Thayer Headings, MD;  Location: [facility] ENDOSCOPY;  Service: Cardiovascular;  Laterality: N/A;  . MITRAL VALVE REPLACEMENT Right 11/13/2015   Procedure: MINIMALLY INVASIVE MITRAL VALVE (MV) REPLACEMENT;  Surgeon: [name], MD;  Location: [place];  Service: Open Heart Surgery;  Laterality: Right;  . MULTIPLE EXTRACTIONS WITH ALVEOLOPLASTY N/A 11/07/2015   Procedure: Extraction of tooth #'s 1,2,8,9,12,15,16,17, 32 with alveoloplasty and gross debridement of remaining teeth.;  Surgeon: [name], DDS;  Location: [place];  Service: Oral Surgery;  Laterality: N/A;  . TEE WITHOUT CARDIOVERSION N/A 10/16/2015   Procedure: TRANSESOPHAGEAL ECHOCARDIOGRAM (TEE);  Surgeon: Skeet Latch, MD;  Location: [place];  Service: Cardiovascular;  Laterality: N/A;  . TEE WITHOUT CARDIOVERSION N/A 11/13/2015   Procedure: TRANSESOPHAGEAL ECHOCARDIOGRAM (TEE);  Surgeon: [name], MD;  Location: [place];  Service: Open Heart Surgery;  Laterality: N/A;  . TEE WITHOUT CARDIOVERSION N/A 11/03/2018   Procedure: TRANSESOPHAGEAL ECHOCARDIOGRAM (TEE);  Surgeon: Dorothy Spark, MD;  Location: [facility] ENDOSCOPY;  Service: Cardiovascular;  Laterality: N/A;  . TEE WITHOUT CARDIOVERSION N/A 12/13/2018   Procedure:  TRANSESOPHAGEAL ECHOCARDIOGRAM (TEE);  Surgeon: Jolaine Artist, MD;  Location: [facility] ENDOSCOPY;  Service: Cardiovascular;  Laterality: N/A;    His Family History Is Significant For: Family History  Problem Relation Age of Onset  . Cancer Mother        type unknown but had a colostomy bag  . Other Father        bone disease  . Diabetes Paternal Aunt     His Social History Is Significant For: Social History   Socioeconomic History  . Marital status: Legally Separated    Spouse name: Not on file  . Number of children: 3  . Years of education: Not on file  . Highest education level: Not on file  Occupational History  . Not on file  Tobacco Use  . Smoking status: Former Smoker    Packs/day: 0.10    Years: 9.00    Pack years: 0.90    Types: Cigarettes    Quit date: 09/05/2012    Years since quitting: 7.5  . Smokeless tobacco: Never Used  Substance and Sexual Activity  . Alcohol use:  Yes    Alcohol/week: 0.0 standard drinks    Comment: 1 beer once a week or special occasions  . Drug use: No  . Sexual activity: Yes  Other Topics Concern  . Not on file  Social History Narrative  . Not on file   Social Determinants of Health   Financial Resource Strain:   . Difficulty of Paying Living Expenses:   Food Insecurity:   . Worried About Charity fundraiser in the Last Year:   . Arboriculturist in the Last Year:   Transportation Needs:   . Film/video editor (Medical):   Marland Kitchen Lack of Transportation (Non-Medical):   Physical Activity:   . Days of Exercise per Week:   . Minutes of Exercise per Session:   Stress:   . Feeling of Stress :   Social Connections:   . Frequency of Communication with Friends and Family:   . Frequency of Social Gatherings with Friends and Family:   . Attends Religious Services:   . Active Member of Clubs or Organizations:   . Attends Archivist Meetings:   Marland Kitchen Marital Status:     His Allergies Are:  Allergies  Allergen Reactions   . Iohexol Hives, Itching and Other (See Comments)    Patient needs 13 hour prep per Dr. Pascal Lux, after cath he broke out in hives and had itching   :   His Current Medications Are:  Outpatient Encounter Medications as of 03/11/2020  Medication Sig  . aspirin (ASPIR-81) 81 MG EC tablet Take 1 tablet (81 mg total) by mouth daily. Swallow whole.  . furosemide (LASIX) 40 MG tablet TAKE 1 TABLET BY MOUTH TWICE WEEKLY ON MONDAY AND FRIDAY  . rosuvastatin (CRESTOR) 20 MG tablet Take 20 mg by mouth daily.  Marland Kitchen warfarin (COUMADIN) 5 MG tablet TAKE ONE AND A HALF TO TWO TABLETS BY MOUTH DAILY AS DIRECTED BY COUMADIN CLINIC   No facility-administered encounter medications on file as of 03/11/2020.  :  Review of Systems:  Out of a complete 14 point review of systems, all are reviewed and negative with the exception of these symptoms as listed below:  Review of Systems  Neurological:       4/13 Patient here as new patient for sleep consult.  He has been on CPAP since early 80's, needs new sleep study to get new supplies.   Epworth Sleepiness Scale 0= would never doze 1= slight chance of dozing 2= moderate chance of dozing 3= high chance of dozing  Sitting and reading:0 Watching TV:0 Sitting inactive in a public place (ex. Theater or meeting):0 As a passenger in a car for an hour without a break:0 Lying down to rest in the afternoon:0 Sitting and talking to someone:0 Sitting quietly after lunch (no alcohol):0 In a car, while stopped in traffic:0 Total: 0      Objective:  Neurological Exam  Physical Exam Physical Examination:   Vitals:   03/11/20 0808  BP: (!) 138/91  Pulse: 79    General Examination: The patient is a very pleasant 54 y.o. male in no acute distress. He appears well-developed and well-nourished and well groomed.   HEENT: Normocephalic, atraumatic, pupils are equal, round and reactive to light, extraocular tracking is good without limitation to gaze excursion or  nystagmus noted. Hearing is grossly intact. Face is symmetric with normal facial animation. Speech is clear with no dysarthria noted. There is no hypophonia. There is no lip, neck/head, jaw or voice tremor.  Neck is supple with full range of passive and active motion. There are no carotid bruits on auscultation. Oropharynx exam reveals: mild mouth dryness, adequate dental hygiene and moderate airway crowding, due to wider tongue, wider uvula, Mallampati class II, tonsils in place, about 2+ bilaterally.  Tongue protrudes centrally in palate elevates symmetrically.  Neck circumference is 17-3/8 inches.  Chest: Clear to auscultation without wheezing, rhonchi or crackles noted.  Heart: S1+S2+0, regular and normal with valve click noted.   Abdomen: Soft, non-tender and non-distended with normal bowel sounds appreciated on auscultation.  Extremities: There is nonpitting edema in the left leg, compression stockings in place.   Skin: Warm and dry without trophic changes noted.   Musculoskeletal: exam reveals no obvious joint deformities, tenderness or joint swelling or erythema.   Neurologically:  Mental status: The patient is awake, alert and oriented in all 4 spheres. His immediate and remote memory, attention, language skills and fund of knowledge are appropriate. There is no evidence of aphasia, agnosia, apraxia or anomia. Speech is clear with normal prosody and enunciation. Thought process is linear. Mood is normal and affect is normal.  Cranial nerves II - XII are as described above under HEENT exam.  Motor exam: Normal bulk, strength and tone is noted. There is no tremor, Romberg is negative. Fine motor skills and coordination: grossly intact.  Cerebellar testing: No dysmetria or intention tremor. There is no truncal or gait ataxia.  Sensory exam: intact to light touch in the upper and lower extremities.  Gait, station and balance: He stands easily. No veering to one side is noted. No leaning to  one side is noted. Posture is age-appropriate and stance is narrow based. Gait shows normal stride length and normal pace. No problems turning are noted. Tandem walk is unremarkable.                Assessment and Plan:  In summary, Oscar Brown is a very pleasant 54 y.o.-year old male with an underlying medical history of mitral regurgitation with status post mitral valve replacement in 2016, on Coumadin, hx of Vfib arrest, Hx of recurrent Aflutter, hyperlipidemia, history of DVT, COPD, history of atrial fibrillation, history of lymphedema, right lower extremity cellulitis, and obesity, who presents for evaluation of his prior diagnosis of obstructive sleep apnea.  He has been on CPAP therapy for years but needs new supplies and an updated machine.  He has been on CPAP of 11 cm and pressure setting seems to be adequate.  His mask is leaking, he has had good compliance for usage but suboptimal compliance for length of usage.  He is motivated to continue with CPAP and increase his usage, current machine and supplies are outdated and make it difficult for him to keep the mask on at night.  I suggested we proceed with sleep study testing to update his diagnosis.  We may be able to proceed with an AutoPap machine after his testing versus bringing him back for a formal titration study.   We talked about the risks and ramifications of untreated moderate to severe OSA, especially with respect to developing cardiovascular disease down the Road, including congestive heart failure, difficult to treat hypertension, cardiac arrhythmias, or stroke. Even type 2 diabetes has, in part, been linked to untreated OSA. Symptoms of untreated OSA include daytime sleepiness, memory problems, mood irritability and mood disorder such as depression and anxiety, lack of energy, as well as recurrent headaches, especially morning headaches. We talked about trying to maintain a  healthy lifestyle in general, as well as the importance of  weight control. We also talked about the importance of good sleep hygiene. I recommended the following at this time: sleep study.  I explained the sleep test procedure to the patient and also outlined possible surgical and non-surgical treatment options of OSA. He is primarily interested in maintaining treatment with positive airway pressure. I explained the importance of being compliant with PAP treatment, not only for insurance purposes but primarily to improve His symptoms, and for the patient's long term health benefit, including to reduce His cardiovascular risks. I answered all his questions today and the patient was in agreement. I plan to see him back after the sleep study is completed and encouraged him to call with any interim questions, concerns, problems or updates.   Thank you very much for allowing me to participate in the care of this nice patient. If I can be of any further assistance to you please do not hesitate to call me at [phone].  Sincerely,   Star Age, MD, PhD

## 2020-03-11 NOTE — Patient Instructions (Addendum)
Thank you for choosing [facility] for your sleep related care! It was nice to meet you today! I appreciate that you entrust me with your sleep related healthcare concerns. I hope, I was able to address at least some of your concerns today, and that I can help you feel reassured and also get better.    Here is what we discussed today and what we came up with as our plan for you:    Based on your symptoms and your exam I believe you are still at risk for obstructive sleep apnea and would benefit from reevaluation as it has been many years and you need new supplies and an updated machine. Therefore, I think we should proceed with a sleep study to determine how severe your sleep apnea is. If you have more than mild OSA, I want you to consider ongoing treatment with CPAP. Please remember, the risks and ramifications of moderate to severe obstructive sleep apnea or OSA are: Cardiovascular disease, including congestive heart failure, stroke, difficult to control hypertension, arrhythmias, and even type 2 diabetes has been linked to untreated OSA. Sleep apnea causes disruption of sleep and sleep deprivation in most cases, which, in turn, can cause recurrent headaches, problems with memory, mood, concentration, focus, and vigilance. Most people with untreated sleep apnea report excessive daytime sleepiness, which can affect their ability to drive. Please do not drive if you feel sleepy.   I will likely see you back after your sleep study to go over the test results and where to go from there. We will call you after your sleep study to advise about the results (most likely, you will hear from Kristen, my nurse) and to set up an appointment at the time, as necessary.    Our sleep lab administrative assistant will call you to schedule your sleep study. If you don't hear back from her by about 2 weeks from now, please feel free to call her at [phone]. You can leave a message with your phone  number and concerns, if you get the voicemail box. She will call back as soon as possible.     

## 2020-03-14 ENCOUNTER — Ambulatory Visit (INDEPENDENT_AMBULATORY_CARE_PROVIDER_SITE_OTHER): Payer: Managed Care, Other (non HMO) | Admitting: Pharmacist Clinician (PhC)/ Clinical Pharmacy Specialist

## 2020-03-14 ENCOUNTER — Other Ambulatory Visit: Payer: Self-pay

## 2020-03-14 DIAGNOSIS — Z954 Presence of other heart-valve replacement: Secondary | ICD-10-CM

## 2020-03-14 DIAGNOSIS — I4892 Unspecified atrial flutter: Secondary | ICD-10-CM

## 2020-03-14 DIAGNOSIS — Z7901 Long term (current) use of anticoagulants: Secondary | ICD-10-CM

## 2020-03-14 LAB — POCT INR: INR: 5.9 — AB (ref 2.0–3.0)

## 2020-03-25 ENCOUNTER — Telehealth: Payer: Self-pay | Admitting: Neurology

## 2020-03-25 NOTE — Telephone Encounter (Signed)
Pt called wanting to know about his sleep study results. Please advise.

## 2020-03-28 ENCOUNTER — Other Ambulatory Visit: Payer: Self-pay

## 2020-03-28 ENCOUNTER — Ambulatory Visit (INDEPENDENT_AMBULATORY_CARE_PROVIDER_SITE_OTHER): Payer: Managed Care, Other (non HMO) | Admitting: Pharmacist

## 2020-03-28 DIAGNOSIS — Z954 Presence of other heart-valve replacement: Secondary | ICD-10-CM

## 2020-03-28 DIAGNOSIS — I4892 Unspecified atrial flutter: Secondary | ICD-10-CM

## 2020-03-28 DIAGNOSIS — Z7901 Long term (current) use of anticoagulants: Secondary | ICD-10-CM | POA: Diagnosis not present

## 2020-03-28 LAB — POCT INR: INR: 3.3 — AB (ref 2.0–3.0)

## 2020-04-14 ENCOUNTER — Ambulatory Visit (INDEPENDENT_AMBULATORY_CARE_PROVIDER_SITE_OTHER): Payer: Managed Care, Other (non HMO) | Admitting: Neurology

## 2020-04-14 DIAGNOSIS — Z8674 Personal history of sudden cardiac arrest: Secondary | ICD-10-CM

## 2020-04-14 DIAGNOSIS — Z952 Presence of prosthetic heart valve: Secondary | ICD-10-CM

## 2020-04-14 DIAGNOSIS — E669 Obesity, unspecified: Secondary | ICD-10-CM

## 2020-04-14 DIAGNOSIS — G4733 Obstructive sleep apnea (adult) (pediatric): Secondary | ICD-10-CM | POA: Diagnosis not present

## 2020-04-14 DIAGNOSIS — I340 Nonrheumatic mitral (valve) insufficiency: Secondary | ICD-10-CM

## 2020-04-14 DIAGNOSIS — I4892 Unspecified atrial flutter: Secondary | ICD-10-CM

## 2020-04-22 NOTE — Progress Notes (Signed)
Patient referred by Dr. Brigitte Pulse for re-eval of his OSA, seen by me on 03/11/20, HST on 04/14/20.    Please call and notify the patient that the recent home sleep test showed obstructive sleep apnea in the moderate range. He has an older CPAP. Based on the HST, I would like for him to start autoPAP with a new machine. He may have an existing DME. The DME representative will educate him on how to use the machine, how to put the mask on, etc. I have placed an order in the chart. Please send referral, talk to patient, send report to referring MD. We will need a FU in sleep clinic for 10 weeks post-PAP set up, please arrange that with me or one of our NPs. Thanks,   Star Age, MD, PhD [facility])

## 2020-04-22 NOTE — Procedures (Signed)
Patient Information     First Name: Oscar Last Name: Brown ID: [id number]  Birth Date: 09-Oct-1966 Age: 54 Gender: Male  Referring Provider: Marton Redwood, MD BMI: 38.4 (W=244 lb, H=5' 7'')  Neck Circ.:  85 ''    Sleep Study Information    Study Date: Apr 14, 2020 S/H/A Version: 001.001.001.001 / 4.1.1528 / 51  History:    54 year old man with a history of mitral regurgitation with status post mitral valve replacement in 2016, on Coumadin, hx of Vfib arrest, Hx of recurrent Aflutter, hyperlipidemia, history of DVT, COPD, history of atrial fibrillation, history of lymphedema, right lower extremity cellulitis, and obesity, who was previously diagnosed with obstructive sleep apnea and placed on CPAP therapy. He presents for re-evaluation.  Summary & Diagnosis:     OSA Recommendations:     This home sleep test demonstrates moderate obstructive sleep apnea with a total AHI of 26.6/hour and O2 nadir of 84%. Treatment with positive airway pressure is recommended. The patient has been on CPAP therapy at home. Based on this study, he will be advised to proceed with an autoPAP titration/trial at home for now. Please note that untreated obstructive sleep apnea may carry additional perioperative morbidity. Patients with significant obstructive sleep apnea should receive perioperative PAP therapy and the surgeons and particularly the anesthesiologist should be informed of the diagnosis and the severity of the sleep disordered breathing. The patient should be cautioned not to drive, work at heights, or operate dangerous or heavy equipment when tired or sleepy. Review and reiteration of good sleep hygiene measures should be pursued with any patient. Other causes of the patient's symptoms, including circadian rhythm disturbances, an underlying mood disorder, medication effect and/or an underlying medical problem cannot be ruled out based on this test. Clinical correlation is recommended. The patient and his  referring provider will be notified of the test results. The patient will be seen in follow up in sleep clinic at [facility].  I certify that I have reviewed the raw data recording prior to the issuance of this report in accordance with the standards of the American Academy of Sleep Medicine (AASM).  Star Age, MD, PhD [facility]) Diplomat, ABPN (Neurology and Sleep)             Sleep Summary  Oxygen Saturation Statistics   Start Study Time: End Study Time: Total Recording Time:          10:15:41 PM 7:27:03 AM   9 h, 11 min  Total Sleep Time % REM of Sleep Time:  7 h, 27 min  23.2    Mean: 96 Minimum: 84 Maximum: 100  Mean of Desaturations Nadirs (%):   93  Oxygen Desaturation. %:   4-9 10-20 >20 Total  Events Number Total    86  6 93.5 6.5  0 0.0  92 100.0  Oxygen Saturation: <90 <=88 <85 <80 <70  Duration (minutes): Sleep % 0.7 0.2  0.4 0.0  0.1 0.0 0.0 0.0 0.0 0.0     Respiratory Indices      Total Events REM NREM All Night  pRDI:  188  pAHI:  183 ODI:  92  pAHIc:  9  % CSR: 0.0 28.5 28.5 15.5 0.7 27.0 26.0 12.7 1.5 27.3 26.6 13.4 1.3       Pulse Rate Statistics during Sleep (BPM)      Mean:  60 Minimum: N/A Maximum: 119    Indices are calculated using technically valid sleep  time of 6 h, 52 min. pRDI/pAHI are calculated using oxi desaturations ? 3%  Body Position Statistics  Position Supine Prone Right Left Non-Supine  Sleep (min) 282.9 0.0 122.0 43.0 165.0  Sleep % 63.2 0.0 27.2 9.6 36.8  pRDI 29.2 N/A 20.1 34.3 23.7  pAHI 28.1 N/A 20.1 34.3 23.7  ODI 14.9 N/A 6.3 22.3 10.3     Snoring Statistics Snoring Level (dB) >40 >50 >60 >70 >80 >Threshold (45)  Sleep (min) 447.8 444.8 276.5 0.0 0.0 447.8  Sleep % 100.0 99.3 61.7 0.0 0.0 100.0    Mean: 62 dB Sleep Stages Chart                                         pAHI=26.6                                                     Mild              Moderate                     Severe                                                 5              15                    30

## 2020-04-22 NOTE — Addendum Note (Signed)
Addended by: Star Age on: 04/22/2020 07:35 AM   Modules accepted: Orders

## 2020-04-24 ENCOUNTER — Telehealth: Payer: Self-pay

## 2020-04-24 NOTE — Telephone Encounter (Signed)
I called pt. I advised pt that Dr. [name] reviewed their sleep study results and found that pt has moderate osa. Dr. [name]  recommends that pt start an autopap at home for treatment. I reviewed PAP compliance expectations with the pt. Pt is agreeable to starting an auto-PAP. I advised pt that an order will be sent to a DME, [employer], and [employer] will call the pt within about one week after they file with the pt's insurance. [employer] will show the pt how to use the machine, fit for masks, and troubleshoot the auto-PAP if needed. A follow up appt was made for insurance purposes with Dr. [name] on 06/25/2020. Pt verbalized understanding to arrive 15 minutes early and bring their auto-PAP. A letter with all of this information in it will be mailed to the pt as a reminder. I verified with the pt that the address we have on file is correct. Pt verbalized understanding of results. Pt had no questions at this time but was encouraged to call back if questions arise. I have sent the order to [employer] and have received confirmation that they have received the order. Result sent to referring MD.

## 2020-04-24 NOTE — Telephone Encounter (Signed)
-----   Message from Star Age, MD sent at 04/22/2020  7:35 AM EDT ----- Patient referred by Dr. Brigitte Pulse for re-eval of his OSA, seen by me on 03/11/20, HST on 04/14/20.    Please call and notify the patient that the recent home sleep test showed obstructive sleep apnea in the moderate range. He has an older CPAP. Based on the HST, I would like for him to start autoPAP with a new machine. He may have an existing DME. The DME representative will educate him on how to use the machine, how to put the mask on, etc. I have placed an order in the chart. Please send referral, talk to patient, send report to referring MD. We will need a FU in sleep clinic for 10 weeks post-PAP set up, please arrange that with me or one of our NPs. Thanks,   Star Age, MD, PhD [facility])

## 2020-05-14 ENCOUNTER — Ambulatory Visit (INDEPENDENT_AMBULATORY_CARE_PROVIDER_SITE_OTHER): Payer: Managed Care, Other (non HMO) | Admitting: Pharmacist

## 2020-05-14 ENCOUNTER — Other Ambulatory Visit: Payer: Self-pay

## 2020-05-14 DIAGNOSIS — Z952 Presence of prosthetic heart valve: Secondary | ICD-10-CM

## 2020-05-14 DIAGNOSIS — I4892 Unspecified atrial flutter: Secondary | ICD-10-CM

## 2020-05-14 DIAGNOSIS — I483 Typical atrial flutter: Secondary | ICD-10-CM

## 2020-05-14 DIAGNOSIS — Z954 Presence of other heart-valve replacement: Secondary | ICD-10-CM

## 2020-05-14 DIAGNOSIS — Z7901 Long term (current) use of anticoagulants: Secondary | ICD-10-CM | POA: Diagnosis not present

## 2020-05-14 LAB — POCT INR: INR: 3.1 — AB (ref 2.0–3.0)

## 2020-05-14 NOTE — Patient Instructions (Signed)
Description   Continue with 7.5mg  daily except 5mg  on Mondays. Repeat INR in 6 week.Marland Kitchen

## 2020-06-09 ENCOUNTER — Other Ambulatory Visit: Payer: Self-pay | Admitting: Internal Medicine

## 2020-06-13 ENCOUNTER — Other Ambulatory Visit (HOSPITAL_COMMUNITY): Payer: Self-pay | Admitting: Internal Medicine

## 2020-06-16 ENCOUNTER — Other Ambulatory Visit (HOSPITAL_COMMUNITY): Payer: Self-pay | Admitting: Internal Medicine

## 2020-06-23 ENCOUNTER — Ambulatory Visit (INDEPENDENT_AMBULATORY_CARE_PROVIDER_SITE_OTHER): Payer: Managed Care, Other (non HMO) | Admitting: Pharmacist Clinician (PhC)/ Clinical Pharmacy Specialist

## 2020-06-23 ENCOUNTER — Other Ambulatory Visit: Payer: Self-pay

## 2020-06-23 DIAGNOSIS — I4892 Unspecified atrial flutter: Secondary | ICD-10-CM

## 2020-06-23 DIAGNOSIS — Z7901 Long term (current) use of anticoagulants: Secondary | ICD-10-CM | POA: Diagnosis not present

## 2020-06-23 DIAGNOSIS — Z954 Presence of other heart-valve replacement: Secondary | ICD-10-CM | POA: Diagnosis not present

## 2020-06-23 LAB — POCT INR: INR: 3 (ref 2.0–3.0)

## 2020-06-25 ENCOUNTER — Ambulatory Visit: Payer: Self-pay | Admitting: Neurology

## 2020-07-27 ENCOUNTER — Encounter: Payer: Self-pay | Admitting: Neurology

## 2020-07-29 ENCOUNTER — Ambulatory Visit: Payer: Managed Care, Other (non HMO) | Admitting: Neurology

## 2020-07-29 ENCOUNTER — Encounter: Payer: Self-pay | Admitting: Neurology

## 2020-07-29 VITALS — BP 122/80 | HR 62 | Ht 66.5 in | Wt 236.0 lb

## 2020-07-29 DIAGNOSIS — Z9989 Dependence on other enabling machines and devices: Secondary | ICD-10-CM | POA: Diagnosis not present

## 2020-07-29 DIAGNOSIS — G4733 Obstructive sleep apnea (adult) (pediatric): Secondary | ICD-10-CM

## 2020-07-29 NOTE — Progress Notes (Signed)
Subjective:    Patient ID: Oscar Brown is a 54 y.o. male.  HPI     Interim history:   Mr. Neely is a 54 year old right-handed gentleman with an underlying medical history of mitral regurgitation with status post mitral valve replacement in 2016, on Coumadin, hx of Vfib arrest, Hx of recurrent Aflutter, hyperlipidemia, history of DVT, COPD, history of atrial fibrillation, history of lymphedema, right lower extremity cellulitis, and obesity, who Presents for follow-up consultation of his obstructive sleep apnea.  Recent home sleep testing and starting AutoPap therapy.  Patient is unaccompanied today.  I first met him at the request of his primary care physician, on 03/11/2020, at which time the patient reported a prior diagnosis of obstructive sleep apnea and he was on CPAP therapy. He was not fully compliant with treatment at that time and needed new equipment and new supplies. He was eligible for new equipment.  He was advised to proceed with sleep testing, he had a home sleep test on 04/14/2020 which indicated moderate obstructive sleep apnea with an AHI of 26.6/h, O2 nadir of 84%.  He was advised to proceed with home AutoPap therapy.  His set up date with the new equipment was 05/27/2020.  Today, 07/29/2020: I reviewed his AutoPap compliance data from 06/28/2020 through 07/27/2020, at which time he used his AutoPap every night with percent use days greater than 4 hours at 100%, indicating superb compliance with an average usage of 5 hours and 42 minutes, residual AHI at goal at 0.7/h, average pressure for the 95th percentile at 11.8 cm with a range of 7 to 14 cm, leak acceptable with a 95th percentile at 10.7 L/min.  He reports doing well, he has adjusted well to AutoPap therapy and is fully compliant with it.  He feels improved, particularly with regards to his daytime energy and daytime somnolence.  He is quite pleased with how he is doing.  He is using a nasal mask, similar to his previous mask  style.  He is up-to-date with his supplies and very motivated to continue with treatment.  He has been sleeping an average of 6 hours, sometimes as long as 7 hours.   The patient's allergies, current medications, family history, past medical history, past social history, past surgical history and problem list were reviewed and updated as appropriate.   Previously:   03/11/20: (He) was previously diagnosed with obstructive sleep apnea and placed on CPAP therapy.  Prior sleep study results are not available for my review today.  I reviewed your office records, phone note from 02/25/2020 as well as telemedicine note from 06/21/2019.  His Epworth sleepiness score is 0/24. A CPAP compliance download was reviewed today, from 02/10/2020 through 03/10/2020, which is a total of 30 days, during which time he used his machine 25 days with percent used days greater than 4 hours at 20% only, indicating significantly suboptimal compliance with an average usage of 2 hours and 51 minutes, residual AHI 0.7/h, leak very high with a 95th percentile at 54.2 L/min on a pressure of 11 cm.  His DME company is [employer].  He has not received supplies in quite some time, uses a wide Mirage Fx nasal mask.  He reports that he is trying to keep his CPAP on all night but it is uncomfortable as the headgear is loose and he has trouble keeping the mask on.  He has benefited from treatment and would like to get back on the machine with new supplies and an  updated machine as soon as possible.  He has a variable sleep schedule given that he has 2 jobs.  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  Generally, he may be in bed between 1230 and 1 AM, rise time is generally around 7 AM.  He does have to go to the bathroom at least once per average night.  He lives with his 76 year old son who is in college.  He has 2 older daughters.  He is separated from his second wife.  Prior sleep study testing was over 10 years  ago at [facility] he recalls.  He has an S9 machine from [employer]	[place].  His weight fluctuates a little bit, less in the summertime, a little more in the wintertime.  He does not drink any caffeine and tries to hydrate well with water.  He quit smoking some 5, nearly 6 years ago and drinks alcohol very occasionally.  He has a family history of sleep apnea, one of his daughters has a CPAP machine, 2 sisters have sleep apnea and his younger brother is in the process of being evaluated for sleep apnea as I understand.  His Past Medical History Is Significant For: Past Medical History:  Diagnosis Date  . Acute diastolic congestive heart failure ([place]) 09/21/2015   takes Lasix daily  . Acute renal failure ([facility])    ATN s/p cardiac arrest  . Atrial fibrillation ([place])   . 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  Complicated by chronic left lower extremity edema and occasional recurrent left lower extremity cellulitis   . COPD (chronic obstructive pulmonary disease) ([place])   . DVT (deep venous thrombosis) ([place]) early 80's   left leg  . HLD (hyperlipidemia)   . Incidental lung nodule, less than or equal to 52m 11/10/2015   3 mm nodule right lung  . Lymphedema early 818's . Mitral regurgitation 10/06/2012  . Obstructive sleep apnea 12/14/2006   AHI 19.2/hr, desaturation to 75% on Polysomnography 02/26/2005.  Uses 11 cm H2O nocturnal nasal CPAP with good symptom control.   . OSA on CPAP   . S/P minimally invasive mitral valve replacement with metallic valve 101/75/1025  33 mm Carbomedics Optiform bileaflet mechanical prosthesis placed via right mini-thoracotomy approach    His Past Surgical History Is Significant For: Past Surgical History:  Procedure Laterality Date  . A-FLUTTER ABLATION N/A 03/20/2018   Procedure: A-FLUTTER ABLATION;  Surgeon: TEvans Lance MD;  [facility];  Service: Cardiovascular;  Laterality: N/A;  . CARDIAC CATHETERIZATION  N/A 11/03/2015   Procedure: Right/Left Heart Cath and Coronary Angiography;  Surgeon: DLeonie Man MD;  [facility];  Service: Cardiovascular;  Laterality: N/A;  . CARDIOVERSION N/A 12/23/2017   Procedure: CARDIOVERSION;  Surgeon: NThayer Headings MD;  [facility];  Service: Cardiovascular;  Laterality: N/A;  . MITRAL VALVE REPLACEMENT Right 11/13/2015   Procedure: MINIMALLY INVASIVE MITRAL VALVE (MV) REPLACEMENT;  Surgeon: CRexene Alberts MD;  [place] [facility]  Service: Open Heart Surgery;  Laterality: Right;  . MULTIPLE EXTRACTIONS WITH ALVEOLOPLASTY N/A 11/07/2015   Procedure: Extraction of tooth #'s 1,2,8,9,12,15,16,17, 32 with alveoloplasty and gross debridement of remaining teeth.;  Surgeon: RLenn Cal DDS;  [place] [facility]  Service: Oral Surgery;  Laterality: N/A;  . TEE WITHOUT CARDIOVERSION N/A 10/16/2015   Procedure: TRANSESOPHAGEAL ECHOCARDIOGRAM (TEE);  Surgeon: TSkeet Latch MD;  [place] [facility]  Service: Cardiovascular;  Laterality: N/A;  . TEE WITHOUT CARDIOVERSION N/A 11/13/2015   Procedure: TRANSESOPHAGEAL ECHOCARDIOGRAM (TEE);  Surgeon: [name], MD;  Location: [place];  Service: Open Heart Surgery;  Laterality: N/A;  . TEE WITHOUT CARDIOVERSION N/A 11/03/2018   Procedure: TRANSESOPHAGEAL ECHOCARDIOGRAM (TEE);  Surgeon: Dorothy Spark, MD;  [facility]COPY;  Service: Cardiovascular;  Laterality: N/A;  . TEE WITHOUT CARDIOVERSION N/A 12/13/2018   Procedure: TRANSESOPHAGEAL ECHOCARDIOGRAM (TEE);  Surgeon: Jolaine Artist, MD;  [facility];  Service: Cardiovascular;  Laterality: N/A;    His Family History Is Significant For: Family History  Problem Relation Age of Onset  . Cancer Mother        type unknown but had a colostomy bag  . Other Father        bone disease  . Diabetes Paternal Aunt     His Social History Is Significant For: Social History   Socioeconomic History  . Marital status:  Legally Separated    Spouse name: Not on file  . Number of children: 3  . Years of education: Not on file  . Highest education level: Not on file  Occupational History  . Not on file  Tobacco Use  . Smoking status: Former Smoker    Packs/day: 0.10    Years: 9.00    Pack years: 0.90    Types: Cigarettes    Quit date: 09/05/2012    Years since quitting: 7.9  . Smokeless tobacco: Never Used  Vaping Use  . Vaping Use: Never used  Substance and Sexual Activity  . Alcohol use: Yes    Alcohol/week: 0.0 standard drinks    Comment: 1 beer once a week or special occasions  . Drug use: No  . Sexual activity: Yes  Other Topics Concern  . Not on file  Social History Narrative  . Not on file   Social Determinants of Health   Financial Resource Strain:   . Difficulty of Paying Living Expenses: Not on file  Food Insecurity:   . Worried About Charity fundraiser in the Last Year: Not on file  . Ran Out of Food in the Last Year: Not on file  Transportation Needs:   . Lack of Transportation (Medical): Not on file  . Lack of Transportation (Non-Medical): Not on file  Physical Activity:   . Days of Exercise per Week: Not on file  . Minutes of Exercise per Session: Not on file  Stress:   . Feeling of Stress : Not on file  Social Connections:   . Frequency of Communication with Friends and Family: Not on file  . Frequency of Social Gatherings with Friends and Family: Not on file  . Attends Religious Services: Not on file  . Active Member of Clubs or Organizations: Not on file  . Attends Archivist Meetings: Not on file  . Marital Status: Not on file    His Allergies Are:  Allergies  Allergen Reactions  . Iohexol Hives, Itching and Other (See Comments)    Patient needs 13 hour prep per Dr. Pascal Lux, after cath he broke out in hives and had itching   :   His Current Medications Are:  Outpatient Encounter Medications as of 07/29/2020  Medication Sig  . aspirin (ASPIR-81) 81  MG EC tablet Take 1 tablet (81 mg total) by mouth daily. Swallow whole.  . furosemide (LASIX) 40 MG tablet TAKE 1 TABLET BY MOUTH TWICE WEEKLY ON MONDAYS AND FRIDAYS  .  rosuvastatin (CRESTOR) 20 MG tablet Take 20 mg by mouth daily.  Marland Kitchen warfarin (COUMADIN) 5 MG tablet TAKE 1 AND 1/2 TABLETS  TO TWO TABLETS BY MOUTH DAILY AS DIRECTED BY COUMADIN CLINIC   No facility-administered encounter medications on file as of 07/29/2020.  :  Review of Systems:  Out of a complete 14 point review of systems, all are reviewed and negative with the exception of these symptoms as listed below: Review of Systems  Neurological:       Here for f/u on cpap. Pt report machine is working well for him. No issues.     Objective:  Neurological Exam  Physical Exam Physical Examination:   Vitals:   07/29/20 0952  BP: 122/80  Pulse: 62  SpO2: 97%   General Examination: The patient is a very pleasant 54 y.o. male in no acute distress. He appears well-developed and well-nourished and well groomed.   HEENT: Normocephalic, atraumatic, pupils are equal, round and reactive to light, extraocular tracking is good without limitation to gaze excursion or nystagmus noted. Hearing is grossly intact. Face is symmetric with normal facial animation. Speech is clear with no dysarthria noted. There is no hypophonia. There is no lip, neck/head, jaw or voice tremor. Neck is supple with FROM. There are no carotid bruits on auscultation. Oropharynx exam reveals: no significant mouth dryness, adequate dental hygiene and moderate airway crowding.  Tongue protrudes centrally in palate elevates symmetrically.    Chest: Clear to auscultation without wheezing, rhonchi or crackles noted.  Heart: S1+S2+0, regular and normal with valve click noted, stable.  Abdomen: Soft, non-tender and non-distended with normal bowel sounds appreciated on auscultation.  Extremities: There is nonpitting edema in the left leg, compression stockings in  place, stable.   Skin: Warm and dry without trophic changes noted.   Musculoskeletal: exam reveals no obvious joint deformities, tenderness or joint swelling or erythema.   Neurologically:  Mental status: The patient is awake, alert and oriented in all 4 spheres. His immediate and remote memory, attention, language skills and fund of knowledge are appropriate. There is no evidence of aphasia, agnosia, apraxia or anomia. Speech is clear with normal prosody and enunciation. Thought process is linear. Mood is normal and affect is normal.  Cranial nerves II - XII are as described above under HEENT exam.  Motor exam: Normal bulk, strength and tone is noted. There is no tremor, fine motor skills and coordination: grossly intact.  Cerebellar testing: No dysmetria or intention tremor. There is no truncal or gait ataxia.  Sensory exam: intact to light touch in the upper and lower extremities.  Gait, station and balance: He stands easily. No veering to one side is noted. No leaning to one side is noted. Posture is age-appropriate and stance is narrow based. Gait shows normal stride length and normal pace. No problems turning are noted.                 Assessment and Plan:  In summary, RAISTLIN GUM is a very pleasant 54 year old male with an underlying medical history of mitral regurgitation with status post mitral valve replacement in 2016, on Coumadin, hx of Vfib arrest, Hx of recurrent Aflutter, hyperlipidemia, history of DVT, COPD, history of atrial fibrillation, history of lymphedema, right lower extremity cellulitis, and obesity, who presents for follow-up consultation of his obstructive sleep apnea which was noted to be in the moderate range by home sleep testing on 04/14/2020.  He was originally diagnosed with sleep apnea several years  ago and had been on CPAP of 11 cm, now established on AutoPap therapy with new equipment and new supplies.  He is fully compliant with treatment and reports benefit  from AutoPap therapy.  He is commended for his excellent treatment adherence.  He is well adapted to treatment and can follow-up routinely in 1 year.  We talked about his sleep study results and reviewed his compliance data in detail.  He is advised to follow-up in 1 year to see one of our nurse practitioners, we can likely also offer him a virtual visit if he prefers at the time.  I answered all his questions today and he was in agreement with the plan.  I spent 30 minutes in total face-to-face time and in reviewing records during pre-charting, more than 50% of which was spent in counseling and coordination of care, reviewing test results, reviewing medications and treatment regimen and/or in discussing or reviewing the diagnosis of OSA, the prognosis and treatment options. Pertinent laboratory and imaging test results that were available during this visit with the patient were reviewed by me and considered in my medical decision making (see chart for details).

## 2020-07-29 NOTE — Patient Instructions (Addendum)
You are fully compliant with your new AutoPap machine.  I am glad to hear that you are also feeling better.  Please continue using your autoPAP regularly. While your insurance requires that you use PAP at least 4 hours each night on 70% of the nights, I recommend, that you not skip any nights and use it throughout the night if you can. Getting used to PAP and staying with the treatment long term does take time and patience and discipline. Untreated obstructive sleep apnea when it is moderate to severe can have an adverse impact on cardiovascular health and raise her risk for heart disease, arrhythmias, hypertension, congestive heart failure, stroke and diabetes. Untreated obstructive sleep apnea causes sleep disruption, nonrestorative sleep, and sleep deprivation. This can have an impact on your day to day functioning and cause daytime sleepiness and impairment of cognitive function, memory loss, mood disturbance, and problems focussing. Using PAP regularly can improve these symptoms.  Keep up the good work! We can see you in 1 year, you can see one of our nurse practitioners as you are stable.  If you prefer, we can offer you a virtual visit through [place].

## 2020-08-06 ENCOUNTER — Other Ambulatory Visit: Payer: Self-pay

## 2020-08-06 ENCOUNTER — Ambulatory Visit (INDEPENDENT_AMBULATORY_CARE_PROVIDER_SITE_OTHER): Payer: Managed Care, Other (non HMO)

## 2020-08-06 DIAGNOSIS — Z954 Presence of other heart-valve replacement: Secondary | ICD-10-CM | POA: Diagnosis not present

## 2020-08-06 DIAGNOSIS — I4892 Unspecified atrial flutter: Secondary | ICD-10-CM | POA: Diagnosis not present

## 2020-08-06 DIAGNOSIS — Z7901 Long term (current) use of anticoagulants: Secondary | ICD-10-CM | POA: Diagnosis not present

## 2020-08-06 LAB — POCT INR: INR: 4.6 — AB (ref 2.0–3.0)

## 2020-08-06 NOTE — Patient Instructions (Signed)
Hold today and then Continue with 7.5mg  daily except 5mg  on Mondays. Repeat INR in 6 week.Marland Kitchen

## 2020-09-04 ENCOUNTER — Other Ambulatory Visit: Payer: Self-pay | Admitting: Internal Medicine

## 2020-09-17 ENCOUNTER — Ambulatory Visit (INDEPENDENT_AMBULATORY_CARE_PROVIDER_SITE_OTHER): Payer: Managed Care, Other (non HMO) | Admitting: Pharmacist

## 2020-09-17 ENCOUNTER — Other Ambulatory Visit: Payer: Self-pay

## 2020-09-17 DIAGNOSIS — Z7901 Long term (current) use of anticoagulants: Secondary | ICD-10-CM

## 2020-09-17 DIAGNOSIS — I4892 Unspecified atrial flutter: Secondary | ICD-10-CM | POA: Diagnosis not present

## 2020-09-17 DIAGNOSIS — Z954 Presence of other heart-valve replacement: Secondary | ICD-10-CM | POA: Diagnosis not present

## 2020-09-17 LAB — POCT INR: INR: 2.4 (ref 2.0–3.0)

## 2020-10-01 ENCOUNTER — Other Ambulatory Visit: Payer: Self-pay | Admitting: Internal Medicine

## 2020-10-17 ENCOUNTER — Ambulatory Visit (INDEPENDENT_AMBULATORY_CARE_PROVIDER_SITE_OTHER): Payer: Managed Care, Other (non HMO)

## 2020-10-17 DIAGNOSIS — Z7901 Long term (current) use of anticoagulants: Secondary | ICD-10-CM

## 2020-10-17 DIAGNOSIS — Z954 Presence of other heart-valve replacement: Secondary | ICD-10-CM | POA: Diagnosis not present

## 2020-10-17 DIAGNOSIS — I4892 Unspecified atrial flutter: Secondary | ICD-10-CM | POA: Diagnosis not present

## 2020-10-17 LAB — POCT INR: INR: 2.2 (ref 2.0–3.0)

## 2020-10-17 NOTE — Patient Instructions (Signed)
Take 2 tablets today only and then Continue with 7.5mg  daily except 5mg  on Mondays. Repeat INR in 4 week.

## 2020-10-19 NOTE — Progress Notes (Addendum)
Patient ID: Oscar Brown, male   DOB: 1966-08-20, 54 y.o.   MRN: [id number]     Advanced Heart Failure Clinic Note     Cardiologist: Dr Oscar Brown  PCP: Oscar Brown  HPI: Oscar Brown is a 54 y.o. male  with OSA and mitral regurgitation. 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 On POD #3  he had a sudden VF arrest with nearly an hour of resuscitation time. Follow-up transthoracic and transesophageal echocardiograms performed after the arrest revealed normal function of the mechanical prosthetic valve with severe dysfunction involving the inferior wall of the left ventricle EF35-40%. Cardiac enzymes went quite high and he had some bradycardia and AV block, all suggestive of possible inferior wall myocardial infarction. However, left ventricular function completely normalized prior to hospital discharge. Plans for diagnostic cardiac catheterization were postponed because the patient developed acute non-oliguric renal failure with creatinine up to 7.Because the patient's rhythm and left ventricular systolic function recovered completely, it was felt that the patient did not need defibrillator implantation. The patient was ultimately discharged from the hospital on the 18th postoperative day.   Underwent successful RFA of AFL 03/20/18 with Dr. Lovena Brown   Admitted 12/19 with LLE cellulitis. Started on empiric ABX. BCx positive for Streptococcus agalactiae. With mechanical MV, taken for TEE which showed highly mobile echo-density attached to the anterior portion of the sewing ring of the mechanical mitral valve measuring 12x5 mm consistent with prosthesis vegetation. Pt thought not to need surgical intervention. PICC was placed to continue 6 week course of IV ABX per ID. Also noted to have chronic DVT. Coumadin continued, no other change.   Had  TEE 12/13/2018 vegetation resolved. Valve working well.   Today he returns for HF follow up.Overall feeling fine. Denies SOB/PND/Orthopnea. Appetite ok. No fever or chills. Weight at home 241 pounds. Taking all medications. Working full time at [employer].    TEE 12/13/2018 LEFT VENTRICLE: EF = 55-60%. No regional wall motion abnormalities. LEFT ATRIUM: Dilated LEFT ATRIAL APPENDAGE: No thrombus.  AORTIC VALVE:  Trileaflet. No AI/AS. No vegetation. MITRAL VALVE:    Mechanical MV. Functioning well. No MR. Mild MS mean gradient 7mmHG. Previously seen vegetation no longer present  TRICUSPID VALVE: Normal. Mild TR. No vegetation. PULMONIC VALVE: Grossly normal. No vegetation. INTERATRIAL SEPTUM: No PFO or ASD. DESCENDING AORTA: Mild plaque  TEE 11/03/18 There is a highly mobile echodensity attached to the anterior portion of the sewing ring of the mechanical mitral valve measuring 12 x 5 mm consistent with prosthesis vegetation.  Echo 02/20/18 EF ~50% inferior HK RV mild to moderately HK. Septal flattening.  Echo 2/17 EF 45-50% inferior HK. MV ok   Review of systems complete and found to be negative unless listed in HPI.   SH:  Social History   Socioeconomic History  . Marital status: Legally Separated    Spouse name: Not on file  . Number of children: 3  . Years of education: Not on file  . Highest education level: Not on file  Occupational History  . Not on file  Tobacco Use  . Smoking status: Former Smoker    Packs/day: 0.10    Years: 9.00    Pack years: 0.90    Types: Cigarettes    Quit date: 09/05/2012    Years since quitting: 8.1  . Smokeless tobacco: Never Used  Vaping Use  . Vaping Use:  Never used  Substance and Sexual Activity  . Alcohol use: Yes    Alcohol/week: 0.0 standard drinks    Comment: 1 beer once a week or special occasions  . Drug use: No  . Sexual activity: Yes  Other Topics Concern  . Not on file  Social History Narrative  . Not on file    Social Determinants of Health   Financial Resource Strain:   . Difficulty of Paying Living Expenses: Not on file  Food Insecurity:   . Worried About Charity fundraiser in the Last Year: Not on file  . Ran Out of Food in the Last Year: Not on file  Transportation Needs:   . Lack of Transportation (Medical): Not on file  . Lack of Transportation (Non-Medical): Not on file  Physical Activity:   . Days of Exercise per Week: Not on file  . Minutes of Exercise per Session: Not on file  Stress:   . Feeling of Stress : Not on file  Social Connections:   . Frequency of Communication with Friends and Family: Not on file  . Frequency of Social Gatherings with Friends and Family: Not on file  . Attends Religious Services: Not on file  . Active Member of Clubs or Organizations: Not on file  . Attends Archivist Meetings: Not on file  . Marital Status: Not on file  Intimate Partner Violence:   . Fear of Current or Ex-Partner: Not on file  . Emotionally Abused: Not on file  . Physically Abused: Not on file  . Sexually Abused: Not on file   FH:  - Denies history of sudden cardiac death.  Past Medical History:  Diagnosis Date  . Acute diastolic congestive heart failure ([place]) 09/21/2015   takes Lasix daily  . Acute renal failure ([facility])    ATN s/p cardiac arrest  . Atrial fibrillation ([place])   . 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  Complicated by chronic left lower extremity edema and occasional recurrent left lower extremity cellulitis   . COPD (chronic obstructive pulmonary disease) ([place])   . DVT (deep venous thrombosis) ([place]) early 80's   left leg  . HLD (hyperlipidemia)   . Incidental lung nodule, less than or equal to 73mm 11/10/2015   3 mm nodule right lung  . Lymphedema early 17's  . Mitral regurgitation 10/06/2012  . Obstructive sleep apnea 12/14/2006   AHI 19.2/hr, desaturation to 75% on Polysomnography 02/26/2005.  Uses 11 cm H2O  nocturnal nasal CPAP with good symptom control.   . OSA on CPAP   . 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 aspirin (ASPIR-81) 81 MG EC tablet Take 1 tablet (81 mg total) by mouth daily. Swallow whole. 30 tablet 12  . furosemide (LASIX) 40 MG tablet TAKE 1 TABLET BY MOUTH TWICE WEEKLY ON MONDAYS AND FRIDAYS 24 tablet 2  . rosuvastatin (CRESTOR) 20 MG tablet Take 20 mg by mouth daily.    Marland Kitchen warfarin (COUMADIN) 5 MG tablet TAKE ONE AND ONE-HALF TO TWO TABLETS DAILY AS DIRECTED BY COUMDIN CLINIC. NEED APPOINTMENT WITH CARDIOLOGIST 60 tablet 0   No current facility-administered medications for this encounter.   Vitals:   10/20/20 0948  BP: 138/80  Pulse: 64  SpO2: 100%  Weight: 111.2 kg (245 lb 3.2  oz)     Wt Readings from Last 3 Encounters:  10/20/20 111.2 kg (245 lb 3.2 oz)  07/29/20 107 kg (236 lb)  03/11/20 110.7 kg (244 lb)    PHYSICAL EXAM: General:  Well appearing. No resp difficulty HEENT: normal Neck: supple. no JVD. Carotids 2+ bilat; no bruits. No lymphadenopathy or thryomegaly appreciated. Cor: PMI nondisplaced. Regular rate & rhythm. No rubs, gallops or murmurs. Mechanical S1 Lungs: clear Abdomen: soft, nontender, nondistended. No hepatosplenomegaly. No bruits or masses. Good bowel sounds. Extremities: no cyanosis, clubbing, rash, support stockings on bilaterally.  Neuro: alert & orientedx3, cranial nerves grossly intact. moves all 4 extremities w/o difficulty. Affect pleasant    ASSESSMENT & PLAN:  1. Recurrent A flutter, atypical - s/p DCCV 12/23/17. - s/p Aflutter ablation 03/20/18  - On coumadin for MVR. - No bleeding issues.  Denies bleeding.  2. Mitral regurgitation s/p mechanical MVR in 12/16 - Normal EF and stable without vegetation on TEE  12/13/2018. - Continue coumadin per coumadin clinic.  - Has resultant RV strain due longstanding MV disease - Reinforced need for SBE prophylaxis 3. 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 - Completed gentamicin therapy 11/17/18 and IV PCN 12/17/2018 - Saw ID 01/04/19 - F/u TEE 1/20 no vegetation. Valve functioning well.  4. H/o VT/VF Arrest - No change.  5. Chronic DVT - Continue coumadin as above.No change.  6. Lymphedema in left leg - Continue lasix 40 mg twice weekly. Stable.  - Continue compression stockings.   7. HTN - Controlled. 8. CAD - Cath 12/16 (pre-MVR) distal LCx 45% - No chest pain.  - Continue ASA/statin   Darrick Grinder, NP  9:53 AM   Patient seen and examined with the above-signed Advanced Practice Provider and/or Housestaff. I personally reviewed laboratory data, imaging studies and relevant notes. I independently examined the patient and formulated the important aspects of the plan. I have edited the note to reflect any of my changes or salient points. I have personally discussed the plan with the patient and/or family.  Continues to do very well. NYHA I. Volume status looks good. Maintaining NSR. No bleeding on warfarin.   General:  Well appearing. No resp difficulty HEENT: normal Neck: supple. no JVD. Carotids 2+ bilat; no bruits. No lymphadenopathy or thryomegaly appreciated. Cor: PMI nondisplaced. Regular rate & rhythm. Mechanical s1 Lungs: clear Abdomen: soft, nontender, nondistended. No hepatosplenomegaly. No bruits or masses. Good bowel sounds. Extremities: no cyanosis, clubbing, rash, edema Neuro: alert & orientedx3, cranial nerves grossly intact. moves all 4 extremities w/o difficulty. Affect pleasant  He's doing great. Maintaining NSR. No bleeding with coumadin. Reminded him on SBE prophylaxis.   F/u 1 year with echo.   Glori Bickers, MD  10:49 AM

## 2020-10-20 ENCOUNTER — Other Ambulatory Visit: Payer: Self-pay

## 2020-10-20 ENCOUNTER — Encounter (HOSPITAL_COMMUNITY): Payer: Self-pay | Admitting: Internal Medicine

## 2020-10-20 ENCOUNTER — Ambulatory Visit (HOSPITAL_COMMUNITY)
Admission: RE | Admit: 2020-10-20 | Discharge: 2020-10-20 | Disposition: A | Discharge status: Home / Self Care | Payer: Managed Care, Other (non HMO) | Source: Ambulatory Visit | Attending: Internal Medicine | Admitting: Internal Medicine

## 2020-10-20 VITALS — BP 138/80 | HR 64 | Wt 245.2 lb

## 2020-10-20 DIAGNOSIS — Z86718 Personal history of other venous thrombosis and embolism: Secondary | ICD-10-CM | POA: Diagnosis not present

## 2020-10-20 DIAGNOSIS — Z952 Presence of prosthetic heart valve: Secondary | ICD-10-CM | POA: Diagnosis not present

## 2020-10-20 DIAGNOSIS — I340 Nonrheumatic mitral (valve) insufficiency: Secondary | ICD-10-CM | POA: Diagnosis not present

## 2020-10-20 DIAGNOSIS — I4892 Unspecified atrial flutter: Secondary | ICD-10-CM | POA: Diagnosis not present

## 2020-10-20 DIAGNOSIS — G4733 Obstructive sleep apnea (adult) (pediatric): Secondary | ICD-10-CM | POA: Insufficient documentation

## 2020-10-20 DIAGNOSIS — Z7982 Long term (current) use of aspirin: Secondary | ICD-10-CM | POA: Diagnosis not present

## 2020-10-20 DIAGNOSIS — Z79899 Other long term (current) drug therapy: Secondary | ICD-10-CM | POA: Insufficient documentation

## 2020-10-20 DIAGNOSIS — I890 Lymphedema, not elsewhere classified: Secondary | ICD-10-CM | POA: Diagnosis not present

## 2020-10-20 DIAGNOSIS — B954 Other streptococcus as the cause of diseases classified elsewhere: Secondary | ICD-10-CM | POA: Diagnosis not present

## 2020-10-20 DIAGNOSIS — I4891 Unspecified atrial fibrillation: Secondary | ICD-10-CM | POA: Insufficient documentation

## 2020-10-20 DIAGNOSIS — Z954 Presence of other heart-valve replacement: Secondary | ICD-10-CM | POA: Diagnosis not present

## 2020-10-20 DIAGNOSIS — Z452 Encounter for adjustment and management of vascular access device: Secondary | ICD-10-CM | POA: Diagnosis not present

## 2020-10-20 DIAGNOSIS — Z87891 Personal history of nicotine dependence: Secondary | ICD-10-CM | POA: Diagnosis not present

## 2020-10-20 DIAGNOSIS — R7881 Bacteremia: Secondary | ICD-10-CM | POA: Insufficient documentation

## 2020-10-20 DIAGNOSIS — I2510 Atherosclerotic heart disease of native coronary artery without angina pectoris: Secondary | ICD-10-CM | POA: Diagnosis not present

## 2020-10-20 DIAGNOSIS — I484 Atypical atrial flutter: Secondary | ICD-10-CM | POA: Diagnosis not present

## 2020-10-20 DIAGNOSIS — I5022 Chronic systolic (congestive) heart failure: Secondary | ICD-10-CM | POA: Insufficient documentation

## 2020-10-20 DIAGNOSIS — I110 Hypertensive heart disease with heart failure: Secondary | ICD-10-CM | POA: Insufficient documentation

## 2020-10-20 DIAGNOSIS — Z7901 Long term (current) use of anticoagulants: Secondary | ICD-10-CM | POA: Diagnosis not present

## 2020-10-20 LAB — BASIC METABOLIC PANEL
Anion gap: 10 (ref 5–15)
BUN: 16 mg/dL (ref 6–20)
CO2: 23 mmol/L (ref 22–32)
Calcium: 9.1 mg/dL (ref 8.9–10.3)
Chloride: 108 mmol/L (ref 98–111)
Creatinine, Ser: 0.87 mg/dL (ref 0.61–1.24)
GFR, Estimated: >60 mL/min (ref >60)
Glucose, Bld: 97 mg/dL (ref 70–99)
Potassium: 4.5 mmol/L (ref 3.5–5.1)
Sodium: 141 mmol/L (ref 135–145)

## 2020-10-20 NOTE — Patient Instructions (Signed)
Lab work done today ,we will  call you with any abnormal results   Your physician has requested that you have an echocardiogram. Echocardiography is a painless test that uses sound waves to create images of your heart. It provides your doctor with information about the size and shape of your heart and how well your heart's chambers and valves are working. This procedure takes approximately one hour. There are no restrictions for this procedure. In one year.    Please call office for a follow up appointment in October 2022 with an echocardiogram  At the [facility], you and your health needs are our priority. As part of our continuing mission to provide you with exceptional heart care, we have created designated Provider Care Teams. These Care Teams include your primary Cardiologist (physician) and Advanced Practice Providers (APPs- Physician Assistants and Nurse Practitioners) who all work together to provide you with the care you need, when you need it.   You may see any of the following providers on your designated Care Team at your next follow up: Marland Kitchen Dr Glori Bickers . Dr Loralie Champagne . Darrick Grinder, NP . Lyda Jester, PA . Audry Riles, PharmD   Please be sure to bring in all your medications bottles to every appointment.

## 2020-10-24 ENCOUNTER — Other Ambulatory Visit (HOSPITAL_COMMUNITY): Payer: Self-pay | Admitting: Internal Medicine

## 2020-10-28 ENCOUNTER — Other Ambulatory Visit: Payer: Self-pay

## 2020-10-28 MED ORDER — WARFARIN SODIUM 5 MG PO TABS
ORAL_TABLET | ORAL | 1 refills | Status: DC
Start: 2020-10-28 — End: 2020-12-25

## 2020-10-28 NOTE — Telephone Encounter (Signed)
Faxed request to [facility] yesterday. Received 2nd faxed request for Warfarin refill from [facility] on [address] today. Please address refill.  Thanks

## 2020-11-14 ENCOUNTER — Other Ambulatory Visit: Payer: Self-pay

## 2020-11-14 ENCOUNTER — Ambulatory Visit (INDEPENDENT_AMBULATORY_CARE_PROVIDER_SITE_OTHER): Payer: Managed Care, Other (non HMO)

## 2020-11-14 DIAGNOSIS — Z7901 Long term (current) use of anticoagulants: Secondary | ICD-10-CM

## 2020-11-14 DIAGNOSIS — Z954 Presence of other heart-valve replacement: Secondary | ICD-10-CM | POA: Diagnosis not present

## 2020-11-14 DIAGNOSIS — I4892 Unspecified atrial flutter: Secondary | ICD-10-CM | POA: Diagnosis not present

## 2020-11-14 LAB — POCT INR: INR: 2.3 (ref 2.0–3.0)

## 2020-11-14 NOTE — Patient Instructions (Signed)
Take 2 tablets today only and then increase to 7.5mg  daily.  Repeat INR in 4 weeks.

## 2020-12-12 ENCOUNTER — Ambulatory Visit (INDEPENDENT_AMBULATORY_CARE_PROVIDER_SITE_OTHER): Payer: Managed Care, Other (non HMO)

## 2020-12-12 ENCOUNTER — Other Ambulatory Visit: Payer: Self-pay

## 2020-12-12 DIAGNOSIS — I4892 Unspecified atrial flutter: Secondary | ICD-10-CM | POA: Diagnosis not present

## 2020-12-12 DIAGNOSIS — Z7901 Long term (current) use of anticoagulants: Secondary | ICD-10-CM | POA: Diagnosis not present

## 2020-12-12 DIAGNOSIS — Z954 Presence of other heart-valve replacement: Secondary | ICD-10-CM | POA: Diagnosis not present

## 2020-12-12 LAB — POCT INR: INR: 1.7 — AB (ref 2.0–3.0)

## 2020-12-12 NOTE — Patient Instructions (Signed)
Take 2.5 tablets today only and then increase to 1.5 tablets daily except 2 tablets on Tuesday and Thursday.  Repeat INR in 4 weeks.

## 2020-12-25 ENCOUNTER — Other Ambulatory Visit: Payer: Self-pay | Admitting: Internal Medicine

## 2021-01-09 ENCOUNTER — Ambulatory Visit (INDEPENDENT_AMBULATORY_CARE_PROVIDER_SITE_OTHER): Payer: Managed Care, Other (non HMO)

## 2021-01-09 ENCOUNTER — Other Ambulatory Visit: Payer: Self-pay

## 2021-01-09 DIAGNOSIS — I4892 Unspecified atrial flutter: Secondary | ICD-10-CM

## 2021-01-09 DIAGNOSIS — Z7901 Long term (current) use of anticoagulants: Secondary | ICD-10-CM

## 2021-01-09 DIAGNOSIS — Z954 Presence of other heart-valve replacement: Secondary | ICD-10-CM | POA: Diagnosis not present

## 2021-01-09 LAB — POCT INR: INR: 2.5 (ref 2.0–3.0)

## 2021-01-09 NOTE — Patient Instructions (Signed)
Continue taking 1.5 tablets daily except 2 tablets on Tuesday and Thursday.  Repeat INR in 6 weeks.

## 2021-02-23 ENCOUNTER — Other Ambulatory Visit: Payer: Self-pay | Admitting: Internal Medicine

## 2021-02-23 ENCOUNTER — Other Ambulatory Visit: Payer: Self-pay

## 2021-02-23 ENCOUNTER — Ambulatory Visit (INDEPENDENT_AMBULATORY_CARE_PROVIDER_SITE_OTHER): Payer: Managed Care, Other (non HMO)

## 2021-02-23 DIAGNOSIS — Z7901 Long term (current) use of anticoagulants: Secondary | ICD-10-CM

## 2021-02-23 DIAGNOSIS — Z954 Presence of other heart-valve replacement: Secondary | ICD-10-CM | POA: Diagnosis not present

## 2021-02-23 DIAGNOSIS — I4892 Unspecified atrial flutter: Secondary | ICD-10-CM

## 2021-02-23 LAB — POCT INR: INR: 4 — AB (ref 2.0–3.0)

## 2021-02-23 NOTE — Patient Instructions (Signed)
Hold tonight only and then Continue taking 1.5 tablets daily except 2 tablets on Tuesday and Thursday.  Repeat INR in 6 weeks.

## 2021-04-06 ENCOUNTER — Other Ambulatory Visit: Payer: Self-pay

## 2021-04-06 ENCOUNTER — Ambulatory Visit (INDEPENDENT_AMBULATORY_CARE_PROVIDER_SITE_OTHER): Payer: Managed Care, Other (non HMO)

## 2021-04-06 DIAGNOSIS — I4892 Unspecified atrial flutter: Secondary | ICD-10-CM | POA: Diagnosis not present

## 2021-04-06 DIAGNOSIS — Z7901 Long term (current) use of anticoagulants: Secondary | ICD-10-CM

## 2021-04-06 DIAGNOSIS — Z954 Presence of other heart-valve replacement: Secondary | ICD-10-CM | POA: Diagnosis not present

## 2021-04-06 LAB — POCT INR: INR: 5.1 — AB (ref 2.0–3.0)

## 2021-04-06 NOTE — Patient Instructions (Signed)
Hold tonight and tomorrow only and then decrease to 1.5 tablets daily except 2 tablets on Tuesday.  Repeat INR in 6 weeks.

## 2021-04-24 ENCOUNTER — Other Ambulatory Visit: Payer: Self-pay | Admitting: Internal Medicine

## 2021-05-06 ENCOUNTER — Ambulatory Visit (INDEPENDENT_AMBULATORY_CARE_PROVIDER_SITE_OTHER): Payer: Managed Care, Other (non HMO)

## 2021-05-06 ENCOUNTER — Other Ambulatory Visit: Payer: Self-pay

## 2021-05-06 DIAGNOSIS — I4892 Unspecified atrial flutter: Secondary | ICD-10-CM

## 2021-05-06 DIAGNOSIS — Z954 Presence of other heart-valve replacement: Secondary | ICD-10-CM

## 2021-05-06 DIAGNOSIS — Z7901 Long term (current) use of anticoagulants: Secondary | ICD-10-CM | POA: Diagnosis not present

## 2021-05-06 LAB — POCT INR: INR: 3 (ref 2.0–3.0)

## 2021-05-06 NOTE — Patient Instructions (Addendum)
Continue taking 1.5 tablets daily except 2 tablets on Tuesday.  Repeat INR in 7 weeks.

## 2021-06-24 ENCOUNTER — Ambulatory Visit (INDEPENDENT_AMBULATORY_CARE_PROVIDER_SITE_OTHER): Payer: Managed Care, Other (non HMO)

## 2021-06-24 ENCOUNTER — Other Ambulatory Visit: Payer: Self-pay

## 2021-06-24 DIAGNOSIS — Z7901 Long term (current) use of anticoagulants: Secondary | ICD-10-CM

## 2021-06-24 DIAGNOSIS — I4892 Unspecified atrial flutter: Secondary | ICD-10-CM

## 2021-06-24 DIAGNOSIS — Z954 Presence of other heart-valve replacement: Secondary | ICD-10-CM | POA: Diagnosis not present

## 2021-06-24 LAB — POCT INR: INR: 2.3 (ref 2.0–3.0)

## 2021-06-24 NOTE — Patient Instructions (Signed)
Take 2 tablets today only and then Continue taking 1.5 tablets daily except 2 tablets on Tuesday.  Repeat INR in 3 weeks.

## 2021-07-15 ENCOUNTER — Other Ambulatory Visit: Payer: Self-pay

## 2021-07-15 ENCOUNTER — Ambulatory Visit (INDEPENDENT_AMBULATORY_CARE_PROVIDER_SITE_OTHER): Payer: Managed Care, Other (non HMO)

## 2021-07-15 DIAGNOSIS — Z954 Presence of other heart-valve replacement: Secondary | ICD-10-CM

## 2021-07-15 DIAGNOSIS — Z7901 Long term (current) use of anticoagulants: Secondary | ICD-10-CM | POA: Diagnosis not present

## 2021-07-15 DIAGNOSIS — I4892 Unspecified atrial flutter: Secondary | ICD-10-CM | POA: Diagnosis not present

## 2021-07-15 LAB — POCT INR: INR: 3.2 — AB (ref 2.0–3.0)

## 2021-07-15 NOTE — Patient Instructions (Signed)
Continue taking 1.5 tablets daily except 2 tablets on Tuesday.  Repeat INR in 6 weeks.

## 2021-08-10 NOTE — Progress Notes (Deleted)
PATIENT: Oscar Brown DOB: 10/13/1966  REASON FOR VISIT: follow up HISTORY FROM: patient  Virtual Visit via Telephone Note  I connected with Oscar Brown on 08/10/21 at  7:30 AM EDT by telephone and verified that I am speaking with the correct person using two identifiers.   I discussed the limitations, risks, security and privacy concerns of performing an evaluation and management service by telephone and the availability of in person appointments. I also discussed with the patient that there may be a patient responsible charge related to this service. The patient expressed understanding and agreed to proceed.   History of Present Illness:  08/10/21 ALL: Oscar Brown is a 55 y.o. male here today for follow up for OSA on CPAP. He continues to use CPAP regularly.     History (copied from Dr Guadelupe Sabin previous note)  Oscar Brown is a 55 year old right-handed gentleman with an underlying medical history of mitral regurgitation with status post mitral valve replacement in 2016, on Coumadin, hx of Vfib arrest, Hx of recurrent Aflutter, hyperlipidemia, history of DVT, COPD, history of atrial fibrillation, history of lymphedema, right lower extremity cellulitis, and obesity, who Presents for follow-up consultation of his obstructive sleep apnea.  Recent home sleep testing and starting AutoPap therapy.  Patient is unaccompanied today.  I first met him at the request of his primary care physician, on 03/11/2020, at which time the patient reported a prior diagnosis of obstructive sleep apnea and he was on CPAP therapy. He was not fully compliant with treatment at that time and needed new equipment and new supplies. He was eligible for new equipment.  He was advised to proceed with sleep testing, he had a home sleep test on 04/14/2020 which indicated moderate obstructive sleep apnea with an AHI of 26.6/h, O2 nadir of 84%.  He was advised to proceed with home AutoPap therapy.  His set up date  with the new equipment was 05/27/2020.   Today, 07/29/2020: I reviewed his AutoPap compliance data from 06/28/2020 through 07/27/2020, at which time he used his AutoPap every night with percent use days greater than 4 hours at 100%, indicating superb compliance with an average usage of 5 hours and 42 minutes, residual AHI at goal at 0.7/h, average pressure for the 95th percentile at 11.8 cm with a range of 7 to 14 cm, leak acceptable with a 95th percentile at 10.7 L/min.  He reports doing well, he has adjusted well to AutoPap therapy and is fully compliant with it.  He feels improved, particularly with regards to his daytime energy and daytime somnolence.  He is quite pleased with how he is doing.  He is using a nasal mask, similar to his previous mask style.  He is up-to-date with his supplies and very motivated to continue with treatment.  He has been sleeping an average of 6 hours, sometimes as long as 7 hours.    Observations/Objective:  Generalized: Well developed, in no acute distress  Mentation: Alert oriented to time, place, history taking. Follows all commands speech and language fluent   Assessment and Plan:  55 y.o. year old male  has a past medical history of Acute diastolic congestive heart failure ([place]) (09/21/2015), Acute renal failure ([place]), Atrial fibrillation ([place]), Chronic venous hypertension due to deep vein thrombosis (12/14/2006), COPD (chronic obstructive pulmonary disease) ([place]), DVT (deep venous thrombosis) ([place]) (early 80's), HLD (hyperlipidemia), Incidental lung nodule, less than or equal to 30m (11/10/2015), Lymphedema (early 80's), Mitral regurgitation (10/06/2012), Obstructive sleep apnea (  12/14/2006), OSA on CPAP, and S/P minimally invasive mitral valve replacement with metallic valve (97/67/3419). here with  No diagnosis found.  He continues to do well with CPAP therapy. Compliance report reveals optimal usage. He was encouraged to continue CPAP nightly and greater than 4  hours each night. I will update supply orders, today. He will continue healthy lifestyle habits and follow up in 1 year, sooner if needed.   No orders of the defined types were placed in this encounter.   No orders of the defined types were placed in this encounter.    Follow Up Instructions:  I discussed the assessment and treatment plan with the patient. The patient was provided an opportunity to ask questions and all were answered. The patient agreed with the plan and demonstrated an understanding of the instructions.   The patient was advised to call back or seek an in-person evaluation if the symptoms worsen or if the condition fails to improve as anticipated.  I provided *** minutes of non-face-to-face time during this encounter. Patient located at their place of residence during [place] visit. Provider is in the office.    Debbora Presto, NP

## 2021-08-11 ENCOUNTER — Telehealth: Payer: Managed Care, Other (non HMO) | Admitting: Family Medicine

## 2021-08-11 DIAGNOSIS — G4733 Obstructive sleep apnea (adult) (pediatric): Secondary | ICD-10-CM

## 2021-08-26 ENCOUNTER — Ambulatory Visit: Payer: Managed Care, Other (non HMO)

## 2021-08-26 ENCOUNTER — Other Ambulatory Visit: Payer: Self-pay

## 2021-08-26 DIAGNOSIS — Z7901 Long term (current) use of anticoagulants: Secondary | ICD-10-CM

## 2021-08-26 DIAGNOSIS — Z954 Presence of other heart-valve replacement: Secondary | ICD-10-CM | POA: Diagnosis not present

## 2021-08-26 DIAGNOSIS — I4892 Unspecified atrial flutter: Secondary | ICD-10-CM | POA: Diagnosis not present

## 2021-08-26 LAB — POCT INR: INR: 2.6 (ref 2.0–3.0)

## 2021-08-26 NOTE — Patient Instructions (Signed)
Continue taking 1.5 tablets daily except 2 tablets on Tuesday.  Repeat INR in 6 weeks.

## 2021-10-07 ENCOUNTER — Other Ambulatory Visit: Payer: Self-pay

## 2021-10-07 ENCOUNTER — Ambulatory Visit: Payer: Managed Care, Other (non HMO)

## 2021-10-07 DIAGNOSIS — Z7901 Long term (current) use of anticoagulants: Secondary | ICD-10-CM | POA: Diagnosis not present

## 2021-10-07 DIAGNOSIS — I4892 Unspecified atrial flutter: Secondary | ICD-10-CM

## 2021-10-07 DIAGNOSIS — Z954 Presence of other heart-valve replacement: Secondary | ICD-10-CM | POA: Diagnosis not present

## 2021-10-07 LAB — POCT INR: INR: 2.6 (ref 2.0–3.0)

## 2021-10-07 NOTE — Patient Instructions (Signed)
Continue taking 1.5 tablets daily except 2 tablets on Tuesday.  Repeat INR in 8 weeks.

## 2021-10-19 ENCOUNTER — Encounter: Payer: Self-pay | Admitting: Gastroenterology

## 2021-10-24 ENCOUNTER — Other Ambulatory Visit: Payer: Self-pay | Admitting: Internal Medicine

## 2021-10-26 NOTE — Telephone Encounter (Signed)
Prescription refill request received for warfarin Lov: 10/20/2020 Next INR check: 5mg   Warfarin tablet strength: 5mg 

## 2021-10-26 NOTE — Telephone Encounter (Signed)
Pt overdue to see cardiologist. Put on next INR visit pt will need to schedule appointment to see cardiologist. Will also send message to schedulers.

## 2021-10-27 NOTE — Progress Notes (Signed)
Cardiology Office Note Date:  10/28/2021  Patient ID:  Oscar Brown, Oscar Brown [birth year], MRN [id number] PCP:  Oscar Organ., MD  Cardiologist:  Dr. Haroldine Laws Electrophysiologist: Dr. Lovena Le    Chief Complaint: over due EP  History of Present Illness: Oscar Brown is a 55 y.o. 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 On POD #3  he had a sudden VF arrest with nearly an hour of resuscitation time. Follow-up transthoracic and transesophageal echocardiograms performed after the arrest revealed normal function of the mechanical prosthetic valve with severe dysfunction involving the inferior wall of the left ventricle EF35-40%.  Cardiac enzymes went quite high and he had some bradycardia and AV block, all suggestive of possible inferior wall myocardial infarction. However, left ventricular function completely normalized prior to hospital discharge. Plans for diagnostic cardiac catheterization were postponed because the patient developed acute non-oliguric renal failure with creatinine up to 7. Because the patient's rhythm and left ventricular systolic function recovered completely, it was felt that the patient did not need defibrillator implantation.  The patient was ultimately discharged from the hospital on the 18th postoperative day.   He comes today to be seen for Dr. Lovena Le, last seen by him May 2019, at that time post ablation, doing well, no changes were made.  He has followed with HF team, last saw Dr. Haroldine Laws 09/2020, also ding well, planned for an annual  visit/echo   TODAY He is doing great Works at [employer] and is constantly moving, carrying boxes, loading/unloaduing trucks, pallets, heavy and physical work with excellent exertional capacity He does not think he has ever had AFlutter again Denies any CP, palpitations or cardiac awareness No near syncope or syncope No SOB  INRs have been stable, monitored by our coumadin cl inic No bleeding or signs of bleeding PMD does his lipids/labs otherwise   Past Medical History:  Diagnosis Date   Acute diastolic congestive heart failure ([place]) 09/21/2015   takes Lasix daily   Acute renal failure ([place])    ATN s/p cardiac arrest   Atrial fibrillation ([place])    Chronic venous hypertension due to deep vein thrombosis 12/14/2006   1993.  Complicated by chronic left lower extremity edema and occasional recurrent left lower extremity cellulitis    COPD (chronic obstructive pulmonary disease) (H[facility])    DVT (deep venous thrombosis) ([place]) early 80's   left leg   HLD (hyperlipidemia)    Incidental lung nodule, less than or equal to 96mm 11/10/2015   3 mm nodule right lung   Lymphedema early 80's   Mitral regurgitation 10/06/2012   Obstructive sleep apnea 12/14/2006   AHI 19.2/hr, desaturation to 75% on Polysomnography 02/26/2005.  Uses 11 cm H2O nocturnal nasal CPAP with good symptom control.    OSA on CPAP    S/P minimally invasive mitral valve replacement with metallic valve 40/98/1191   33 mm Carbomedics Optiform bileaflet mechanical prosthesis placed via right mini-thoracotomy approach    Past Surgical History:  Procedure Laterality Date   A-FLUTTER ABLATION N/A 03/20/2018   Procedure: A-FLUTTER ABLATION;  Surgeon: Evans Lance, MD;  Location: [place] [facility];  Service: Cardiovascular;  Laterality: N/A;  CARDIAC CATHETERIZATION N/A 11/03/2015   Procedure: Right/Left Heart Cath and Coronary Angiography;  Surgeon: Leonie Man, MD;  Location: [place] [facility];  Service:  Cardiovascular;  Laterality: N/A;   CARDIOVERSION N/A 12/23/2017   Procedure: CARDIOVERSION;  Surgeon: Acie Fredrickson Wonda Cheng, MD;  Location[facility] ENDOSCOPY;  Service: Cardiovascular;  Laterality: N/A;   MITRAL VALVE REPLACEMENT Right 11/13/2015   Procedure: MINIMALLY INVASIVE MITRAL VALVE (MV) REPLACEMENT;  Surgeon: [name], MD;  Location: [place];  Service: Open Heart Surgery;  Laterality: Right;   MULTIPLE EXTRACTIONS WITH ALVEOLOPLASTY N/A 11/07/2015   Procedure: Extraction of tooth #'s 1,2,8,9,12,15,16,17, 32 with alveoloplasty and gross debridement of remaining teeth.;  Surgeon: [name], DDS;  Location: [place];  Service: Oral Surgery;  Laterality: N/A;   TEE WITHOUT CARDIOVERSION N/A 10/16/2015   Procedure: TRANSESOPHAGEAL ECHOCARDIOGRAM (TEE);  Surgeon: Skeet Latch, MD;  Location: [place];  Service: Cardiovascular;  Laterality: N/A;   TEE WITHOUT CARDIOVERSION N/A 11/13/2015   Procedure: TRANSESOPHAGEAL ECHOCARDIOGRAM (TEE);  Surgeon: [name], MD;  Location: [place];  Service: Open Heart Surgery;  Laterality: N/A;   TEE WITHOUT CARDIOVERSION N/A 11/03/2018   Procedure: TRANSESOPHAGEAL ECHOCARDIOGRAM (TEE);  Surgeon: Dorothy Spark, MD;  Location: [facility] ENDOSCOPY;  Service: Cardiovascular;  Laterality: N/A;   TEE WITHOUT CARDIOVERSION N/A 12/13/2018   Procedure: TRANSESOPHAGEAL ECHOCARDIOGRAM (TEE);  Surgeon: Jolaine Artist, MD;  Location: [facility] ENDOSCOPY;  Service: Cardiovascular;  Laterality: N/A;    Current Outpatient Medications  Medication Sig Dispense Refill   aspirin (ASPIR-81) 81 MG EC tablet Take 1 tablet (81 mg total) by mouth daily. Swallow whole. 30 tablet 12   EPINEPHrine 0.3 mg/0.3 mL IJ SOAJ injection as needed.     furosemide (LASIX) 40 MG tablet TAKE 1 TABLET BY MOUTH TWICE WEEKLY ON MONDAYS AND FRIDAYS; MAKE DOCTOR'S APPOINTMENT ! 24 tablet 2   rosuvastatin (CRESTOR) 20 MG tablet Take 20 mg by mouth daily.     warfarin (COUMADIN) 5 MG tablet TAKE 1  AND 1/2 TABLETS  TO 2 TABLETS BY MOUTH DAILY AS DIRECTED BY COUMADIN CLINIC 160 tablet 0   No current facility-administered medications for this visit.    Allergies:   Iohexol   Social History:  The patient  reports that he quit smoking about 9 years ago. His smoking use included cigarettes. He has a 0.90 pack-year smoking history. He has never used smokeless tobacco. He reports current alcohol use. He reports that he does not use drugs.   Family History:  The patient's family history includes Cancer in his mother; Diabetes in his paternal aunt; Other in his father.  ROS:  Please see the history of present illness.    All other systems are reviewed and otherwise negative.   PHYSICAL EXAM:  VS:  BP (!) 130/92   Pulse 64   Ht 5\' 7"  (1.702 m)   Wt 223 lb (101.2 kg)   SpO2 96%   BMI 34.93 kg/m  BMI: Body mass index is 34.93 kg/m. Well nourished, well developed, in no acute distress HEENT: normocephalic, atraumatic Neck: no JVD, carotid bruits or masses Cardiac:  RRR; valve is appreciated, no significant murmurs, no rubs, or gallops Lungs:  CTA b/l, no wheezing, rhonchi or rales Abd: soft, nontender MS: no deformity or atrophy Ext: no edema Skin: warm and dry, no rash Neuro:  No gross deficits appreciated Psych: euthymic mood, full affect     EKG:  Done today and reviewed by myself shows  SR  64bpm, normal intervals  TEE 12/13/2018 LEFT VENTRICLE: EF = 55-60%. No regional wall motion abnormalities. LEFT ATRIUM: Dilated LEFT ATRIAL APPENDAGE: No thrombus.  AORTIC VALVE:  Trileaflet. No AI/AS. No vegetation. MITRAL VALVE:    Mechanical MV. Functioning well. No MR. Mild MS mean gradient 33mmHG. Previously seen vegetation no longer present  TRICUSPID VALVE: Normal. Mild TR. No vegetation. PULMONIC VALVE: Grossly normal. No vegetation. INTERATRIAL SEPTUM: No PFO or ASD.  DESCENDING AORTA: Mild plaque   TEE 11/03/18 There is a highly mobile echodensity attached to the anterior  portion of the sewing ring of the mechanical mitral valve measuring 12 x 5 mm consistent with prosthesis vegetation.   03/20/2018: EPS/ablation CONCLUSIONS:  1. Inducible isthmus-dependent right atrial flutter.  2. Successful radiofrequency ablation of atrial flutter along the cavotricuspid isthmus with complete bidirectional isthmus block achieved.  3. No inducible arrhythmias following ablation.  4. No early apparent complications.    Echo 02/20/18 EF ~50% inferior HK RV mild to moderately HK. Septal flattening.  Echo 2/17 EF 45-50% inferior HK. MV ok    11/03/2015: LHC Angiographically minimal CAD with most significant being 45% distal circumflex-OM 3 stenosis The left ventricular systolic function is normal -hand injection left ventricular angiography did not allow for significant evaluation of MR. Normal right heart cath pressures but no pulmonary hypertension. No large V wave noted on the PCWP tracing. TEE documentation of Severe MR   Recent Labs: No results found for requested labs within last 8760 hours.  No results found for requested labs within last 8760 hours.   CrCl cannot be calculated (Patient's most recent lab result is older than the maximum 21 days allowed.).   Wt Readings from Last 3 Encounters:  10/28/21 223 lb (101.2 kg)  10/20/20 245 lb 3.2 oz (111.2 kg)  07/29/20 236 lb (107 kg)     Other studies reviewed: Additional studies/records reviewed today include: summarized above  ASSESSMENT AND PLAN:  AFlutter Ablated 2019 MVR (mechanical) Hx of endocarditis treated medically 2019 TEE 2020 resolved DVT  On warfarin, monitored and managed with our coumadin clinic  NICM Recovered LVEF Lymphedema No symptoms or exam findings to suggest volume OL or clinical change  HTN He thinks unusually a little elevated getting her this AM with traffic/rain  Non-obstructive CAD No anginal symptoms  Disposition: F/u with Dr. Simon Rhein as usual, EP in a year  or PRN  Current medicines are reviewed at length with the patient today.  The patient did not have any concerns regarding medicines.  Venetia Night, PA-C 10/28/2021 11:32 AM     [address] [place] [address] [phone] (office)  [phone] (fax)

## 2021-10-28 ENCOUNTER — Encounter: Payer: Self-pay | Admitting: Physician Assistant

## 2021-10-28 ENCOUNTER — Other Ambulatory Visit: Payer: Self-pay

## 2021-10-28 ENCOUNTER — Ambulatory Visit: Payer: Managed Care, Other (non HMO) | Admitting: Physician Assistant

## 2021-10-28 VITALS — BP 130/92 | HR 64 | Ht 67.0 in | Wt 223.0 lb

## 2021-10-28 DIAGNOSIS — I10 Essential (primary) hypertension: Secondary | ICD-10-CM

## 2021-10-28 DIAGNOSIS — I428 Other cardiomyopathies: Secondary | ICD-10-CM | POA: Diagnosis not present

## 2021-10-28 DIAGNOSIS — I4892 Unspecified atrial flutter: Secondary | ICD-10-CM | POA: Diagnosis not present

## 2021-10-28 DIAGNOSIS — Z952 Presence of prosthetic heart valve: Secondary | ICD-10-CM

## 2021-10-28 NOTE — Patient Instructions (Addendum)
Medication Instructions:   Your physician recommends that you continue on your current medications as directed. Please refer to the Current Medication list given to you today.  *If you need a refill on your cardiac medications before your next appointment, please call your pharmacy*   Lab Work: [place]   If you have labs (blood work) drawn today and your tests are completely normal, you will receive your results only by: [place] (if you have MyChart) OR A paper copy in the mail If you have any lab test that is abnormal or we need to change your treatment, we will call you to review the results.   Testing/Procedures:  NONE ORDERED  TODAY     Follow-Up: At [facility], you and your health needs are our priority.  As part of our continuing mission to provide you with exceptional heart care, we have created designated Provider Care Teams.  These Care Teams include your primary Cardiologist (physician) and Advanced Practice Providers (APPs -  Physician Assistants and Nurse Practitioners) who all work together to provide you with the care you need, when you need it.  We recommend signing up for the patient portal called "MyChart".  Sign up information is provided on this After Visit Summary.  MyChart is used to connect with patients for Virtual Visits (Telemedicine).  Patients are able to view lab/test results, encounter notes, upcoming appointments, etc.  Non-urgent messages can be sent to your provider as well.   To learn more about what you can do with MyChart, go to [email or web address].    Your next appointment:  (IN RECALL) WITH DR BENSIMHOM   1 year(s)  or CONTACT [facility] [phone] AS NEEDED FOR  ANY CARDIAC RELATED SYMPTOMS   The format for your next appointment:   In Person  Provider:   You may see Dr. Lovena Le  or one of the following Advanced Practice Providers on your designated Care Team:     1}    Other Instructions

## 2021-11-17 NOTE — Progress Notes (Addendum)
Patient ID: Oscar Brown, male   DOB: 10-13-1966, 55 y.o.   MRN: [id number]     Advanced Heart Failure Clinic Note     Cardiologist: Dr Haroldine Laws  PCP: [name]  HPI: Oscar Brown is a 55 y.o. male  with hx OSA, rheumatic mitral valve disease with severe MR, AFL, DVT 12/19.   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 On POD #3  he had a sudden VF arrest with nearly an hour of resuscitation time. Follow-up transthoracic and transesophageal echocardiograms performed after the arrest revealed normal function of the mechanical prosthetic valve with severe dysfunction involving the inferior wall of the left ventricle EF 35-40%.  Cardiac enzymes went quite high and he had some bradycardia and AV block, all suggestive of possible inferior wall myocardial infarction. However, left ventricular function completely normalized prior to hospital discharge. Plans for diagnostic cardiac catheterization were postponed because the patient developed acute non-oliguric renal failure with creatinine up to 7. Because the patient's rhythm and left ventricular systolic function recovered completely, it was felt that the patient did not need defibrillator implantation.  The patient was ultimately discharged from the hospital on the 18th postoperative day.    Underwent successful RFA of AFL 03/20/18 with Dr. Lovena Le   Admitted 12/19 with LLE cellulitis. Started on empiric ABX. BCx positive for Streptococcus agalactiae. With mechanical MV, taken for TEE which showed highly mobile echo-density attached to the anterior portion of the sewing ring of the mechanical mitral valve measuring 12x5 mm consistent with prosthesis vegetation. Pt thought not to need surgical intervention. Completed 6 week course of IV ABX per ID. Also noted to have chronic DVT. Coumadin continued,  no other change.   Had TEE 12/13/2018 vegetation resolved. Valve working well.   He is here today for annual follow-up. Overall feeling fine. Denies SOB/PND/Orthopnea. Appetite ok. No fever or chills. Using CPAP every night. Taking all medications. Continue work full time at [employer]   TEE 12/13/2018 LEFT VENTRICLE: EF = 55-60%. No regional wall motion abnormalities. LEFT ATRIUM: Dilated LEFT ATRIAL APPENDAGE: No thrombus.  AORTIC VALVE:  Trileaflet. No AI/AS. No vegetation. MITRAL VALVE:    Mechanical MV. Functioning well. No MR. Mild MS mean gradient 70mmHG. Previously seen vegetation no longer present  TRICUSPID VALVE: Normal. Mild TR. No vegetation. PULMONIC VALVE: Grossly normal. No vegetation. INTERATRIAL SEPTUM: No PFO or ASD.  DESCENDING AORTA: Mild plaque  TEE 11/03/18 There is a highly mobile echodensity attached to the anterior portion of the sewing ring of the mechanical mitral valve measuring 12 x 5 mm consistent with prosthesis vegetation.  Echo 02/20/18 EF ~50% inferior HK RV mild to moderately HK. Septal flattening.  Echo 2/17 EF 45-50% inferior HK. MV ok   Review of systems complete and found to be negative unless listed in HPI.   SH:  Social History   Socioeconomic History   Marital status: Legally Separated    Spouse name: Not on file   Number of children: 3   Years of education: Not on file   Highest education level: Not on file  Occupational History   Not on file  Tobacco Use   Smoking status: Former    Packs/day: 0.10    Years: 9.00    Pack years: 0.90    Types: Cigarettes    Quit date: 09/05/2012    Years since quitting: 9.2  Smokeless tobacco: Never  Vaping Use   Vaping Use: Never used  Substance and Sexual Activity   Alcohol use: Yes    Alcohol/week: 0.0 standard drinks    Comment: 1 beer once a week or special occasions   Drug use: No   Sexual activity: Yes  Other Topics Concern   Not on file  Social History Narrative   Not on file    Social Determinants of Health   Financial Resource Strain: Not on file  Food Insecurity: Not on file  Transportation Needs: Not on file  Physical Activity: Not on file  Stress: Not on file  Social Connections: Not on file  Intimate Partner Violence: Not on file   FH:  - Denies history of sudden cardiac death.  Past Medical History:  Diagnosis Date   Acute diastolic congestive heart failure ([place]) 09/21/2015   takes Lasix daily   Acute renal failure ([facility])    ATN s/p cardiac arrest   Atrial fibrillation ([facility])    Chronic venous hypertension due to deep vein thrombosis 12/14/2006   1993.  Complicated by chronic left lower extremity edema and occasional recurrent left lower extremity cellulitis    COPD (chronic obstructive pulmonary disease) (H[facility])    DVT (deep venous thrombosis) ([place]) early 80's   left leg   HLD (hyperlipidemia)    Incidental lung nodule, less than or equal to 37mm 11/10/2015   3 mm nodule right lung   Lymphedema early 80's   Mitral regurgitation 10/06/2012   Obstructive sleep apnea 12/14/2006   AHI 19.2/hr, desaturation to 75% on Polysomnography 02/26/2005.  Uses 11 cm H2O nocturnal nasal CPAP with good symptom control.    OSA on CPAP    S/P minimally invasive mitral valve replacement with metallic valve 10/93/2355   33 mm Carbomedics Optiform bileaflet mechanical prosthesis placed via right mini-thoracotomy approach    Current Outpatient Medications  Medication Sig Dispense Refill   aspirin (ASPIR-81) 81 MG EC tablet Take 1 tablet (81 mg total) by mouth daily. Swallow whole. 30 tablet 12   EPINEPHrine 0.3 mg/0.3 mL IJ SOAJ injection as needed.     furosemide (LASIX) 40 MG tablet TAKE 1 TABLET BY MOUTH TWICE WEEKLY ON MONDAYS AND FRIDAYS; MAKE DOCTOR'S APPOINTMENT ! 24 tablet 2   rosuvastatin (CRESTOR) 20 MG tablet Take 20 mg by mouth daily.     warfarin (COUMADIN) 5 MG tablet TAKE 1 AND 1/2 TABLETS  TO 2 TABLETS BY MOUTH DAILY AS DIRECTED BY COUMADIN CLINIC 160  tablet 0   No current facility-administered medications for this encounter.   Vitals:   11/19/21 0903  BP: 130/78  Pulse: (!) 57  Weight: 106 kg (233 lb 9.6 oz)      Wt Readings from Last 3 Encounters:  11/19/21 106 kg (233 lb 9.6 oz)  10/28/21 101.2 kg (223 lb)  10/20/20 111.2 kg (245 lb 3.2 oz)    PHYSICAL EXAM: General:  Well appearing. No resp difficulty HEENT: normal Neck: supple. no JVD. Carotids 2+ bilat; no bruits. No lymphadenopathy or thryomegaly appreciated. Cor: PMI nondisplaced. Regular rate & rhythm. No rubs, gallops or murmurs. Mechanical S1 Lungs: clear Abdomen: soft, nontender, nondistended. No hepatosplenomegaly. No bruits or masses. Good bowel sounds. Extremities: no cyanosis, clubbing, rash, support stockings on bilaterally.  Neuro: alert & orientedx3, cranial nerves grossly intact. moves all 4 extremities w/o difficulty. Affect pleasant    ASSESSMENT & PLAN:  1. Recurrent A flutter, atypical - s/p DCCV 12/23/17. - s/p Aflutter ablation 03/20/18  -  On coumadin for MVR. - No bleeding issues.    2. Mitral regurgitation s/p mechanical MVR in 12/16 - Normal EF and stable without vegetation on TEE 12/13/2018. -- Continue coumadin per coumadin clinic.  - Has resultant RV strain due longstanding MV disease - Reinforced need for SBE prophylaxis - Repeat ECHO   3. 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 - Completed gentamicin therapy 11/17/18 and IV PCN 12/17/2018 - Saw ID 01/04/19 - F/u TEE 1/20 no vegetation. Valve functioning well.   4. H/o VT/VF Arrest - No change.   5. Chronic DVT - Continue coumadin as above. - 6. Lymphedema in left leg - Volume status stable. Continue lasix 40 mg twice weekly.  - Continue compression stockings.    7. HTN -Stable.   8. CAD - Cath 12/16 (pre-MVR) distal LCx 45% - No chest pain.  - Continue ASA/statin  9. OSA Continue nightly CPAP.   Repeat ECHO    Follow-up: 1 year    Alejah Aristizabal NP_C  9:21 AM

## 2021-11-18 ENCOUNTER — Telehealth (HOSPITAL_COMMUNITY): Payer: Self-pay

## 2021-11-18 NOTE — Telephone Encounter (Signed)
Called to confirm/remind patient of their appointment at the [facility] on 11/19/21.   Patient reminded to bring all medications and/or complete list.  Confirmed patient has transportation. Gave directions, instructed to utilize [place] parking.  Confirmed appointment prior to ending call.

## 2021-11-19 ENCOUNTER — Ambulatory Visit (HOSPITAL_COMMUNITY)
Admission: RE | Admit: 2021-11-19 | Discharge: 2021-11-19 | Disposition: A | Discharge status: Home / Self Care | Payer: Managed Care, Other (non HMO) | Source: Ambulatory Visit | Attending: Physician Assistant | Admitting: Physician Assistant

## 2021-11-19 ENCOUNTER — Other Ambulatory Visit: Payer: Self-pay

## 2021-11-19 VITALS — BP 130/78 | HR 57 | Wt 233.6 lb

## 2021-11-19 DIAGNOSIS — Z7982 Long term (current) use of aspirin: Secondary | ICD-10-CM | POA: Diagnosis not present

## 2021-11-19 DIAGNOSIS — I340 Nonrheumatic mitral (valve) insufficiency: Secondary | ICD-10-CM | POA: Diagnosis not present

## 2021-11-19 DIAGNOSIS — I10 Essential (primary) hypertension: Secondary | ICD-10-CM | POA: Diagnosis not present

## 2021-11-19 DIAGNOSIS — Z7901 Long term (current) use of anticoagulants: Secondary | ICD-10-CM | POA: Diagnosis not present

## 2021-11-19 DIAGNOSIS — I484 Atypical atrial flutter: Secondary | ICD-10-CM | POA: Insufficient documentation

## 2021-11-19 DIAGNOSIS — Z79899 Other long term (current) drug therapy: Secondary | ICD-10-CM | POA: Diagnosis not present

## 2021-11-19 DIAGNOSIS — G4733 Obstructive sleep apnea (adult) (pediatric): Secondary | ICD-10-CM | POA: Insufficient documentation

## 2021-11-19 DIAGNOSIS — I890 Lymphedema, not elsewhere classified: Secondary | ICD-10-CM | POA: Diagnosis not present

## 2021-11-19 DIAGNOSIS — Z8619 Personal history of other infectious and parasitic diseases: Secondary | ICD-10-CM | POA: Insufficient documentation

## 2021-11-19 DIAGNOSIS — Z9989 Dependence on other enabling machines and devices: Secondary | ICD-10-CM | POA: Diagnosis not present

## 2021-11-19 DIAGNOSIS — Z952 Presence of prosthetic heart valve: Secondary | ICD-10-CM | POA: Insufficient documentation

## 2021-11-19 DIAGNOSIS — I428 Other cardiomyopathies: Secondary | ICD-10-CM | POA: Diagnosis not present

## 2021-11-19 DIAGNOSIS — I5022 Chronic systolic (congestive) heart failure: Secondary | ICD-10-CM

## 2021-11-19 DIAGNOSIS — I2510 Atherosclerotic heart disease of native coronary artery without angina pectoris: Secondary | ICD-10-CM | POA: Diagnosis not present

## 2021-11-19 DIAGNOSIS — I82509 Chronic embolism and thrombosis of unspecified deep veins of unspecified lower extremity: Secondary | ICD-10-CM | POA: Insufficient documentation

## 2021-11-19 DIAGNOSIS — I4891 Unspecified atrial fibrillation: Secondary | ICD-10-CM | POA: Diagnosis not present

## 2021-11-19 DIAGNOSIS — Z8674 Personal history of sudden cardiac arrest: Secondary | ICD-10-CM | POA: Insufficient documentation

## 2021-11-19 NOTE — Patient Instructions (Addendum)
Thank you for coming in today   Your physician recommends that you schedule a follow-up appointment in: 12 months please call in October 2023 to make a appointment for December 2023   You will be scheduled for a echocardiogram   At the [facility], you and your health needs are our priority. As part of our continuing mission to provide you with exceptional heart care, we have created designated Provider Care Teams. These Care Teams include your primary Cardiologist (physician) and Advanced Practice Providers (APPs- Physician Assistants and Nurse Practitioners) who all work together to provide you with the care you need, when you need it.   You may see any of the following providers on your designated Care Team at your next follow up: Dr Glori Bickers Dr Haynes Kerns, NP Lyda Jester, [place] [facility] [place], [place] Audry Riles, PharmD   Please be sure to bring in all your medications bottles to every appointment.    If you have any questions or concerns before your next appointment please send [place] a message through [facility] or call our office at [phone].    TO LEAVE A MESSAGE FOR THE NURSE SELECT OPTION 2, PLEASE LEAVE A MESSAGE INCLUDING: YOUR NAME DATE OF BIRTH CALL BACK NUMBER REASON FOR CALL**this is important as we prioritize the call backs  YOU WILL RECEIVE A CALL BACK THE SAME DAY AS LONG AS YOU CALL BEFORE 4:00 PM

## 2021-11-25 ENCOUNTER — Ambulatory Visit: Payer: Managed Care, Other (non HMO) | Admitting: Family Medicine

## 2021-11-26 ENCOUNTER — Ambulatory Visit: Payer: Managed Care, Other (non HMO) | Admitting: Neurology

## 2021-12-02 ENCOUNTER — Ambulatory Visit: Payer: Managed Care, Other (non HMO)

## 2021-12-02 ENCOUNTER — Other Ambulatory Visit: Payer: Self-pay

## 2021-12-02 DIAGNOSIS — I4892 Unspecified atrial flutter: Secondary | ICD-10-CM | POA: Diagnosis not present

## 2021-12-02 DIAGNOSIS — Z954 Presence of other heart-valve replacement: Secondary | ICD-10-CM | POA: Diagnosis not present

## 2021-12-02 DIAGNOSIS — Z7901 Long term (current) use of anticoagulants: Secondary | ICD-10-CM | POA: Diagnosis not present

## 2021-12-02 LAB — POCT INR: INR: 2.2 (ref 2.0–3.0)

## 2021-12-02 NOTE — Patient Instructions (Signed)
TAKE 2 TABLETS TONIGHT ONLY and then Continue taking 1.5 tablets daily except 2 tablets on Tuesday.  Repeat INR in 4 weeks.

## 2021-12-03 ENCOUNTER — Encounter: Payer: Self-pay | Admitting: Neurology

## 2021-12-03 ENCOUNTER — Ambulatory Visit: Payer: Managed Care, Other (non HMO) | Admitting: Neurology

## 2021-12-03 NOTE — Progress Notes (Deleted)
PATIENT: Oscar Brown DOB: Oct 25, 1966  REASON FOR VISIT: Follow up for OSA on CPAP HISTORY FROM: Patient PRIMARY NEUROLOGIST: Dr. [name]  HISTORY OF PRESENT ILLNESS: Today 12/03/21 Mr. Gornick is here today for CPAP follow-up.  Download shows excellent compliance (see below), > 4 hours 30/30 days.  AHI is well treated 0.4.       HISTORY Copied Dr. Guadelupe Sabin Note 07/29/2020: Mr. Tinkham is a 56 year old right-handed gentleman with an underlying medical history of mitral regurgitation with status post mitral valve replacement in 2016, on Coumadin, hx of Vfib arrest, Hx of recurrent Aflutter, hyperlipidemia, history of DVT, COPD, history of atrial fibrillation, history of lymphedema, right lower extremity cellulitis, and obesity, who Presents for follow-up consultation of his obstructive sleep apnea.  Recent home sleep testing and starting AutoPap therapy.  Patient is unaccompanied today.  I first met him at the request of his primary care physician, on 03/11/2020, at which time the patient reported a prior diagnosis of obstructive sleep apnea and he was on CPAP therapy. He was not fully compliant with treatment at that time and needed new equipment and new supplies. He was eligible for new equipment.  He was advised to proceed with sleep testing, he had a home sleep test on 04/14/2020 which indicated moderate obstructive sleep apnea with an AHI of 26.6/h, O2 nadir of 84%.  He was advised to proceed with home AutoPap therapy.  His set up date with the new equipment was 05/27/2020.   Today, 07/29/2020: I reviewed his AutoPap compliance data from 06/28/2020 through 07/27/2020, at which time he used his AutoPap every night with percent use days greater than 4 hours at 100%, indicating superb compliance with an average usage of 5 hours and 42 minutes, residual AHI at goal at 0.7/h, average pressure for the 95th percentile at 11.8 cm with a range of 7 to 14 cm, leak acceptable with a 95th percentile at 10.7  L/min.  He reports doing well, he has adjusted well to AutoPap therapy and is fully compliant with it.  He feels improved, particularly with regards to his daytime energy and daytime somnolence.  He is quite pleased with how he is doing.  He is using a nasal mask, similar to his previous mask style.  He is up-to-date with his supplies and very motivated to continue with treatment.  He has been sleeping an average of 6 hours, sometimes as long as 7 hours.    REVIEW OF SYSTEMS: Out of a complete 14 system review of symptoms, the patient complains only of the following symptoms, and all other reviewed systems are negative.  Sleep apnea  ALLERGIES: Allergies  Allergen Reactions   Iohexol Hives, Itching and Other (See Comments)    Patient needs 13 hour prep per Dr. Pascal Lux, after cath he broke out in hives and had itching     HOME MEDICATIONS: Outpatient Medications Prior to Visit  Medication Sig Dispense Refill   aspirin (ASPIR-81) 81 MG EC tablet Take 1 tablet (81 mg total) by mouth daily. Swallow whole. 30 tablet 12   EPINEPHrine 0.3 mg/0.3 mL IJ SOAJ injection as needed.     furosemide (LASIX) 40 MG tablet TAKE 1 TABLET BY MOUTH TWICE WEEKLY ON MONDAYS AND FRIDAYS; MAKE DOCTOR'S APPOINTMENT ! 24 tablet 2   rosuvastatin (CRESTOR) 20 MG tablet Take 20 mg by mouth daily.     warfarin (COUMADIN) 5 MG tablet TAKE 1 AND 1/2 TABLETS  TO 2 TABLETS BY MOUTH DAILY AS DIRECTED  BY COUMADIN CLINIC 160 tablet 0   No facility-administered medications prior to visit.    PAST MEDICAL HISTORY: Past Medical History:  Diagnosis Date   Acute diastolic congestive heart failure ([place]) 09/21/2015   takes Lasix daily   Acute renal failure (HCC)    ATN s/p cardiac arrest   Atrial fibrillation (HCC)    Chronic venous hypertension due to deep vein thrombosis 12/14/2006   1993.  Complicated by chronic left lower extremity edema and occasional recurrent left lower extremity cellulitis    COPD (chronic obstructive  pulmonary disease) (HCC)    DVT (deep venous thrombosis) ([place]) early 80's   left leg   HLD (hyperlipidemia)    Incidental lung nodule, less than or equal to 37m 11/10/2015   3 mm nodule right lung   Lymphedema early 80's   Mitral regurgitation 10/06/2012   Obstructive sleep apnea 12/14/2006   AHI 19.2/hr, desaturation to 75% on Polysomnography 02/26/2005.  Uses 11 cm H2O nocturnal nasal CPAP with good symptom control.    OSA on CPAP    S/P minimally invasive mitral valve replacement with metallic valve 137/08/6268  33 mm Carbomedics Optiform bileaflet mechanical prosthesis placed via right mini-thoracotomy approach    PAST SURGICAL HISTORY: Past Surgical History:  Procedure Laterality Date   A-FLUTTER ABLATION N/A 03/20/2018   Procedure: A-FLUTTER ABLATION;  Surgeon: TEvans Lance MD;  [facility];  Service: Cardiovascular;  Laterality: N/A;   CARDIAC CATHETERIZATION N/A 11/03/2015   Procedure: Right/Left Heart Cath and Coronary Angiography;  Surgeon: DLeonie Man MD;  [facility];  Service: Cardiovascular;  Laterality: N/A;   CARDIOVERSION N/A 12/23/2017   Procedure: CARDIOVERSION;  Surgeon: NAcie FredricksonPWonda Cheng MD;  [facility];  Service: Cardiovascular;  Laterality: N/A;   MITRAL VALVE REPLACEMENT Right 11/13/2015   Procedure: MINIMALLY INVASIVE MITRAL VALVE (MV) REPLACEMENT;  Surgeon: CRexene Alberts MD;  [place] [facility]  Service: Open Heart Surgery;  Laterality: Right;   MULTIPLE EXTRACTIONS WITH ALVEOLOPLASTY N/A 11/07/2015   Procedure: Extraction of tooth #'s 1,2,8,9,12,15,16,17, 32 with alveoloplasty and gross debridement of remaining teeth.;  Surgeon: RLenn Cal DDS;  [place] [facility]  Service: Oral Surgery;  Laterality: N/A;   TEE WITHOUT CARDIOVERSION N/A 10/16/2015   Procedure: TRANSESOPHAGEAL ECHOCARDIOGRAM (TEE);  Surgeon: TSkeet Latch MD;  [place] [facility]  Service: Cardiovascular;  Laterality: N/A;   TEE  WITHOUT CARDIOVERSION N/A 11/13/2015   Procedure: TRANSESOPHAGEAL ECHOCARDIOGRAM (TEE);  Surgeon: CRexene Alberts MD;  [place] [facility]  Service: Open Heart Surgery;  Laterality: N/A;   TEE WITHOUT CARDIOVERSION N/A 11/03/2018   Procedure: TRANSESOPHAGEAL ECHOCARDIOGRAM (TEE);  Surgeon: NDorothy Spark MD;  [facility];  Service: Cardiovascular;  Laterality: N/A;   TEE WITHOUT CARDIOVERSION N/A 12/13/2018   Procedure: TRANSESOPHAGEAL ECHOCARDIOGRAM (TEE);  Surgeon: BJolaine Artist MD;  [facility];  Service: Cardiovascular;  Laterality: N/A;    FAMILY HISTORY: Family History  Problem Relation Age of Onset   Cancer Mother        type unknown but had a colostomy bag   Other Father        bone disease   Diabetes Paternal Aunt     SOCIAL HISTORY: Social History   Socioeconomic History   Marital status: Legally Separated    Spouse name: Not on file   Number of children: 3   Years of education: Not on file   Highest education level: Not on file  Occupational  History   Not on file  Tobacco Use   Smoking status: Former    Packs/day: 0.10    Years: 9.00    Pack years: 0.90    Types: Cigarettes    Quit date: 09/05/2012    Years since quitting: 9.2   Smokeless tobacco: Never  Vaping Use   Vaping Use: Never used  Substance and Sexual Activity   Alcohol use: Yes    Alcohol/week: 0.0 standard drinks    Comment: 1 beer once a week or special occasions   Drug use: No   Sexual activity: Yes  Other Topics Concern   Not on file  Social History Narrative   Not on file   Social Determinants of Health   Financial Resource Strain: Not on file  Food Insecurity: Not on file  Transportation Needs: Not on file  Physical Activity: Not on file  Stress: Not on file  Social Connections: Not on file  Intimate Partner Violence: Not on file      PHYSICAL EXAM  There were no vitals filed for this visit. There is no height or weight on file to calculate  BMI.  Generalized: Well developed, in no acute distress   Neurological examination  Mentation: Alert oriented to time, place, history taking. Follows all commands speech and language fluent Cranial nerve II-XII: Pupils were equal round reactive to light. Extraocular movements were full, visual field were full on confrontational test. Facial sensation and strength were normal. Uvula tongue midline. Head turning and shoulder shrug  were normal and symmetric. Motor: The motor testing reveals 5 over 5 strength of all 4 extremities. Good symmetric motor tone is noted throughout.  Sensory: Sensory testing is intact to soft touch on all 4 extremities. No evidence of extinction is noted.  Coordination: Cerebellar testing reveals good finger-nose-finger and heel-to-shin bilaterally.  Gait and station: Gait is normal. Tandem gait is normal. Romberg is negative. No drift is seen.  Reflexes: Deep tendon reflexes are symmetric and normal bilaterally.   DIAGNOSTIC DATA (LABS, IMAGING, TESTING) - I reviewed patient records, labs, notes, testing and imaging myself where available.  Lab Results  Component Value Date   WBC 4.1 11/06/2018   HGB 12.6 (L) 11/06/2018   HCT 40.2 11/06/2018   MCV 88.0 11/06/2018   PLT 265 11/06/2018      Component Value Date/Time   NA 141 10/20/2020 1030   NA 140 03/13/2018 1055   K 4.5 10/20/2020 1030   CL 108 10/20/2020 1030   CO2 23 10/20/2020 1030   GLUCOSE 97 10/20/2020 1030   BUN 16 10/20/2020 1030   BUN 16 03/13/2018 1055   CREATININE 0.87 10/20/2020 1030   CREATININE 1.13 01/05/2016 1403   CALCIUM 9.1 10/20/2020 1030   PROT 8.7 (H) 11/01/2018 1551   ALBUMIN 4.0 11/01/2018 1551   AST 54 (H) 11/01/2018 1551   ALT 31 11/01/2018 1551   ALKPHOS 74 11/01/2018 1551   BILITOT 1.5 (H) 11/01/2018 1551   GFRNONAA >60 10/20/2020 1030   GFRAA >60 12/14/2018 1102   Lab Results  Component Value Date   CHOL 187 09/22/2015   HDL 60 09/22/2015   LDLCALC 116 (H)  09/22/2015   TRIG 54 09/22/2015   CHOLHDL 3.1 09/22/2015   Lab Results  Component Value Date   HGBA1C 5.7 (H) 11/11/2015   No results found for: VITAMINB12 No results found for: TSH    ASSESSMENT AND PLAN 56 y.o. year old male  has a past medical history of Acute  diastolic congestive heart failure ([place]) (09/21/2015), Acute renal failure ([employer]), Atrial fibrillation ([place]), Chronic venous hypertension due to deep vein thrombosis (12/14/2006), COPD (chronic obstructive pulmonary disease) ([place]), DVT (deep venous thrombosis) ([place]) (early 80's), HLD (hyperlipidemia), Incidental lung nodule, less than or equal to 4m (11/10/2015), Lymphedema (early 80's), Mitral regurgitation (10/06/2012), Obstructive sleep apnea (12/14/2006), OSA on CPAP, and S/P minimally invasive mitral valve replacement with metallic valve (144/83/0159. here with:  1.  OSA on CPAP -Download continues to show excellent compliance -Encouraged to continue nightly use greater than 4 hours -We will send an order over to DME for continued supplies -Follow-up in 1 year or sooner if needed      SEvangeline Dakin DNP 12/03/2021, 5:31 AM [facility] [address] [place] [phone]

## 2021-12-30 ENCOUNTER — Other Ambulatory Visit: Payer: Self-pay

## 2021-12-30 ENCOUNTER — Ambulatory Visit (HOSPITAL_COMMUNITY)
Admission: RE | Admit: 2021-12-30 | Discharge: 2021-12-30 | Disposition: A | Discharge status: Home / Self Care | Payer: Managed Care, Other (non HMO) | Source: Ambulatory Visit | Attending: Adult Health | Admitting: Adult Health

## 2021-12-30 DIAGNOSIS — J449 Chronic obstructive pulmonary disease, unspecified: Secondary | ICD-10-CM | POA: Insufficient documentation

## 2021-12-30 DIAGNOSIS — I5022 Chronic systolic (congestive) heart failure: Secondary | ICD-10-CM

## 2021-12-30 DIAGNOSIS — I4891 Unspecified atrial fibrillation: Secondary | ICD-10-CM | POA: Diagnosis not present

## 2021-12-30 LAB — ECHOCARDIOGRAM COMPLETE
Area-P 1/2: 1.8 cm2
MV VTI: 1.32 cm2
S' Lateral: 3.2 cm

## 2021-12-31 ENCOUNTER — Ambulatory Visit: Payer: Managed Care, Other (non HMO)

## 2021-12-31 ENCOUNTER — Other Ambulatory Visit: Payer: Self-pay | Admitting: Internal Medicine

## 2021-12-31 DIAGNOSIS — I4892 Unspecified atrial flutter: Secondary | ICD-10-CM | POA: Diagnosis not present

## 2021-12-31 DIAGNOSIS — Z954 Presence of other heart-valve replacement: Secondary | ICD-10-CM

## 2021-12-31 DIAGNOSIS — Z7901 Long term (current) use of anticoagulants: Secondary | ICD-10-CM | POA: Diagnosis not present

## 2021-12-31 LAB — POCT INR: INR: 2.2 (ref 2.0–3.0)

## 2021-12-31 NOTE — Patient Instructions (Signed)
TAKE 2 TABLETS TONIGHT and then increase to 1.5 tablets daily except 2 tablets on Tuesday and Thursday.  Repeat INR in 4 weeks.

## 2022-01-04 ENCOUNTER — Telehealth: Payer: Self-pay

## 2022-01-04 ENCOUNTER — Encounter: Payer: Self-pay | Admitting: Gastroenterology

## 2022-01-04 ENCOUNTER — Ambulatory Visit: Payer: Managed Care, Other (non HMO) | Admitting: Gastroenterology

## 2022-01-04 VITALS — BP 132/72 | HR 60 | Ht 67.0 in | Wt 233.0 lb

## 2022-01-04 DIAGNOSIS — Z7901 Long term (current) use of anticoagulants: Secondary | ICD-10-CM

## 2022-01-04 DIAGNOSIS — Z952 Presence of prosthetic heart valve: Secondary | ICD-10-CM | POA: Diagnosis not present

## 2022-01-04 DIAGNOSIS — Z1211 Encounter for screening for malignant neoplasm of colon: Secondary | ICD-10-CM | POA: Diagnosis not present

## 2022-01-04 MED ORDER — SUTAB 1479-225-188 MG PO TABS: 1.0000 | ORAL_TABLET | Freq: Once | ORAL | 0 refills | Status: AC

## 2022-01-04 NOTE — Telephone Encounter (Signed)
Pharmacy, can you please comment on how long Coumadin can be held for upcoming procedure?  Thank you! 

## 2022-01-04 NOTE — Patient Instructions (Addendum)
If you are age 56 or older, your body mass index should be between 23-30. Your Body mass index is 36.49 kg/m. If this is out of the aforementioned range listed, please consider follow up with your Primary Care Provider.  If you are age 29 or younger, your body mass index should be between 19-25. Your Body mass index is 36.49 kg/m. If this is out of the aformentioned range listed, please consider follow up with your Primary Care Provider.   ________________________________________________________  The [facility] providers would like to encourage you to use [facility] to communicate with providers for non-urgent requests or questions.  Due to long hold times on the telephone, sending your provider a message by [facility] may be a faster and more efficient way to get a response.  Please allow 48 business hours for a response.  Please remember that this is for non-urgent requests.  _______________________________________________________  Dennis Bast have been scheduled for a colonoscopy. Please follow written instructions given to you at your visit today.  Please pick up your prep supplies at the pharmacy within the next 1-3 days. If you use inhalers (even only as needed), please bring them with you on the day of your procedure.  You will be contacted by our office prior to your procedure for directions on holding your COUMADIN.  If you do not hear from our office 1 week prior to your scheduled procedure, please call [phone] to discuss.    Marland Kitchenarm

## 2022-01-04 NOTE — Progress Notes (Signed)
HPI :  56 year old male with a history of mechanical mitral valve on Coumadin, a flutter, history of DVT, history of CHF, referred here by Dr. Marton Redwood to discuss colon cancer screening.Marland Kitchen  He was seen here in September 2019 to discuss this but ended up never having a colonoscopy done.  He denies any problems with his bowel habits in general.  States he is regular.  Denies any blood in his stools.  No abdominal pains.  He is eating well.  No GERD, no dysphagia.  When asked about his family history, he states his mother may have had colon cancer but unsure, she had a colostomy bag but was never confirmed she had colon cancer or not.  He has OSA and wears a CPAP for that.  He denies any cardiopulmonary symptoms that bother him at this time.  States his valve has been doing well.  Denies any shortness of breath or chest pains.  He just had an echocardiogram last week and that result is pending.  His EF has historically been in the 50s.  He denies any further cardiopulmonary testing from cardiology standpoint.  He denies any problems with anesthesia in the past.   Labs: 07/03/21: Hgb 14.2, MCV 91.0, plt 169  Echo 12/30/21 - pending result   Echo 02/20/18 - EF 55%, mechanical mitral valve  Past Medical History:  Diagnosis Date   Acute diastolic congestive heart failure ([place]) 09/21/2015   takes Lasix daily   Acute renal failure (HCC)    ATN s/p cardiac arrest   Atrial fibrillation (HCC)    Bee sting allergy    CAD (coronary artery disease)    cath 10-2015   Chronic venous hypertension due to deep vein thrombosis 12/14/2006   1993.  Complicated by chronic left lower extremity edema and occasional recurrent left lower extremity cellulitis    COPD (chronic obstructive pulmonary disease) (HCC)    DVT (deep venous thrombosis) ([place]) early 80's   left leg   HLD (hyperlipidemia)    Incidental lung nodule, less than or equal to 73mm 11/10/2015   3 mm nodule right lung   Lymphedema early 80's    Mitral regurgitation 10/06/2012   Obstructive sleep apnea 12/14/2006   AHI 19.2/hr, desaturation to 75% on Polysomnography 02/26/2005.  Uses 11 cm H2O nocturnal nasal CPAP with good symptom control.    Old myocardial infarction    OSA on CPAP    S/P minimally invasive mitral valve replacement with metallic valve 58/83/2549   33 mm Carbomedics Optiform bileaflet mechanical prosthesis placed via right mini-thoracotomy approach     Past Surgical History:  Procedure Laterality Date   A-FLUTTER ABLATION N/A 03/20/2018   Procedure: A-FLUTTER ABLATION;  Surgeon: Evans Lance, MD;  Location: [place] [facility];  Service: Cardiovascular;  Laterality: N/A;   CARDIAC CATHETERIZATION N/A 11/03/2015   Procedure: Right/Left Heart Cath and Coronary Angiography;  Surgeon: Leonie Man, MD;  Location: [place] [facility];  Service: Cardiovascular;  Laterality: N/A;   CARDIOVERSION N/A 12/23/2017   Procedure: CARDIOVERSION;  Surgeon: Acie Fredrickson Wonda Cheng, MD;  [facility];  Service: Cardiovascular;  Laterality: N/A;   MITRAL VALVE REPLACEMENT Right 11/13/2015   Procedure: MINIMALLY INVASIVE MITRAL VALVE (MV) REPLACEMENT;  Surgeon: [name], MD;  Location: [place];  Service: Open Heart Surgery;  Laterality: Right;   MULTIPLE EXTRACTIONS WITH ALVEOLOPLASTY N/A 11/07/2015   Procedure: Extraction of tooth #'s 1,2,8,9,12,15,16,17, 32 with alveoloplasty and gross debridement of remaining teeth.;  Surgeon:  [name], DDS;  Location: [place];  Service: Oral Surgery;  Laterality: N/A;   TEE WITHOUT CARDIOVERSION N/A 10/16/2015   Procedure: TRANSESOPHAGEAL ECHOCARDIOGRAM (TEE);  Surgeon: Skeet Latch, MD;  Location: [place];  Service: Cardiovascular;  Laterality: N/A;   TEE WITHOUT CARDIOVERSION N/A 11/13/2015   Procedure: TRANSESOPHAGEAL ECHOCARDIOGRAM (TEE);  Surgeon: [name], MD;  Location: [place];  Service: Open Heart Surgery;  Laterality: N/A;   TEE WITHOUT CARDIOVERSION N/A  11/03/2018   Procedure: TRANSESOPHAGEAL ECHOCARDIOGRAM (TEE);  Surgeon: Dorothy Spark, MD;  [facility];  Service: Cardiovascular;  Laterality: N/A;   TEE WITHOUT CARDIOVERSION N/A 12/13/2018   Procedure: TRANSESOPHAGEAL ECHOCARDIOGRAM (TEE);  Surgeon: Jolaine Artist, MD;  [facility];  Service: Cardiovascular;  Laterality: N/A;   Family History  Problem Relation Age of Onset   Cancer Mother        type unknown but had a colostomy bag   COPD Mother    Hypertension Mother    Other Father        bone disease   Diabetes Father    Hypertension Father    Diabetes Paternal Aunt    Social History   Tobacco Use   Smoking status: Former    Packs/day: 0.10    Years: 9.00    Pack years: 0.90    Types: Cigarettes    Quit date: 09/05/2012    Years since quitting: 9.3   Smokeless tobacco: Never  Vaping Use   Vaping Use: Never used  Substance Use Topics   Alcohol use: Yes    Alcohol/week: 0.0 standard drinks    Comment: 1 beer once a week or special occasions   Drug use: No   Current Outpatient Medications  Medication Sig Dispense Refill   aspirin (ASPIR-81) 81 MG EC tablet Take 1 tablet (81 mg total) by mouth daily. Swallow whole. 30 tablet 12   EPINEPHrine 0.3 mg/0.3 mL IJ SOAJ injection as needed.     furosemide (LASIX) 40 MG tablet Take 40 mg by mouth. Monday and Friday     rosuvastatin (CRESTOR) 20 MG tablet Take 20 mg by mouth daily.     warfarin (COUMADIN) 5 MG tablet TAKE 1 AND 1/2 TABLETS  TO 2 TABLETS BY MOUTH DAILY AS DIRECTED BY COUMADIN CLINIC 160 tablet 0   No current facility-administered medications for this visit.   Allergies  Allergen Reactions   Iohexol Hives, Itching and Other (See Comments)    Patient needs 13 hour prep per Dr. Pascal Lux, after cath he broke out in hives and had itching      Review of Systems: All systems reviewed and negative except where noted in HPI.    Labs per HPI - labs done at PCP office  Physical  Exam: BP 132/72    Pulse 60    Ht 5\' 7"  (1.702 m)    Wt 233 lb (105.7 kg)    BMI 36.49 kg/m  Constitutional: Pleasant,well-developed, male in no acute distress. HEENT: Normocephalic and atraumatic. Conjunctivae are normal. No scleral icterus. Neck supple.  Cardiovascular: Normal rate, regular rhythm. Mechanical heart sounds Pulmonary/chest: Effort normal and breath sounds normal.  Abdominal: Soft, nondistended, nontender. there are no masses palpable. Extremities: no edema Lymphadenopathy: No cervical adenopathy noted. Neurological: Alert and oriented to person place and time. Skin: Skin is warm and dry. No rashes noted. Psychiatric: Normal mood and affect. Behavior is normal.   ASSESSMENT AND PLAN: 56 year old male here to establish care for the following:  Colon cancer screening Anticoagulated Mechanical heart valve  Patient is overdue for routine colon cancer screening, asymptomatic.  We discussed options for colon cancer screening with him to include stool based testing, virtual colonoscopy, optical colonoscopy.  His mother may have had colon cancer, he is not sure. Discussed risks / benefits and limitations of each modality of screening and that of anesthesia.  Recommending optical colonoscopy of all options and he is agreeable with this.  To proceed with this would need approval to hold his Coumadin for 5 days preprocedure, and suspect he will likely need a Lovenox bridge in light of his mechanical valve but defer to his cardiologist for formal recommendations on that.  He understands risks of the procedure and with holding his anticoagulation.  We will obtain his echocardiogram report prior to the exam to make sure okay.  All questions answered he agrees with the plan.  Jolly Mango, MD [facility]  CC: Ginger Organ., MD

## 2022-01-04 NOTE — Telephone Encounter (Signed)
[facility] Pre-operative Risk Assessment     Request for surgical clearance:     Endoscopy Procedure  What type of surgery is being performed?     COLONOSCOPY  When is this surgery scheduled?     02-08-22  What type of clearance is required ?   Pharmacy  Are there any medications that need to be held prior to surgery and how long? COUMADIN 5 DAYS  Practice name and name of physician performing surgery?      [facility] DR Havery Moros  What is your office phone and fax number?      Phone- [phone]  Fax- [phone] [place]  Anesthesia type (None, local, MAC, general) ?     MAC   PLEASE ADVISE IF PATIENT NEEDS A LOVENOX BRIDGE.    THANK YOU!

## 2022-01-05 ENCOUNTER — Other Ambulatory Visit: Payer: Self-pay

## 2022-01-05 NOTE — Telephone Encounter (Signed)
Patient with diagnosis of mechanical mitral valve replacement and aflutter on warfarin for anticoagulation.    Procedure: colonoscopy Date of procedure: 02/08/22  He has not had BMET or CBC checked in the last year which will need to be checked.  Per office protocol, patient can hold warfarin for 5 days prior to procedure. Patient will need bridging with Lovenox (enoxaparin) around procedure. This will be coordinate at [facility] where pt is followed. Need updated labs to determine renal function and correct Lovenox dosing.

## 2022-01-05 NOTE — Telephone Encounter (Signed)
° °  Name: Oscar Brown  DOB: Aug 01, 1966  MRN: [id number]   Primary Cardiologist: Dr. Haroldine Laws (Advanced HF clinic)  Chart reviewed as part of pre-operative protocol coverage. Patient was contacted 01/05/2022 in reference to pre-operative risk assessment for pending procedure as outlined below.  Oscar Brown was last seen 10/2021 by Darrick Grinder, NP. I reached out to patient for update on how he is doing. The patient affirms he has been doing well without any new cardiac symptoms. He had an echo done 12/30/21 but the result still says "in process" and he says he has not heard anything back about this yet.   He can likely be cleared for his colonoscopy as requested below with plan for Lovenox-Coumadin bridging, but need to ensure no findings seen on echo which would alter plan for holding his anticoagulation. I will route to Amy/Dr. Haroldine Laws to review the echo  - please route response to P CV DIV PREOP (the pre-op pool). Thank you.  [name], PA-C 01/05/2022, 10:52 AM

## 2022-01-05 NOTE — Progress Notes (Signed)
PATIENT: Oscar Brown DOB: 12/01/1965  REASON FOR VISIT: Follow up for OSA on CPAP HISTORY FROM: Patient PRIMARY NEUROLOGIST: Dr. [name]  HISTORY OF PRESENT ILLNESS: Today 01/06/22 Oscar Brown here today for follow-up with history of OSA on CPAP. HST May 2021, showed moderate OSA.  Is on AutoPap.  Review of CPAP download indicates overall excellent compliance, greater than 4 hours 30/30 days.  Apnea is well treated at 0.5. He loves his CPAP, not tired during the day, isn't falling asleep during the day. Using nasal mask. No problems with equipment.     HISTORY Copied Dr. Guadelupe Sabin Note 07/29/2020: Oscar Brown is a 56 year old right-handed gentleman with an underlying medical history of mitral regurgitation with status post mitral valve replacement in 2016, on Coumadin, hx of Vfib arrest, Hx of recurrent Aflutter, hyperlipidemia, history of DVT, COPD, history of atrial fibrillation, history of lymphedema, right lower extremity cellulitis, and obesity, who Presents for follow-up consultation of his obstructive sleep apnea.  Recent home sleep testing and starting AutoPap therapy.  Patient is unaccompanied today.  I first met him at the request of his primary care physician, on 03/11/2020, at which time the patient reported a prior diagnosis of obstructive sleep apnea and he was on CPAP therapy. He was not fully compliant with treatment at that time and needed new equipment and new supplies. He was eligible for new equipment.  He was advised to proceed with sleep testing, he had a home sleep test on 04/14/2020 which indicated moderate obstructive sleep apnea with an AHI of 26.6/h, O2 nadir of 84%.  He was advised to proceed with home AutoPap therapy.  His set up date with the new equipment was 05/27/2020.   Today, 07/29/2020: I reviewed his AutoPap compliance data from 06/28/2020 through 07/27/2020, at which time he used his AutoPap every night with percent use days greater than 4 hours at 100%,  indicating superb compliance with an average usage of 5 hours and 42 minutes, residual AHI at goal at 0.7/h, average pressure for the 95th percentile at 11.8 cm with a range of 7 to 14 cm, leak acceptable with a 95th percentile at 10.7 L/min.  He reports doing well, he has adjusted well to AutoPap therapy and is fully compliant with it.  He feels improved, particularly with regards to his daytime energy and daytime somnolence.  He is quite pleased with how he is doing.  He is using a nasal mask, similar to his previous mask style.  He is up-to-date with his supplies and very motivated to continue with treatment.  He has been sleeping an average of 6 hours, sometimes as long as 7 hours.    The patient's allergies, current medications, family history, past medical history, past social history, past surgical history and problem list were reviewed and updated as appropriate.    Previously:    03/11/20: (He) was previously diagnosed with obstructive sleep apnea and placed on CPAP therapy.  Prior sleep study results are not available for my review today.  I reviewed your office records, phone note from 02/25/2020 as well as telemedicine note from 06/21/2019.  His Epworth sleepiness score is 0/24. A CPAP compliance download was reviewed today, from 02/10/2020 through 03/10/2020, which is a total of 30 days, during which time he used his machine 25 days with percent used days greater than 4 hours at 20% only, indicating significantly suboptimal compliance with an average usage of 2 hours and 51 minutes, residual AHI 0.7/h, leak very high  with a 95th percentile at 54.2 L/min on a pressure of 11 cm.  His DME company is [employer].  He has not received supplies in quite some time, uses a wide Mirage Fx nasal mask.  He reports that he is trying to keep his CPAP on all night but it is uncomfortable as the headgear is loose and he has trouble keeping the mask on.  He has benefited from treatment and would like to get back on  the machine with new supplies and an updated machine as soon as possible.  He has a variable sleep schedule given that he has 2 jobs.  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  Generally, he may be in bed between 1230 and 1 AM, rise time is generally around 7 AM.  He does have to go to the bathroom at least once per average night.  He lives with his 56 year old son who is in college.  He has 2 older daughters.  He is separated from his second wife.  Prior sleep study testing was over 10 years ago at [facility] he recalls.  He has an S9 machine from [employer]	[place].  His weight fluctuates a little bit, less in the summertime, a little more in the wintertime.  He does not drink any caffeine and tries to hydrate well with water.  He quit smoking some 5, nearly 6 years ago and drinks alcohol very occasionally.  He has a family history of sleep apnea, one of his daughters has a CPAP machine, 2 sisters have sleep apnea and his younger brother is in the process of being evaluated for sleep apnea as I understand.  REVIEW OF SYSTEMS: Out of a complete 14 system review of symptoms, the patient complains only of the following symptoms, and all other reviewed systems are negative.  See HPI  ALLERGIES: Allergies  Allergen Reactions   Iohexol Hives, Itching and Other (See Comments)    Patient needs 13 hour prep per Dr. Pascal Lux, after cath he broke out in hives and had itching     HOME MEDICATIONS: Outpatient Medications Prior to Visit  Medication Sig Dispense Refill   aspirin (ASPIR-81) 81 MG EC tablet Take 1 tablet (81 mg total) by mouth daily. Swallow whole. 30 tablet 12   EPINEPHrine 0.3 mg/0.3 mL IJ SOAJ injection as needed.     furosemide (LASIX) 40 MG tablet Take 1 tablet (40 mg total) by mouth 2 (two) times a week. Monday and Friday 24 tablet 3   furosemide (LASIX) 40 MG tablet Take 40 mg by mouth. Monday and Friday     rosuvastatin (CRESTOR) 20 MG tablet Take 20 mg by  mouth daily.     warfarin (COUMADIN) 5 MG tablet TAKE 1 AND 1/2 TABLETS  TO 2 TABLETS BY MOUTH DAILY AS DIRECTED BY COUMADIN CLINIC 160 tablet 0   No facility-administered medications prior to visit.    PAST MEDICAL HISTORY: Past Medical History:  Diagnosis Date   Acute diastolic congestive heart failure ([place]) 09/21/2015   takes Lasix daily   Acute renal failure (H[facility])    ATN s/p cardiac arrest   Atrial fibrillation ([facility])    Bee sting allergy    CAD (coronary artery disease)    cath 10-2015   Chronic venous hypertension due to deep vein thrombosis 12/14/2006   1993.  Complicated by chronic left lower extremity edema and occasional recurrent left lower extremity cellulitis    COPD (chronic obstructive pulmonary  disease) ([place])    DVT (deep venous thrombosis) ([place]) early 80's   left leg   HLD (hyperlipidemia)    Incidental lung nodule, less than or equal to 52m 11/10/2015   3 mm nodule right lung   Lymphedema early 80's   Mitral regurgitation 10/06/2012   Obstructive sleep apnea 12/14/2006   AHI 19.2/hr, desaturation to 75% on Polysomnography 02/26/2005.  Uses 11 cm H2O nocturnal nasal CPAP with good symptom control.    Old myocardial infarction    OSA on CPAP    S/P minimally invasive mitral valve replacement with metallic valve 171/04/2693  33 mm Carbomedics Optiform bileaflet mechanical prosthesis placed via right mini-thoracotomy approach    PAST SURGICAL HISTORY: Past Surgical History:  Procedure Laterality Date   A-FLUTTER ABLATION N/A 03/20/2018   Procedure: A-FLUTTER ABLATION;  Surgeon: TEvans Lance MD;  [facility];  Service: Cardiovascular;  Laterality: N/A;   CARDIAC CATHETERIZATION N/A 11/03/2015   Procedure: Right/Left Heart Cath and Coronary Angiography;  Surgeon: DLeonie Man MD;  [place] [facility];  Service: Cardiovascular;  Laterality: N/A;   CARDIOVERSION N/A 12/23/2017   Procedure: CARDIOVERSION;  Surgeon: NAcie FredricksonPWonda Cheng MD;   [facility];  Service: Cardiovascular;  Laterality: N/A;   MITRAL VALVE REPLACEMENT Right 11/13/2015   Procedure: MINIMALLY INVASIVE MITRAL VALVE (MV) REPLACEMENT;  Surgeon: CRexene Alberts MD;  [place] [facility]  Service: Open Heart Surgery;  Laterality: Right;   MULTIPLE EXTRACTIONS WITH ALVEOLOPLASTY N/A 11/07/2015   Procedure: Extraction of tooth #'s 1,2,8,9,12,15,16,17, 32 with alveoloplasty and gross debridement of remaining teeth.;  Surgeon: RLenn Cal DDS;  [place] [facility]  Service: Oral Surgery;  Laterality: N/A;   TEE WITHOUT CARDIOVERSION N/A 10/16/2015   Procedure: TRANSESOPHAGEAL ECHOCARDIOGRAM (TEE);  Surgeon: TSkeet Latch MD;  [place] [facility]  Service: Cardiovascular;  Laterality: N/A;   TEE WITHOUT CARDIOVERSION N/A 11/13/2015   Procedure: TRANSESOPHAGEAL ECHOCARDIOGRAM (TEE);  Surgeon: CRexene Alberts MD;  [place] [facility]  Service: Open Heart Surgery;  Laterality: N/A;   TEE WITHOUT CARDIOVERSION N/A 11/03/2018   Procedure: TRANSESOPHAGEAL ECHOCARDIOGRAM (TEE);  Surgeon: NDorothy Spark MD;  [facility];  Service: Cardiovascular;  Laterality: N/A;   TEE WITHOUT CARDIOVERSION N/A 12/13/2018   Procedure: TRANSESOPHAGEAL ECHOCARDIOGRAM (TEE);  Surgeon: BJolaine Artist MD;  [facility];  Service: Cardiovascular;  Laterality: N/A;    FAMILY HISTORY: Family History  Problem Relation Age of Onset   Cancer Mother        type unknown but had a colostomy bag   COPD Mother    Hypertension Mother    Other Father        bone disease   Diabetes Father    Hypertension Father    Diabetes Paternal Aunt     SOCIAL HISTORY: Social History   Socioeconomic History   Marital status: Divorced    Spouse name: Not on file   Number of children: 3   Years of education: Not on file   Highest education level: Not on file  Occupational History   Not on file  Tobacco Use   Smoking status: Former    Packs/day: 0.10    Years:  9.00    Pack years: 0.90    Types: Cigarettes    Quit date: 09/05/2012    Years since quitting: 9.3   Smokeless tobacco: Never  Vaping Use   Vaping Use: Never used  Substance and Sexual Activity   Alcohol  use: Yes    Alcohol/week: 0.0 standard drinks    Comment: 1 beer once a week or special occasions   Drug use: No   Sexual activity: Yes  Other Topics Concern   Not on file  Social History Narrative   Not on file   Social Determinants of Health   Financial Resource Strain: Not on file  Food Insecurity: Not on file  Transportation Needs: Not on file  Physical Activity: Not on file  Stress: Not on file  Social Connections: Not on file  Intimate Partner Violence: Not on file   PHYSICAL EXAM  Vitals:   01/06/22 0818  BP: 131/82  Pulse: (!) 58  Weight: 228 lb (103.4 kg)  Height: 5' 7"  (1.702 m)   Body mass index is 35.71 kg/m.  Generalized: Well developed, in no acute distress   Neurological examination  Mentation: Alert oriented to time, place, history taking. Follows all commands speech and language fluent Cranial nerve II-XII: Pupils were equal round reactive to light. Extraocular movements were full, visual field were full on confrontational test. Facial sensation and strength were normal.  Head turning and shoulder shrug  were normal and symmetric. Motor: The motor testing reveals 5 over 5 strength of all 4 extremities. Good symmetric motor tone is noted throughout.  Sensory: Sensory testing is intact to soft touch on all 4 extremities. No evidence of extinction is noted.  Coordination: Cerebellar testing reveals good finger-nose-finger and heel-to-shin bilaterally.  Gait and station: Gait is normal.  Reflexes: Deep tendon reflexes are symmetric and normal bilaterally.   DIAGNOSTIC DATA (LABS, IMAGING, TESTING) - I reviewed patient records, labs, notes, testing and imaging myself where available.  Lab Results  Component Value Date   WBC 4.1 11/06/2018   HGB  12.6 (L) 11/06/2018   HCT 40.2 11/06/2018   MCV 88.0 11/06/2018   PLT 265 11/06/2018      Component Value Date/Time   NA 141 10/20/2020 1030   NA 140 03/13/2018 1055   K 4.5 10/20/2020 1030   CL 108 10/20/2020 1030   CO2 23 10/20/2020 1030   GLUCOSE 97 10/20/2020 1030   BUN 16 10/20/2020 1030   BUN 16 03/13/2018 1055   CREATININE 0.87 10/20/2020 1030   CREATININE 1.13 01/05/2016 1403   CALCIUM 9.1 10/20/2020 1030   PROT 8.7 (H) 11/01/2018 1551   ALBUMIN 4.0 11/01/2018 1551   AST 54 (H) 11/01/2018 1551   ALT 31 11/01/2018 1551   ALKPHOS 74 11/01/2018 1551   BILITOT 1.5 (H) 11/01/2018 1551   GFRNONAA >60 10/20/2020 1030   GFRAA >60 12/14/2018 1102   Lab Results  Component Value Date   CHOL 187 09/22/2015   HDL 60 09/22/2015   LDLCALC 116 (H) 09/22/2015   TRIG 54 09/22/2015   CHOLHDL 3.1 09/22/2015   Lab Results  Component Value Date   HGBA1C 5.7 (H) 11/11/2015   No results found for: VITAMINB12 No results found for: TSH  ASSESSMENT AND PLAN 56 y.o. year old male HST in May 2021 showed moderate OSA with an AHI of 26.6/h, his set up date with no appointment was June 2021 on AutoPap therapy  1.  OSA on CPAP -Vincente looks great and is doing wonderful on CPAP -Download shows excellent compliance, commended on this, encouraged to continue nightly use greater than 4 hours -Order for supplies will be sent to DME -Follow-up 1 year or sooner if needed   Butler Denmark, AGNP-C, DNP 01/06/2022, 8:34 AM [facility] [phone]  [address] [place], [place] [address] [phone]

## 2022-01-06 ENCOUNTER — Telehealth: Payer: Self-pay

## 2022-01-06 ENCOUNTER — Encounter: Payer: Self-pay | Admitting: Neurology

## 2022-01-06 ENCOUNTER — Ambulatory Visit: Payer: Managed Care, Other (non HMO) | Admitting: Neurology

## 2022-01-06 ENCOUNTER — Other Ambulatory Visit: Payer: Self-pay

## 2022-01-06 VITALS — BP 131/82 | HR 58 | Ht 67.0 in | Wt 228.0 lb

## 2022-01-06 DIAGNOSIS — T63441A Toxic effect of venom of bees, accidental (unintentional), initial encounter: Secondary | ICD-10-CM | POA: Insufficient documentation

## 2022-01-06 DIAGNOSIS — D6859 Other primary thrombophilia: Secondary | ICD-10-CM | POA: Insufficient documentation

## 2022-01-06 DIAGNOSIS — Z8679 Personal history of other diseases of the circulatory system: Secondary | ICD-10-CM | POA: Insufficient documentation

## 2022-01-06 DIAGNOSIS — Z9989 Dependence on other enabling machines and devices: Secondary | ICD-10-CM | POA: Diagnosis not present

## 2022-01-06 DIAGNOSIS — Z5181 Encounter for therapeutic drug level monitoring: Secondary | ICD-10-CM

## 2022-01-06 DIAGNOSIS — G4733 Obstructive sleep apnea (adult) (pediatric): Secondary | ICD-10-CM | POA: Diagnosis not present

## 2022-01-06 DIAGNOSIS — Z86718 Personal history of other venous thrombosis and embolism: Secondary | ICD-10-CM | POA: Insufficient documentation

## 2022-01-06 DIAGNOSIS — I4892 Unspecified atrial flutter: Secondary | ICD-10-CM

## 2022-01-06 LAB — CBC
Hematocrit: 42.5 % (ref 37.5–51.0)
Hemoglobin: 14.9 g/dL (ref 13.0–17.7)
MCH: 29.2 pg (ref 26.6–33.0)
MCHC: 35.1 g/dL (ref 31.5–35.7)
MCV: 83 fL (ref 79–97)
Platelets: 139 10*3/uL — ABNORMAL LOW (ref 150–450)
RBC: 5.11 x10E6/uL (ref 4.14–5.80)
RDW: 13 % (ref 11.6–15.4)
WBC: 2.3 10*3/uL — CL (ref 3.4–10.8)

## 2022-01-06 LAB — BASIC METABOLIC PANEL
BUN/Creatinine Ratio: 21 — ABNORMAL HIGH (ref 9–20)
BUN: 17 mg/dL (ref 6–24)
CO2: 23 mmol/L (ref 20–29)
Calcium: 9.1 mg/dL (ref 8.7–10.2)
Chloride: 101 mmol/L (ref 96–106)
Creatinine, Ser: 0.81 mg/dL (ref 0.76–1.27)
Glucose: 78 mg/dL (ref 70–99)
Potassium: 4.4 mmol/L (ref 3.5–5.2)
Sodium: 138 mmol/L (ref 134–144)
eGFR: 104 mL/min/{1.73_m2} (ref >59)

## 2022-01-06 NOTE — Telephone Encounter (Signed)
I spoke to patient and informed him that we need labs drawn prior to his upcoming Colonoscopy 3/13.  He verbalized understanding, orders were placed and released.

## 2022-01-07 NOTE — Progress Notes (Signed)
Community message sent to [employer]. 

## 2022-01-08 NOTE — Telephone Encounter (Signed)
Called and spoke to patient. He understands to hold his Coumadin starting on 3-8. He has been contacted by Cardiology to arrange for a Lovenox bridge. Did blood work for them this Wednesday and has an appointment scheduled for 3-2.

## 2022-01-11 NOTE — Telephone Encounter (Signed)
CrCl 118, Platelet count 139.  Patient has upcoming [facility] Coumadin clinic appointment, in notes to set lovenox bridge.

## 2022-01-11 NOTE — Telephone Encounter (Signed)
° °  Patient Name: Oscar Brown  DOB: 09-15-1966 MRN: [id number]  Primary Cardiologist: Glori Bickers, MD  Chart reviewed as part of pre-operative protocol coverage.   To summarize recommendations, per Dr. Haroldine Laws, patient is cleared for colonoscopy with plan for Lovenox-Coumadin bridging. Had updated labs 01/06/22 by Dr. Haroldine Laws. Will route back to pharm team to help ensure plan for bridging is in place. As below, GI team is already aware of clearance. Will defer to advanced HF team to follow-up the in-process echocardiogram (still listed as in-process from 2/1) - will route back to [facility] so she can f/u result.  Will remove from pre-op box. Please call with questions.  [name], PA-C 01/11/2022, 8:18 AM

## 2022-01-12 ENCOUNTER — Telehealth (HOSPITAL_COMMUNITY): Payer: Self-pay

## 2022-01-12 ENCOUNTER — Other Ambulatory Visit (HOSPITAL_COMMUNITY): Payer: Self-pay

## 2022-01-12 DIAGNOSIS — I5022 Chronic systolic (congestive) heart failure: Secondary | ICD-10-CM

## 2022-01-12 NOTE — Telephone Encounter (Signed)
-----   Message from Jolaine Artist, MD sent at 01/08/2022  8:58 PM EST ----- Please repeat CBC with manual diff

## 2022-01-12 NOTE — Telephone Encounter (Addendum)
Pt aware, agreeable, and verbalized understanding   ----- Message from Jolaine Artist, MD sent at 01/08/2022  8:58 PM EST ----- Please repeat CBC with manual diff

## 2022-01-13 ENCOUNTER — Other Ambulatory Visit: Payer: Self-pay | Admitting: Internal Medicine

## 2022-01-19 ENCOUNTER — Ambulatory Visit (HOSPITAL_COMMUNITY)
Admission: RE | Admit: 2022-01-19 | Discharge: 2022-01-19 | Disposition: A | Discharge status: Home / Self Care | Payer: Managed Care, Other (non HMO) | Source: Ambulatory Visit | Attending: Internal Medicine | Admitting: Internal Medicine

## 2022-01-19 ENCOUNTER — Encounter (HOSPITAL_COMMUNITY): Payer: Self-pay

## 2022-01-19 ENCOUNTER — Other Ambulatory Visit: Payer: Self-pay

## 2022-01-19 DIAGNOSIS — I5022 Chronic systolic (congestive) heart failure: Secondary | ICD-10-CM | POA: Insufficient documentation

## 2022-01-19 LAB — CBC WITH DIFFERENTIAL/PLATELET
Abs Immature Granulocytes: 0 10*3/uL (ref 0.00–0.07)
Basophils Absolute: 0 10*3/uL (ref 0.0–0.1)
Basophils Relative: 1 %
Eosinophils Absolute: 0.2 10*3/uL (ref 0.0–0.5)
Eosinophils Relative: 4 %
HCT: 45.9 % (ref 39.0–52.0)
Hemoglobin: 15 g/dL (ref 13.0–17.0)
Immature Granulocytes: 0 %
Lymphocytes Relative: 35 %
Lymphs Abs: 1.4 10*3/uL (ref 0.7–4.0)
MCH: 28.9 pg (ref 26.0–34.0)
MCHC: 32.7 g/dL (ref 30.0–36.0)
MCV: 88.4 fL (ref 80.0–100.0)
Monocytes Absolute: 0.3 10*3/uL (ref 0.1–1.0)
Monocytes Relative: 9 %
Neutro Abs: 2.1 10*3/uL (ref 1.7–7.7)
Neutrophils Relative %: 51 %
Platelets: 186 10*3/uL (ref 150–400)
RBC: 5.19 MIL/uL (ref 4.22–5.81)
RDW: 13.7 % (ref 11.5–15.5)
WBC: 4 10*3/uL (ref 4.0–10.5)
nRBC: 0 % (ref 0.0–0.2)

## 2022-01-27 ENCOUNTER — Other Ambulatory Visit: Payer: Self-pay

## 2022-01-27 MED ORDER — ENOXAPARIN SODIUM 100 MG/ML IJ SOSY: 100.0000 mg | PREFILLED_SYRINGE | Freq: Two times a day (BID) | INTRAMUSCULAR | 1 refills | Status: DC

## 2022-01-28 ENCOUNTER — Other Ambulatory Visit: Payer: Self-pay

## 2022-01-28 ENCOUNTER — Ambulatory Visit: Payer: Managed Care, Other (non HMO)

## 2022-01-28 DIAGNOSIS — I4892 Unspecified atrial flutter: Secondary | ICD-10-CM | POA: Diagnosis not present

## 2022-01-28 DIAGNOSIS — Z7901 Long term (current) use of anticoagulants: Secondary | ICD-10-CM | POA: Diagnosis not present

## 2022-01-28 DIAGNOSIS — Z954 Presence of other heart-valve replacement: Secondary | ICD-10-CM | POA: Diagnosis not present

## 2022-01-28 LAB — POCT INR: INR: 1.8 — AB (ref 2.0–3.0)

## 2022-01-28 NOTE — Patient Instructions (Signed)
TAKE 3 TABLETS TONIGHT and then continue 1.5 tablets daily except 2 tablets on Tuesday and Thursday.  Repeat INR in 3 weeks. ? ?3/7: Last dose of warfarin. ? ?3/8: No warfarin or enoxaparin (Lovenox). ? ?3/9: Inject enoxaparin 100 mg in the fatty abdominal tissue at least 2 inches from the belly button twice a day about 12 hours apart, 8am and 8pm rotate sites. No warfarin. ? ?3/10: Inject enoxaparin in the fatty tissue every 12 hours, 8am and 8pm. No warfarin. ? ?3/11: Inject enoxaparin in the fatty tissue every 12 hours, 8am and 8pm. No warfarin. ? ?3/12: Inject enoxaparin in the fatty tissue in the morning at 8 am (No PM dose). No warfarin. ? ?3/13: Procedure Day - No enoxaparin - Resume warfarin in the evening or as directed by doctor (take an extra half tablet with usual dose for 2 days then resume normal dose). ? ?3/14: Resume enoxaparin inject in the fatty tissue every 12 hours and take warfarin ? ?3/15: Inject enoxaparin in the fatty tissue every 12 hours and take warfarin ? ?3/16: Inject enoxaparin in the fatty tissue every 12 hours and take warfarin ? ?3/17: Inject enoxaparin in the fatty tissue every 12 hours and take warfarin ? ?3/18: Inject enoxaparin in the fatty tissue every 12 hours and take warfarin ? ?3/19: Inject enoxaparin in the fatty tissue every 12 hours and take warfarin ? ?3/20: warfarin appt to check INR. ? ?

## 2022-02-04 ENCOUNTER — Encounter: Payer: Self-pay | Admitting: Gastroenterology

## 2022-02-08 ENCOUNTER — Encounter: Payer: Self-pay | Admitting: Gastroenterology

## 2022-02-08 ENCOUNTER — Ambulatory Visit (AMBULATORY_SURGERY_CENTER): Payer: Managed Care, Other (non HMO) | Admitting: Gastroenterology

## 2022-02-08 VITALS — BP 123/70 | HR 47 | Temp 96.2°F | Resp 14 | Ht 67.0 in | Wt 233.0 lb

## 2022-02-08 DIAGNOSIS — Z1211 Encounter for screening for malignant neoplasm of colon: Secondary | ICD-10-CM

## 2022-02-08 DIAGNOSIS — D122 Benign neoplasm of ascending colon: Secondary | ICD-10-CM

## 2022-02-08 DIAGNOSIS — D123 Benign neoplasm of transverse colon: Secondary | ICD-10-CM

## 2022-02-08 DIAGNOSIS — D125 Benign neoplasm of sigmoid colon: Secondary | ICD-10-CM

## 2022-02-08 MED ORDER — SODIUM CHLORIDE 0.9 % IV SOLN: 500.0000 mL | Freq: Once | INTRAVENOUS | Status: DC

## 2022-02-08 NOTE — Progress Notes (Signed)
[facility] History and Physical ? ? ?Primary Care Physician:  Ginger Organ., MD ? ? ?Reason for Procedure:   Colon cancer screening ? ?Plan:    colonoscopy ? ? ? ? ?HPI: Oscar Brown is a 56 y.o. male  here for colonoscopy screening - first time exam. Patient denies any bowel symptoms at this time. No family history of colon cancer known. Otherwise feels well without any cardiopulmonary symptoms. On lovenox bridge for history of mechanical mitral valve, held coumadin for 5 days, last dose of lovenox yesterday. Have discussed risks / benefits and he agrees, further recommendations pending results. ? ? ?Past Medical History:  ?Diagnosis Date  ? Acute diastolic congestive heart failure ([place]) 09/21/2015  ? takes Lasix daily  ? Acute renal failure ([place])   ? ATN s/p cardiac arrest  ? Atrial fibrillation ([place])   ? Bee sting allergy   ? CAD (coronary artery disease)   ? cath 10-2015  ? Chronic venous hypertension due to deep vein thrombosis 12/14/2006  ? 1993.  Complicated by chronic left lower extremity edema and occasional recurrent left lower extremity cellulitis   ? COPD (chronic obstructive pulmonary disease) ([place])   ? DVT (deep venous thrombosis) ([place]) early 80's  ? left leg  ? HLD (hyperlipidemia)   ? Incidental lung nodule, less than or equal to 15m 11/10/2015  ? 3 mm nodule right lung  ? Lymphedema early 823's ? Mitral regurgitation 10/06/2012  ? Obstructive sleep apnea 12/14/2006  ? AHI 19.2/hr, desaturation to 75% on Polysomnography 02/26/2005.  Uses 11 cm H2O nocturnal nasal CPAP with good symptom control.   ? Old myocardial infarction   ? OSA on CPAP   ? S/P minimally invasive mitral valve replacement with metallic valve 119/62/2297 ? 33 mm Carbomedics Optiform bileaflet mechanical prosthesis placed via right mini-thoracotomy approach  ? ? ?Past Surgical History:  ?Procedure Laterality Date  ? A-FLUTTER ABLATION N/A 03/20/2018  ? Procedure: A-FLUTTER ABLATION;  Surgeon: TEvans Lance  MD;  [facility];  Service: Cardiovascular;  Laterality: N/A;  ? CARDIAC CATHETERIZATION N/A 11/03/2015  ? Procedure: Right/Left Heart Cath and Coronary Angiography;  Surgeon: DLeonie Man MD;  [facility];  Service: Cardiovascular;  Laterality: N/A;  ? CARDIOVERSION N/A 12/23/2017  ? Procedure: CARDIOVERSION;  Surgeon: NThayer Headings MD;  [facility]  Service: Cardiovascular;  Laterality: N/A;  ? MITRAL VALVE REPLACEMENT Right 11/13/2015  ? Procedure: MINIMALLY INVASIVE MITRAL VALVE (MV) REPLACEMENT;  Surgeon: CRexene Alberts MD;  Location: [facility]  Service: Open Heart Surgery;  Laterality: Right;  ? MULTIPLE EXTRACTIONS WITH ALVEOLOPLASTY N/A 11/07/2015  ? Procedure: Extraction of tooth #'s 1,2,8,9,12,15,16,17, 32 with alveoloplasty and gross debridement of remaining teeth.;  Surgeon: RLenn Cal DDS;  [facility]  Service: Oral Surgery;  Laterality: N/A;  ? TEE WITHOUT CARDIOVERSION N/A 10/16/2015  ? Procedure: TRANSESOPHAGEAL ECHOCARDIOGRAM (TEE);  Surgeon: TSkeet Latch MD;  [facility]  Service: Cardiovascular;  Laterality: N/A;  ? TEE WITHOUT CARDIOVERSION N/A 11/13/2015  ? Procedure: TRANSESOPHAGEAL ECHOCARDIOGRAM (TEE);  Surgeon: CRexene Alberts MD;  [facility]  Service: Open Heart Surgery;  Laterality: N/A;  ? TEE WITHOUT CARDIOVERSION N/A 11/03/2018  ? Procedure: TRANSESOPHAGEAL ECHOCARDIOGRAM (TEE);  Surgeon: NDorothy Spark MD;  [facility]  Service: Cardiovascular;  Laterality: N/A;  ? TEE WITHOUT CARDIOVERSION N/A 12/13/2018  ? Procedure: TRANSESOPHAGEAL ECHOCARDIOGRAM (TEE);  Surgeon: BJolaine Artist MD;  [facility];  Service: Cardiovascular;  Laterality: N/A;  ? ? ?Prior to Admission medications   ?Medication Sig Start Date End Date Taking? Authorizing Provider  ?aspirin (ASPIR-81) 81 MG EC tablet Take 1 tablet (81 mg total) by mouth daily. Swallow whole. 12/16/15  Yes Bensimhon, Shaune Pascal, MD  ?enoxaparin (LOVENOX) 100 MG/ML injection Inject 1 mL (100 mg total) into the skin every 12 (twelve) hours. 01/27/22  Yes Bensimhon, Shaune Pascal, MD  ?rosuvastatin (CRESTOR) 20 MG tablet Take 20 mg by mouth daily.   Yes [provider]  ?warfarin (COUMADIN) 5 MG tablet TAKE 1 AND 1/2 TO 2 TABLETS BY MOUTH DAILY AS DIRECTED BY COUMADIN CLINIC 01/13/22  Yes [place] Lance, MD  ?EPINEPHrine 0.3 mg/0.3 mL IJ SOAJ injection as needed. 07/15/21   [provider]  ?furosemide (LASIX) 40 MG tablet Take 1 tablet (40 mg total) by mouth 2 (two) times a week. Monday and Friday 01/04/22   Bensimhon, Shaune Pascal, MD  ?furosemide (LASIX) 40 MG tablet Take 40 mg by mouth. Monday and Friday    [provider]  ? ? ?Current Outpatient Medications  ?Medication Sig Dispense Refill  ? aspirin (ASPIR-81) 81 MG EC tablet Take 1 tablet (81 mg total) by mouth daily. Swallow whole. 30 tablet 12  ? enoxaparin (LOVENOX) 100 MG/ML injection Inject 1 mL (100 mg total) into the skin every 12 (twelve) hours. 20 mL 1  ? rosuvastatin (CRESTOR) 20 MG tablet Take 20 mg by mouth daily.    ? warfarin (COUMADIN) 5 MG tablet TAKE 1 AND 1/2 TO 2 TABLETS BY MOUTH DAILY AS DIRECTED BY COUMADIN CLINIC 160 tablet 0  ? EPINEPHrine 0.3 mg/0.3 mL IJ SOAJ injection as needed.    ? furosemide (LASIX) 40 MG tablet Take 1 tablet (40 mg total) by mouth 2 (two) times a week. Monday and Friday 24 tablet 3  ? furosemide (LASIX) 40 MG tablet Take 40 mg by mouth. Monday and Friday    ? ?Current Facility-Administered Medications  ?Medication Dose Route Frequency Provider Last Rate Last Admin  ? 0.9 %  sodium chloride infusion  500 mL Intravenous Once Bunny Kleist, Carlota Raspberry, MD      ? ? ?Allergies as of 02/08/2022 - Review Complete 02/08/2022  ?Allergen Reaction Noted  ? Iohexol Hives, Itching, and Other (See Comments) 11/06/2015  ? ? ?Family History  ?Problem Relation Age of Onset  ? Cancer Mother   ?     type unknown but had a colostomy bag  ? COPD  Mother   ? Hypertension Mother   ? Other Father   ?     bone disease  ? Diabetes Father   ? Hypertension Father   ? Diabetes Paternal Aunt   ? ? ?Social History  ? ?Socioeconomic History  ? Marital status: Divorced  ?  Spouse name: Not on file  ? Number of children: 3  ? Years of education: Not on file  ? Highest education level: Not on file  ?Occupational History  ? Not on file  ?Tobacco Use  ? Smoking status: Former  ?  Packs/day: 0.10  ?  Years: 9.00  ?  Pack years: 0.90  ?  Types: Cigarettes  ?  Quit date: 09/05/2012  ?  Years since quitting: 9.4  ? Smokeless tobacco: Never  ?Vaping Use  ? Vaping Use: Never used  ?Substance and Sexual Activity  ? Alcohol use: Yes  ?  Alcohol/week: 0.0 standard drinks  ?  Comment: 1 beer once a week or special occasions  ? Drug use: No  ? Sexual activity: Yes  ?Other Topics Concern  ? Not on file  ?Social History Narrative  ? Not on file  ? ?Social Determinants of Health  ? ?Financial Resource Strain: Not on file  ?Food Insecurity: Not on file  ?Transportation Needs: Not on file  ?Physical Activity: Not on file  ?Stress: Not on file  ?Social Connections: Not on file  ?Intimate Partner Violence: Not on file  ? ? ?Review of Systems: ?All other review of systems negative except as mentioned in the HPI. ? ?Physical Exam: ?Vital signs ?BP 130/83   Pulse (!) 55   Temp (!) 96.2 ?F (35.7 ?C)   Resp (!) 2   Ht '5\' 7"'$  (1.702 m)   Wt 233 lb (105.7 kg)   SpO2 99%   BMI 36.49 kg/m?  ? ?General:   Alert,  Well-developed, pleasant and cooperative in NAD ?Lungs:  Clear throughout to auscultation.   ?Heart:  Regular rate and rhythm. Mechanical heart sounds ?Abdomen:  Soft, nontender and nondistended.   ?Neuro/Psych:  Alert and cooperative. Normal mood and affect. A and O x 3 ? ?Jolly Mango, MD ?[facility] ? ? ?

## 2022-02-08 NOTE — Progress Notes (Signed)
PT taken to PACU. Monitors in place. VSS. Report given to RN. 

## 2022-02-08 NOTE — Progress Notes (Signed)
Called to room to assist during endoscopic procedure.  Patient ID and intended procedure confirmed with present staff. Received instructions for my participation in the procedure from the performing physician.  

## 2022-02-08 NOTE — Progress Notes (Signed)
Pt's states no medical or surgical changes since previsit or office visit. 

## 2022-02-08 NOTE — Op Note (Signed)
[facility] ?Patient Name: Oscar Brown ?Procedure Date: 02/08/2022 4:05 PM ?MRN: [id number] ?Endoscopist: Carlota Raspberry. Havery Moros , MD ?Age: 56 ?Referring MD:  ?Date of Birth: Sep 21, 1966 ?Gender: Male ?Account #: [id number] ?Procedure:                Colonoscopy ?Indications:              Screening for colorectal malignant neoplasm, This  ?                          is the patient's first colonoscopy ?Medicines:                Monitored Anesthesia Care ?Procedure:                Pre-Anesthesia Assessment: ?                          - Prior to the procedure, a History and Physical  ?                          was performed, and patient medications and  ?                          allergies were reviewed. The patient's tolerance of  ?                          previous anesthesia was also reviewed. The risks  ?                          and benefits of the procedure and the sedation  ?                          options and risks were discussed with the patient.  ?                          All questions were answered, and informed consent  ?                          was obtained. Prior Anticoagulants: The patient has  ?                          taken Coumadin (warfarin), last dose was 5 days  ?                          prior to procedure, last dose of Lovenox yesterday.  ?                          ASA Grade Assessment: III - A patient with severe  ?                          systemic disease. After reviewing the risks and  ?                          benefits, the patient was deemed in satisfactory  ?  condition to undergo the procedure. ?                          After obtaining informed consent, the colonoscope  ?                          was passed under direct vision. Throughout the  ?                          procedure, the patient's blood pressure, pulse, and  ?                          oxygen saturations were monitored continuously. The  ?                          Olympus CF-[id number]  (#[id number]) Colonoscope was  ?                          introduced through the anus and advanced to the the  ?                          cecum, identified by appendiceal orifice and  ?                          ileocecal valve. The colonoscopy was performed  ?                          without difficulty. The patient tolerated the  ?                          procedure well. The quality of the bowel  ?                          preparation was good. The ileocecal valve,  ?                          appendiceal orifice, and rectum were photographed. ?Scope In: 4:25:07 PM ?Scope Out: 4:48:53 PM ?Scope Withdrawal Time: 0 hours 19 minutes 34 seconds  ?Total Procedure Duration: 0 hours 23 minutes 46 seconds  ?Findings:                 The perianal and digital rectal examinations were  ?                          normal. ?                          A 3 mm polyp was found in the ascending colon. The  ?                          polyp was sessile. The polyp was removed with a  ?                          cold snare. Resection and retrieval were complete. ?  Two flat polyps were found in the hepatic flexure.  ?                          The polyps were 3 to 4 mm in size. These polyps  ?                          were removed with a cold snare. Resection and  ?                          retrieval were complete. ?                          A 5 mm polyp was found in the sigmoid colon. The  ?                          polyp was sessile. The polyp was removed with a  ?                          cold snare. Resection and retrieval were complete. ?                          Internal hemorrhoids were found during retroflexion. ?                          The exam was otherwise without abnormality. ?Complications:            No immediate complications. Estimated blood loss:  ?                          Minimal. ?Estimated Blood Loss:     Estimated blood loss was minimal. ?Impression:               - One 3 mm polyp in the  ascending colon, removed  ?                          with a cold snare. Resected and retrieved. ?                          - Two 3 to 4 mm polyps at the hepatic flexure,  ?                          removed with a cold snare. Resected and retrieved. ?                          - One 5 mm polyp in the sigmoid colon, removed with  ?                          a cold snare. Resected and retrieved. ?                          - Internal hemorrhoids. ?                          - The  examination was otherwise normal. ?Recommendation:           - Patient has a contact number available for  ?                          emergencies. The signs and symptoms of potential  ?                          delayed complications were discussed with the  ?                          patient. Return to normal activities tomorrow.  ?                          Written discharge instructions were provided to the  ?                          patient. ?                          - Resume previous diet. ?                          - Continue present medications. ?                          - Resume Coumadin tonight ?                          - Resume Lovenox tomorrow morning ?                          - Await pathology results. ?Carlota Raspberry. Havery Moros, MD ?02/08/2022 4:54:37 PM ?This report has been signed electronically. ?

## 2022-02-08 NOTE — Patient Instructions (Signed)
Impression/Recommendations: ? ?Polyp and hemorroid handouts given to patient. ? ?Resume previous diet. ?Continue present medications. ? ?Resume Coumadin tonight. ? ?Resume Lovenox tomorrow morning. ? ?Await pathology results. ? ?YOU HAD AN ENDOSCOPIC PROCEDURE TODAY AT [facility]:   Refer to the procedure report that was given to you for any specific questions about what was found during the examination.  If the procedure report does not answer your questions, please call your gastroenterologist to clarify.  If you requested that your care partner not be given the details of your procedure findings, then the procedure report has been included in a sealed envelope for you to review at your convenience later. ? ?YOU SHOULD EXPECT: Some feelings of bloating in the abdomen. Passage of more gas than usual.  Walking can help get rid of the air that was put into your GI tract during the procedure and reduce the bloating. If you had a lower endoscopy (such as a colonoscopy or flexible sigmoidoscopy) you may notice spotting of blood in your stool or on the toilet paper. If you underwent a bowel prep for your procedure, you may not have a normal bowel movement for a few days. ? ?Please Note:  You might notice some irritation and congestion in your nose or some drainage.  This is from the oxygen used during your procedure.  There is no need for concern and it should clear up in a day or so. ? ?SYMPTOMS TO REPORT IMMEDIATELY: ? ?Following lower endoscopy (colonoscopy or flexible sigmoidoscopy): ? Excessive amounts of blood in the stool ? Significant tenderness or worsening of abdominal pains ? Swelling of the abdomen that is new, acute ? Fever of 100?F or higher ?For urgent or emergent issues, a gastroenterologist can be reached at any hour by calling [phone]. ?Do not use MyChart messaging for urgent concerns.  ? ? ?DIET:  We do recommend a small meal at first, but then you may proceed to your regular  diet.  Drink plenty of fluids but you should avoid alcoholic beverages for 24 hours. ? ?ACTIVITY:  You should plan to take it easy for the rest of today and you should NOT DRIVE or use heavy machinery until tomorrow (because of the sedation medicines used during the test).   ? ?FOLLOW UP: ?Our staff will call the number listed on your records 48-72 hours following your procedure to check on you and address any questions or concerns that you may have regarding the information given to you following your procedure. If we do not reach you, we will leave a message.  We will attempt to reach you two times.  During this call, we will ask if you have developed any symptoms of COVID 19. If you develop any symptoms (ie: fever, flu-like symptoms, shortness of breath, cough etc.) before then, please call [phone].  If you test positive for Covid 19 in the 2 weeks post procedure, please call and report this information to [place].   ? ?If any biopsies were taken you will be contacted by phone or by letter within the next 1-3 weeks.  Please call us at [phone] if you have not heard about the biopsies in 3 weeks.  ? ? ?SIGNATURES/CONFIDENTIALITY: ?You and/or your care partner have signed paperwork which will be entered into your electronic medical record.  These signatures attest to the fact that that the information above on your After Visit Summary has been reviewed and is understood.  Full responsibility of the confidentiality of  this discharge information lies with you and/or your care-partner.  ?

## 2022-02-10 ENCOUNTER — Telehealth: Payer: Self-pay

## 2022-02-10 NOTE — Telephone Encounter (Signed)
?  Follow up Call- ? ?Call back number 02/08/2022  ?Post procedure Call Back phone  # [phone]  ?Permission to leave phone message Yes  ?Some recent data might be hidden  ?  ? ?Patient questions: ? ?Do you have a fever, pain , or abdominal swelling? No. ?Pain Score  0 * ? ?Have you tolerated food without any problems? Yes.   ? ?Have you been able to return to your normal activities? Yes.   ? ?Do you have any questions about your discharge instructions: ?Diet   No. ?Medications  No. ?Follow up visit  No. ? ?Do you have questions or concerns about your Care? No. ? ?Actions: ?* If pain score is 4 or above: ?No action needed, pain <4. ? ? ?Have you developed a fever since your procedure? no ? ?2.   Have you had an respiratory symptoms (SOB or cough) since your procedure? no ? ?3.   Have you tested positive for COVID 19 since your procedure no ? ?4.   Have you had any family members/close contacts diagnosed with the COVID 19 since your procedure?  no ? ? ?If yes to any of these questions please route to Joylene John, RN and Joella Prince, RN ? ? ? ?

## 2022-02-12 ENCOUNTER — Telehealth: Payer: Self-pay | Admitting: Gastroenterology

## 2022-02-12 NOTE — Telephone Encounter (Signed)
Returned call to patient. I informed him that we just received his pathology report yesterday and Dr. Havery Moros has not had a chance to review it yet. I told pt that we will be in contact with him regarding his results. He asked for a phone call with results since he does not have a smart phone to access my chart. Pt verbalized understanding and had no concerns at the end of the call. ?

## 2022-02-12 NOTE — Telephone Encounter (Signed)
Patient called requesting results from colon on 3/13. Please advise.  ?

## 2022-02-13 ENCOUNTER — Encounter: Payer: Self-pay | Admitting: Gastroenterology

## 2022-02-13 NOTE — Telephone Encounter (Signed)
I sent the patient a letter with details, but can you let him know 3 of 4 small polyps removed were adenomas, and based on national surveillance guidelines, recommend a repeat colonoscopy in 5 years. Nothing high risk noted. Thanks ?

## 2022-02-15 ENCOUNTER — Other Ambulatory Visit: Payer: Self-pay

## 2022-02-15 ENCOUNTER — Ambulatory Visit: Payer: Managed Care, Other (non HMO)

## 2022-02-15 DIAGNOSIS — Z954 Presence of other heart-valve replacement: Secondary | ICD-10-CM | POA: Diagnosis not present

## 2022-02-15 DIAGNOSIS — I4892 Unspecified atrial flutter: Secondary | ICD-10-CM | POA: Diagnosis not present

## 2022-02-15 DIAGNOSIS — Z7901 Long term (current) use of anticoagulants: Secondary | ICD-10-CM | POA: Diagnosis not present

## 2022-02-15 LAB — POCT INR: INR: 2 (ref 2.0–3.0)

## 2022-02-15 NOTE — Telephone Encounter (Signed)
Called and spoke with patient, we have reviewed his pathology results. Pt is aware that I will mail him a copy of his pathology letter. Pt had no questions about his results. He verbalized understanding and had no concerns at the end of the call.  ?

## 2022-02-15 NOTE — Patient Instructions (Signed)
TAKE 3 TABLETS TODAY ONLY and then continue 1.5 tablets daily except 2 tablets on Tuesday and Thursday.  Repeat INR in 2 weeks. ?

## 2022-03-01 ENCOUNTER — Ambulatory Visit: Payer: Managed Care, Other (non HMO)

## 2022-03-01 DIAGNOSIS — Z954 Presence of other heart-valve replacement: Secondary | ICD-10-CM | POA: Diagnosis not present

## 2022-03-01 DIAGNOSIS — I4892 Unspecified atrial flutter: Secondary | ICD-10-CM | POA: Diagnosis not present

## 2022-03-01 DIAGNOSIS — Z7901 Long term (current) use of anticoagulants: Secondary | ICD-10-CM

## 2022-03-01 LAB — POCT INR: INR: 2.4 (ref 2.0–3.0)

## 2022-03-01 NOTE — Patient Instructions (Signed)
TAKE 3 TABLETS TODAY ONLY and then continue 1.5 tablets daily except 2 tablets on Tuesday and Thursday.  Repeat INR in 3 weeks. ?

## 2022-03-22 ENCOUNTER — Ambulatory Visit: Payer: Managed Care, Other (non HMO)

## 2022-03-22 DIAGNOSIS — Z7901 Long term (current) use of anticoagulants: Secondary | ICD-10-CM

## 2022-03-22 DIAGNOSIS — I4892 Unspecified atrial flutter: Secondary | ICD-10-CM

## 2022-03-22 DIAGNOSIS — Z954 Presence of other heart-valve replacement: Secondary | ICD-10-CM | POA: Diagnosis not present

## 2022-03-22 LAB — POCT INR: INR: 3.6 — AB (ref 2.0–3.0)

## 2022-03-22 NOTE — Patient Instructions (Signed)
continue 1.5 tablets daily except 2 tablets on Tuesday and Thursday.  Repeat INR in 6 weeks. ?

## 2022-03-29 ENCOUNTER — Other Ambulatory Visit: Payer: Self-pay | Admitting: Internal Medicine

## 2022-03-29 NOTE — Telephone Encounter (Signed)
Refill request for warfarin: ? ?Last INR was 3.6 on 03/22/22 ?Next INR due 05/03/22 ?Last OV was 11/19/21 ? ?Refill approved ? ?

## 2022-05-03 ENCOUNTER — Ambulatory Visit (INDEPENDENT_AMBULATORY_CARE_PROVIDER_SITE_OTHER): Payer: Managed Care, Other (non HMO)

## 2022-05-03 DIAGNOSIS — I4892 Unspecified atrial flutter: Secondary | ICD-10-CM | POA: Diagnosis not present

## 2022-05-03 DIAGNOSIS — Z954 Presence of other heart-valve replacement: Secondary | ICD-10-CM

## 2022-05-03 DIAGNOSIS — Z7901 Long term (current) use of anticoagulants: Secondary | ICD-10-CM

## 2022-05-03 LAB — POCT INR: INR: 3.4 — AB (ref 2.0–3.0)

## 2022-05-03 NOTE — Patient Instructions (Signed)
continue 1.5 tablets daily except 2 tablets on Tuesday and Thursday.  Repeat INR in 6 weeks.

## 2022-06-15 ENCOUNTER — Ambulatory Visit: Payer: Managed Care, Other (non HMO)

## 2022-06-15 DIAGNOSIS — I4892 Unspecified atrial flutter: Secondary | ICD-10-CM

## 2022-06-15 DIAGNOSIS — Z7901 Long term (current) use of anticoagulants: Secondary | ICD-10-CM

## 2022-06-15 DIAGNOSIS — Z954 Presence of other heart-valve replacement: Secondary | ICD-10-CM | POA: Diagnosis not present

## 2022-06-15 LAB — POCT INR: INR: 2.1 (ref 2.0–3.0)

## 2022-06-15 NOTE — Patient Instructions (Signed)
TAKE 3 TABLETS TODAY ONLY and then continue 1.5 tablets daily except 2 tablets on Tuesday and Thursday.  Repeat INR in 6 weeks.

## 2022-07-27 ENCOUNTER — Ambulatory Visit: Payer: Managed Care, Other (non HMO) | Attending: Cardiovascular Disease | Admitting: *Deleted

## 2022-07-27 DIAGNOSIS — Z954 Presence of other heart-valve replacement: Secondary | ICD-10-CM

## 2022-07-27 DIAGNOSIS — I4892 Unspecified atrial flutter: Secondary | ICD-10-CM | POA: Diagnosis not present

## 2022-07-27 DIAGNOSIS — Z7901 Long term (current) use of anticoagulants: Secondary | ICD-10-CM | POA: Diagnosis not present

## 2022-07-27 LAB — POCT INR: INR: 3.6 — AB (ref 2.0–3.0)

## 2022-07-27 NOTE — Patient Instructions (Addendum)
Description   TAKE 1 TABLET TODAY ONLY and then continue 1.5 tablets daily except 2 tablets on Tuesday and Thursday.  Repeat INR in 6 weeks. [phone]

## 2022-09-08 ENCOUNTER — Ambulatory Visit: Payer: Managed Care, Other (non HMO) | Attending: Cardiology

## 2022-09-08 ENCOUNTER — Other Ambulatory Visit: Payer: Self-pay | Admitting: Pharmacist

## 2022-09-08 DIAGNOSIS — I4892 Unspecified atrial flutter: Secondary | ICD-10-CM | POA: Diagnosis not present

## 2022-09-08 DIAGNOSIS — Z954 Presence of other heart-valve replacement: Secondary | ICD-10-CM | POA: Diagnosis not present

## 2022-09-08 DIAGNOSIS — Z7901 Long term (current) use of anticoagulants: Secondary | ICD-10-CM | POA: Diagnosis not present

## 2022-09-08 DIAGNOSIS — Z952 Presence of prosthetic heart valve: Secondary | ICD-10-CM

## 2022-09-08 LAB — POCT INR: INR: 2.1 (ref 2.0–3.0)

## 2022-09-08 MED ORDER — WARFARIN SODIUM 5 MG PO TABS: ORAL_TABLET | ORAL | 1 refills | Status: DC

## 2022-09-08 NOTE — Telephone Encounter (Signed)
Last OV : Oscar Brown on 10/28/2021 (dr. Kendrick [place])  Last INR 2.1 (goal 2.5-3.5)

## 2022-09-08 NOTE — Patient Instructions (Signed)
Description   Take 2 tablets today, then continue 1.5 tablets daily except 2 tablets on Tuesday and Thursday.  Repeat INR in 4 weeks. [phone]

## 2022-10-06 ENCOUNTER — Ambulatory Visit: Payer: Managed Care, Other (non HMO) | Attending: Cardiology | Admitting: *Deleted

## 2022-10-06 DIAGNOSIS — Z954 Presence of other heart-valve replacement: Secondary | ICD-10-CM | POA: Diagnosis not present

## 2022-10-06 DIAGNOSIS — I4892 Unspecified atrial flutter: Secondary | ICD-10-CM | POA: Diagnosis not present

## 2022-10-06 DIAGNOSIS — Z7901 Long term (current) use of anticoagulants: Secondary | ICD-10-CM | POA: Diagnosis not present

## 2022-10-06 LAB — POCT INR: INR: 3.6 — AB (ref 2.0–3.0)

## 2022-10-06 NOTE — Patient Instructions (Signed)
Description   Today take 1 tablet then continue taking warfarin 1.5 tablets daily except 2 tablets on Tuesday and Thursday.  Repeat INR in 5 weeks. [phone]

## 2022-11-10 ENCOUNTER — Ambulatory Visit: Payer: Managed Care, Other (non HMO) | Attending: Cardiovascular Disease

## 2022-11-10 DIAGNOSIS — I4892 Unspecified atrial flutter: Secondary | ICD-10-CM | POA: Diagnosis not present

## 2022-11-10 DIAGNOSIS — Z7901 Long term (current) use of anticoagulants: Secondary | ICD-10-CM

## 2022-11-10 DIAGNOSIS — Z954 Presence of other heart-valve replacement: Secondary | ICD-10-CM | POA: Diagnosis not present

## 2022-11-10 LAB — POCT INR: INR: 2.1 (ref 2.0–3.0)

## 2022-11-10 NOTE — Patient Instructions (Signed)
Description   Today take 2 tablets then continue taking warfarin 1.5 tablets daily except 2 tablets on Tuesday and Thursday.  Repeat INR in 4 weeks.  [facility] [phone]

## 2022-11-30 ENCOUNTER — Other Ambulatory Visit: Payer: Self-pay | Admitting: Internal Medicine

## 2022-12-08 ENCOUNTER — Ambulatory Visit: Payer: Managed Care, Other (non HMO) | Attending: Cardiology

## 2022-12-08 DIAGNOSIS — Z954 Presence of other heart-valve replacement: Secondary | ICD-10-CM

## 2022-12-08 DIAGNOSIS — Z7901 Long term (current) use of anticoagulants: Secondary | ICD-10-CM

## 2022-12-08 DIAGNOSIS — I4892 Unspecified atrial flutter: Secondary | ICD-10-CM

## 2022-12-08 LAB — POCT INR: INR: 3.8 — AB (ref 2.0–3.0)

## 2022-12-08 NOTE — Patient Instructions (Signed)
Description   Only take 1/2 tablet today, only 1.5 tablets tomorrow, 1.5 tablets Fridays, and 1 tablet on Saturday.Then resume taking warfarin 1.5 tablets daily except 2 tablets on Tuesday and Thursday.  Repeat INR on Tuesday.  [facility] [phone]

## 2022-12-13 ENCOUNTER — Ambulatory Visit (HOSPITAL_COMMUNITY)
Admission: RE | Admit: 2022-12-13 | Discharge: 2022-12-13 | Disposition: A | Discharge status: Home / Self Care | Payer: Managed Care, Other (non HMO) | Source: Ambulatory Visit | Attending: Surgery | Admitting: Surgery

## 2022-12-13 ENCOUNTER — Other Ambulatory Visit (HOSPITAL_COMMUNITY): Payer: Self-pay | Admitting: Family Medicine

## 2022-12-13 DIAGNOSIS — I890 Lymphedema, not elsewhere classified: Secondary | ICD-10-CM | POA: Insufficient documentation

## 2022-12-14 ENCOUNTER — Other Ambulatory Visit: Payer: Self-pay | Admitting: Family Medicine

## 2022-12-14 ENCOUNTER — Ambulatory Visit: Payer: Managed Care, Other (non HMO) | Attending: Cardiology

## 2022-12-14 DIAGNOSIS — Z7901 Long term (current) use of anticoagulants: Secondary | ICD-10-CM

## 2022-12-14 DIAGNOSIS — Z954 Presence of other heart-valve replacement: Secondary | ICD-10-CM | POA: Diagnosis not present

## 2022-12-14 DIAGNOSIS — Z5181 Encounter for therapeutic drug level monitoring: Secondary | ICD-10-CM

## 2022-12-14 DIAGNOSIS — I4892 Unspecified atrial flutter: Secondary | ICD-10-CM

## 2022-12-14 DIAGNOSIS — R590 Localized enlarged lymph nodes: Secondary | ICD-10-CM

## 2022-12-14 LAB — POCT INR: INR: 1.6 — AB (ref 2.0–3.0)

## 2022-12-14 NOTE — Patient Instructions (Signed)
TAKE 3 TABLETS TODAY ONLY THEN CONTINUE warfarin 1.5 tablets daily except 2 tablets on Tuesday and Thursday.  INR in 2 weeks  [facility]in Clinic [phone]

## 2022-12-15 ENCOUNTER — Telehealth: Payer: Self-pay

## 2022-12-15 NOTE — Progress Notes (Deleted)
Phone call to patient to review instructions for 13 hr prep for CT Abd and Pelvis w/contrast on 12/16/2022 at 11:00AM. Prescription called into [facility]. Pt aware and verbalized understanding of instructions.  Prescription:  12/15/2022 10:00 PM - '50mg'$  Prednisone 12/16/2022 04:00 AM- '50mg'$  Prednisone 12/16/2022 10:00 AM- '50mg'$  Prednisone and '50mg'$  Benadryl  Patient will take home supply of Benadryl. Patient aware to have a driver.

## 2022-12-15 NOTE — Telephone Encounter (Signed)
Phone call to patient to review instructions for 13 hr prep for CT Abd and Pelvis w/contrast on 12/16/2022 at 11:00AM. Prescription called into [facility]. Pt aware and verbalized understanding of instructions.   Prescription:   12/15/2022 10:00 PM - '50mg'$  Prednisone 12/16/2022 04:00 AM- '50mg'$  Prednisone 12/16/2022 10:00 AM- '50mg'$  Prednisone and '50mg'$  Benadryl   Patient will take home supply of Benadryl. Patient aware to have a driver.

## 2022-12-16 ENCOUNTER — Ambulatory Visit
Admission: RE | Admit: 2022-12-16 | Discharge: 2022-12-16 | Disposition: A | Discharge status: Home / Self Care | Payer: Managed Care, Other (non HMO) | Source: Ambulatory Visit | Attending: Family Medicine | Admitting: Family Medicine

## 2022-12-16 DIAGNOSIS — R590 Localized enlarged lymph nodes: Secondary | ICD-10-CM

## 2022-12-16 MED ORDER — IOPAMIDOL (ISOVUE-300) INJECTION 61%
100.0000 mL | Freq: Once | INTRAVENOUS | Status: AC | PRN
  Administered 2022-12-16: 100 mL via INTRAVENOUS

## 2022-12-28 ENCOUNTER — Ambulatory Visit: Payer: Managed Care, Other (non HMO) | Attending: Cardiology | Admitting: *Deleted

## 2022-12-28 DIAGNOSIS — I4892 Unspecified atrial flutter: Secondary | ICD-10-CM

## 2022-12-28 DIAGNOSIS — Z954 Presence of other heart-valve replacement: Secondary | ICD-10-CM | POA: Diagnosis not present

## 2022-12-28 DIAGNOSIS — Z7901 Long term (current) use of anticoagulants: Secondary | ICD-10-CM

## 2022-12-28 LAB — POCT INR: INR: 2.4 (ref 2.0–3.0)

## 2022-12-28 NOTE — Patient Instructions (Signed)
Description   TAKE 2.5 TABLETS TODAY ONLY THEN CONTINUE warfarin 1.5 tablets daily except 2 tablets on Tuesday and Thursday.  Recheck INR in 2 weeks.  C[facility]in Clinic [phone]

## 2022-12-31 ENCOUNTER — Ambulatory Visit: Payer: Managed Care, Other (non HMO) | Attending: Internal Medicine | Admitting: Internal Medicine

## 2022-12-31 ENCOUNTER — Encounter: Payer: Self-pay | Admitting: Internal Medicine

## 2022-12-31 VITALS — BP 110/76 | HR 66 | Ht 67.0 in | Wt 225.6 lb

## 2022-12-31 DIAGNOSIS — I428 Other cardiomyopathies: Secondary | ICD-10-CM

## 2022-12-31 DIAGNOSIS — I4892 Unspecified atrial flutter: Secondary | ICD-10-CM | POA: Diagnosis not present

## 2022-12-31 DIAGNOSIS — I10 Essential (primary) hypertension: Secondary | ICD-10-CM | POA: Diagnosis not present

## 2022-12-31 NOTE — Patient Instructions (Addendum)
Medication Instructions:  Your physician recommends that you continue on your current medications as directed. Please refer to the Current Medication list given to you today.  *If you need a refill on your cardiac medications before your next appointment, please call your pharmacy*  Lab Work: None ordered.  If you have labs (blood work) drawn today and your tests are completely normal, you will receive your results only by: [place] (if you have MyChart) OR A paper copy in the mail If you have any lab test that is abnormal or we need to change your treatment, we will call you to review the results.  Testing/Procedures: None ordered.  Follow-Up: At [facility], you and your health needs are our priority.  As part of our continuing mission to provide you with exceptional heart care, we have created designated Provider Care Teams.  These Care Teams include your primary Cardiologist (physician) and Advanced Practice Providers (APPs -  Physician Assistants and Nurse Practitioners) who all work together to provide you with the care you need, when you need it.  We recommend signing up for the patient portal called "MyChart".  Sign up information is provided on this After Visit Summary.  MyChart is used to connect with patients for Virtual Visits (Telemedicine).  Patients are able to view lab/test results, encounter notes, upcoming appointments, etc.  Non-urgent messages can be sent to your provider as well.   To learn more about what you can do with MyChart, go to [email or web address].    Your next appointment:   2 Year)  follow up appointment with Dr. [name]  The format for your next appointment:   In Person  Provider:   [name], MD{or one of the following Advanced Practice Providers on your designated Care Team:   Tommye Standard, [place] Legrand Como "Jonni Sanger" Chalmers Cater, [place]   Important Information About Sugar

## 2022-12-31 NOTE — Progress Notes (Signed)
HPI Oscar Brown returns today for evaluation of atrial flutter. 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 He developed atrial flutter and underwent ablation in 2019. I have not seen him since. He denies chest pain or sob. No syncope. He is trying to do better with his diet. He denies peripheral edema. No palpitations.  Allergies  Allergen Reactions   Iohexol Hives, Itching and Other (See Comments)    Patient needs 13 hour prep per Dr. Pascal Lux, after cath he broke out in hives and had itching      Current Outpatient Medications  Medication Sig Dispense Refill   aspirin (ASPIR-81) 81 MG EC tablet Take 1 tablet (81 mg total) by mouth daily. Swallow whole. 30 tablet 12   enoxaparin (LOVENOX) 100 MG/ML injection Inject 1 mL (100 mg total) into the skin every 12 (twelve) hours. 20 mL 1   EPINEPHrine 0.3 mg/0.3 mL IJ SOAJ injection as needed.     furosemide (LASIX) 40 MG tablet Take 40 mg by mouth. Monday and Friday     furosemide (LASIX) 40 MG tablet TAKE 1 TABLET BY MOUTH TWICE A WEEK ON  MONDAYS AND FRIDAYS 24 tablet 0   rosuvastatin (CRESTOR) 20 MG tablet Take 20 mg by mouth daily.     warfarin (COUMADIN) 5 MG tablet TAKE 1 AND A HALF TO 2 TABLETS BY MOUTH DAILY AS DIRECTED BY COUMADIN CLINIC 160 tablet 1   No current facility-administered medications for this visit.     Past Medical History:  Diagnosis Date   Acute diastolic congestive heart failure ([place]) 09/21/2015   takes Lasix daily   Acute renal failure (HCC)    ATN s/p cardiac arrest   Atrial fibrillation (HCC)    Bee sting allergy    CAD (coronary artery disease)    cath 10-2015   Chronic venous hypertension due to deep vein thrombosis 12/14/2006   1993.  Complicated by chronic left lower extremity edema and occasional recurrent left lower extremity cellulitis    COPD (chronic obstructive pulmonary disease) (HCC)    DVT  (deep venous thrombosis) ([place]) early 80's   left leg   HLD (hyperlipidemia)    Incidental lung nodule, less than or equal to 55m 11/10/2015   3 mm nodule right lung   Lymphedema early 80's   Mitral regurgitation 10/06/2012   Obstructive sleep apnea 12/14/2006   AHI 19.2/hr, desaturation to 75% on Polysomnography 02/26/2005.  Uses 11 cm H2O nocturnal nasal CPAP with good symptom control.    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   Past Surgical History:  Procedure Laterality Date   A-FLUTTER ABLATION N/A 03/20/2018   Procedure: A-FLUTTER ABLATION;  Surgeon: TEvans Lance MD;  [facility];  Service: Cardiovascular;  Laterality: N/A;   CARDIAC CATHETERIZATION N/A 11/03/2015   Procedure: Right/Left Heart Cath and Coronary Angiography;  Surgeon: DLeonie Man MD;  [facility];  Service: Cardiovascular;  Laterality: N/A;   CARDIOVERSION N/A 12/23/2017   Procedure: CARDIOVERSION;  Surgeon: NAcie FredricksonPWonda Cheng MD;  [place] [facility]  Service: Cardiovascular;  Laterality: N/A;   MITRAL VALVE REPLACEMENT Right 11/13/2015   Procedure: MINIMALLY INVASIVE MITRAL VALVE (MV) REPLACEMENT;  Surgeon: [name], MD;  Location: [place];  Service: Open Heart Surgery;  Laterality: Right;   MULTIPLE EXTRACTIONS WITH ALVEOLOPLASTY N/A 11/07/2015   Procedure: Extraction of tooth #'s 1,2,8,9,12,15,16,17, 32 with alveoloplasty and gross debridement of remaining teeth.;  Surgeon: [name], DDS;  Location: [place];  Service: Oral Surgery;  Laterality: N/A;   TEE WITHOUT CARDIOVERSION N/A 10/16/2015   Procedure: TRANSESOPHAGEAL ECHOCARDIOGRAM (TEE);  Surgeon: Skeet Latch, MD;  Location: [place];   Service: Cardiovascular;  Laterality: N/A;   TEE WITHOUT CARDIOVERSION N/A 11/13/2015   Procedure: TRANSESOPHAGEAL ECHOCARDIOGRAM (TEE);  Surgeon: [name], MD;  Location: [place];  Service: Open Heart Surgery;  Laterality: N/A;   TEE WITHOUT CARDIOVERSION N/A 11/03/2018   Procedure: TRANSESOPHAGEAL ECHOCARDIOGRAM (TEE);  Surgeon: Dorothy Spark, MD;  [facility];  Service: Cardiovascular;  Laterality: N/A;   TEE WITHOUT CARDIOVERSION N/A 12/13/2018   Procedure: TRANSESOPHAGEAL ECHOCARDIOGRAM (TEE);  Surgeon: Jolaine Artist, MD;  Location: [facility] ENDOSCOPY;  Service: Cardiovascular;  Laterality: N/A;     Family History  Problem Relation Age of Onset   Cancer Mother        type unknown but had a colostomy bag   COPD Mother    Hypertension Mother    Other Father        bone disease   Diabetes Father    Hypertension Father    Diabetes Paternal Aunt      Social History   Socioeconomic History   Marital status: Divorced    Spouse name: Not on file   Number of children: 3   Years of education: Not on file   Highest education level: Not on file  Occupational History   Not on file  Tobacco Use   Smoking status: Former    Packs/day: 0.10    Years: 9.00    Total pack years: 0.90    Types: Cigarettes    Quit date: 09/05/2012    Years since quitting: 10.3   Smokeless tobacco: Never  Vaping Use   Vaping Use: Never used  Substance and Sexual Activity   Alcohol use: Yes    Alcohol/week: 0.0 standard drinks of alcohol    Comment: 1 beer once a week or special occasions   Drug use: No   Sexual activity: Yes  Other Topics Concern   Not on file  Social History Narrative   Not on file   Social Determinants of Health   Financial Resource Strain: Not on file  Food Insecurity: Not on file  Transportation Needs: Not on file  Physical Activity: Not on file  Stress: Not on file  Social Connections: Not on file  Intimate Partner Violence: Not on file     BP  110/76   Pulse 66   Ht '5\' 7"'$  (1.702 m)   Wt 225 lb 9.6 oz (102.3 kg)   SpO2 99%   BMI 35.33 kg/m   Physical Exam:  Well appearing NAD HEENT: Unremarkable Neck:  No JVD, no thyromegally Lymphatics:  No adenopathy Back:  No CVA tenderness Lungs:  Clear HEART:  Regular rate rhythm, 1/6 systolic murmur, no rubs, no clicks; mechanical S1. Abd:  soft, positive bowel sounds, no organomegally, no rebound, no guarding Ext:  2 plus pulses, no edema, no cyanosis, no clubbing Skin:  No rashes no nodules Neuro:  CN II through XII intact, motor grossly  intact  EKG - NSR  DEVICE  Normal device function.  See PaceArt for details.   Assess/Plan:  1. Atrial flutter - he is doing well after EP study and ablation. He appears to be maintaining NSR. 2. 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 3. HTN - his blood pressure today is reasonably well controlled. I have discussed the importance of salt reduction.   Mikle [email or web address].D

## 2023-01-11 ENCOUNTER — Ambulatory Visit: Payer: Managed Care, Other (non HMO) | Admitting: Neurology

## 2023-01-11 ENCOUNTER — Encounter: Payer: Self-pay | Admitting: Neurology

## 2023-01-11 VITALS — BP 118/76 | HR 64 | Ht 67.0 in | Wt 225.0 lb

## 2023-01-11 DIAGNOSIS — G4733 Obstructive sleep apnea (adult) (pediatric): Secondary | ICD-10-CM

## 2023-01-11 NOTE — Progress Notes (Signed)
PATIENT: Oscar Brown DOB: [birth year]  REASON FOR VISIT: Follow up for OSA on CPAP HISTORY FROM: Patient PRIMARY NEUROLOGIST: Dr. [name]  HISTORY OF PRESENT ILLNESS: Today 01/11/23 Here today for CPAP follow-up, review of data 12/11/22-01/09/23 shows 100% compliance greater than 4 hours 30/30 days.  Average usage 7 hours 27.  Minimum pressure 7 pressure 14 cmH2O.  EPR off.  95th percentile pressure 11.6, maximum pressure 7.  Leak 7.5, AHI 0.4. had HST in 2021, Set up date was 05/19/20. He has mechanical heart valve, on coumadin. ESS 0. He had a bout of cellulitis to left leg last month. He loves his CPAP. Feels well rested in the day, has good energy. Use nasal mask.  No new issues.  01/06/22 SS: Verda Cumins here today for follow-up with history of OSA on CPAP. HST May 2021, showed moderate OSA.  Is on AutoPap.  Review of CPAP download indicates overall excellent compliance, greater than 4 hours 30/30 days.  Apnea is well treated at 0.5. He loves his CPAP, not tired during the day, isn't falling asleep during the day. Using nasal mask. No problems with equipment.     HISTORY Copied Dr. Guadelupe Sabin Note 07/29/2020: Mr. Oscar Brown is a 57 year old right-handed gentleman with an underlying medical history of mitral regurgitation with status post mitral valve replacement in 2016, on Coumadin, hx of Vfib arrest, Hx of recurrent Aflutter, hyperlipidemia, history of DVT, COPD, history of atrial fibrillation, history of lymphedema, right lower extremity cellulitis, and obesity, who Presents for follow-up consultation of his obstructive sleep apnea.  Recent home sleep testing and starting AutoPap therapy.  Patient is unaccompanied today.  I first met him at the request of his primary care physician, on 03/11/2020, at which time the patient reported a prior diagnosis of obstructive sleep apnea and he was on CPAP therapy. He was not fully compliant with treatment at that time and needed new equipment and new  supplies. He was eligible for new equipment.  He was advised to proceed with sleep testing, he had a home sleep test on 04/14/2020 which indicated moderate obstructive sleep apnea with an AHI of 26.6/h, O2 nadir of 84%.  He was advised to proceed with home AutoPap therapy.  His set up date with the new equipment was 05/27/2020.   Today, 07/29/2020: I reviewed his AutoPap compliance data from 06/28/2020 through 07/27/2020, at which time he used his AutoPap every night with percent use days greater than 4 hours at 100%, indicating superb compliance with an average usage of 5 hours and 42 minutes, residual AHI at goal at 0.7/h, average pressure for the 95th percentile at 11.8 cm with a range of 7 to 14 cm, leak acceptable with a 95th percentile at 10.7 L/min.  He reports doing well, he has adjusted well to AutoPap therapy and is fully compliant with it.  He feels improved, particularly with regards to his daytime energy and daytime somnolence.  He is quite pleased with how he is doing.  He is using a nasal mask, similar to his previous mask style.  He is up-to-date with his supplies and very motivated to continue with treatment.  He has been sleeping an average of 6 hours, sometimes as long as 7 hours.    The patient's allergies, current medications, family history, past medical history, past social history, past surgical history and problem list were reviewed and updated as appropriate.    Previously:    03/11/20: (He) was previously diagnosed with obstructive sleep apnea and  placed on CPAP therapy.  Prior sleep study results are not available for my review today.  I reviewed your office records, phone note from 02/25/2020 as well as telemedicine note from 06/21/2019.  His Epworth sleepiness score is 0/24. A CPAP compliance download was reviewed today, from 02/10/2020 through 03/10/2020, which is a total of 30 days, during which time he used his machine 25 days with percent used days greater than 4 hours at 20% only,  indicating significantly suboptimal compliance with an average usage of 2 hours and 51 minutes, residual AHI 0.7/h, leak very high with a 95th percentile at 54.2 L/min on a pressure of 11 cm.  His DME company is [employer].  He has not received supplies in quite some time, uses a wide Mirage Fx nasal mask.  He reports that he is trying to keep his CPAP on all night but it is uncomfortable as the headgear is loose and he has trouble keeping the mask on.  He has benefited from treatment and would like to get back on the machine with new supplies and an updated machine as soon as possible.  He has a variable sleep schedule given that he has 2 jobs.  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  Generally, he may be in bed between 1230 and 1 AM, rise time is generally around 7 AM.  He does have to go to the bathroom at least once per average night.  He lives with his 57 year old son who is in college.  He has 2 older daughters.  He is separated from his second wife.  Prior sleep study testing was over 10 years ago at [facility] he recalls.  He has an S9 machine from [employer]	[place].  His weight fluctuates a little bit, less in the summertime, a little more in the wintertime.  He does not drink any caffeine and tries to hydrate well with water.  He quit smoking some 5, nearly 6 years ago and drinks alcohol very occasionally.  He has a family history of sleep apnea, one of his daughters has a CPAP machine, 2 sisters have sleep apnea and his younger brother is in the process of being evaluated for sleep apnea as I understand.  REVIEW OF SYSTEMS: Out of a complete 14 system review of symptoms, the patient complains only of the following symptoms, and all other reviewed systems are negative.  See HPI  ALLERGIES: Allergies  Allergen Reactions   Iohexol Hives, Itching and Other (See Comments)    Patient needs 13 hour prep per Dr. Pascal Lux, after cath he broke out in hives and had  itching     HOME MEDICATIONS: Outpatient Medications Prior to Visit  Medication Sig Dispense Refill   aspirin (ASPIR-81) 81 MG EC tablet Take 1 tablet (81 mg total) by mouth daily. Swallow whole. 30 tablet 12   enoxaparin (LOVENOX) 100 MG/ML injection Inject 1 mL (100 mg total) into the skin every 12 (twelve) hours. 20 mL 1   EPINEPHrine 0.3 mg/0.3 mL IJ SOAJ injection as needed.     furosemide (LASIX) 40 MG tablet Take 40 mg by mouth. Monday and Friday     furosemide (LASIX) 40 MG tablet TAKE 1 TABLET BY MOUTH TWICE A WEEK ON  MONDAYS AND FRIDAYS 24 tablet 0   rosuvastatin (CRESTOR) 20 MG tablet Take 20 mg by mouth daily.     warfarin (COUMADIN) 5 MG tablet TAKE 1 AND A HALF TO 2 TABLETS  BY MOUTH DAILY AS DIRECTED BY COUMADIN CLINIC 160 tablet 1   No facility-administered medications prior to visit.    PAST MEDICAL HISTORY: Past Medical History:  Diagnosis Date   Acute diastolic congestive heart failure ([place]) 09/21/2015   takes Lasix daily   Acute renal failure (HCC)    ATN s/p cardiac arrest   Atrial fibrillation (HCC)    Bee sting allergy    CAD (coronary artery disease)    cath 10-2015   Chronic venous hypertension due to deep vein thrombosis 12/14/2006   1993.  Complicated by chronic left lower extremity edema and occasional recurrent left lower extremity cellulitis    COPD (chronic obstructive pulmonary disease) (HCC)    DVT (deep venous thrombosis) ([place]) early 80's   left leg   HLD (hyperlipidemia)    Incidental lung nodule, less than or equal to 104m 11/10/2015   3 mm nodule right lung   Lymphedema early 80's   Mitral regurgitation 10/06/2012   Obstructive sleep apnea 12/14/2006   AHI 19.2/hr, desaturation to 75% on Polysomnography 02/26/2005.  Uses 11 cm H2O nocturnal nasal CPAP with good symptom control.    Old myocardial infarction    OSA on CPAP    S/P minimally invasive mitral valve replacement with metallic valve [id number]  33 mm Carbomedics Optiform bileaflet  mechanical prosthesis placed via right mini-thoracotomy approach    PAST SURGICAL HISTORY: Past Surgical History:  Procedure Laterality Date   A-FLUTTER ABLATION N/A 03/20/2018   Procedure: A-FLUTTER ABLATION;  Surgeon: TEvans Lance MD;  [facility];  Service: Cardiovascular;  Laterality: N/A;   CARDIAC CATHETERIZATION N/A 11/03/2015   Procedure: Right/Left Heart Cath and Coronary Angiography;  Surgeon: DLeonie Man MD;  [facility];  Service: Cardiovascular;  Laterality: N/A;   CARDIOVERSION N/A 12/23/2017   Procedure: CARDIOVERSION;  Surgeon: NAcie FredricksonPWonda Cheng MD;  [facility];  Service: Cardiovascular;  Laterality: N/A;   MITRAL VALVE REPLACEMENT Right 11/13/2015   Procedure: MINIMALLY INVASIVE MITRAL VALVE (MV) REPLACEMENT;  Surgeon: CRexene Alberts MD;  [place] [facility]  Service: Open Heart Surgery;  Laterality: Right;   MULTIPLE EXTRACTIONS WITH ALVEOLOPLASTY N/A 11/07/2015   Procedure: Extraction of tooth #'s 1,2,8,9,12,15,16,17, 32 with alveoloplasty and gross debridement of remaining teeth.;  Surgeon: RLenn Cal DDS;  [facility]  Service: Oral Surgery;  Laterality: N/A;   TEE WITHOUT CARDIOVERSION N/A 10/16/2015   Procedure: TRANSESOPHAGEAL ECHOCARDIOGRAM (TEE);  Surgeon: TSkeet Latch MD;  [facility]  Service: Cardiovascular;  Laterality: N/A;   TEE WITHOUT CARDIOVERSION N/A 11/13/2015   Procedure: TRANSESOPHAGEAL ECHOCARDIOGRAM (TEE);  Surgeon: CRexene Alberts MD;  [place] [facility]  Service: Open Heart Surgery;  Laterality: N/A;   TEE WITHOUT CARDIOVERSION N/A 11/03/2018   Procedure: TRANSESOPHAGEAL ECHOCARDIOGRAM (TEE);  Surgeon: NDorothy Spark MD;  [facility]  Service: Cardiovascular;  Laterality: N/A;   TEE WITHOUT CARDIOVERSION N/A 12/13/2018   Procedure: TRANSESOPHAGEAL ECHOCARDIOGRAM (TEE);  Surgeon: BJolaine Artist MD;  [facility];  Service: Cardiovascular;   Laterality: N/A;    FAMILY HISTORY: Family History  Problem Relation Age of Onset   Cancer Mother        type unknown but had a colostomy bag   COPD Mother    Hypertension Mother    Other Father        bone disease   Diabetes Father    Hypertension Father    Diabetes Paternal Aunt  SOCIAL HISTORY: Social History   Socioeconomic History   Marital status: Divorced    Spouse name: Not on file   Number of children: 3   Years of education: Not on file   Highest education level: Not on file  Occupational History   Not on file  Tobacco Use   Smoking status: Former    Packs/day: 0.10    Years: 9.00    Total pack years: 0.90    Types: Cigarettes    Quit date: 09/05/2012    Years since quitting: 10.3   Smokeless tobacco: Never  Vaping Use   Vaping Use: Never used  Substance and Sexual Activity   Alcohol use: Yes    Alcohol/week: 0.0 standard drinks of alcohol    Comment: 1 beer once a week or special occasions   Drug use: No   Sexual activity: Yes  Other Topics Concern   Not on file  Social History Narrative   Not on file   Social Determinants of Health   Financial Resource Strain: Not on file  Food Insecurity: Not on file  Transportation Needs: Not on file  Physical Activity: Not on file  Stress: Not on file  Social Connections: Not on file  Intimate Partner Violence: Not on file   PHYSICAL EXAM  Vitals:   01/11/23 0831  BP: 118/76  Pulse: 64  Weight: 225 lb (102.1 kg)  Height: 5' 7"$  (1.702 m)   Body mass index is 35.24 kg/m.  Generalized: Well developed, in no acute distress   Neurological examination  Mentation: Alert oriented to time, place, history taking. Follows all commands speech and language fluent Cranial nerve II-XII: Pupils were equal round reactive to light. Extraocular movements were full, visual field were full on confrontational test. Facial sensation and strength were normal.  Head turning and shoulder shrug  were normal and  symmetric. Motor: The motor testing reveals 5 over 5 strength of all 4 extremities. Good symmetric motor tone is noted throughout.  Sensory: Sensory testing is intact to soft touch on all 4 extremities. No evidence of extinction is noted.  Coordination: Cerebellar testing reveals good finger-nose-finger and heel-to-shin bilaterally.  Gait and station: Gait is normal.   DIAGNOSTIC DATA (LABS, IMAGING, TESTING) - I reviewed patient records, labs, notes, testing and imaging myself where available.  Lab Results  Component Value Date   WBC 4.0 01/19/2022   HGB 15.0 01/19/2022   HCT 45.9 01/19/2022   MCV 88.4 01/19/2022   PLT 186 01/19/2022      Component Value Date/Time   NA 138 01/06/2022 0912   K 4.4 01/06/2022 0912   CL 101 01/06/2022 0912   CO2 23 01/06/2022 0912   GLUCOSE 78 01/06/2022 0912   GLUCOSE 97 10/20/2020 1030   BUN 17 01/06/2022 0912   CREATININE 0.81 01/06/2022 0912   CREATININE 1.13 01/05/2016 1403   CALCIUM 9.1 01/06/2022 0912   PROT 8.7 (H) 11/01/2018 1551   ALBUMIN 4.0 11/01/2018 1551   AST 54 (H) 11/01/2018 1551   ALT 31 11/01/2018 1551   ALKPHOS 74 11/01/2018 1551   BILITOT 1.5 (H) 11/01/2018 1551   GFRNONAA >60 10/20/2020 1030   GFRAA >60 12/14/2018 1102   Lab Results  Component Value Date   CHOL 187 09/22/2015   HDL 60 09/22/2015   LDLCALC 116 (H) 09/22/2015   TRIG 54 09/22/2015   CHOLHDL 3.1 09/22/2015   Lab Results  Component Value Date   HGBA1C 5.7 (H) 11/11/2015   No results found  for: "VITAMINB12" No results found for: "TSH"  ASSESSMENT AND PLAN 57 y.o. year old male HST in May 2021 showed moderate OSA with an AHI of 26.6/h, his set up date  June 2021 on AutoPap therapy  1.  OSA on CPAP  -Encouraged to continue excellent compliance, has wonderful benefit with CPAP -Continue nightly use, greater than 4 hours, order for supplies will be sent to DME -Follow-up in 1 year, he prefers an office visit  Evangeline Dakin, DNP 01/11/2023,  9:00 AM [facility] [address], [place], [place] [address] [phone]

## 2023-01-12 ENCOUNTER — Ambulatory Visit: Payer: Managed Care, Other (non HMO) | Attending: Cardiovascular Disease

## 2023-01-12 DIAGNOSIS — Z7901 Long term (current) use of anticoagulants: Secondary | ICD-10-CM

## 2023-01-12 DIAGNOSIS — Z954 Presence of other heart-valve replacement: Secondary | ICD-10-CM | POA: Diagnosis not present

## 2023-01-12 DIAGNOSIS — I4892 Unspecified atrial flutter: Secondary | ICD-10-CM | POA: Diagnosis not present

## 2023-01-12 LAB — POCT INR: INR: 2.7 (ref 2.0–3.0)

## 2023-01-12 NOTE — Patient Instructions (Signed)
Description   Continue warfarin 1.5 tablets daily except 2 tablets on Tuesday and Thursday.   Recheck INR in 5 weeks.  C[facility]in Clinic [phone]

## 2023-02-15 ENCOUNTER — Ambulatory Visit: Payer: Managed Care, Other (non HMO) | Attending: Cardiovascular Disease | Admitting: *Deleted

## 2023-02-15 ENCOUNTER — Ambulatory Visit: Payer: Managed Care, Other (non HMO)

## 2023-02-15 DIAGNOSIS — Z954 Presence of other heart-valve replacement: Secondary | ICD-10-CM

## 2023-02-15 DIAGNOSIS — Z7901 Long term (current) use of anticoagulants: Secondary | ICD-10-CM | POA: Diagnosis not present

## 2023-02-15 DIAGNOSIS — I4892 Unspecified atrial flutter: Secondary | ICD-10-CM | POA: Diagnosis not present

## 2023-02-15 LAB — POCT INR: INR: 2.5 (ref 2.0–3.0)

## 2023-02-15 NOTE — Patient Instructions (Signed)
Description   Today take 2.5 tablets then continue warfarin 1.5 tablets daily except 2 tablets on Tuesday and Thursday. Recheck INR in 5 weeks.  Coumadin Clinic [phone]

## 2023-02-17 ENCOUNTER — Other Ambulatory Visit: Payer: Self-pay | Admitting: Internal Medicine

## 2023-02-17 DIAGNOSIS — Z952 Presence of prosthetic heart valve: Secondary | ICD-10-CM

## 2023-02-21 ENCOUNTER — Other Ambulatory Visit: Payer: Self-pay | Admitting: Internal Medicine

## 2023-03-02 ENCOUNTER — Encounter: Payer: Self-pay | Admitting: Podiatry

## 2023-03-02 ENCOUNTER — Ambulatory Visit: Payer: Managed Care, Other (non HMO) | Admitting: Podiatry

## 2023-03-02 DIAGNOSIS — L84 Corns and callosities: Secondary | ICD-10-CM | POA: Diagnosis not present

## 2023-03-02 DIAGNOSIS — D6859 Other primary thrombophilia: Secondary | ICD-10-CM | POA: Diagnosis not present

## 2023-03-02 NOTE — Progress Notes (Unsigned)
Subjective: Oscar Brown presents today referred by Oscar Brown., MD for complaint of  Chief Complaint  Patient presents with   Callouses    Bil callus treatment Oscar Brown PCP VST-02/28/2023   {jgcomplaint:23593}.   Past Medical History:  Diagnosis Date   Acute diastolic congestive heart failure ([place]) 09/21/2015   takes Lasix daily   Acute renal failure (HCC)    ATN s/p cardiac arrest   Atrial fibrillation (HCC)    Bee sting allergy    CAD (coronary artery disease)    cath 10-2015   Chronic venous hypertension due to deep vein thrombosis 12/14/2006   1993.  Complicated by chronic left lower extremity edema and occasional recurrent left lower extremity cellulitis    COPD (chronic obstructive pulmonary disease) (HCC)    DVT (deep venous thrombosis) ([place]) early 80's   left leg   HLD (hyperlipidemia)    Incidental lung nodule, less than or equal to 25mm 11/10/2015   3 mm nodule right lung   Lymphedema early 80's   Mitral regurgitation 10/06/2012   Obstructive sleep apnea 12/14/2006   AHI 19.2/hr, desaturation to 75% on Polysomnography 02/26/2005.  Uses 11 cm H2O nocturnal nasal CPAP with good symptom control.    Old myocardial infarction    OSA on CPAP    S/P minimally invasive mitral valve replacement with metallic valve [id number]   33 mm Carbomedics Optiform bileaflet mechanical prosthesis placed via right mini-thoracotomy approach     Patient Active Problem List   Diagnosis Date Noted   Nonischemic cardiomyopathy 12/31/2022   Allergic reaction to bee sting 01/06/2022   History of thromboembolism of vein 01/06/2022   Hypercoagulable state 01/06/2022   Personal history of other diseases of the circulatory system 01/06/2022   Hyperlipidemia 06/21/2019   Medication monitoring encounter 12/06/2018   Acute bacterial endocarditis    H/O mitral valve replacement with mechanical valve    Essential hypertension    Old myocardial infarction 06/15/2018    Lymphedema 06/15/2018   Presence of prosthetic heart valve 06/15/2018   Atherosclerotic heart disease of native coronary artery without angina pectoris 12/23/2016   Long term (current) use of anticoagulants [Z79.01] 12/05/2015   Atrial flutter    History of cardiac arrest    Incidental lung nodule, less than or equal to 42mm 11/10/2015   Tobacco use disorder 06/05/2015   Preventative health care 10/06/2012   Obesity, Class I, BMI 30-34.9 12/14/2006   OSA on CPAP 12/14/2006   Chronic venous insufficiency 12/14/2006     Past Surgical History:  Procedure Laterality Date   A-FLUTTER ABLATION N/A 03/20/2018   Procedure: A-FLUTTER ABLATION;  Surgeon: Evans Lance, MD;  Location: [place] [facility];  Service: Cardiovascular;  Laterality: N/A;   CARDIAC CATHETERIZATION N/A 11/03/2015   Procedure: Right/Left Heart Cath and Coronary Angiography;  Surgeon: Leonie Man, MD;  Location: [place] [facility];  Service: Cardiovascular;  Laterality: N/A;   CARDIOVERSION N/A 12/23/2017   Procedure: CARDIOVERSION;  Surgeon: Acie Fredrickson Wonda Cheng, MD;  Location: [facility] ENDOSCOPY;  Service: Cardiovascular;  Laterality: N/A;   MITRAL VALVE REPLACEMENT Right 11/13/2015   Procedure: MINIMALLY INVASIVE MITRAL VALVE (MV) REPLACEMENT;  Surgeon: [name], MD;  Location: [place];  Service: Open Heart Surgery;  Laterality: Right;   MULTIPLE EXTRACTIONS WITH ALVEOLOPLASTY N/A 11/07/2015   Procedure: Extraction of tooth #'s 1,2,8,9,12,15,16,17, 32 with alveoloplasty and gross debridement of remaining teeth.;  Surgeon: [name], DDS;  Location: [place];  Service: Oral  Surgery;  Laterality: N/A;   TEE WITHOUT CARDIOVERSION N/A 10/16/2015   Procedure: TRANSESOPHAGEAL ECHOCARDIOGRAM (TEE);  Surgeon: Skeet Latch, MD;  Location: [place];  Service: Cardiovascular;  Laterality: N/A;   TEE WITHOUT CARDIOVERSION N/A 11/13/2015   Procedure: TRANSESOPHAGEAL ECHOCARDIOGRAM (TEE);  Surgeon: [name], MD;   Location: [place];  Service: Open Heart Surgery;  Laterality: N/A;   TEE WITHOUT CARDIOVERSION N/A 11/03/2018   Procedure: TRANSESOPHAGEAL ECHOCARDIOGRAM (TEE);  Surgeon: Dorothy Spark, MD;  Location: [facility] ENDOSCOPY;  Service: Cardiovascular;  Laterality: N/A;   TEE WITHOUT CARDIOVERSION N/A 12/13/2018   Procedure: TRANSESOPHAGEAL ECHOCARDIOGRAM (TEE);  Surgeon: Jolaine Artist, MD;  Location: [facility] ENDOSCOPY;  Service: Cardiovascular;  Laterality: N/A;     Current Outpatient Medications on File Prior to Visit  Medication Sig Dispense Refill   aspirin (ASPIR-81) 81 MG EC tablet Take 1 tablet (81 mg total) by mouth daily. Swallow whole. 30 tablet 12   enoxaparin (LOVENOX) 100 MG/ML injection Inject 1 mL (100 mg total) into the skin every 12 (twelve) hours. 20 mL 1   EPINEPHrine 0.3 mg/0.3 mL IJ SOAJ injection as needed.     furosemide (LASIX) 40 MG tablet Take 40 mg by mouth. Monday and Friday     furosemide (LASIX) 40 MG tablet TAKE 1 TABLET BY MOUTH TWICE A WEEK ON MONDAYS AND FRIDAYS 24 tablet 0   rosuvastatin (CRESTOR) 20 MG tablet Take 20 mg by mouth daily.     warfarin (COUMADIN) 5 MG tablet TAKE 1 AND 1/2 TO 2 TABLETS BY MOUTH DAILY AS DIRECTED BY COUMADIN CLINIC 160 tablet 1   No current facility-administered medications on file prior to visit.     Allergies  Allergen Reactions   Iohexol Hives, Itching and Other (See Comments)    Patient needs 13 hour prep per Dr. Pascal Lux, after cath he broke out in hives and had itching      Social History   Occupational History   Not on file  Tobacco Use   Smoking status: Former    Packs/day: 0.10    Years: 9.00    Additional pack years: 0.00    Total pack years: 0.90    Types: Cigarettes    Quit date: 09/05/2012    Years since quitting: 10.4   Smokeless tobacco: Never  Vaping Use   Vaping Use: Never used  Substance and Sexual Activity   Alcohol use: Yes    Alcohol/week: 0.0 standard drinks of alcohol    Comment: 1 beer once a week  or special occasions   Drug use: No   Sexual activity: Yes     Family History  Problem Relation Age of Onset   Cancer Mother        type unknown but had a colostomy bag   COPD Mother    Hypertension Mother    Other Father        bone disease   Diabetes Father    Hypertension Father    Diabetes Paternal Aunt      Immunization History  Administered Date(s) Administered   Influenza Split 10/06/2012   Influenza Whole 11/02/2005   Influenza,inj,Quad PF,6+ Mos 07/19/2014, 09/23/2015   PFIZER(Purple Top)SARS-COV-2 Vaccination 02/09/2020, 03/03/2020   Pneumococcal Polysaccharide-23 11/02/2018   Tdap 07/19/2014     Objective: There were no vitals filed for this visit.  HARALAMBOS DEMIRJIAN is a pleasant 57 y.o. male {jgbodyhabitus:24098} AAO x 3.  Vascular Examination: Vascular status intact b/l with palpable pedal pulses. Pedal hair present  b/l. CFT immediate b/l. No edema. No pain with calf compression b/l. Skin temperature gradient WNL b/l. {jgvascular:23595}  Neurological Examination: Sensation grossly intact b/l with 10 gram monofilament. Vibratory sensation intact b/l. {jgneuro:23601::"Protective sensation intact 5/5 intact bilaterally with 10g monofilament b/l.","Vibratory sensation intact b/l.","Proprioception intact bilaterally."}  Dermatological Examination: Pedal skin with normal turgor, texture and tone b/l. Toenails 1-5 b/l thick, discolored, elongated with subungual debris and pain on dorsal palpation. No hyperkeratotic lesions noted b/l. {jgderm:23598}  Musculoskeletal Examination: Muscle strength 5/5 to b/l LE. {jgmsk:23600}  Radiographs: None  {jgxrayfindings:23683}  Last A1c:       No data to display          Assessment: No diagnosis found.   Plan: {Jgplan:23602::"-Patient/POA to call should there be question/concern in the interim."}  Return in about 3 months (around 06/01/2023).  Marzetta Board, DPM

## 2023-03-15 ENCOUNTER — Other Ambulatory Visit: Payer: Self-pay | Admitting: *Deleted

## 2023-03-15 DIAGNOSIS — I872 Venous insufficiency (chronic) (peripheral): Secondary | ICD-10-CM

## 2023-03-22 ENCOUNTER — Ambulatory Visit: Payer: Managed Care, Other (non HMO) | Attending: Cardiovascular Disease | Admitting: *Deleted

## 2023-03-22 DIAGNOSIS — Z954 Presence of other heart-valve replacement: Secondary | ICD-10-CM

## 2023-03-22 DIAGNOSIS — I4892 Unspecified atrial flutter: Secondary | ICD-10-CM | POA: Diagnosis not present

## 2023-03-22 DIAGNOSIS — Z7901 Long term (current) use of anticoagulants: Secondary | ICD-10-CM | POA: Diagnosis not present

## 2023-03-22 LAB — POCT INR: INR: 3 (ref 2.0–3.0)

## 2023-03-22 NOTE — Patient Instructions (Signed)
Description   Continue warfarin 1.5 tablets daily except 2 tablets on Tuesday and Thursday. Recheck INR in 6 weeks. Coumadin Clinic [phone] or [phone]

## 2023-03-24 ENCOUNTER — Ambulatory Visit (HOSPITAL_COMMUNITY)
Admission: RE | Admit: 2023-03-24 | Discharge: 2023-03-24 | Disposition: A | Discharge status: Home / Self Care | Payer: Managed Care, Other (non HMO) | Source: Ambulatory Visit | Attending: Vascular Surgery | Admitting: Vascular Surgery

## 2023-03-24 ENCOUNTER — Ambulatory Visit: Payer: Managed Care, Other (non HMO) | Admitting: Physician Assistant

## 2023-03-24 VITALS — BP 136/80 | HR 65 | Temp 98.0°F | Resp 18 | Ht 66.0 in | Wt 223.2 lb

## 2023-03-24 DIAGNOSIS — I872 Venous insufficiency (chronic) (peripheral): Secondary | ICD-10-CM

## 2023-03-24 DIAGNOSIS — I890 Lymphedema, not elsewhere classified: Secondary | ICD-10-CM | POA: Diagnosis not present

## 2023-03-24 NOTE — Progress Notes (Signed)
Office Note     CC:  follow up Requesting Provider:  Olevia Perches, NP  HPI: Oscar Brown is a 57 y.o. ([birth year]) male who presents for evaluation of left lower extremity lymphedema.  Mr. Strole has had lymphedema in his left leg diagnosed over 20 years ago.  He manages the edema by wearing 30 to 40 mmHg knee-high compression on a daily basis.  He elevates his legs when he gets home from work.  He works at [employer] however also does jobs on the side including mowing grass, pressure washing, housework.  Despite daily exercise, elevation, and compression over the past several years he still has some difficulty managing his lymphedema.  He once was fitted for lymphedema pump however was unable to receive the pump due to a gap in insurance coverage.  He denies tobacco use.  He denies rest pain, tissue loss, claudication of bilateral lower extremities.  He is on Coumadin for atrial fibrillation.   Past Medical History:  Diagnosis Date   Acute diastolic congestive heart failure 09/21/2015   takes Lasix daily   Acute renal failure    ATN s/p cardiac arrest   Atrial fibrillation    Bee sting allergy    CAD (coronary artery disease)    cath 10-2015   Chronic venous hypertension due to deep vein thrombosis 12/14/2006   1993.  Complicated by chronic left lower extremity edema and occasional recurrent left lower extremity cellulitis    COPD (chronic obstructive pulmonary disease)    DVT (deep venous thrombosis) early 80's   left leg   HLD (hyperlipidemia)    Incidental lung nodule, less than or equal to 3mm 11/10/2015   3 mm nodule right lung   Lymphedema early 80's   Mitral regurgitation 10/06/2012   Obstructive sleep apnea 12/14/2006   AHI 19.2/hr, desaturation to 75% on Polysomnography 02/26/2005.  Uses 11 cm H2O nocturnal nasal CPAP with good symptom control.    Old myocardial infarction    OSA on CPAP    S/P minimally invasive mitral valve replacement with metallic valve  11/13/2015   33 mm Carbomedics Optiform bileaflet mechanical prosthesis placed via right mini-thoracotomy approach    Past Surgical History:  Procedure Laterality Date   A-FLUTTER ABLATION N/A 03/20/2018   Procedure: A-FLUTTER ABLATION;  Surgeon: Marinus Maw, MD;  Location: [facility]ASIVE [facility] LAB;  Service: Cardiovascular;  Laterality: N/A;   CARDIAC CATHETERIZATION N/A 11/03/2015   Procedure: Right/Left Heart Cath and Coronary Angiography;  Surgeon: Marykay Lex, MD;  Location: [facility] INVASIVE CV LAB;  Service: Cardiovascular;  Laterality: N/A;   CARDIOVERSION N/A 12/23/2017   Procedure: CARDIOVERSION;  Surgeon: Elease Hashimoto Deloris Ping, MD;  Location: [facility] ENDOSCOPY;  Service: Cardiovascular;  Laterality: N/A;   MITRAL VALVE REPLACEMENT Right 11/13/2015   Procedure: MINIMALLY INVASIVE MITRAL VALVE (MV) REPLACEMENT;  Surgeon: Purcell Nails, MD;  Location: [facility] OR;  Service: Open Heart Surgery;  Laterality: Right;   MULTIPLE EXTRACTIONS WITH ALVEOLOPLASTY N/A 11/07/2015   Procedure: Extraction of tooth #'s 1,2,8,9,12,15,16,17, 32 with alveoloplasty and gross debridement of remaining teeth.;  Surgeon: Charlynne Pander, DDS;  Location: [facility] OR;  Service: Oral Surgery;  Laterality: N/A;   TEE WITHOUT CARDIOVERSION N/A 10/16/2015   Procedure: TRANSESOPHAGEAL ECHOCARDIOGRAM (TEE);  Surgeon: Chilton Si, MD;  Location: [facility] ENDOSCOPY;  Service: Cardiovascular;  Laterality: N/A;   TEE WITHOUT CARDIOVERSION N/A 11/13/2015   Procedure: TRANSESOPHAGEAL ECHOCARDIOGRAM (TEE);  Surgeon: Purcell Nails, MD;  Location: [facility] OR;  Service: Open  Heart Surgery;  Laterality: N/A;   TEE WITHOUT CARDIOVERSION N/A 11/03/2018   Procedure: TRANSESOPHAGEAL ECHOCARDIOGRAM (TEE);  Surgeon: Lars Masson, MD;  Location: [facility] ENDOSCOPY;  Service: Cardiovascular;  Laterality: N/A;   TEE WITHOUT CARDIOVERSION N/A 12/13/2018   Procedure: TRANSESOPHAGEAL ECHOCARDIOGRAM (TEE);  Surgeon: Dolores Patty, MD;  Location: [facility] ENDOSCOPY;   Service: Cardiovascular;  Laterality: N/A;    Social History   Socioeconomic History   Marital status: Divorced    Spouse name: Not on file   Number of children: 3   Years of education: Not on file   Highest education level: Not on file  Occupational History   Not on file  Tobacco Use   Smoking status: Former    Packs/day: 0.10    Years: 9.00    Additional pack years: 0.00    Total pack years: 0.90    Types: Cigarettes    Quit date: 09/05/2012    Years since quitting: 10.5   Smokeless tobacco: Never  Vaping Use   Vaping Use: Never used  Substance and Sexual Activity   Alcohol use: Yes    Alcohol/week: 0.0 standard drinks of alcohol    Comment: 1 beer once a week or special occasions   Drug use: No   Sexual activity: Yes  Other Topics Concern   Not on file  Social History Narrative   Not on file   Social Determinants of Health   Financial Resource Strain: Not on file  Food Insecurity: Not on file  Transportation Needs: Not on file  Physical Activity: Not on file  Stress: Not on file  Social Connections: Not on file  Intimate Partner Violence: Not on file    Family History  Problem Relation Age of Onset   Cancer Mother        type unknown but had a colostomy bag   COPD Mother    Hypertension Mother    Other Father        bone disease   Diabetes Father    Hypertension Father    Diabetes Paternal Aunt     Current Outpatient Medications  Medication Sig Dispense Refill   aspirin (ASPIR-81) 81 MG EC tablet Take 1 tablet (81 mg total) by mouth daily. Swallow whole. 30 tablet 12   enoxaparin (LOVENOX) 100 MG/ML injection Inject 1 mL (100 mg total) into the skin every 12 (twelve) hours. 20 mL 1   EPINEPHrine 0.3 mg/0.3 mL IJ SOAJ injection as needed.     furosemide (LASIX) 40 MG tablet Take 40 mg by mouth. Monday and Friday     furosemide (LASIX) 40 MG tablet TAKE 1 TABLET BY MOUTH TWICE A WEEK ON MONDAYS AND FRIDAYS 24 tablet 0   rosuvastatin (CRESTOR) 20 MG  tablet Take 20 mg by mouth daily.     warfarin (COUMADIN) 5 MG tablet TAKE 1 AND 1/2 TO 2 TABLETS BY MOUTH DAILY AS DIRECTED BY COUMADIN CLINIC 160 tablet 1   No current facility-administered medications for this visit.    Allergies  Allergen Reactions   Iohexol Hives, Itching and Other (See Comments)    Patient needs 13 hour prep per Dr. Grace Isaac, after cath he broke out in hives and had itching      REVIEW OF SYSTEMS:   [X]  denotes positive finding, [ ]  denotes negative finding Cardiac  Comments:  Chest pain or chest pressure:    Shortness of breath upon exertion:    Short of breath when lying flat:  Irregular heart rhythm:        Vascular    Pain in calf, thigh, or hip brought on by ambulation:    Pain in feet at night that wakes you up from your sleep:     Blood clot in your veins:    Leg swelling:         Pulmonary    Oxygen at home:    Productive cough:     Wheezing:         Neurologic    Sudden weakness in arms or legs:     Sudden numbness in arms or legs:     Sudden onset of difficulty speaking or slurred speech:    Temporary loss of vision in one eye:     Problems with dizziness:         Gastrointestinal    Blood in stool:     Vomited blood:         Genitourinary    Burning when urinating:     Blood in urine:        Psychiatric    Major depression:         Hematologic    Bleeding problems:    Problems with blood clotting too easily:        Skin    Rashes or ulcers:        Constitutional    Fever or chills:      PHYSICAL EXAMINATION:  Vitals:   03/24/23 1357  BP: 136/80  Pulse: 65  Resp: 18  Temp: 98 F (36.7 C)  TempSrc: Temporal  SpO2: 98%  Weight: 223 lb 3.2 oz (101.2 kg)  Height: 5\' 6"  (1.676 m)    General:  WDWN in NAD; vital signs documented above Gait: Not observed HENT: WNL, normocephalic Pulmonary: normal non-labored breathing , without Rales, rhonchi,  wheezing Cardiac: regular HR Abdomen: soft, NT, no masses Skin:  without rashes Vascular Exam/Pulses: palpable R PT and L DP Extremities: without ischemic changes, without Gangrene , without cellulitis; without open wounds; left ankle with hyperpigmentation Musculoskeletal: no muscle wasting or atrophy  Neurologic: A&O X 3 Psychiatric:  The pt has Normal affect.    Non-Invasive Vascular Imaging:    LEFT          Reflux NoRefluxReflux TimeDiameter cmsComments                                  Yes                                            +--------------+---------+------+-----------+------------+----------------+   CFV          no                                                       +--------------+---------+------+-----------+------------+----------------+   FV mid                  yes   >1 second                                +--------------+---------+------+-----------+------------+----------------+  Popliteal    no                                                       +--------------+---------+------+-----------+------------+----------------+   GSV at [facility]              yes    >500 ms      1.10                       +--------------+---------+------+-----------+------------+----------------+   GSV prox thigh          yes    >500 ms      0.76                       +--------------+---------+------+-----------+------------+----------------+   GSV mid thigh no                            0.20                       +--------------+---------+------+-----------+------------+----------------+   GSV dist thigh                                      out of fascia      +--------------+---------+------+-----------+------------+----------------+   SSV Pop Fossa no                            0.69                       +--------------+---------+------+-----------+------------+----------------+   SSV prox calf no                            0.51                        +--------------+---------+------+-----------+------------+----------------+   SSV mid calf                                        chronic  thrombus  +--------------+---------+------+-----------+------------+----------------+     ASSESSMENT/PLAN:: 57 y.o. male here for evaluation of left lower extremity lymphedema  -Mr. Burnham has been managing lymphedema of the left lower extremity over several decades.  On a daily basis he exercises, wears knee-high 30 to 40 mmHg compression sock, and elevates his legs above the level of his heart.  Despite doing these over the last several months he still has trouble managing his lymphedema.  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  He has obvious lymphedema pictured above along with hyperpigmentation of the skin of his left lower leg.  He would be an excellent candidate for a lymphedema pump.  We will submit his paperwork for insurance approval.  I have contacted [employer] to start the process.   Emilie Rutter, PA-C [facility] [phone]  Clinic MD:   Edilia Bo

## 2023-05-03 ENCOUNTER — Ambulatory Visit: Payer: Managed Care, Other (non HMO) | Attending: Cardiology | Admitting: *Deleted

## 2023-05-03 DIAGNOSIS — I4892 Unspecified atrial flutter: Secondary | ICD-10-CM | POA: Diagnosis not present

## 2023-05-03 DIAGNOSIS — Z954 Presence of other heart-valve replacement: Secondary | ICD-10-CM

## 2023-05-03 DIAGNOSIS — Z7901 Long term (current) use of anticoagulants: Secondary | ICD-10-CM

## 2023-05-03 LAB — POCT INR: POC INR: 2.6

## 2023-05-03 NOTE — Patient Instructions (Signed)
Description   Continue warfarin 1.5 tablets daily except 2 tablets on Tuesday and Thursday. Recheck INR in 6 weeks. [facility] [phone] or [phone]     

## 2023-05-04 ENCOUNTER — Other Ambulatory Visit: Payer: Self-pay | Admitting: Internal Medicine

## 2023-06-13 ENCOUNTER — Ambulatory Visit: Payer: Managed Care, Other (non HMO)

## 2023-06-16 ENCOUNTER — Ambulatory Visit: Payer: Managed Care, Other (non HMO) | Attending: Cardiovascular Disease | Admitting: *Deleted

## 2023-06-16 DIAGNOSIS — Z7901 Long term (current) use of anticoagulants: Secondary | ICD-10-CM | POA: Diagnosis not present

## 2023-06-16 DIAGNOSIS — I4892 Unspecified atrial flutter: Secondary | ICD-10-CM | POA: Diagnosis not present

## 2023-06-16 DIAGNOSIS — Z954 Presence of other heart-valve replacement: Secondary | ICD-10-CM | POA: Diagnosis not present

## 2023-06-16 LAB — POCT INR: INR: 5.5 — AB (ref 2.0–3.0)

## 2023-06-16 NOTE — Patient Instructions (Signed)
Description   Do not take any warfarin today and no warfarin tomorrow then continue warfarin 1.5 tablets daily except 2 tablets on Tuesday and Thursday. Recheck INR in 1 week (normally 6 weeks). [facility] [phone] or [phone]

## 2023-06-24 ENCOUNTER — Ambulatory Visit: Payer: Managed Care, Other (non HMO) | Attending: Cardiovascular Disease

## 2023-06-24 DIAGNOSIS — I4892 Unspecified atrial flutter: Secondary | ICD-10-CM | POA: Diagnosis not present

## 2023-06-24 DIAGNOSIS — Z954 Presence of other heart-valve replacement: Secondary | ICD-10-CM

## 2023-06-24 DIAGNOSIS — Z7901 Long term (current) use of anticoagulants: Secondary | ICD-10-CM

## 2023-06-24 LAB — POCT INR: INR: 2.3 (ref 2.0–3.0)

## 2023-06-24 NOTE — Patient Instructions (Signed)
TAKE 2 TABLETS TODAY ONLY then continue warfarin 1.5 tablets daily except 2 tablets on Tuesday and Thursday. Recheck INR in 6 week (normally 6 weeks). [facility] [phone] or [phone]

## 2023-07-05 ENCOUNTER — Ambulatory Visit: Payer: Managed Care, Other (non HMO) | Admitting: Podiatry

## 2023-07-05 ENCOUNTER — Encounter: Payer: Self-pay | Admitting: Podiatry

## 2023-07-05 DIAGNOSIS — L84 Corns and callosities: Secondary | ICD-10-CM | POA: Diagnosis not present

## 2023-07-05 DIAGNOSIS — D6859 Other primary thrombophilia: Secondary | ICD-10-CM | POA: Diagnosis not present

## 2023-07-15 NOTE — Progress Notes (Signed)
  Subjective:  Patient ID: Oscar Brown, male    DOB: 05/13/1966,  MRN: [id number]  Oscar Brown presents to clinic today for: at risk foot care with h/o clotting disorder and callus(es) of both feet. Aggravating factors include weightbearing with and without shoe gear. Pain is relieved with periodic professional debridement.  Chief Complaint  Patient presents with   Oscar Brown     PCP is Cleatis Polka., MD.  Allergies  Allergen Reactions   Iohexol Hives, Itching and Other (See Comments)    Patient needs 13 hour prep per Dr. Grace Isaac, after cath he broke out in hives and had itching     Review of Systems: Negative except as noted in the HPI.  Objective: No changes noted in today's physical examination. There were no vitals filed for this visit.  Oscar Brown is a pleasant 57 y.o. male in NAD. AAO x 3.  Vascular Examination: Capillary refill time <3 seconds b/l LE. Palpable pedal pulses b/l LE. Digital hair present b/l. No pedal edema b/l. Skin temperature gradient WNL b/l. Patient wearing compression hose on today's visit. Evidence of chronic venous insufficiency left lower extremity..  Dermatological Examination: Pedal skin with normal turgor, texture and tone b/l. No open wounds. No interdigital macerations b/l. Hyperkeratotic lesion(s) L hallux, submet head 1 right foot, submet head 5 right foot, and sub 5th met base left foot.  No erythema, no edema, no drainage, no fluctuance..  Neurological Examination: Protective sensation intact with 10 gram monofilament b/l LE. Vibratory sensation intact b/l LE.   Musculoskeletal Examination: Muscle strength 5/5 to all lower extremity muscle groups bilaterally. Plantarflexed metatarsal(s) 1st metatarsal head right foot and 5th metatarsal head right foot. Pes planus deformity noted bilateral LE.  Assessment/Plan: 1. Callus   2. Hypercoagulable state (HCC)     -Consent given for treatment as described  below: -Examined patient. -Patient to continue soft, supportive shoe gear daily. -Callus(es) left great toe, submet head 1 right foot, submet head 5 right foot, and sub 5th met base left foot pared utilizing sterile scalpel blade without complication or incident. Total number debrided =4. -Patient/POA to call should there be question/concern in the interim.   Return in about 3 months (around 10/05/2023).  Oscar Brown, DPM

## 2023-07-24 ENCOUNTER — Other Ambulatory Visit: Payer: Self-pay | Admitting: Internal Medicine

## 2023-08-05 ENCOUNTER — Ambulatory Visit: Payer: Managed Care, Other (non HMO) | Attending: Cardiovascular Disease | Admitting: *Deleted

## 2023-08-05 DIAGNOSIS — I4892 Unspecified atrial flutter: Secondary | ICD-10-CM | POA: Diagnosis not present

## 2023-08-05 DIAGNOSIS — Z954 Presence of other heart-valve replacement: Secondary | ICD-10-CM

## 2023-08-05 DIAGNOSIS — Z7901 Long term (current) use of anticoagulants: Secondary | ICD-10-CM | POA: Diagnosis not present

## 2023-08-05 LAB — POCT INR: INR: 4.9 — AB (ref 2.0–3.0)

## 2023-08-05 NOTE — Patient Instructions (Signed)
Description   Do not take any warfarin today and no warfarin tomorrow then continue warfarin 1.5 tablets daily except 2 tablets on Tuesday and Thursday. Recheck INR in 4 weeks (normally 6 weeks). [facility] [phone] or [phone]

## 2023-08-09 ENCOUNTER — Other Ambulatory Visit: Payer: Self-pay

## 2023-08-09 DIAGNOSIS — Z952 Presence of prosthetic heart valve: Secondary | ICD-10-CM

## 2023-08-09 MED ORDER — WARFARIN SODIUM 5 MG PO TABS: ORAL_TABLET | ORAL | 0 refills | Status: DC

## 2023-08-09 NOTE — Telephone Encounter (Signed)
Prescription refill request received for warfarin Lov: 12/31/22 Oscar Brown)  Next INR check: 09/02/23 Warfarin tablet strength: 5mg   Appropriate dose. Refill sent.

## 2023-08-20 ENCOUNTER — Other Ambulatory Visit: Payer: Self-pay | Admitting: Internal Medicine

## 2023-09-02 ENCOUNTER — Ambulatory Visit: Payer: Managed Care, Other (non HMO) | Attending: Cardiology

## 2023-09-02 DIAGNOSIS — Z7901 Long term (current) use of anticoagulants: Secondary | ICD-10-CM

## 2023-09-02 LAB — POCT INR: INR: 2.1 (ref 2.0–3.0)

## 2023-09-02 NOTE — Patient Instructions (Signed)
Take 2 tablets today then continue warfarin 1.5 tablets daily except 2 tablets on Tuesday and Thursday. Recheck INR in 4 weeks (normally 6 weeks). [facility] [phone] or [phone]

## 2023-09-30 ENCOUNTER — Ambulatory Visit
Admission: RE | Admit: 2023-09-30 | Discharge: 2023-09-30 | Disposition: A | Discharge status: Home / Self Care | Payer: Managed Care, Other (non HMO) | Source: Ambulatory Visit | Attending: Internal Medicine | Admitting: Internal Medicine

## 2023-09-30 ENCOUNTER — Encounter (HOSPITAL_COMMUNITY): Payer: Self-pay | Admitting: Internal Medicine

## 2023-09-30 ENCOUNTER — Ambulatory Visit (INDEPENDENT_AMBULATORY_CARE_PROVIDER_SITE_OTHER): Payer: Managed Care, Other (non HMO)

## 2023-09-30 VITALS — BP 126/88 | HR 59 | Wt 223.8 lb

## 2023-09-30 DIAGNOSIS — Z7901 Long term (current) use of anticoagulants: Secondary | ICD-10-CM

## 2023-09-30 DIAGNOSIS — Z79899 Other long term (current) drug therapy: Secondary | ICD-10-CM | POA: Insufficient documentation

## 2023-09-30 DIAGNOSIS — T826XXA Infection and inflammatory reaction due to cardiac valve prosthesis, initial encounter: Secondary | ICD-10-CM | POA: Diagnosis not present

## 2023-09-30 DIAGNOSIS — I5022 Chronic systolic (congestive) heart failure: Secondary | ICD-10-CM | POA: Insufficient documentation

## 2023-09-30 DIAGNOSIS — I484 Atypical atrial flutter: Secondary | ICD-10-CM | POA: Insufficient documentation

## 2023-09-30 DIAGNOSIS — I340 Nonrheumatic mitral (valve) insufficiency: Secondary | ICD-10-CM | POA: Insufficient documentation

## 2023-09-30 DIAGNOSIS — G4733 Obstructive sleep apnea (adult) (pediatric): Secondary | ICD-10-CM | POA: Diagnosis not present

## 2023-09-30 DIAGNOSIS — I4901 Ventricular fibrillation: Secondary | ICD-10-CM | POA: Diagnosis not present

## 2023-09-30 DIAGNOSIS — I2510 Atherosclerotic heart disease of native coronary artery without angina pectoris: Secondary | ICD-10-CM | POA: Insufficient documentation

## 2023-09-30 DIAGNOSIS — I110 Hypertensive heart disease with heart failure: Secondary | ICD-10-CM | POA: Diagnosis not present

## 2023-09-30 DIAGNOSIS — Z954 Presence of other heart-valve replacement: Secondary | ICD-10-CM

## 2023-09-30 DIAGNOSIS — Z8674 Personal history of sudden cardiac arrest: Secondary | ICD-10-CM | POA: Insufficient documentation

## 2023-09-30 DIAGNOSIS — I10 Essential (primary) hypertension: Secondary | ICD-10-CM

## 2023-09-30 DIAGNOSIS — Z952 Presence of prosthetic heart valve: Secondary | ICD-10-CM | POA: Insufficient documentation

## 2023-09-30 DIAGNOSIS — I38 Endocarditis, valve unspecified: Secondary | ICD-10-CM

## 2023-09-30 DIAGNOSIS — I890 Lymphedema, not elsewhere classified: Secondary | ICD-10-CM | POA: Diagnosis not present

## 2023-09-30 DIAGNOSIS — I4892 Unspecified atrial flutter: Secondary | ICD-10-CM

## 2023-09-30 LAB — POCT INR: INR: 2 (ref 2.0–3.0)

## 2023-09-30 MED ORDER — FUROSEMIDE 40 MG PO TABS: ORAL_TABLET | ORAL | 5 refills | Status: DC

## 2023-09-30 NOTE — Patient Instructions (Addendum)
Good to see you today!   No medication changes were made  Your physician has requested that you have an echocardiogram. Echocardiography is a painless test that uses sound waves to create images of your heart. It provides your doctor with information about the size and shape of your heart and how well your heart's chambers and valves are working. This procedure takes approximately one hour. There are no restrictions for this procedure. Please do NOT wear cologne, perfume, aftershave, or lotions (deodorant is allowed). Please arrive 15 minutes prior to your appointment time.  Please note: We ask at that you not bring children with you during ultrasound (echo/ vascular) testing. Due to room size and safety concerns, children are not allowed in the ultrasound rooms during exams. Our front office staff cannot provide observation of children in our lobby area while testing is being conducted. An adult accompanying a patient to their appointment will only be allowed in the ultrasound room at the discretion of the ultrasound technician under special circumstances. We apologize for any inconvenience.  Your physician recommends that you schedule a follow-up appointment in: 1 year(November 2025) Call office in September to schedule an appointment  If you have any questions or concerns before your next appointment please send [place] a message through [place] or call our office at [phone].    TO LEAVE A MESSAGE FOR THE NURSE SELECT OPTION 2, PLEASE LEAVE A MESSAGE INCLUDING: YOUR NAME DATE OF BIRTH CALL BACK NUMBER REASON FOR CALL**this is important as we prioritize the call backs  YOU WILL RECEIVE A CALL BACK THE SAME DAY AS LONG AS YOU CALL BEFORE 4:00 PM  At the [facility], you and your health needs are our priority. As part of our continuing mission to provide you with exceptional heart care, we have created designated Provider Care Teams. These Care Teams include your primary  Cardiologist (physician) and Advanced Practice Providers (APPs- Physician Assistants and Nurse Practitioners) who all work together to provide you with the care you need, when you need it.   You may see any of the following providers on your designated Care Team at your next follow up: Dr Arvilla Meres Dr Marca Ancona Dr. Dorthula Nettles Dr. Clearnce Hasten Amy Filbert Schilder, NP Robbie Lis, [place] [facility] [place], [place] Brynda Peon, NP [place] Lee, NP Karle Plumber, PharmD   Please be sure to bring in all your medications bottles to every appointment.    Thank you for choosing [facility]

## 2023-09-30 NOTE — Patient Instructions (Signed)
Take 2 tablets today then INCREASE TO 1.5 tablets daily except 2 tablets on Tuesday, Thursday and Saturday. Recheck INR in 5 weeks (normally 6 weeks). Coumadin Clinic [phone] or [phone]

## 2023-09-30 NOTE — Progress Notes (Addendum)
Patient ID: Oscar Brown, male   DOB: 1966/08/29, 57 y.o.   MRN: [id number]     [facility] Note     Cardiologist: Dr Gala Romney  PCP: Eric Form  HPI: Oscar Brown is a 57 y.o. male  with OSA and mitral regurgitation. 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 On POD #3  he had a sudden VF arrest with nearly an hour of resuscitation time. Follow-up transthoracic and transesophageal echocardiograms performed after the arrest revealed normal function of the mechanical prosthetic valve with severe dysfunction involving the inferior wall of the left ventricle EF35-40%.  Cardiac enzymes went quite high and he had some bradycardia and AV block, all suggestive of possible inferior wall myocardial infarction. However, left ventricular function completely normalized prior to hospital discharge. Plans for diagnostic cardiac catheterization were postponed because the patient developed acute non-oliguric renal failure with creatinine up to 7. Because the patient's rhythm and left ventricular systolic function recovered completely, it was felt that the patient did not need defibrillator implantation.  The patient was ultimately discharged from the hospital on the 18th postoperative day.    Underwent successful RFA of AFL 03/20/18 with Dr. Ladona Ridgel   Admitted 12/19 with LLE cellulitis. Started on empiric ABX. BCx positive for Streptococcus agalactiae. With mechanical MV, taken for TEE which showed highly mobile echo-density attached to the anterior portion of the sewing ring of the mechanical mitral valve measuring 12x5 mm consistent with prosthesis vegetation. Pt thought not to need surgical intervention. PICC was placed to continue 6 week course of IV ABX per ID. Also noted to have chronic DVT. Coumadin continued, no other change.   Had  TEE 12/13/2018 vegetation resolved. Valve working well.   Today he returns for HF follow up. We haven't seen him for 2 years. Continues to work full-time at [employer]. 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 No dyspnea, chest pain or palpitations. Continues to use lasix twice a week for lymphedema. Has seen [facility] for LLE lymphedema. Wears compression stockings. No bleeding issues with coumadin and aspirin.  Echo 02/23  EF 60-65%, normal functioning mitral valve prosthesis, RV okay  TEE 12/13/2018 LEFT VENTRICLE: EF = 55-60%. No regional wall motion abnormalities. LEFT ATRIUM: Dilated LEFT ATRIAL APPENDAGE: No thrombus.  AORTIC VALVE:  Trileaflet. No AI/AS. No vegetation. MITRAL VALVE:    Mechanical MV. Functioning well. No MR. Mild MS mean gradient . Previously seen vegetation no longer present  TRICUSPID VALVE: Normal. Mild TR. No vegetation. PULMONIC VALVE: Grossly normal. No vegetation. INTERATRIAL SEPTUM: No PFO or ASD.  DESCENDING AORTA: Mild plaque  TEE 11/03/18 There is a highly mobile echodensity attached to the anterior portion of the sewing ring of the mechanical mitral valve measuring 12 x 5 mm consistent with prosthesis vegetation.  Echo 02/20/18 EF ~50% inferior HK RV mild to moderately HK. Septal flattening.  Echo 2/17 EF 45-50% inferior HK. MV ok   Review of systems complete and found to be negative unless listed in HPI.   SH:  Social History   Socioeconomic History   Marital status: Divorced    Spouse name: Not on file   Number of children: 3   Years of education: Not on file   Highest education level: Not on file  Occupational History   Not on file  Tobacco Use   Smoking status: Former  Current packs/day: 0.00    Average packs/day: 0.1 packs/day for 9.0 years (0.9 ttl pk-yrs)    Types: Cigarettes    Start date: 09/06/2003    Quit date: 09/05/2012    Years since quitting: 11.0   Smokeless tobacco: Never  Vaping Use   Vaping  status: Never Used  Substance and Sexual Activity   Alcohol use: Yes    Alcohol/week: 0.0 standard drinks of alcohol    Comment: 1 beer once a week or special occasions   Drug use: No   Sexual activity: Yes  Other Topics Concern   Not on file  Social History Narrative   Not on file   Social Determinants of Health   Financial Resource Strain: Not on file  Food Insecurity: Not on file  Transportation Needs: Not on file  Physical Activity: Not on file  Stress: Not on file  Social Connections: Not on file  Intimate Partner Violence: Not on file   FH:  - Denies history of sudden cardiac death.  Past Medical History:  Diagnosis Date   Acute diastolic congestive heart failure (HCC) 09/21/2015   takes Lasix daily   Acute renal failure (HCC)    ATN s/p cardiac arrest   Atrial fibrillation (HCC)    Bee sting allergy    CAD (coronary artery disease)    cath 10-2015   Chronic venous hypertension due to deep vein thrombosis 12/14/2006   1993.  Complicated by chronic left lower extremity edema and occasional recurrent left lower extremity cellulitis    COPD (chronic obstructive pulmonary disease) (HCC)    DVT (deep venous thrombosis) (HCC) early 80's   left leg   HLD (hyperlipidemia)    Incidental lung nodule, less than or equal to 3mm 11/10/2015   3 mm nodule right lung   Lymphedema early 80's   Mitral regurgitation 10/06/2012   Obstructive sleep apnea 12/14/2006   AHI 19.2/hr, desaturation to 75% on Polysomnography 02/26/2005.  Uses 11 cm H2O nocturnal nasal CPAP with good symptom control.    Old myocardial infarction    OSA on CPAP    S/P minimally invasive mitral valve replacement with metallic valve 11/13/2015   33 mm Carbomedics Optiform bileaflet mechanical prosthesis placed via right mini-thoracotomy approach    Current Outpatient Medications  Medication Sig Dispense Refill   aspirin (ASPIR-81) 81 MG EC tablet Take 1 tablet (81 mg total) by mouth daily. Swallow whole.  30 tablet 12   EPINEPHrine 0.3 mg/0.3 mL IJ SOAJ injection as needed.     rosuvastatin (CRESTOR) 20 MG tablet Take 20 mg by mouth daily.     warfarin (COUMADIN) 5 MG tablet TAKE 1 AND 1/2 TO 2 TABLETS BY MOUTH DAILY AS DIRECTED BY COUMADIN CLINIC (Patient taking differently: TAKE 2 tablets 3 times per week  AS DIRECTED BY COUMADIN CLINIC) 160 tablet 0   furosemide (LASIX) 40 MG tablet Take 1 tablet twice a week on Mondays and Fridays. 9 tablet 5   No current facility-administered medications for this encounter.   Vitals:   09/30/23 0905  BP: 130/82  Pulse: (!) 59  SpO2: 100%  Weight: 101.5 kg (223 lb 12.8 oz)      Wt Readings from Last 3 Encounters:  09/30/23 101.5 kg (223 lb 12.8 oz)  03/24/23 101.2 kg (223 lb 3.2 oz)  01/11/23 102.1 kg (225 lb)    PHYSICAL EXAM: General:  Well appearing. No resp difficulty HEENT: normal Neck: supple. no JVD. Carotids 2+ bilat; no bruits. No lymphadenopathy  or thryomegaly appreciated. Cor: PMI nondisplaced. Regular rate & rhythm. No rubs, gallops or murmurs. Lungs: clear Abdomen: soft, nontender, nondistended. No hepatosplenomegaly. No bruits or masses. Good bowel sounds. Extremities: no cyanosis, clubbing, rash, edema Neuro: alert & orientedx3, cranial nerves grossly intact. moves all 4 extremities w/o difficulty. Affect pleasant   ECG: Sinus bradycardia 54 bpm   ASSESSMENT & PLAN:  1. Recurrent A flutter, atypical - s/p DCCV 12/23/17. - s/p Aflutter ablation 03/20/18  - On coumadin for MVR. - in NSR today. No evidence recurrence - No bleeding issues.  Denies bleeding.  2. Mitral regurgitation s/p mechanical MVR in 12/16 - Normal EF and stable without vegetation on TEE 12/13/2018. - Echo 2/23: EF 60-65%, normal functioning MV prosthesis, RV okay - Continue coumadin per coumadin clinic. INR goal 2.5-3.5. - Has resultant RV strain due longstanding MV disease - Reinforced need for SBE prophylaxis - Due for repeat echo  3. 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 - Completed gentamicin therapy 11/17/18 and IV PCN 12/17/2018 - Saw ID 01/04/19 - F/u TEE 1/20 no vegetation. Valve functioning well.  4. H/o VT/VF Arrest - No change.  5. Chronic DVT - Continue coumadin as above. No change.  6. Lymphedema in left leg - Continue lasix 40 mg twice weekly (refilled). Stable.  - Continue compression stockings.   7. HTN - Blood pressure well controlled. Continue current regimen. 8. CAD - Cath 12/16 (pre-MVR) distal LCx 45% - No s/s angina - Continue ASA/statin  Follow-up: 1 year  Arvilla Meres, MD  10:37 AM

## 2023-10-11 ENCOUNTER — Ambulatory Visit (HOSPITAL_COMMUNITY): Payer: Managed Care, Other (non HMO) | Attending: Internal Medicine | Admitting: Physical Therapy

## 2023-10-11 ENCOUNTER — Other Ambulatory Visit: Payer: Self-pay

## 2023-10-11 DIAGNOSIS — I890 Lymphedema, not elsewhere classified: Secondary | ICD-10-CM | POA: Insufficient documentation

## 2023-10-11 NOTE — Therapy (Signed)
OUTPATIENT PHYSICAL THERAPY LYMPHEDEMA EVALUATION  Patient Name: Oscar Brown MRN: [id number] DOB:[birth year], 57 y.o., male Today's Date: 10/11/2023  END OF SESSION:  PT End of Session - 10/11/23 0852     Visit Number 1    Number of Visits 7    Date for PT Re-Evaluation 11/01/23   PT unable to start right away   Authorization Type cigna    Authorization - Visit Number 1    Authorization - Number of Visits 7    Progress Note Due on Visit 7    PT Start Time 0805    PT Stop Time 0845    PT Time Calculation (min) 40 min             Past Medical History:  Diagnosis Date   Acute diastolic congestive heart failure (HCC) 09/21/2015   takes Lasix daily   Acute renal failure (HCC)    ATN s/p cardiac arrest   Atrial fibrillation (HCC)    Bee sting allergy    CAD (coronary artery disease)    cath 10-2015   Chronic venous hypertension due to deep vein thrombosis 12/14/2006   1993.  Complicated by chronic left lower extremity edema and occasional recurrent left lower extremity cellulitis    COPD (chronic obstructive pulmonary disease) (HCC)    DVT (deep venous thrombosis) (HCC) early 80's   left leg   HLD (hyperlipidemia)    Incidental lung nodule, less than or equal to 3mm 11/10/2015   3 mm nodule right lung   Lymphedema early 80's   Mitral regurgitation 10/06/2012   Obstructive sleep apnea 12/14/2006   AHI 19.2/hr, desaturation to 75% on Polysomnography 02/26/2005.  Uses 11 cm H2O nocturnal nasal CPAP with good symptom control.    Old myocardial infarction    OSA on CPAP    S/P minimally invasive mitral valve replacement with metallic valve 11/13/2015   33 mm Carbomedics Optiform bileaflet mechanical prosthesis placed via right mini-thoracotomy approach   Past Surgical History:  Procedure Laterality Date   A-FLUTTER ABLATION N/A 03/20/2018   Procedure: A-FLUTTER ABLATION;  Surgeon: Marinus Maw, MD;  Location: [facility] INVASIVE CV LAB;  Service: Cardiovascular;   Laterality: N/A;   CARDIAC CATHETERIZATION N/A 11/03/2015   Procedure: Right/Left Heart Cath and Coronary Angiography;  Surgeon: Marykay Lex, MD;  Location: [facility] INVASIVE CV LAB;  Service: Cardiovascular;  Laterality: N/A;   CARDIOVERSION N/A 12/23/2017   Procedure: CARDIOVERSION;  Surgeon: Elease Hashimoto Deloris Ping, MD;  Location: [facility] ENDOSCOPY;  Service: Cardiovascular;  Laterality: N/A;   MITRAL VALVE REPLACEMENT Right 11/13/2015   Procedure: MINIMALLY INVASIVE MITRAL VALVE (MV) REPLACEMENT;  Surgeon: Purcell Nails, MD;  Location: [facility] OR;  Service: Open Heart Surgery;  Laterality: Right;   MULTIPLE EXTRACTIONS WITH ALVEOLOPLASTY N/A 11/07/2015   Procedure: Extraction of tooth #'s 1,2,8,9,12,15,16,17, 32 with alveoloplasty and gross debridement of remaining teeth.;  Surgeon: Charlynne Pander, DDS;  Location: [facility] OR;  Service: Oral Surgery;  Laterality: N/A;   TEE WITHOUT CARDIOVERSION N/A 10/16/2015   Procedure: TRANSESOPHAGEAL ECHOCARDIOGRAM (TEE);  Surgeon: Chilton Si, MD;  Location[facility] ENDOSCOPY;  Service: Cardiovascular;  Laterality: N/A;   TEE WITHOUT CARDIOVERSION N/A 11/13/2015   Procedure: TRANSESOPHAGEAL ECHOCARDIOGRAM (TEE);  Surgeon: Purcell Nails, MD;  Location: [facility] OR;  Service: Open Heart Surgery;  Laterality: N/A;   TEE WITHOUT CARDIOVERSION N/A 11/03/2018   Procedure: TRANSESOPHAGEAL ECHOCARDIOGRAM (TEE);  Surgeon: Lars Masson, MD;  Location: [facility] ENDOSCOPY;  Service: Cardiovascular;  Laterality: N/A;  TEE WITHOUT CARDIOVERSION N/A 12/13/2018   Procedure: TRANSESOPHAGEAL ECHOCARDIOGRAM (TEE);  Surgeon: Dolores Patty, MD;  Location: [facility] ENDOSCOPY;  Service: Cardiovascular;  Laterality: N/A;   Patient Active Problem List   Diagnosis Date Noted   Nonischemic cardiomyopathy (HCC) 12/31/2022   Allergic reaction to bee sting 01/06/2022   History of thromboembolism of vein 01/06/2022   Hypercoagulable state (HCC) 01/06/2022   Personal history of other diseases of the  circulatory system 01/06/2022   Hyperlipidemia 06/21/2019   Medication monitoring encounter 12/06/2018   Acute bacterial endocarditis    H/O mitral valve replacement with mechanical valve    Essential hypertension    Old myocardial infarction 06/15/2018   Lymphedema 06/15/2018   Presence of prosthetic heart valve 06/15/2018   Atherosclerotic heart disease of native coronary artery without angina pectoris 12/23/2016   Long term (current) use of anticoagulants [Z79.01] 12/05/2015   Atrial flutter (HCC)    History of cardiac arrest    Incidental lung nodule, less than or equal to 3mm 11/10/2015   Tobacco use disorder 06/05/2015   Preventative health care 10/06/2012   Obesity, Class I, BMI 30-34.9 12/14/2006   OSA on CPAP 12/14/2006   Chronic venous insufficiency 12/14/2006    PCP: Martha Clan  REFERRING PROVIDER: Cleatis Polka., MD  REFERRING DIAG:  Diagnosis  I89.0 (ICD-10-CM) - Lymphedema, not elsewhere classified    THERAPY DIAG:  Diagnosis  I89.0 (ICD-10-CM) - Lymphedema, not elsewhere classified    Rationale for Evaluation and Treatment: Rehabilitation  ONSET DATE: early 1980's  SUBJECTIVE:                                                                                                                                                                                           SUBJECTIVE STATEMENT: PT states that he had his lymphedema under control but he had an acute exacerbation causing cellulitis three months ago.  He is wrapping his leg at night and he wears compression garments.  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  He does not do self manual but when he gets home he does elevate.    PERTINENT HISTORY: Lymphedema, noted varicosities   PAIN:  Are you having pain? No PRECAUTIONS: Other: cellulitis   WEIGHT BEARING RESTRICTIONS: No  FALLS:  Has patient fallen in last 6 months? No   PRIOR LEVEL OF FUNCTION: Independent  PATIENT  GOALS: to make sure he is wrapping himself properly, possibly get a compression pump.     OBJECTIVE: Note: Objective measures were completed at Evaluation unless otherwise noted.  COGNITION: Overall cognitive status:  Within functional limits for tasks assessed   PALPATION: Edema noted in LT LE   OBSERVATIONS / OTHER ASSESSMENTS: noted varicosities    LE LANDMARK RIGHT eval  At groin   30 cm proximal to suprapatella   20 cm proximal to suprapatella   10 cm proximal to suprapatella   At midpatella / popliteal crease   30 cm proximal to floor at lateral plantar foot 35.5  20 cm proximal to floor at lateral plantar foot 27.8   10 cm proximal to floor at lateral plantar foot 27.5  Circumference of ankle/heel 36.8  5 cm proximal to 1st MTP joint 24.2  Across MTP joint 26.2  Around proximal great toe 9.8  (Blank rows = not tested)  LE LANDMARK LEFT eval  At groin   30 cm proximal to suprapatella   20 cm proximal to suprapatella   10 cm proximal to suprapatella   At midpatella / popliteal crease   30 cm proximal to floor at lateral plantar foot 35.8  20 cm proximal to floor at lateral plantar foot 29.7  10 cm proximal to floor at lateral plantar foot 29.3  Circumference of ankle/heel 36.5  5 cm proximal to 1st MTP joint 25  Across MTP joint 24.5  Around proximal great toe 10.8  (Blank rows = not tested)   TODAY'S TREATMENT:                                                                                                                              DATE:   Treatment today consisted of education.  Pt has 12cm short stretch.  Therapist gave pt the list of compression bandages that he will need for next session.  Foam was cut and given to pt as well as the self bandaging sheet as pt is bandaging himself at home but is unsure if he is doing it correctly.  Pt has never been instructed in self manual techniques.  Therapist gave pt self manual technique sheet but will go over next  treatment.  Pt is very active and does not feel that he needs the exercise sheet at this time.  PT obtains his compression garments from [facility] and knows how to don and doff.  Pt has never had a compression pump and will benefit from one at this time.    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 ma;e who was seen today for physical therapy evaluation and treatment for Lt LE lymphedema.  Pt has been bandaging himself for years but is unsure if he is using the right bandages or if he is bandaging himself correctly.  He has never used foam when bandaging and does not have  a compression pump.  The  Therapist gave pt the list of compression bandages that he will need for next session.  Foam was cut and given to pt as well as the self bandaging sheet . Pt has never been instructed in self manual techniques.  Therapist gave pt self manual technique sheet but will go over next treatment.  Pt is very active and does not feel that he needs the exercise sheet at this time.  PT obtains his compression garments from [facility] and knows how to don and doff.  Pt has never had a compression pump and will benefit from one at this time.  Mr. Nivar sill benefit from education on self manual, self bandaging techniques to return his measurements to near normal as well as total decongestive techniques if needed.     OBJECTIVE IMPAIRMENTS: increased edema.    REHAB POTENTIAL: Good  CLINICAL DECISION MAKING: Evolving/moderate complexity  EVALUATION COMPLEXITY: Moderate  GOALS: Goals reviewed with patient? No  SHORT TERM GOALS: Target date: 10/21/23  Pt to be I in self manual techniques to decrease edema in  Lt LE Baseline: Goal status: INITIAL  2.  Pt to be I in self bandaging techniques using foam and correct bandages.  Baseline:  Goal status: INITIAL  3.  Pt to have lost 1-2 cm from original measurements.  Baseline:  Goal status: INITIAL   LONG TERM GOALS: Target date: 11/04/23  PT to have lost 2 + cm from original measurements  Baseline:  Goal status: INITIAL  2.  Pt to have no questions on self care for discharge.  Baseline:  Goal status: INITIAL   PLAN:  PT FREQUENCY: 3x/week  PT DURATION: 2 weeks  PLANNED INTERVENTIONS: 97110-Therapeutic exercises, 97535- Self Care, 86578- Manual therapy, Manual lymph drainage, and Compression bandaging  PLAN FOR NEXT SESSION: Education on self bandaging, self manual begin techniques is pt able as pt has a full time job and lives in [place].   Virgina Organ, PT CLT [phone]  10/11/2023, 9:16 AM

## 2023-10-18 ENCOUNTER — Encounter (HOSPITAL_COMMUNITY): Payer: Managed Care, Other (non HMO) | Admitting: Physical Therapy

## 2023-10-25 ENCOUNTER — Encounter (HOSPITAL_COMMUNITY): Payer: Managed Care, Other (non HMO) | Admitting: Physical Therapy

## 2023-11-04 ENCOUNTER — Ambulatory Visit (INDEPENDENT_AMBULATORY_CARE_PROVIDER_SITE_OTHER): Payer: Managed Care, Other (non HMO)

## 2023-11-04 ENCOUNTER — Ambulatory Visit (HOSPITAL_COMMUNITY)
Admission: RE | Admit: 2023-11-04 | Discharge: 2023-11-04 | Disposition: A | Discharge status: Home / Self Care | Payer: Managed Care, Other (non HMO) | Source: Ambulatory Visit | Attending: Cardiology | Admitting: Cardiology

## 2023-11-04 DIAGNOSIS — I110 Hypertensive heart disease with heart failure: Secondary | ICD-10-CM | POA: Insufficient documentation

## 2023-11-04 DIAGNOSIS — Z87891 Personal history of nicotine dependence: Secondary | ICD-10-CM | POA: Insufficient documentation

## 2023-11-04 DIAGNOSIS — I509 Heart failure, unspecified: Secondary | ICD-10-CM | POA: Insufficient documentation

## 2023-11-04 DIAGNOSIS — I5022 Chronic systolic (congestive) heart failure: Secondary | ICD-10-CM

## 2023-11-04 DIAGNOSIS — Z7901 Long term (current) use of anticoagulants: Secondary | ICD-10-CM

## 2023-11-04 DIAGNOSIS — G4730 Sleep apnea, unspecified: Secondary | ICD-10-CM | POA: Insufficient documentation

## 2023-11-04 LAB — ECHOCARDIOGRAM COMPLETE
Area-P 1/2: 1.76 cm2
MV VTI: 1.39 cm2
S' Lateral: 2.9 cm

## 2023-11-04 LAB — POCT INR: INR: 1.8 — AB (ref 2.0–3.0)

## 2023-11-04 NOTE — Progress Notes (Signed)
  Echocardiogram 2D Echocardiogram has been performed.  Oscar Brown 11/04/2023, 9:47 AM

## 2023-11-04 NOTE — Patient Instructions (Signed)
Take 2.5 tablets today and Saturday then continue 1.5 tablets daily except 2 tablets on Tuesday, Thursday and Saturday. Recheck INR in 4 weeks (normally 6 weeks). Coumadin Clinic [phone] or [phone]

## 2023-11-06 ENCOUNTER — Other Ambulatory Visit: Payer: Self-pay | Admitting: Cardiovascular Disease

## 2023-11-06 DIAGNOSIS — Z952 Presence of prosthetic heart valve: Secondary | ICD-10-CM

## 2023-11-09 ENCOUNTER — Ambulatory Visit (INDEPENDENT_AMBULATORY_CARE_PROVIDER_SITE_OTHER): Payer: Managed Care, Other (non HMO) | Admitting: Podiatry

## 2023-11-09 ENCOUNTER — Encounter: Payer: Self-pay | Admitting: Podiatry

## 2023-11-09 VITALS — Ht 66.0 in | Wt 223.8 lb

## 2023-11-09 DIAGNOSIS — L84 Corns and callosities: Secondary | ICD-10-CM | POA: Diagnosis not present

## 2023-11-09 DIAGNOSIS — D6859 Other primary thrombophilia: Secondary | ICD-10-CM | POA: Diagnosis not present

## 2023-11-09 DIAGNOSIS — B351 Tinea unguium: Secondary | ICD-10-CM | POA: Diagnosis not present

## 2023-11-09 DIAGNOSIS — M79674 Pain in right toe(s): Secondary | ICD-10-CM

## 2023-11-09 DIAGNOSIS — M79675 Pain in left toe(s): Secondary | ICD-10-CM | POA: Diagnosis not present

## 2023-11-09 DIAGNOSIS — Z860101 Personal history of adenomatous and serrated colon polyps: Secondary | ICD-10-CM | POA: Insufficient documentation

## 2023-11-09 DIAGNOSIS — T63461A Toxic effect of venom of wasps, accidental (unintentional), initial encounter: Secondary | ICD-10-CM | POA: Insufficient documentation

## 2023-11-09 DIAGNOSIS — R609 Edema, unspecified: Secondary | ICD-10-CM | POA: Insufficient documentation

## 2023-11-09 DIAGNOSIS — Z8679 Personal history of other diseases of the circulatory system: Secondary | ICD-10-CM | POA: Insufficient documentation

## 2023-11-09 DIAGNOSIS — R590 Localized enlarged lymph nodes: Secondary | ICD-10-CM | POA: Insufficient documentation

## 2023-11-09 NOTE — Progress Notes (Signed)
  Subjective:  Patient ID: Oscar Brown, male    DOB: Jul 28, 1966,  MRN: [id number]  Oscar Brown presents to clinic today for at risk foot care with h/o clotting disorder and callus(es) both feet and painful thick toenails that are difficult to trim. Painful toenails interfere with ambulation. Aggravating factors include wearing enclosed shoe gear. Pain is relieved with periodic professional debridement. Painful calluses are aggravated when weightbearing with and without shoegear. Pain is relieved with periodic professional debridement.  Chief Complaint  Patient presents with   Nail Problem    Pt is here for RFC, not a diabetic PCP is Dr Clelia Croft and LOV was earlier this year.   New problem(s): None.   PCP is Cleatis Polka., MD. Theron Arista 09/23/2023.  Allergies  Allergen Reactions   Bee Venom Other (See Comments)   Iohexol Hives, Itching and Other (See Comments)    Patient needs 13 hour prep per Dr. Grace Isaac, after cath he broke out in hives and had itching    Review of Systems: Negative except as noted in the HPI.  Objective: No changes noted in today's physical examination. There were no vitals filed for this visit. Oscar Brown is a pleasant 57 y.o. male WD, WN in NAD. AAO x 3.  Vascular Examination: Capillary refill time <3 seconds b/l LE. Palpable pedal pulses b/l LE. Digital hair present b/l. No pedal edema b/l. Skin temperature gradient WNL b/l. Patient wearing compression hose on today's visit. Evidence of chronic venous insufficiency left lower extremity..  Dermatological Examination: Pedal skin with normal turgor, texture and tone b/l. No open wounds. No interdigital macerations b/l. Hyperkeratotic lesion(s) L hallux, submet head 1 right foot, submet head 5 right foot, and sub 5th met base left foot.  No erythema, no edema, no drainage, no fluctuance.  Toenails 1-5 b/l elongated, discolored, dystrophic, thickened, crumbly with subungual debris and tenderness to dorsal  palpation.  Neurological Examination: Protective sensation intact with 10 gram monofilament b/l LE. Vibratory sensation intact b/l LE.   Musculoskeletal Examination: Muscle strength 5/5 to all lower extremity muscle groups bilaterally. Plantarflexed metatarsal(s) 1st metatarsal head right foot and 5th metatarsal head right foot. Pes planus deformity noted bilateral LE.  Assessment/Plan: 1. Pain due to onychomycosis of toenails of both feet   2. Callus   3. Hypercoagulable state (HCC)     -Patient was evaluated today. All questions/concerns addressed on today's visit. -Continue supportive shoe gear daily. -Toenails 1-5 b/l were debrided in length and girth with sterile nail nippers and dremel without iatrogenic bleeding.  -Callus(es) L hallux, submet head 1 right foot, submet head 5 right foot, and sub 5th met base left lower extremity pared utilizing sterile scalpel blade without complication or incident. Total number debrided =4. -Patient/POA to call should there be question/concern in the interim.   Return in about 3 months (around 02/07/2024).  Lindy Breech, DPM      [place] LOCATION: [address], [place] [address]                   Office [phone]   [facility] LOCATION: [address] [place], [place] [address] Office [phone]

## 2023-11-14 ENCOUNTER — Encounter: Payer: Self-pay | Admitting: Podiatry

## 2023-11-15 ENCOUNTER — Telehealth (HOSPITAL_COMMUNITY): Payer: Self-pay | Admitting: Physical Therapy

## 2023-11-15 NOTE — Telephone Encounter (Signed)
Called pt to discuss return for lymphedema appointments.  Pt reported he was on vacation and not sure when he may be back or when he may want to return to therapy.  Explained we have a wait list and we would place him back on it as he was not ready to commit to treatment at this time.  Pt verbalized understanding.   Lurena Nida, PTA/CLT [facility] Ph: [phone]

## 2023-11-28 ENCOUNTER — Telehealth: Payer: Self-pay | Admitting: Cardiovascular Disease

## 2023-11-28 NOTE — Telephone Encounter (Signed)
I spoke to patient who started Azithromycin 12/26-12/30 and discontinued his Coumadin.  I had him start Coumadin today and we will see him 1/3

## 2023-11-28 NOTE — Telephone Encounter (Signed)
Pt c/o medication issue:  1. Name of Medication:   warfarin (COUMADIN) 5 MG tablet    2. How are you currently taking this medication (dosage and times per day)? TAKE ONE AND 1/2 TO TWO TABLETS BY MOUTH DAILY AS DIRECTED BY COUMADIN CLINIC   3. Are you having a reaction (difficulty breathing--STAT)? No  4. What is your medication issue? Pt states that he has taken his last antibiotic today and would like to know when to start back taking Coumadin. Please advise

## 2023-12-02 ENCOUNTER — Ambulatory Visit: Payer: Managed Care, Other (non HMO) | Attending: Cardiovascular Disease

## 2023-12-02 DIAGNOSIS — Z7901 Long term (current) use of anticoagulants: Secondary | ICD-10-CM | POA: Diagnosis not present

## 2023-12-02 LAB — POCT INR: INR: 1.2 — AB (ref 2.0–3.0)

## 2023-12-02 NOTE — Patient Instructions (Signed)
 Take 2.5 tablets today and 3 tablets Saturday then continue 1.5 tablets daily except 2 tablets on Tuesday, Thursday and Saturday. Recheck INR in 4 weeks (normally 6 weeks). [facility] [phone] or [phone]

## 2023-12-30 ENCOUNTER — Ambulatory Visit: Payer: Managed Care, Other (non HMO) | Attending: Cardiovascular Disease

## 2023-12-30 DIAGNOSIS — Z7901 Long term (current) use of anticoagulants: Secondary | ICD-10-CM

## 2023-12-30 LAB — POCT INR: INR: 2.4 (ref 2.0–3.0)

## 2023-12-30 NOTE — Patient Instructions (Signed)
Take 2 tablets today  then continue 1.5 tablets daily except 2 tablets on Tuesday, Thursday and Saturday. Recheck INR in 4 weeks (normally 6 weeks). Cou[facility]in Clinic [phone] or [phone]

## 2024-01-16 NOTE — Progress Notes (Unsigned)
PATIENT: Oscar Brown DOB: [birth year]  REASON FOR VISIT: Follow up for OSA on CPAP HISTORY FROM: Patient PRIMARY NEUROLOGIST: Dr. Frances Brown  HISTORY OF PRESENT ILLNESS: Today 01/17/24 Here today for CPAP follow up. Doing great with CPAP. Using nasal pillow mask. No issues. No cardiac issues. Remains on coumadin.  Has 100% CPAP compliance.  AHI 0.2, leak 6.7.  His machine will be 58 years old neck summer.  No issues with equipment thus far.  He continues to work at [employer]. He has good energy, sleeps well. ESS 0.   Update 01/11/23 SS: Here today for CPAP follow-up, review of data 12/11/22-01/09/23 shows 100% compliance greater than 4 hours 30/30 days.  Average usage 7 hours 27.  Minimum pressure 7 pressure 14 cmH2O.  EPR off.  95th percentile pressure 11.6, maximum pressure 7.  Leak 7.5, AHI 0.4. had HST in 2021, Set up date was 05/19/20. He has mechanical heart valve, on coumadin. ESS 0. He had a bout of cellulitis to left leg last month. He loves his CPAP. Feels well rested in the day, has good energy. Use nasal mask.  No new issues.  01/06/22 SS: Oscar Brown here today for follow-up with history of OSA on CPAP. HST May 2021, showed moderate OSA.  Is on AutoPap.  Review of CPAP download indicates overall excellent compliance, greater than 4 hours 30/30 days.  Apnea is well treated at 0.5. He loves his CPAP, not tired during the day, isn't falling asleep during the day. Using nasal mask. No problems with equipment.     HISTORY Copied Dr. Teofilo Brown Note 07/29/2020: Oscar Brown is a 58 year old right-handed gentleman with an underlying medical history of mitral regurgitation with status post mitral valve replacement in 2016, on Coumadin, hx of Vfib arrest, Hx of recurrent Aflutter, hyperlipidemia, history of DVT, COPD, history of atrial fibrillation, history of lymphedema, right lower extremity cellulitis, and obesity, who Presents for follow-up consultation of his obstructive sleep apnea.  Recent  home sleep testing and starting AutoPap therapy.  Patient is unaccompanied today.  I first met him at the request of his primary care physician, on 03/11/2020, at which time the patient reported a prior diagnosis of obstructive sleep apnea and he was on CPAP therapy. He was not fully compliant with treatment at that time and needed new equipment and new supplies. He was eligible for new equipment.  He was advised to proceed with sleep testing, he had a home sleep test on 04/14/2020 which indicated moderate obstructive sleep apnea with an AHI of 26.6/h, O2 nadir of 84%.  He was advised to proceed with home AutoPap therapy.  His set up date with the new equipment was 05/27/2020.   Today, 07/29/2020: I reviewed his AutoPap compliance data from 06/28/2020 through 07/27/2020, at which time he used his AutoPap every night with percent use days greater than 4 hours at 100%, indicating superb compliance with an average usage of 5 hours and 42 minutes, residual AHI at goal at 0.7/h, average pressure for the 95th percentile at 11.8 cm with a range of 7 to 14 cm, leak acceptable with a 95th percentile at 10.7 L/min.  He reports doing well, he has adjusted well to AutoPap therapy and is fully compliant with it.  He feels improved, particularly with regards to his daytime energy and daytime somnolence.  He is quite pleased with how he is doing.  He is using a nasal mask, similar to his previous mask style.  He is up-to-date with  his supplies and very motivated to continue with treatment.  He has been sleeping an average of 6 hours, sometimes as long as 7 hours.    The patient's allergies, current medications, family history, past medical history, past social history, past surgical history and problem list were reviewed and updated as appropriate.    Previously:    03/11/20: (He) was previously diagnosed with obstructive sleep apnea and placed on CPAP therapy.  Prior sleep study results are not available for my review today.  I  reviewed your office records, phone note from 02/25/2020 as well as telemedicine note from 06/21/2019.  His Epworth sleepiness score is 0/24. A CPAP compliance download was reviewed today, from 02/10/2020 through 03/10/2020, which is a total of 30 days, during which time he used his machine 25 days with percent used days greater than 4 hours at 20% only, indicating significantly suboptimal compliance with an average usage of 2 hours and 51 minutes, residual AHI 0.7/h, leak very high with a 95th percentile at 54.2 L/min on a pressure of 11 cm.  His DME company is [employer].  He has not received supplies in quite some time, uses a wide Mirage Fx nasal mask.  He reports that he is trying to keep his CPAP on all night but it is uncomfortable as the headgear is loose and he has trouble keeping the mask on.  He has benefited from treatment and would like to get back on the machine with new supplies and an updated machine as soon as possible.  He has a variable sleep schedule given that he has 2 jobs.  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  Generally, he may be in bed between 1230 and 1 AM, rise time is generally around 7 AM.  He does have to go to the bathroom at least once per average night.  He lives with his 13 year old son who is in college.  He has 2 older daughters.  He is separated from his second wife.  Prior sleep study testing was over 10 years ago at [facility] he recalls.  He has an S9 machine from [employer].  His weight fluctuates a little bit, less in the summertime, a little more in the wintertime.  He does not drink any caffeine and tries to hydrate well with water.  He quit smoking some 5, nearly 6 years ago and drinks alcohol very occasionally.  He has a family history of sleep apnea, one of his daughters has a CPAP machine, 2 sisters have sleep apnea and his younger brother is in the process of being evaluated for sleep apnea as I understand.  REVIEW OF  SYSTEMS: Out of a complete 14 system review of symptoms, the patient complains only of the following symptoms, and all other reviewed systems are negative.  See HPI  ALLERGIES: Allergies  Allergen Reactions   Bee Venom Other (See Comments)   Iohexol Hives, Itching and Other (See Comments)    Patient needs 13 hour prep per Dr. Grace Isaac, after cath he broke out in hives and had itching     HOME MEDICATIONS: Outpatient Medications Prior to Visit  Medication Sig Dispense Refill   aspirin (ASPIR-81) 81 MG EC tablet Take 1 tablet (81 mg total) by mouth daily. Swallow whole. 30 tablet 12   Elastic Bandages & Supports (MEDICAL COMPRESSION STOCKINGS) MISC by Does not apply route.     EPINEPHrine 0.3 mg/0.3 mL IJ SOAJ injection as needed.  furosemide (LASIX) 40 MG tablet Take 1 tablet twice a week on Mondays and Fridays. 9 tablet 5   rosuvastatin (CRESTOR) 20 MG tablet Take 20 mg by mouth daily.     warfarin (COUMADIN) 5 MG tablet TAKE ONE AND 1/2 TO TWO TABLETS BY MOUTH DAILY AS DIRECTED BY COUMADIN CLINIC 160 tablet 0   No facility-administered medications prior to visit.    PAST MEDICAL HISTORY: Past Medical History:  Diagnosis Date   Acute diastolic congestive heart failure (HCC) 09/21/2015   takes Lasix daily   Acute renal failure (HCC)    ATN s/p cardiac arrest   Atrial fibrillation (HCC)    Bee sting allergy    CAD (coronary artery disease)    cath 10-2015   Chronic venous hypertension due to deep vein thrombosis 12/14/2006   1993.  Complicated by chronic left lower extremity edema and occasional recurrent left lower extremity cellulitis    COPD (chronic obstructive pulmonary disease) (HCC)    DVT (deep venous thrombosis) (HCC) early 80's   left leg   HLD (hyperlipidemia)    Incidental lung nodule, less than or equal to 3mm 11/10/2015   3 mm nodule right lung   Lymphedema early 80's   Mitral regurgitation 10/06/2012   Obstructive sleep apnea 12/14/2006   AHI 19.2/hr,  desaturation to 75% on Polysomnography 02/26/2005.  Uses 11 cm H2O nocturnal nasal CPAP with good symptom control.    Old myocardial infarction    OSA on CPAP    S/P minimally invasive mitral valve replacement with metallic valve 11/13/2015   33 mm Carbomedics Optiform bileaflet mechanical prosthesis placed via right mini-thoracotomy approach    PAST SURGICAL HISTORY: Past Surgical History:  Procedure Laterality Date   A-FLUTTER ABLATION N/A 03/20/2018   Procedure: A-FLUTTER ABLATION;  Surgeon: Marinus Maw, MD;  Location: [facility] INVASIVE CV LAB;  Service: Cardiovascular;  Laterality: N/A;   CARDIAC CATHETERIZATION N/A 11/03/2015   Procedure: Right/Left Heart Cath and Coronary Angiography;  Surgeon: Marykay Lex, MD;  Location: [facility] INVASIVE CV LAB;  Service: Cardiovascular;  Laterality: N/A;   CARDIOVERSION N/A 12/23/2017   Procedure: CARDIOVERSION;  Surgeon: Elease Hashimoto Deloris Ping, MD;  Location: [facility] ENDOSCOPY;  Service: Cardiovascular;  Laterality: N/A;   COLONOSCOPY W/ BIOPSIES     2024   MITRAL VALVE REPLACEMENT Right 11/13/2015   Procedure: MINIMALLY INVASIVE MITRAL VALVE (MV) REPLACEMENT;  Surgeon: Purcell Nails, MD;  Location: [facility] OR;  Service: Open Heart Surgery;  Laterality: Right;   MULTIPLE EXTRACTIONS WITH ALVEOLOPLASTY N/A 11/07/2015   Procedure: Extraction of tooth #'s 1,2,8,9,12,15,16,17, 32 with alveoloplasty and gross debridement of remaining teeth.;  Surgeon: Charlynne Pander, DDS;  Location: [facility] OR;  Service: Oral Surgery;  Laterality: N/A;   TEE WITHOUT CARDIOVERSION N/A 10/16/2015   Procedure: TRANSESOPHAGEAL ECHOCARDIOGRAM (TEE);  Surgeon: Chilton Si, MD;  Location: [facility] ENDOSCOPY;  Service: Cardiovascular;  Laterality: N/A;   TEE WITHOUT CARDIOVERSION N/A 11/13/2015   Procedure: TRANSESOPHAGEAL ECHOCARDIOGRAM (TEE);  Surgeon: Purcell Nails, MD;  Location: [facility] OR;  Service: Open Heart Surgery;  Laterality: N/A;   TEE WITHOUT CARDIOVERSION N/A 11/03/2018   Procedure:  TRANSESOPHAGEAL ECHOCARDIOGRAM (TEE);  Surgeon: Lars Masson, MD;  Location: [facility] ENDOSCOPY;  Service: Cardiovascular;  Laterality: N/A;   TEE WITHOUT CARDIOVERSION N/A 12/13/2018   Procedure: TRANSESOPHAGEAL ECHOCARDIOGRAM (TEE);  Surgeon: Dolores Patty, MD;  Location: [facility] ENDOSCOPY;  Service: Cardiovascular;  Laterality: N/A;    FAMILY HISTORY: Family History  Problem Relation Age of Onset  Cancer Mother        type unknown but had a colostomy bag   COPD Mother    Hypertension Mother    Other Father        bone disease   Diabetes Father    Hypertension Father    Diabetes Paternal Aunt     SOCIAL HISTORY: Social History   Socioeconomic History   Marital status: Divorced    Spouse name: Not on file   Number of children: 3   Years of education: Not on file   Highest education level: Not on file  Occupational History   Not on file  Tobacco Use   Smoking status: Former    Current packs/day: 0.00    Average packs/day: 0.1 packs/day for 9.0 years (0.9 ttl pk-yrs)    Types: Cigarettes    Start date: 09/06/2003    Quit date: 09/05/2012    Years since quitting: 11.3   Smokeless tobacco: Never  Vaping Use   Vaping status: Never Used  Substance and Sexual Activity   Alcohol use: Yes    Alcohol/week: 0.0 standard drinks of alcohol    Comment: 1 beer once a week or special occasions   Drug use: No   Sexual activity: Yes  Other Topics Concern   Not on file  Social History Narrative   Not on file   Social Drivers of Health   Financial Resource Strain: Not on file  Food Insecurity: Not on file  Transportation Needs: Not on file  Physical Activity: Not on file  Stress: Not on file  Social Connections: Not on file  Intimate Partner Violence: Not on file   PHYSICAL EXAM  Vitals:   01/17/24 0832  BP: 129/75  Pulse: 68  Weight: 221 lb (100.2 kg)  Height: 5\' 7"  (1.702 m)    Body mass index is 34.61 kg/m.  Generalized: Well developed, in no acute distress    Neurological examination  Mentation: Alert oriented to time, place, history taking. Follows all commands speech and language fluent Cranial nerve II-XII: Pupils were equal round reactive to light. Extraocular movements were full, visual field were full on confrontational test. Facial sensation and strength were normal.  Head turning and shoulder shrug  were normal and symmetric. Motor: The motor testing reveals 5 over 5 strength of all 4 extremities. Good symmetric motor tone is noted throughout.  Sensory: Sensory testing is intact to soft touch on all 4 extremities. No evidence of extinction is noted.  Gait and station: Gait is normal.   DIAGNOSTIC DATA (LABS, IMAGING, TESTING) - I reviewed patient records, labs, notes, testing and imaging myself where available.  Lab Results  Component Value Date   WBC 4.0 01/19/2022   HGB 15.0 01/19/2022   HCT 45.9 01/19/2022   MCV 88.4 01/19/2022   PLT 186 01/19/2022      Component Value Date/Time   NA 138 01/06/2022 0912   K 4.4 01/06/2022 0912   CL 101 01/06/2022 0912   CO2 23 01/06/2022 0912   GLUCOSE 78 01/06/2022 0912   GLUCOSE 97 10/20/2020 1030   BUN 17 01/06/2022 0912   CREATININE 0.81 01/06/2022 0912   CREATININE 1.13 01/05/2016 1403   CALCIUM 9.1 01/06/2022 0912   PROT 8.7 (H) 11/01/2018 1551   ALBUMIN 4.0 11/01/2018 1551   AST 54 (H) 11/01/2018 1551   ALT 31 11/01/2018 1551   ALKPHOS 74 11/01/2018 1551   BILITOT 1.5 (H) 11/01/2018 1551   GFRNONAA >60 10/20/2020 1030  GFRAA >60 12/14/2018 1102   Lab Results  Component Value Date   CHOL 187 09/22/2015   HDL 60 09/22/2015   LDLCALC 116 (H) 09/22/2015   TRIG 54 09/22/2015   CHOLHDL 3.1 09/22/2015   Lab Results  Component Value Date   HGBA1C 5.7 (H) 11/11/2015   No results found for: "VITAMINB12" No results found for: "TSH"  ASSESSMENT AND PLAN 58 y.o. year old male HST in May 2021 showed moderate OSA with an AHI of 26.6/h, his set up date  June 2021 on AutoPap  therapy  1.  OSA on CPAP  -He is doing great on CPAP.  He has superb compliance.  He will continue nightly use greater than 4 hours.  He will continue current settings, replace supplies as needed.  His CPAP machine should be 58 years old in June 2026.  We will follow-up in 1 year.  Margie Ege, AGNP-C, DNP 01/17/2024, 8:39 AM [facility] [address] [place], [place] [address] [phone]

## 2024-01-17 ENCOUNTER — Encounter: Payer: Self-pay | Admitting: Neurology

## 2024-01-17 ENCOUNTER — Ambulatory Visit (INDEPENDENT_AMBULATORY_CARE_PROVIDER_SITE_OTHER): Payer: Managed Care, Other (non HMO) | Admitting: Neurology

## 2024-01-17 VITALS — BP 129/75 | HR 68 | Ht 67.0 in | Wt 221.0 lb

## 2024-01-17 DIAGNOSIS — G4733 Obstructive sleep apnea (adult) (pediatric): Secondary | ICD-10-CM

## 2024-01-17 NOTE — Patient Instructions (Signed)
Great to see you today.  Continue superb CPAP usage.  Continue nightly usage for minimum of 4 hours.  Replace supplies as needed.  Follow-up in 1 year.  Thanks!!

## 2024-01-27 ENCOUNTER — Ambulatory Visit: Payer: Managed Care, Other (non HMO)

## 2024-02-01 ENCOUNTER — Ambulatory Visit: Payer: Managed Care, Other (non HMO) | Attending: Cardiology

## 2024-02-01 DIAGNOSIS — I4892 Unspecified atrial flutter: Secondary | ICD-10-CM | POA: Diagnosis not present

## 2024-02-01 DIAGNOSIS — Z7901 Long term (current) use of anticoagulants: Secondary | ICD-10-CM

## 2024-02-01 LAB — POCT INR: INR: 3.1 — AB (ref 2.0–3.0)

## 2024-02-01 NOTE — Patient Instructions (Signed)
 Description   Continue 1.5 tablets daily except 2 tablets on Tuesdays, Thursdays and Saturdays. Recheck INR in 4 weeks (normally 6 weeks). Coumadin Clinic [phone] or [phone]

## 2024-02-03 ENCOUNTER — Other Ambulatory Visit: Payer: Self-pay | Admitting: Cardiovascular Disease

## 2024-02-03 DIAGNOSIS — Z952 Presence of prosthetic heart valve: Secondary | ICD-10-CM

## 2024-02-29 ENCOUNTER — Ambulatory Visit: Attending: Internal Medicine | Admitting: *Deleted

## 2024-02-29 DIAGNOSIS — Z7901 Long term (current) use of anticoagulants: Secondary | ICD-10-CM | POA: Diagnosis not present

## 2024-02-29 DIAGNOSIS — I4892 Unspecified atrial flutter: Secondary | ICD-10-CM

## 2024-02-29 LAB — POCT INR: INR: 2.4 (ref 2.0–3.0)

## 2024-02-29 NOTE — Patient Instructions (Addendum)
 Description   Today take 2 tablets of warfarin then continue 1.5 tablets daily except 2 tablets on Tuesdays, Thursdays and Saturdays. Recheck INR in 5 weeks (normally 6 weeks). Coumadin Clinic [phone] or [phone]

## 2024-03-07 ENCOUNTER — Encounter: Payer: Self-pay | Admitting: Podiatry

## 2024-03-07 ENCOUNTER — Ambulatory Visit (INDEPENDENT_AMBULATORY_CARE_PROVIDER_SITE_OTHER): Payer: Managed Care, Other (non HMO) | Admitting: Podiatry

## 2024-03-07 VITALS — Ht 67.0 in | Wt 221.0 lb

## 2024-03-07 DIAGNOSIS — D6859 Other primary thrombophilia: Secondary | ICD-10-CM | POA: Diagnosis not present

## 2024-03-07 DIAGNOSIS — M79675 Pain in left toe(s): Secondary | ICD-10-CM | POA: Diagnosis not present

## 2024-03-07 DIAGNOSIS — L84 Corns and callosities: Secondary | ICD-10-CM

## 2024-03-07 DIAGNOSIS — B351 Tinea unguium: Secondary | ICD-10-CM | POA: Diagnosis not present

## 2024-03-07 DIAGNOSIS — M79674 Pain in right toe(s): Secondary | ICD-10-CM | POA: Diagnosis not present

## 2024-03-11 NOTE — Progress Notes (Signed)
  Subjective:  Patient ID: Oscar Brown, male    DOB: 1966-05-12,  MRN: [id number]  Oscar Brown presents to clinic today for at risk foot care with h/o clotting disorder and callus(es) of both feet, porokeratotic lesion(s) left foot, and painful mycotic nails. Painful toenails interfere with ambulation. Aggravating factors include wearing enclosed shoe gear. Pain is relieved with periodic professional debridement. Painful callus(es) and porokeratotic lesion(s) are aggravated when weightbearing with and without shoegear. Pain is relieved with periodic professional debridement.  Chief Complaint  Patient presents with   Nail Problem    Pt is here for [facility] PCP is Dr Oscar Brown and LOV was a few months ago.   New problem(s): None.   PCP is Oscar Brown., MD.  Allergies  Allergen Reactions   Bee Venom Other (See Comments)   Iohexol Hives, Itching and Other (See Comments)    Patient needs 13 hour prep per Dr. Reva Brown, after cath he broke out in hives and had itching     Review of Systems: Negative except as noted in the HPI.  Objective: No changes noted in today's physical examination. There were no vitals filed for this visit. Oscar Brown is a pleasant 58 y.o. male in NAD. AAO x 3.  Vascular Examination: Capillary refill time <3 seconds b/l LE. Palpable pedal pulses b/l LE. Digital hair present b/l. No pedal edema b/l. Skin temperature gradient WNL b/l. Patient wearing compression hose on today's visit. Evidence of chronic venous insufficiency left lower extremity..  Dermatological Examination: Pedal skin with normal turgor, texture and tone b/l. No open wounds. No interdigital macerations b/l.   Hyperkeratotic lesion(s) L hallux, submet head 1 right foot, submet head 5 right foot, and sub 5th met base left foot.  No erythema, no edema, no drainage, no fluctuance.  Toenails 1-5 b/l elongated, discolored, dystrophic, thickened, crumbly with subungual debris and tenderness to  dorsal palpation.  Neurological Examination: Protective sensation intact with 10 gram monofilament b/l LE. Vibratory sensation intact b/l LE.   Musculoskeletal Examination: Muscle strength 5/5 to all lower extremity muscle groups bilaterally. Plantarflexed metatarsal(s) 1st metatarsal head right foot and 5th metatarsal head right foot. Pes planus deformity noted bilateral LE.  Assessment/Plan: 1. Pain due to onychomycosis of toenails of both feet   2. Callus   3. Hypercoagulable state (HCC)   -Consent given for treatment as described below: -Examined patient. -Patient to continue soft, supportive shoe gear daily. -Mycotic toenails 1-5 bilaterally were debrided in length and girth with sterile nail nippers and dremel without incident. -Callus(es) left great toe, submet head 1 right foot, submet head 5 right foot, and sub 5th met base left foot pared utilizing sterile scalpel blade without complication or incident. Total number debrided =4. -Patient/POA to call should there be question/concern in the interim.   Return in about 3 months (around 06/06/2024).  Oscar Brown, DPM      [place] LOCATION: [address], [place] [address]                   Office [phone]   [facility] LOCATION: [address] [place], [place] [address] Office [phone]

## 2024-04-04 ENCOUNTER — Ambulatory Visit: Attending: Internal Medicine | Admitting: *Deleted

## 2024-04-04 DIAGNOSIS — Z7901 Long term (current) use of anticoagulants: Secondary | ICD-10-CM

## 2024-04-04 DIAGNOSIS — I4892 Unspecified atrial flutter: Secondary | ICD-10-CM | POA: Diagnosis not present

## 2024-04-04 LAB — POCT INR: INR: 1.5 — AB (ref 2.0–3.0)

## 2024-04-04 NOTE — Patient Instructions (Signed)
 Description   Today take 2 tablets of warfarin and tomorrow take 2.5 tablets of warfarin then continue 1.5 tablets daily except 2 tablets on Tuesdays, Thursdays and Saturdays. Recheck INR in 1 week (normally 6 weeks). Coumadin  Clinic [phone] or [phone]

## 2024-04-12 ENCOUNTER — Ambulatory Visit: Attending: Internal Medicine | Admitting: *Deleted

## 2024-04-12 DIAGNOSIS — Z7901 Long term (current) use of anticoagulants: Secondary | ICD-10-CM

## 2024-04-12 DIAGNOSIS — Z5181 Encounter for therapeutic drug level monitoring: Secondary | ICD-10-CM | POA: Diagnosis not present

## 2024-04-12 LAB — POCT INR: POC INR: 1.9

## 2024-04-12 NOTE — Patient Instructions (Signed)
 Description   Take 2.5 tablets of warfarin today and then START taking warfarin 2 tablets daily except for 1.5 tablets on Mondays. Recheck INR in 1 week (normally 6 weeks). Coumadin  Clinic [phone] or [phone]

## 2024-04-13 ENCOUNTER — Other Ambulatory Visit (HOSPITAL_COMMUNITY): Payer: Self-pay | Admitting: Physician Assistant

## 2024-04-13 DIAGNOSIS — I484 Atypical atrial flutter: Secondary | ICD-10-CM

## 2024-04-19 ENCOUNTER — Encounter

## 2024-04-19 ENCOUNTER — Ambulatory Visit: Attending: Internal Medicine | Admitting: *Deleted

## 2024-04-19 ENCOUNTER — Other Ambulatory Visit: Payer: Self-pay | Admitting: Cardiovascular Disease

## 2024-04-19 DIAGNOSIS — Z7901 Long term (current) use of anticoagulants: Secondary | ICD-10-CM

## 2024-04-19 DIAGNOSIS — Z952 Presence of prosthetic heart valve: Secondary | ICD-10-CM

## 2024-04-19 DIAGNOSIS — Z5181 Encounter for therapeutic drug level monitoring: Secondary | ICD-10-CM

## 2024-04-19 LAB — POCT INR: POC INR: 2

## 2024-04-19 NOTE — Patient Instructions (Signed)
 Description   Take 2.5 tablets of warfarin today  Then START taking warfarin 2 tablets daily except for 2.5 tablets on Mondays and Fridays. Recheck INR in 1 week. Coumadin  Clinic [phone] or [phone]

## 2024-04-19 NOTE — Telephone Encounter (Signed)
 Prescription refill request received for warfarin Lov: 12/31/2022, taylor Next INR check: 6/6 Warfarin tablet strength: 5mg 

## 2024-05-04 ENCOUNTER — Ambulatory Visit: Attending: Internal Medicine

## 2024-05-04 DIAGNOSIS — Z7901 Long term (current) use of anticoagulants: Secondary | ICD-10-CM

## 2024-05-04 LAB — POCT INR: INR: 5.1 — AB (ref 2.0–3.0)

## 2024-05-04 NOTE — Patient Instructions (Signed)
 Hold today then continue taking warfarin 2 tablets daily except for 2.5 tablets on Mondays and Fridays. Recheck INR in 2 weeks. Coumadin  Clinic [phone] or [phone]; Greens 2 per week.

## 2024-05-18 ENCOUNTER — Ambulatory Visit: Attending: Internal Medicine

## 2024-05-18 DIAGNOSIS — Z7901 Long term (current) use of anticoagulants: Secondary | ICD-10-CM | POA: Diagnosis not present

## 2024-05-18 LAB — POCT INR: INR: 4.4 — AB (ref 2.0–3.0)

## 2024-05-18 NOTE — Patient Instructions (Signed)
 Hold today then Decrease to 2 tablets daily except for 2.5 tablets every Monday.  Recheck INR in 3 weeks. Coumadin  Clinic [phone] or [phone]; Greens 2 per week.

## 2024-05-18 NOTE — Progress Notes (Signed)
Please see anticoagulation encounter.

## 2024-06-08 ENCOUNTER — Ambulatory Visit: Attending: Internal Medicine

## 2024-06-08 DIAGNOSIS — Z7901 Long term (current) use of anticoagulants: Secondary | ICD-10-CM | POA: Diagnosis not present

## 2024-06-08 DIAGNOSIS — I4892 Unspecified atrial flutter: Secondary | ICD-10-CM

## 2024-06-08 LAB — POCT INR: INR: 4.4 — AB (ref 2.0–3.0)

## 2024-06-08 NOTE — Progress Notes (Signed)
Please see anticoagulation encounter.

## 2024-06-08 NOTE — Patient Instructions (Signed)
 Description   Hold today's dosage of Warfarin, then start taking 2 tablets daily.  Recheck INR in 3 weeks. Coumadin  Clinic [phone] or [phone]; Greens 2 per week.

## 2024-06-19 ENCOUNTER — Ambulatory Visit (INDEPENDENT_AMBULATORY_CARE_PROVIDER_SITE_OTHER): Admitting: Podiatry

## 2024-06-19 ENCOUNTER — Encounter: Payer: Self-pay | Admitting: Podiatry

## 2024-06-19 DIAGNOSIS — M79674 Pain in right toe(s): Secondary | ICD-10-CM | POA: Diagnosis not present

## 2024-06-19 DIAGNOSIS — B351 Tinea unguium: Secondary | ICD-10-CM | POA: Diagnosis not present

## 2024-06-19 DIAGNOSIS — L84 Corns and callosities: Secondary | ICD-10-CM

## 2024-06-19 DIAGNOSIS — D6859 Other primary thrombophilia: Secondary | ICD-10-CM

## 2024-06-19 DIAGNOSIS — M79675 Pain in left toe(s): Secondary | ICD-10-CM | POA: Diagnosis not present

## 2024-06-24 NOTE — Progress Notes (Signed)
  Subjective:  Patient ID: Oscar Brown, male    DOB: Jun 30, 1966,  MRN: [id number]  58 y.o. male presents to clinic with  at risk foot care with h/o clotting disorder and callus(es) b/l feet and painful mycotic toenails that are difficult to trim. Painful toenails interfere with ambulation. Aggravating factors include wearing enclosed shoe gear. Pain is relieved with periodic professional debridement. Painful calluses are aggravated when weightbearing with and without shoegear. Pain is relieved with periodic professional debridement.    New problem(s): None   PCP is Loreli Elsie JONETTA Mickey., MD.  Allergies  Allergen Reactions   Bee Venom Other (See Comments)   Iohexol  Hives, Itching and Other (See Comments)    Patient needs 13 hour prep per Dr. Adele, after cath he broke out in hives and had itching     Review of Systems: Negative except as noted in the HPI.   Objective:  Oscar Brown is a pleasant 58 y.o. male in NAD. AAO x 3.  Vascular Examination: Vascular status intact b/l with palpable pedal pulses. Pedal hair sparse. CFT immediate b/l. No edema. No pain with calf compression b/l. Skin temperature gradient WNL b/l. Patient wearing compression hose on today's visit. Evidence of skin changes consistent with long term venous stasis BLE.  Neurological Examination: Sensation grossly intact b/l with 10 gram monofilament. Vibratory sensation intact b/l.   Dermatological Examination: Pedal skin with normal turgor, texture and tone b/l. Toenails 1-5 b/l thick, discolored, elongated with subungual debris and pain on dorsal palpation.Hyperkeratotic lesion(s) medial IPJ of left great toe, submet head 1 right foot, submet head 5 right foot, and sub 5th met base left foot.  No erythema, no edema, no drainage, no fluctuance.  Musculoskeletal Examination Muscle strength 5/5 to all lower extremity muscle groups bilaterally. Plantarflexed metatarsal(s) 1st metatarsal head right foot and 5th  metatarsal head right foot. Pes planus deformity noted bilateral LE.SABRA No pain, crepitus or joint limitation noted with ROM b/l LE.  Patient ambulates independently without assistive aids.  Radiographs: None Assessment:   1. Pain due to onychomycosis of toenails of both feet   2. Callus   3. Hypercoagulable state (HCC)    Plan:  Patient was evaluated and treated. All patient's and/or POA's questions/concerns addressed on today's visit. Toenails 1-5 debrided in length and girth without incident. Callus(es) medial IPJ of left great toe, submet head 1 right foot, submet head 5 right foot, and sub 5th met base left foot pared with sharp debridement without incident. Continue soft, supportive shoe gear daily. Report any pedal injuries to medical professional. Call office if there are any questions/concerns.  Return in about 3 months (around 09/19/2024).  Delon LITTIE Merlin, DPM      [place] LOCATION: [address] [place] [address]                   Office [phone]   [facility] LOCATION: [address] [place], [place] [address] Office [phone]

## 2024-06-29 ENCOUNTER — Ambulatory Visit

## 2024-07-03 ENCOUNTER — Ambulatory Visit (INDEPENDENT_AMBULATORY_CARE_PROVIDER_SITE_OTHER)

## 2024-07-03 ENCOUNTER — Ambulatory Visit
Admission: RE | Admit: 2024-07-03 | Discharge: 2024-07-03 | Disposition: A | Discharge status: Home / Self Care | Source: Ambulatory Visit | Attending: Internal Medicine | Admitting: Internal Medicine

## 2024-07-03 ENCOUNTER — Encounter (HOSPITAL_COMMUNITY): Payer: Self-pay | Admitting: Internal Medicine

## 2024-07-03 VITALS — BP 126/76 | HR 68 | Ht 67.0 in | Wt 213.2 lb

## 2024-07-03 DIAGNOSIS — Z7901 Long term (current) use of anticoagulants: Secondary | ICD-10-CM

## 2024-07-03 DIAGNOSIS — I484 Atypical atrial flutter: Secondary | ICD-10-CM

## 2024-07-03 DIAGNOSIS — I5022 Chronic systolic (congestive) heart failure: Secondary | ICD-10-CM | POA: Diagnosis present

## 2024-07-03 DIAGNOSIS — R001 Bradycardia, unspecified: Secondary | ICD-10-CM | POA: Insufficient documentation

## 2024-07-03 LAB — POCT INR: INR: 3.9 — AB (ref 2.0–3.0)

## 2024-07-03 MED ORDER — FUROSEMIDE 40 MG PO TABS
ORAL_TABLET | ORAL | 3 refills | Status: DC
Start: 2024-07-03 — End: 2024-07-19

## 2024-07-03 NOTE — Patient Instructions (Signed)
 Take only 1 tablet today then continue taking 2 tablets daily.  Recheck INR in 4 weeks. Coumadin  Clinic [phone]. Greens 2 per week.

## 2024-07-03 NOTE — Progress Notes (Signed)
 INR 3.9. Please see anticoagulation encounter

## 2024-07-03 NOTE — Progress Notes (Incomplete)
 Patient ID: Oscar Brown, male   DOB: 02/15/1966, 58 y.o.   MRN: [id number]     Advanced Heart Failure Clinic Note     Cardiologist: Dr Cherrie  PCP: Georgette Gentry  HPI: Oscar Brown is a 58 y.o. male  with OSA and mitral regurgitation. 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 On POD #3  he had a sudden VF arrest with nearly an hour of resuscitation time. Follow-up transthoracic and transesophageal echocardiograms performed after the arrest revealed normal function of the mechanical prosthetic valve with severe dysfunction involving the inferior wall of the left ventricle EF35-40%.  Cardiac enzymes went quite high and he had some bradycardia and AV block, all suggestive of possible inferior wall myocardial infarction. However, left ventricular function completely normalized prior to hospital discharge. Plans for diagnostic cardiac catheterization were postponed because the patient developed acute non-oliguric renal failure with creatinine up to 7. Because the patient's rhythm and left ventricular systolic function recovered completely, it was felt that the patient did not need defibrillator implantation.  The patient was ultimately discharged from the hospital on the 18th postoperative day.    Underwent successful RFA of AFL 03/20/18 with Dr. Waddell   Admitted 12/19 with LLE cellulitis. Started on empiric ABX. BCx positive for Streptococcus agalactiae. With mechanical MV, taken for TEE which showed highly mobile echo-density attached to the anterior portion of the sewing ring of the mechanical mitral valve measuring 12x5 mm consistent with prosthesis vegetation. Pt thought not to need surgical intervention. PICC was placed to continue 6 week course of IV ABX per ID. Also noted to have chronic DVT. Coumadin  continued, no other change.   Had  TEE 12/13/2018 vegetation resolved. Valve working well.   Today he returns for HF follow up. 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 Denies CP or SOB. INR well controlled. Mild chronic edema in LLE. No orthopnea or PND. No palpitations.   Echo 12/24 EF 55-65% MVR stable   Echo 02/23  EF 60-65%, normal functioning mitral valve prosthesis, RV okay  TEE 12/13/2018 LEFT VENTRICLE: EF = 55-60%. No regional wall motion abnormalities. LEFT ATRIUM: Dilated LEFT ATRIAL APPENDAGE: No thrombus.  AORTIC VALVE:  Trileaflet. No AI/AS. No vegetation. MITRAL VALVE:    Mechanical MV. Functioning well. No MR. Mild MS mean gradient . Previously seen vegetation no longer present  TRICUSPID VALVE: Normal. Mild TR. No vegetation. PULMONIC VALVE: Grossly normal. No vegetation. INTERATRIAL SEPTUM: No PFO or ASD.  DESCENDING AORTA: Mild plaque  TEE 11/03/18 There is a highly mobile echodensity attached to the anterior portion of the sewing ring of the mechanical mitral valve measuring 12 x 5 mm consistent with prosthesis vegetation.  Echo 02/20/18 EF ~50% inferior HK RV mild to moderately HK. Septal flattening.  Echo 2/17 EF 45-50% inferior HK. MV ok   Review of systems complete and found to be negative unless listed in HPI.   SH:  Social History   Socioeconomic History   Marital status: Divorced    Spouse name: Not on file   Number of children: 3   Years of education: Not on file   Highest education level: Not on file  Occupational History   Not on file  Tobacco Use   Smoking status: Former    Current packs/day: 0.00    Average packs/day: 0.1 packs/day for 9.0 years (  0.9 ttl pk-yrs)    Types: Cigarettes    Start date: 09/06/2003    Quit date: 09/05/2012    Years since quitting: 11.8   Smokeless tobacco: Never  Vaping Use   Vaping status: Never Used  Substance and Sexual Activity   Alcohol use: Yes    Alcohol/week: 0.0 standard drinks of alcohol     Comment: 1 beer once a week or special occasions   Drug use: No   Sexual activity: Yes  Other Topics Concern   Not on file  Social History Narrative   Not on file   Social Drivers of Health   Financial Resource Strain: Not on file  Food Insecurity: Not on file  Transportation Needs: Not on file  Physical Activity: Not on file  Stress: Not on file  Social Connections: Not on file  Intimate Partner Violence: Not on file   FH:  - Denies history of sudden cardiac death.  Past Medical History:  Diagnosis Date   Acute diastolic congestive heart failure (HCC) 09/21/2015   takes Lasix  daily   Acute renal failure (HCC)    ATN s/p cardiac arrest   Atrial fibrillation (HCC)    Bee sting allergy    CAD (coronary artery disease)    cath 10-2015   Chronic venous hypertension due to deep vein thrombosis 12/14/2006   1993.  Complicated by chronic left lower extremity edema and occasional recurrent left lower extremity cellulitis    COPD (chronic obstructive pulmonary disease) (HCC)    DVT (deep venous thrombosis) (HCC) early 80's   left leg   HLD (hyperlipidemia)    Incidental lung nodule, less than or equal to 3mm 11/10/2015   3 mm nodule right lung   Lymphedema early 80's   Mitral regurgitation 10/06/2012   Obstructive sleep apnea 12/14/2006   AHI 19.2/hr, desaturation to 75% on Polysomnography 02/26/2005.  Uses 11 cm H2O nocturnal nasal CPAP with good symptom control.    Old myocardial infarction    OSA on CPAP    S/P minimally invasive mitral valve replacement with metallic valve 11/13/2015   33 mm Carbomedics Optiform bileaflet mechanical prosthesis placed via right mini-thoracotomy approach    Current Outpatient Medications  Medication Sig Dispense Refill   aspirin  (ASPIR-81) 81 MG EC tablet Take 1 tablet (81 mg total) by mouth daily. Swallow whole. 30 tablet 12   Elastic Bandages & Supports (MEDICAL COMPRESSION STOCKINGS) MISC by Does not apply route.     EPINEPHrine  0.3  mg/0.3 mL IJ SOAJ injection as needed.     furosemide  (LASIX ) 40 MG tablet TAKE 1 TABLET BY MOUTH TWICE A WEEK ON MONDAYS AND FRIDAYS 26 tablet 0   rosuvastatin  (CRESTOR ) 20 MG tablet Take 20 mg by mouth daily.     warfarin (COUMADIN ) 5 MG tablet TAKE  2 TABLETS TO 2 &1/2  TABLETS  BY MOUTH DAILY  OR AS DIRECTED BY COUMADIN  CLINIC 200 tablet 0   No current facility-administered medications for this encounter.   Vitals:   07/03/24 0904  BP: 126/76  Pulse: 68  SpO2: 99%  Weight: 96.7 kg (213 lb 3.2 oz)  Height: 5' 7 (1.702 m)      Wt Readings from Last 3 Encounters:  07/03/24 96.7 kg (213 lb 3.2 oz)  03/07/24 100.2 kg (221 lb)  01/17/24 100.2 kg (221 lb)    PHYSICAL EXAM: General:  Well appearing. No resp difficulty HEENT: normal Neck: supple. no JVD. Carotids 2+ bilat; no bruits. No lymphadenopathy or thryomegaly  appreciated. Cor: PMI nondisplaced. Regular rate & rhythm. Mechanical s1 Lungs: clear Abdomen: soft, nontender, nondistended. No hepatosplenomegaly. No bruits or masses. Good bowel sounds. Extremities: no cyanosis, clubbing, rash, 2+ lymphedema in LLE Neuro: alert & orientedx3, cranial nerves grossly intact. moves all 4 extremities w/o difficulty. Affect pleasant   ECG: Sinus bradycardia 58 1AVB    ASSESSMENT & PLAN:  1. Recurrent A flutter, atypical - s/p DCCV 12/23/17. - s/p Aflutter ablation 03/20/18  - On coumadin  for MVR. - in NSR today. No evidence recurrence - No bleeding issues.  Denies bleeding.  2. Mitral regurgitation s/p mechanical MVR in 12/16 - Normal EF and stable without vegetation on TEE 12/13/2018. - Echo 2/23: EF 60-65%, normal functioning MV prosthesis, RV okay - Continue coumadin  per coumadin  clinic. INR goal 2.5-3.5. - Has resultant RV strain due longstanding MV disease - Reinforced need for SBE prophylaxis - Due for repeat echo  3. 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 - Completed  gentamicin  therapy 11/17/18 and IV PCN 12/17/2018 - Saw ID 01/04/19 - F/u TEE 1/20 no vegetation. Valve functioning well.  4. H/o VT/VF Arrest - No change.  5. Chronic DVT - Continue coumadin  as above. No change.  6. Lymphedema in left leg - Continue lasix  40 mg twice weekly (refilled). Stable.  - Continue compression stockings.   7. HTN - Blood pressure well controlled. Continue current regimen. 8. CAD - Cath 12/16 (pre-MVR) distal LCx 45% - No s/s angina - Continue ASA/statin  Follow-up: 1 year  Toribio Fuel, MD  9:41 AM

## 2024-07-03 NOTE — Patient Instructions (Addendum)
 Good to see you today!   Your physician recommends that you schedule a follow-up appointment 1 year ( August 2026)Call office in June 2026 to schedule an appointment  If you have any questions or concerns before your next appointment please send us  a message through [facility] or call our office at [phone].    TO LEAVE A MESSAGE FOR THE NURSE SELECT OPTION 2, PLEASE LEAVE A MESSAGE INCLUDING: YOUR NAME DATE OF BIRTH CALL BACK NUMBER REASON FOR CALL**this is important as we prioritize the call backs  YOU WILL RECEIVE A CALL BACK THE SAME DAY AS LONG AS YOU CALL BEFORE 4:00 PM At the Advanced [facility] Failure Clinic, you and your health needs are our priority. As part of our continuing mission to provide you with exceptional heart care, we have created designated Provider Care Teams. These Care Teams include your primary Cardiologist (physician) and Advanced Practice Providers (APPs- Physician Assistants and Nurse Practitioners) who all work together to provide you with the care you need, when you need it.   You may see any of the following providers on your designated Care Team at your next follow up: Dr Toribio Fuel Dr Ezra Shuck Dr. Ria Commander Dr. Morene Brownie Amy Lenetta, NP Caffie Shed, [place] [facility] [place], [place] Beckey Coe, NP [place] Lee, NP Ellouise Class, NP Tinnie Redman, PharmD Jaun Bash, PharmD   Please be sure to bring in all your medications bottles to every appointment.    Thank you for choosing [facility]

## 2024-07-04 ENCOUNTER — Other Ambulatory Visit: Payer: Self-pay | Admitting: Cardiovascular Disease

## 2024-07-04 DIAGNOSIS — Z952 Presence of prosthetic heart valve: Secondary | ICD-10-CM

## 2024-07-04 NOTE — Telephone Encounter (Signed)
 Refill request for warfarin:  Last INR was 3.9 on 07/03/24 Next INR due 07/31/24 LOV was 07/03/24  Refill approved.

## 2024-07-05 ENCOUNTER — Telehealth: Payer: Self-pay

## 2024-07-05 NOTE — Telephone Encounter (Signed)
 Faxed cpap orders to [facility]

## 2024-07-19 ENCOUNTER — Other Ambulatory Visit (HOSPITAL_COMMUNITY): Payer: Self-pay

## 2024-07-19 DIAGNOSIS — I484 Atypical atrial flutter: Secondary | ICD-10-CM

## 2024-07-19 MED ORDER — FUROSEMIDE 40 MG PO TABS: ORAL_TABLET | ORAL | 3 refills | Status: AC

## 2024-07-31 ENCOUNTER — Ambulatory Visit

## 2024-08-08 ENCOUNTER — Ambulatory Visit: Attending: Internal Medicine | Admitting: *Deleted

## 2024-08-08 DIAGNOSIS — Z7901 Long term (current) use of anticoagulants: Secondary | ICD-10-CM

## 2024-08-08 DIAGNOSIS — I4892 Unspecified atrial flutter: Secondary | ICD-10-CM

## 2024-08-08 LAB — POCT INR: INR: 3.3 — AB (ref 2.0–3.0)

## 2024-08-08 NOTE — Progress Notes (Signed)
 Description   INR-3.3; Continue taking 2 tablets daily.  Recheck INR in 4 weeks. Coumadin  Clinic [phone]. Continue eating greens 2 per week.

## 2024-08-08 NOTE — Patient Instructions (Signed)
 Description   INR-3.3; Continue taking 2 tablets daily.  Recheck INR in 4 weeks. Coumadin  Clinic [phone]. Continue eating greens 2 per week.

## 2024-09-05 ENCOUNTER — Ambulatory Visit: Attending: Internal Medicine | Admitting: *Deleted

## 2024-09-05 DIAGNOSIS — I4892 Unspecified atrial flutter: Secondary | ICD-10-CM

## 2024-09-05 DIAGNOSIS — Z7901 Long term (current) use of anticoagulants: Secondary | ICD-10-CM | POA: Diagnosis not present

## 2024-09-05 LAB — POCT INR: INR: 3.2 — AB (ref 2.0–3.0)

## 2024-09-05 NOTE — Patient Instructions (Signed)
 Description   INR-3.2; Continue taking 2 tablets daily.  Recheck INR in 5 weeks. Coumadin  Clinic [phone]. Continue eating greens 2 per week.

## 2024-09-05 NOTE — Progress Notes (Signed)
 Description   INR-3.2; Continue taking 2 tablets daily.  Recheck INR in 5 weeks. Coumadin  Clinic [phone]. Continue eating greens 2 per week.

## 2024-09-06 ENCOUNTER — Telehealth (HOSPITAL_COMMUNITY): Payer: Self-pay | Admitting: Cardiology

## 2024-09-06 MED ORDER — AMOXICILLIN 500 MG PO TABS
2000.0000 mg | ORAL_TABLET | Freq: Once | ORAL | 0 refills | Status: AC
Start: 2024-09-06 — End: 2024-09-06

## 2024-09-06 NOTE — Telephone Encounter (Signed)
 Pt aware.

## 2024-09-06 NOTE — Telephone Encounter (Signed)
 Can use amoxicillin  2g 30-60 minutes prior to dental work

## 2024-09-06 NOTE — Telephone Encounter (Signed)
 Patient called to request refill on prophylactic abx for upcoming dental appt

## 2024-10-03 ENCOUNTER — Ambulatory Visit: Admitting: Podiatry

## 2024-10-03 ENCOUNTER — Encounter: Payer: Self-pay | Admitting: Podiatry

## 2024-10-03 DIAGNOSIS — B351 Tinea unguium: Secondary | ICD-10-CM

## 2024-10-03 DIAGNOSIS — L84 Corns and callosities: Secondary | ICD-10-CM | POA: Diagnosis not present

## 2024-10-03 DIAGNOSIS — M79674 Pain in right toe(s): Secondary | ICD-10-CM | POA: Diagnosis not present

## 2024-10-03 DIAGNOSIS — M79675 Pain in left toe(s): Secondary | ICD-10-CM

## 2024-10-03 DIAGNOSIS — D6859 Other primary thrombophilia: Secondary | ICD-10-CM

## 2024-10-08 NOTE — Progress Notes (Signed)
  Subjective:  Patient ID: Oscar Brown, male    DOB: 03-07-1966,  MRN: [id number]  [place] JINNY Pepper presents to clinic today for at risk foot care with h/o clotting disorder and callus(es) of both feet and painful mycotic toenails that are difficult to trim. Painful toenails interfere with ambulation. Aggravating factors include wearing enclosed shoe gear. Pain is relieved with periodic professional debridement. Painful calluses are aggravated when weightbearing with and without shoegear. Pain is relieved with periodic professional debridement.  Chief Complaint  Patient presents with   RFC     RFC Non diabetic toenail and callus care. LOV with PCP 11/24/23.   New problem(s): None.   PCP is Loreli Elsie JONETTA Mickey., MD.  Allergies  Allergen Reactions   Bee Venom Other (See Comments)   Iohexol  Hives, Itching and Other (See Comments)    Patient needs 13 hour prep per Dr. Adele, after cath he broke out in hives and had itching    Review of Systems: Negative except as noted in the HPI.  Objective: No changes noted in today's physical examination. There were no vitals filed for this visit. JOFFREY KERCE is a pleasant 58 y.o. male in NAD. AAO x 3.  Vascular Examination: Vascular status intact b/l with palpable pedal pulses. Pedal hair sparse. CFT immediate b/l. No edema. No pain with calf compression b/l. Skin temperature gradient WNL b/l. Patient wearing compression hose on today's visit. Evidence of skin changes consistent with long term venous stasis BLE.  Neurological Examination: Sensation grossly intact b/l with 10 gram monofilament. Vibratory sensation intact b/l.   Dermatological Examination: Pedal skin with normal turgor, texture and tone b/l. Toenails 1-5 b/l thick, discolored, elongated with subungual debris and pain on dorsal palpation. Hyperkeratotic lesion(s) medial IPJ of left great toe, submet head 1 right foot, submet head 5 right foot, and sub 5th met base left foot.  No  erythema, no edema, no drainage, no fluctuance.  Musculoskeletal Examination Muscle strength 5/5 to all lower extremity muscle groups bilaterally. Plantarflexed metatarsal(s) 1st metatarsal head right foot and 5th metatarsal head right foot. Pes planus deformity noted bilateral LE.SABRA No pain, crepitus or joint limitation noted with ROM b/l LE.  Patient ambulates independently without assistive aids.  Radiographs: None  Assessment/Plan: 1. Pain due to onychomycosis of toenails of both feet   2. Callus   3. Hypercoagulable state   Consent given for treatment. Patient examined. All patient's and/or POA's questions/concerns addressed on today's visit. Mycotic toenails 1-5 b/l debrided in length and girth without incident. Callus(es) plantar IPJ of left great toe, submet head 1 right foot, submet head 5 right foot, and sub 5th met base left foot pared with sharp debridement without incident.Continue soft, supportive shoe gear daily. Report any pedal injuries to medical professional. Call office if there are any quesitons/concerns.  Return in about 3 months (around 01/03/2025).  Delon LITTIE Merlin, DPM      [place] LOCATION: [address] [place] [address]                   Office [phone]   [facility] LOCATION: [address] [place], [place] [address] Office [phone]

## 2024-10-10 ENCOUNTER — Ambulatory Visit: Attending: Internal Medicine | Admitting: Pharmacist

## 2024-10-10 DIAGNOSIS — I4892 Unspecified atrial flutter: Secondary | ICD-10-CM | POA: Diagnosis not present

## 2024-10-10 DIAGNOSIS — Z952 Presence of prosthetic heart valve: Secondary | ICD-10-CM | POA: Diagnosis not present

## 2024-10-10 DIAGNOSIS — Z7901 Long term (current) use of anticoagulants: Secondary | ICD-10-CM

## 2024-10-10 LAB — POCT INR: INR: 4.1 — AB (ref 2.0–3.0)

## 2024-10-10 NOTE — Progress Notes (Signed)
 Description   INR-4.1; Hold dose today and the continue taking 2 tablets daily.  Recheck INR in 2 weeks. Coumadin  Clinic [phone]. Continue eating greens 2 per week.

## 2024-10-10 NOTE — Patient Instructions (Signed)
 Description   INR-4.1; Hold dose today and the continue taking 2 tablets daily.  Recheck INR in 2 weeks. Coumadin  Clinic [phone]. Continue eating greens 2 per week.

## 2024-10-24 ENCOUNTER — Ambulatory Visit: Attending: Internal Medicine | Admitting: *Deleted

## 2024-10-24 DIAGNOSIS — Z952 Presence of prosthetic heart valve: Secondary | ICD-10-CM

## 2024-10-24 DIAGNOSIS — Z7901 Long term (current) use of anticoagulants: Secondary | ICD-10-CM | POA: Diagnosis not present

## 2024-10-24 DIAGNOSIS — I4892 Unspecified atrial flutter: Secondary | ICD-10-CM | POA: Diagnosis not present

## 2024-10-24 LAB — POCT INR: INR: 5.8 — AB (ref 2.0–3.0)

## 2024-10-24 NOTE — Patient Instructions (Signed)
 Description   INR-5.8; Do not take any warfarin today and no warfarin tomorrow then START taking 2 tablets daily except 1 tablet on Sundays. Recheck INR in 1 week.  Coumadin  Clinic [phone]. Continue eating greens 2 per week.

## 2024-10-24 NOTE — Progress Notes (Signed)
 Description   INR-5.8; Do not take any warfarin today and no warfarin tomorrow then START taking 2 tablets daily except 1 tablet on Sundays. Recheck INR in 1 week.  Coumadin  Clinic [phone]. Continue eating greens 2 per week.

## 2024-10-31 ENCOUNTER — Ambulatory Visit: Attending: Internal Medicine | Admitting: Pharmacist

## 2024-10-31 DIAGNOSIS — Z7901 Long term (current) use of anticoagulants: Secondary | ICD-10-CM | POA: Diagnosis not present

## 2024-10-31 DIAGNOSIS — Z952 Presence of prosthetic heart valve: Secondary | ICD-10-CM | POA: Diagnosis not present

## 2024-10-31 DIAGNOSIS — I4892 Unspecified atrial flutter: Secondary | ICD-10-CM

## 2024-10-31 LAB — POCT INR: INR: 1.9 — AB (ref 2.0–3.0)

## 2024-10-31 NOTE — Progress Notes (Signed)
 Description   INR-; 1.9 Take 3 tablets today and then resume taking 2 tablets daily except 1 tablet on Sundays. Recheck INR in 3 week.  Coumadin  Clinic [phone]. Continue eating greens 2 per week.

## 2024-10-31 NOTE — Patient Instructions (Signed)
 Description   INR-; 1.9 Take 3 tablets today and then resume taking 2 tablets daily except 1 tablet on Sundays. Recheck INR in 3 week.  Coumadin  Clinic [phone]. Continue eating greens 2 per week.

## 2024-11-28 ENCOUNTER — Ambulatory Visit: Attending: Internal Medicine | Admitting: *Deleted

## 2024-11-28 DIAGNOSIS — Z7901 Long term (current) use of anticoagulants: Secondary | ICD-10-CM | POA: Diagnosis not present

## 2024-11-28 DIAGNOSIS — Z5181 Encounter for therapeutic drug level monitoring: Secondary | ICD-10-CM

## 2024-11-28 LAB — POCT INR: POC INR: 2.8

## 2024-11-28 NOTE — Patient Instructions (Signed)
 Description   INR- 2.8; Continue taking 2 tablets daily except 1 tablet on Sundays. Recheck INR in 4 week.  Coumadin  Clinic [phone]. Continue eating greens 2 per week.

## 2024-11-28 NOTE — Progress Notes (Signed)
 Lab Results  Component Value Date   INR 2.8 11/28/2024   INR 1.9 (A) 10/31/2024   INR 5.8 (A) 10/24/2024    Description   INR- 2.8; Continue taking 2 tablets daily except 1 tablet on Sundays. Recheck INR in 4 week.  Coumadin  Clinic [phone]. Continue eating greens 2 per week.

## 2024-12-26 ENCOUNTER — Ambulatory Visit: Payer: Self-pay

## 2024-12-31 ENCOUNTER — Other Ambulatory Visit (HOSPITAL_COMMUNITY): Payer: Self-pay

## 2024-12-31 DIAGNOSIS — Z952 Presence of prosthetic heart valve: Secondary | ICD-10-CM

## 2024-12-31 MED ORDER — WARFARIN SODIUM 5 MG PO TABS: ORAL_TABLET | ORAL | 1 refills | Status: AC

## 2024-12-31 NOTE — Telephone Encounter (Signed)
 Warfarin 5mg  Dx-H/O mitral valve replacement with mechanical valve  Last INR Check-11/28/24 Last OV- 07/03/24

## 2025-01-10 ENCOUNTER — Ambulatory Visit: Payer: Self-pay

## 2025-01-16 ENCOUNTER — Ambulatory Visit: Payer: Managed Care, Other (non HMO) | Admitting: Neurology

## 2025-01-22 ENCOUNTER — Ambulatory Visit: Admitting: Podiatry
